# Patient Record
Sex: Male | Born: 1940
Health system: Southern US, Community
[De-identification: ages and names within clinical notes are randomized; demographics above are authoritative.]

## PROBLEM LIST (undated history)

## (undated) DIAGNOSIS — C44300 Unspecified malignant neoplasm of skin of unspecified part of face: Secondary | ICD-10-CM

## (undated) DIAGNOSIS — W3400XA Accidental discharge from unspecified firearms or gun, initial encounter: Secondary | ICD-10-CM

## (undated) DIAGNOSIS — M1990 Unspecified osteoarthritis, unspecified site: Secondary | ICD-10-CM

## (undated) DIAGNOSIS — I6522 Occlusion and stenosis of left carotid artery: Secondary | ICD-10-CM

## (undated) DIAGNOSIS — E119 Type 2 diabetes mellitus without complications: Secondary | ICD-10-CM

## (undated) DIAGNOSIS — L0390 Cellulitis, unspecified: Secondary | ICD-10-CM

## (undated) DIAGNOSIS — E785 Hyperlipidemia, unspecified: Secondary | ICD-10-CM

## (undated) DIAGNOSIS — M79643 Pain in unspecified hand: Secondary | ICD-10-CM

## (undated) DIAGNOSIS — G8929 Other chronic pain: Secondary | ICD-10-CM

## (undated) DIAGNOSIS — I10 Essential (primary) hypertension: Secondary | ICD-10-CM

## (undated) DIAGNOSIS — F419 Anxiety disorder, unspecified: Secondary | ICD-10-CM

## (undated) DIAGNOSIS — K219 Gastro-esophageal reflux disease without esophagitis: Secondary | ICD-10-CM

## (undated) DIAGNOSIS — K635 Polyp of colon: Secondary | ICD-10-CM

## (undated) HISTORY — PX: SKIN CANCER EXCISION: SHX779

## (undated) HISTORY — DX: Polyp of colon: K63.5

## (undated) HISTORY — PX: TONSILLECTOMY: SUR1361

## (undated) HISTORY — DX: Anxiety disorder, unspecified: F41.9

---

## 1979-01-13 HISTORY — PX: FINGER AMPUTATION: SHX636

## 1979-01-13 HISTORY — PX: HAND SURGERY: SHX662

## 1996-01-13 DIAGNOSIS — I10 Essential (primary) hypertension: Secondary | ICD-10-CM

## 1996-01-13 DIAGNOSIS — E785 Hyperlipidemia, unspecified: Secondary | ICD-10-CM

## 1996-01-13 HISTORY — DX: Essential (primary) hypertension: I10

## 1996-01-13 HISTORY — DX: Hyperlipidemia, unspecified: E78.5

## 1997-09-21 ENCOUNTER — Encounter: Admission: RE | Admit: 1997-09-21 | Discharge: 1997-12-20 | Payer: Self-pay | Admitting: Endocrinology

## 1998-09-05 ENCOUNTER — Emergency Department (HOSPITAL_COMMUNITY): Admission: EM | Admit: 1998-09-05 | Discharge: 1998-09-05 | Payer: Self-pay

## 1998-11-01 ENCOUNTER — Emergency Department (HOSPITAL_COMMUNITY): Admission: EM | Admit: 1998-11-01 | Discharge: 1998-11-01 | Payer: Self-pay | Admitting: Emergency Medicine

## 1998-11-02 ENCOUNTER — Encounter: Payer: Self-pay | Admitting: Emergency Medicine

## 2002-10-09 ENCOUNTER — Emergency Department (HOSPITAL_COMMUNITY): Admission: EM | Admit: 2002-10-09 | Discharge: 2002-10-09 | Payer: Self-pay | Admitting: Emergency Medicine

## 2006-12-14 ENCOUNTER — Emergency Department (HOSPITAL_COMMUNITY): Admission: EM | Admit: 2006-12-14 | Discharge: 2006-12-14 | Payer: Self-pay | Admitting: Emergency Medicine

## 2010-02-20 ENCOUNTER — Emergency Department (HOSPITAL_COMMUNITY): Payer: Medicare HMO

## 2010-02-20 ENCOUNTER — Emergency Department (HOSPITAL_COMMUNITY)
Admission: EM | Admit: 2010-02-20 | Discharge: 2010-02-20 | Disposition: A | Discharge status: Home / Self Care | Payer: Medicare HMO | Attending: Emergency Medicine | Admitting: Emergency Medicine

## 2010-02-20 DIAGNOSIS — Z79899 Other long term (current) drug therapy: Secondary | ICD-10-CM | POA: Insufficient documentation

## 2010-02-20 DIAGNOSIS — E119 Type 2 diabetes mellitus without complications: Secondary | ICD-10-CM | POA: Insufficient documentation

## 2010-02-20 DIAGNOSIS — M7989 Other specified soft tissue disorders: Secondary | ICD-10-CM | POA: Insufficient documentation

## 2010-02-20 DIAGNOSIS — M79609 Pain in unspecified limb: Secondary | ICD-10-CM | POA: Insufficient documentation

## 2010-08-03 ENCOUNTER — Emergency Department (HOSPITAL_COMMUNITY)
Admission: EM | Admit: 2010-08-03 | Discharge: 2010-08-03 | Disposition: A | Discharge status: Home / Self Care | Payer: Medicare HMO | Attending: Emergency Medicine | Admitting: Emergency Medicine

## 2010-08-03 ENCOUNTER — Encounter: Payer: Self-pay | Admitting: *Deleted

## 2010-08-03 DIAGNOSIS — L02619 Cutaneous abscess of unspecified foot: Secondary | ICD-10-CM | POA: Insufficient documentation

## 2010-08-03 DIAGNOSIS — L03116 Cellulitis of left lower limb: Secondary | ICD-10-CM

## 2010-08-03 DIAGNOSIS — I10 Essential (primary) hypertension: Secondary | ICD-10-CM | POA: Insufficient documentation

## 2010-08-03 DIAGNOSIS — E119 Type 2 diabetes mellitus without complications: Secondary | ICD-10-CM | POA: Insufficient documentation

## 2010-08-03 DIAGNOSIS — F17200 Nicotine dependence, unspecified, uncomplicated: Secondary | ICD-10-CM | POA: Insufficient documentation

## 2010-08-03 HISTORY — DX: Accidental discharge from unspecified firearms or gun, initial encounter: W34.00XA

## 2010-08-03 HISTORY — DX: Essential (primary) hypertension: I10

## 2010-08-03 MED ORDER — DOXYCYCLINE HYCLATE 100 MG PO TABS
100.0000 mg | ORAL_TABLET | Freq: Once | ORAL | Status: AC
  Administered 2010-08-03: 100 mg via ORAL
  Filled 2010-08-03: qty 1

## 2010-08-03 MED ORDER — DOXYCYCLINE HYCLATE 100 MG PO TABS: 100.0000 mg | ORAL_TABLET | Freq: Two times a day (BID) | ORAL | Status: DC
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
## 2010-08-03 NOTE — ED Notes (Signed)
Pt c/o swelling, redness and pain to left foot x 2 days; pt states he is a diabetic and needs to have it checked out

## 2010-08-09 ENCOUNTER — Inpatient Hospital Stay (HOSPITAL_COMMUNITY)
Admission: EM | Admit: 2010-08-09 | Discharge: 2010-08-11 | DRG: 603 | Disposition: A | Discharge status: Home Health | Payer: Medicare HMO | Attending: Internal Medicine | Admitting: Internal Medicine

## 2010-08-09 ENCOUNTER — Emergency Department (HOSPITAL_COMMUNITY): Payer: Medicare HMO

## 2010-08-09 ENCOUNTER — Encounter (HOSPITAL_COMMUNITY): Payer: Self-pay | Admitting: Emergency Medicine

## 2010-08-09 DIAGNOSIS — F17200 Nicotine dependence, unspecified, uncomplicated: Secondary | ICD-10-CM | POA: Diagnosis present

## 2010-08-09 DIAGNOSIS — E785 Hyperlipidemia, unspecified: Secondary | ICD-10-CM | POA: Diagnosis present

## 2010-08-09 DIAGNOSIS — L02619 Cutaneous abscess of unspecified foot: Principal | ICD-10-CM | POA: Diagnosis present

## 2010-08-09 DIAGNOSIS — L0390 Cellulitis, unspecified: Secondary | ICD-10-CM

## 2010-08-09 DIAGNOSIS — L03116 Cellulitis of left lower limb: Secondary | ICD-10-CM

## 2010-08-09 DIAGNOSIS — I10 Essential (primary) hypertension: Secondary | ICD-10-CM | POA: Diagnosis present

## 2010-08-09 DIAGNOSIS — D72829 Elevated white blood cell count, unspecified: Secondary | ICD-10-CM | POA: Diagnosis present

## 2010-08-09 DIAGNOSIS — E119 Type 2 diabetes mellitus without complications: Secondary | ICD-10-CM | POA: Diagnosis present

## 2010-08-09 HISTORY — DX: Hyperlipidemia, unspecified: E78.5

## 2010-08-09 LAB — BASIC METABOLIC PANEL
BUN: 18 mg/dL (ref 6–23)
CO2: 27 mEq/L (ref 19–32)
Calcium: 10.1 mg/dL (ref 8.4–10.5)
GFR calc non Af Amer: >60 mL/min (ref >60)
Glucose, Bld: 187 mg/dL — ABNORMAL HIGH (ref 70–99)
Potassium: 4.2 mEq/L (ref 3.5–5.1)
Sodium: 137 mEq/L (ref 135–145)

## 2010-08-09 LAB — CBC
HCT: 37.4 % — ABNORMAL LOW (ref 39.0–52.0)
Hemoglobin: 12.5 g/dL — ABNORMAL LOW (ref 13.0–17.0)
MCH: 30.9 pg (ref 26.0–34.0)
MCHC: 33.4 g/dL (ref 30.0–36.0)
RBC: 4.05 MIL/uL — ABNORMAL LOW (ref 4.22–5.81)

## 2010-08-09 LAB — GLUCOSE, CAPILLARY: Glucose-Capillary: 109 mg/dL — ABNORMAL HIGH (ref 70–99)

## 2010-08-09 MED ORDER — INSULIN ASPART 100 UNIT/ML ~~LOC~~ SOLN: 0.0000 [IU] | Freq: Three times a day (TID) | SUBCUTANEOUS | Status: DC

## 2010-08-09 MED ORDER — SODIUM CHLORIDE 0.9 % IV SOLN: INTRAVENOUS | Status: DC

## 2010-08-09 MED ORDER — SENNOSIDES-DOCUSATE SODIUM 8.6-50 MG PO TABS: 1.0000 | ORAL_TABLET | Freq: Every day | ORAL | Status: DC | PRN

## 2010-08-09 MED ORDER — ACETAMINOPHEN 325 MG PO TABS: 650.0000 mg | ORAL_TABLET | Freq: Four times a day (QID) | ORAL | Status: DC | PRN

## 2010-08-09 MED ORDER — ACETAMINOPHEN 650 MG RE SUPP: 650.0000 mg | Freq: Four times a day (QID) | RECTAL | Status: DC | PRN

## 2010-08-09 MED ORDER — VANCOMYCIN HCL IN DEXTROSE 1-5 GM/200ML-% IV SOLN
INTRAVENOUS | Status: AC
  Filled 2010-08-09: qty 200

## 2010-08-09 MED ORDER — PIPERACILLIN-TAZOBACTAM 3.375 G IVPB
3.3750 g | Freq: Once | INTRAVENOUS | Status: AC
  Administered 2010-08-09: 3.375 g via INTRAVENOUS
  Filled 2010-08-09: qty 50

## 2010-08-09 MED ORDER — OXYCODONE-ACETAMINOPHEN 5-325 MG PO TABS
2.0000 | ORAL_TABLET | Freq: Once | ORAL | Status: AC
  Administered 2010-08-09: 2 via ORAL
  Filled 2010-08-09: qty 2

## 2010-08-09 MED ORDER — MORPHINE SULFATE 2 MG/ML IJ SOLN: 1.0000 mg | INTRAMUSCULAR | Status: DC | PRN

## 2010-08-09 MED ORDER — AMPICILLIN-SULBACTAM SODIUM 3 (2-1) G IJ SOLR
3.0000 g | Freq: Four times a day (QID) | INTRAMUSCULAR | Status: DC
  Administered 2010-08-09 – 2010-08-10 (×2): 3 g via INTRAVENOUS
  Filled 2010-08-09: qty 3

## 2010-08-09 MED ORDER — ONDANSETRON HCL 4 MG/2ML IJ SOLN: 4.0000 mg | Freq: Four times a day (QID) | INTRAMUSCULAR | Status: DC | PRN

## 2010-08-09 MED ORDER — SODIUM CHLORIDE 0.9 % IJ SOLN
3.0000 mL | INTRAMUSCULAR | Status: DC | PRN
  Administered 2010-08-10: 3 mL via INTRAVENOUS

## 2010-08-09 MED ORDER — SODIUM CHLORIDE 0.9 % IV SOLN: 250.0000 mL | INTRAVENOUS | Status: DC

## 2010-08-09 MED ORDER — SODIUM CHLORIDE 0.9 % IV SOLN
INTRAVENOUS | Status: AC
  Filled 2010-08-09 (×2): qty 3

## 2010-08-09 MED ORDER — VANCOMYCIN HCL IN DEXTROSE 1-5 GM/200ML-% IV SOLN
1000.0000 mg | Freq: Two times a day (BID) | INTRAVENOUS | Status: DC
  Administered 2010-08-09 – 2010-08-11 (×4): 1000 mg via INTRAVENOUS
  Filled 2010-08-09 (×3): qty 200

## 2010-08-09 MED ORDER — ENOXAPARIN SODIUM 40 MG/0.4ML ~~LOC~~ SOLN
40.0000 mg | Freq: Every day | SUBCUTANEOUS | Status: DC
  Administered 2010-08-09 – 2010-08-10 (×2): 40 mg via SUBCUTANEOUS
  Filled 2010-08-09 (×2): qty 0.4

## 2010-08-09 MED ORDER — FENOFIBRATE 160 MG PO TABS
160.0000 mg | ORAL_TABLET | Freq: Every day | ORAL | Status: DC
  Administered 2010-08-09: 160 mg via ORAL
  Filled 2010-08-09 (×3): qty 1

## 2010-08-09 MED ORDER — PIOGLITAZONE HCL 30 MG PO TABS
45.0000 mg | ORAL_TABLET | Freq: Every day | ORAL | Status: DC
  Administered 2010-08-09: 45 mg via ORAL
  Filled 2010-08-09 (×2): qty 2

## 2010-08-09 MED ORDER — SITAGLIPTIN PHOSPHATE 50 MG PO TABS
50.0000 mg | ORAL_TABLET | Freq: Two times a day (BID) | ORAL | Status: DC
  Administered 2010-08-09 – 2010-08-11 (×3): 50 mg via ORAL
  Filled 2010-08-09 (×9): qty 1

## 2010-08-09 MED ORDER — GLIPIZIDE ER 5 MG PO TB24
5.0000 mg | ORAL_TABLET | Freq: Every day | ORAL | Status: DC
  Administered 2010-08-09: 5 mg via ORAL
  Filled 2010-08-09 (×2): qty 1

## 2010-08-09 MED ORDER — LOSARTAN POTASSIUM 50 MG PO TABS
50.0000 mg | ORAL_TABLET | Freq: Every day | ORAL | Status: DC
  Administered 2010-08-09: 50 mg via ORAL
  Filled 2010-08-09 (×2): qty 1

## 2010-08-09 MED ORDER — VANCOMYCIN HCL IN DEXTROSE 1-5 GM/200ML-% IV SOLN
1000.0000 mg | Freq: Once | INTRAVENOUS | Status: AC
  Administered 2010-08-09: 1000 mg via INTRAVENOUS
  Filled 2010-08-09: qty 200

## 2010-08-09 MED ORDER — ONDANSETRON HCL 4 MG PO TABS: 4.0000 mg | ORAL_TABLET | Freq: Four times a day (QID) | ORAL | Status: DC | PRN

## 2010-08-09 MED ORDER — IOHEXOL 300 MG/ML  SOLN
100.0000 mL | Freq: Once | INTRAMUSCULAR | Status: AC | PRN
  Administered 2010-08-09: 100 mL via INTRAVENOUS

## 2010-08-09 MED ORDER — ASPIRIN 81 MG PO CHEW
81.0000 mg | CHEWABLE_TABLET | Freq: Every day | ORAL | Status: DC
  Administered 2010-08-10 – 2010-08-11 (×2): 81 mg via ORAL
  Filled 2010-08-09 (×2): qty 1

## 2010-08-09 MED ORDER — HYDROCODONE-ACETAMINOPHEN 5-325 MG PO TABS
1.0000 | ORAL_TABLET | Freq: Four times a day (QID) | ORAL | Status: DC | PRN
  Administered 2010-08-09 – 2010-08-10 (×2): 1 via ORAL
  Filled 2010-08-09 (×2): qty 1

## 2010-08-09 MED ORDER — SODIUM CHLORIDE 0.9 % IJ SOLN
3.0000 mL | Freq: Two times a day (BID) | INTRAMUSCULAR | Status: DC
  Administered 2010-08-09 – 2010-08-11 (×5): 3 mL via INTRAVENOUS
  Administered 2010-08-11: 12:00:00 via INTRAVENOUS
  Filled 2010-08-09 (×4): qty 3

## 2010-08-09 MED ORDER — ONDANSETRON HCL 4 MG PO TABS
4.0000 mg | ORAL_TABLET | Freq: Once | ORAL | Status: AC
  Administered 2010-08-09: 4 mg via ORAL
  Filled 2010-08-09: qty 1
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
## 2010-08-09 NOTE — ED Notes (Signed)
3rd floor called to give report on said pt.

## 2010-08-09 NOTE — ED Notes (Signed)
Lunch tray ordered for pt.

## 2010-08-09 NOTE — Progress Notes (Signed)
Pt seen with me by Dr Shela Commons. Case discussed with hospitalist.

## 2010-08-09 NOTE — ED Notes (Signed)
L foot with ? Insect bite that caused cellulitis. In ED on last Sunday.
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
## 2010-08-09 NOTE — Progress Notes (Signed)
Pt given care notes on PNA vaccine, cellulitis, and smoking cessation.  Pt also refuses to take insulin- only wants to take PO meds for diabetes.  Glucose was 222 tonight, pt aware but still refuses. PNA vaccine screening done with pt alone in room.  Pt states he has not had the PNA vaccine and does not want it.  Later on pts wife came in and said that he had the PNA vaccine 2 years ago.  Pt disagrees. Unsure of PNA vaccine status, but pt still refuses to have vaccine given while here.

## 2010-08-10 LAB — CBC
Hemoglobin: 11.3 g/dL — ABNORMAL LOW (ref 13.0–17.0)
MCH: 30.9 pg (ref 26.0–34.0)
Platelets: 246 10*3/uL (ref 150–400)
RBC: 3.66 MIL/uL — ABNORMAL LOW (ref 4.22–5.81)
WBC: 8.2 10*3/uL (ref 4.0–10.5)

## 2010-08-10 LAB — BASIC METABOLIC PANEL
Calcium: 9.1 mg/dL (ref 8.4–10.5)
GFR calc Af Amer: >60 mL/min (ref >60)
GFR calc non Af Amer: >60 mL/min (ref >60)
Glucose, Bld: 158 mg/dL — ABNORMAL HIGH (ref 70–99)
Potassium: 4.1 mEq/L (ref 3.5–5.1)
Sodium: 135 mEq/L (ref 135–145)

## 2010-08-10 LAB — HEMOGLOBIN A1C
Hgb A1c MFr Bld: 6.5 % — ABNORMAL HIGH (ref <5.7)
Mean Plasma Glucose: 140 mg/dL — ABNORMAL HIGH (ref <117)

## 2010-08-10 LAB — GLUCOSE, CAPILLARY
Glucose-Capillary: 225 mg/dL — ABNORMAL HIGH (ref 70–99)
Glucose-Capillary: 150 mg/dL — ABNORMAL HIGH (ref 70–99)

## 2010-08-10 MED ORDER — FENOFIBRATE 160 MG PO TABS: 160.0000 mg | ORAL_TABLET | Freq: Every day | ORAL | Status: DC

## 2010-08-10 MED ORDER — PIOGLITAZONE HCL 30 MG PO TABS
45.0000 mg | ORAL_TABLET | ORAL | Status: DC
  Administered 2010-08-10: 45 mg via ORAL

## 2010-08-10 MED ORDER — NICOTINE 14 MG/24HR TD PT24
14.0000 mg | MEDICATED_PATCH | Freq: Every day | TRANSDERMAL | Status: DC | PRN
  Administered 2010-08-10: 14 mg via TRANSDERMAL
  Filled 2010-08-10: qty 1

## 2010-08-10 MED ORDER — OXYCODONE HCL 5 MG PO TABS
5.0000 mg | ORAL_TABLET | Freq: Four times a day (QID) | ORAL | Status: DC | PRN
  Administered 2010-08-10 – 2010-08-11 (×5): 5 mg via ORAL
  Filled 2010-08-10 (×5): qty 1

## 2010-08-10 MED ORDER — DEXTROSE 5 % IV SOLN
1.0000 g | INTRAVENOUS | Status: DC
  Administered 2010-08-10 – 2010-08-11 (×2): 1 g via INTRAVENOUS
  Filled 2010-08-10 (×3): qty 1

## 2010-08-10 MED ORDER — SODIUM CHLORIDE 0.9 % IJ SOLN
INTRAMUSCULAR | Status: AC
  Administered 2010-08-10: 3 mL via INTRAVENOUS
  Filled 2010-08-10: qty 3

## 2010-08-10 MED ORDER — SODIUM CHLORIDE 0.9 % IJ SOLN
INTRAMUSCULAR | Status: AC
  Administered 2010-08-10: 3 mL
  Filled 2010-08-10: qty 10

## 2010-08-10 MED ORDER — FENOFIBRATE 160 MG PO TABS
160.0000 mg | ORAL_TABLET | ORAL | Status: DC
  Administered 2010-08-10: 160 mg via ORAL
  Filled 2010-08-10 (×3): qty 1

## 2010-08-10 MED ORDER — LOSARTAN POTASSIUM 50 MG PO TABS
50.0000 mg | ORAL_TABLET | ORAL | Status: DC
  Administered 2010-08-10: 50 mg via ORAL

## 2010-08-10 MED ORDER — GLIPIZIDE ER 5 MG PO TB24
5.0000 mg | ORAL_TABLET | ORAL | Status: DC
  Administered 2010-08-10: 5 mg via ORAL

## 2010-08-10 NOTE — Progress Notes (Signed)
Subjective: Feeling better today. Redness in his left foot improved today. Pain improved in his left foot.  Objective: Vital signs in last 24 hours: Filed Vitals:   08/09/10 0928 08/09/10 1500 08/09/10 2200 08/10/10 0630  BP: 133/64 136/72 127/62 121/72  Pulse: 101 87 78 69  Temp: 97.7 F (36.5 C) 98.1 F (36.7 C) 98 F (36.7 C) 97.7 F (36.5 C)  TempSrc: Oral Oral Oral Oral  Resp: 16 18 20 20   Height: 5\' 11"  (1.803 m) 5\' 11"  (1.803 m)    Weight: 69.4 kg (153 lb) 70.1 kg (154 lb 8.7 oz)  71.4 kg (157 lb 6.5 oz)  SpO2: 100%  90% 97%   Weight change:   Intake/Output Summary (Last 24 hours) at 08/10/10 0804 Last data filed at 08/10/10 0600  Gross per 24 hour  Intake    483 ml  Output   1900 ml  Net  -1417 ml   Physical Exam: General: Awake, Oriented, No acute distress. HEENT: EOMI. Neck: Supple CV: S1 and S2 Lungs: Clear to ascultation bilaterally Abdomen: Soft, Nontender, Nondistended, +bowel sounds. Ext: Good pulses. Trace edema. Erythema improved over the dorsal foot.    Lab Results:  Basename 08/10/10 0433 08/09/10 1035  NA 135 137  K 4.1 4.2  CL 102 101  CO2 21 27  GLUCOSE 158* 187*  BUN 15 18  CREATININE 0.90 0.92  CALCIUM 9.1 10.1  MG -- --  PHOS -- --   No results found for this basename: AST:2,ALT:2,ALKPHOS:2,BILITOT:2,PROT:2,ALBUMIN:2 in the last 72 hours No results found for this basename: LIPASE:2,AMYLASE:2 in the last 72 hours  Basename 08/10/10 0433 08/09/10 1035  WBC 8.2 10.9*  NEUTROABS -- --  HGB 11.3* 12.5*  HCT 33.5* 37.4*  MCV 91.5 92.3  PLT 246 254   No results found for this basename: CKTOTAL:3,CKMB:3,CKMBINDEX:3,TROPONINI:3 in the last 72 hours No results found for this basename: POCBNP:3 in the last 72 hours No results found for this basename: DDIMER:2 in the last 72 hours No results found for this basename: HGBA1C:2 in the last 72 hours No results found for this basename: CHOL:2,HDL:2,LDLCALC:2,TRIG:2,CHOLHDL:2,LDLDIRECT:2 in  the last 72 hours No results found for this basename: TSH,T4TOTAL,FREET3,T3FREE,THYROIDAB in the last 72 hours No results found for this basename: VITAMINB12:2,FOLATE:2,FERRITIN:2,TIBC:2,IRON:2,RETICCTPCT:2 in the last 72 hours Micro Results: Recent Results (from the past 240 hour(s))  CULTURE, BLOOD (ROUTINE X 2)     Status: Normal (Preliminary result)   Collection Time   08/09/10  1:19 PM      Component Value Range Status Comment   Specimen Description BLOOD RIGHT HAND   Final    Special Requests     Final    Value: BOTTLES DRAWN AEROBIC AND ANAEROBIC 12CC EACH BOTTLE   Culture NO GROWTH 1 DAY   Final    Report Status PENDING   Incomplete   CULTURE, BLOOD (ROUTINE X 2)     Status: Normal (Preliminary result)   Collection Time   08/09/10  1:24 PM      Component Value Range Status Comment   Specimen Description BLOOD RIGHT ANTECUBITAL   Final    Special Requests     Final    Value: BOTTLES DRAWN AEROBIC AND ANAEROBIC 15CC EACH BOTTLE   Culture NO GROWTH 1 DAY   Final    Report Status PENDING   Incomplete    Studies/Results: Ct Foot Left W Contrast  08/09/2010  *RADIOLOGY REPORT*  Clinical Data: Cellulitis.  Redness and swelling.  CT OF THE LEFT  FOOT WITH CONTRAST  Technique:  Multidetector CT imaging was performed following the standard protocol during bolus administration of intravenous contrast.  Contrast: 100 ml Omnipaque-300  Comparison: Radiographs dated 08/09/2010  Findings: There is extensive soft tissue edema circumferentially at the ankle joint and extending primarily onto the dorsum of the foot.  There are no discrete soft tissue abscesses.  The underlying muscle structures are normal.  The osseous structures are normal.  No visible joint effusions.  IMPRESSION: Diffuse subcutaneous edema consistent with cellulitis.  No evidence of soft tissue abscesses, osteomyelitis, or myositis.  Original Report Authenticated By: Gwynn Burly, M.D.   Dg Foot Complete Left  08/09/2010   *RADIOLOGY REPORT*  Clinical Data: Pain.  Swelling.  Cellulitis.  LEFT FOOT - COMPLETE 3+ VIEW  Comparison: None  Findings: There is dorsal soft tissue swelling.  No radiopaque foreign object.  No osseous or articular abnormality.  IMPRESSION: Soft tissue swelling dorsally.  No other abnormality otherwise.  Original Report Authenticated By: Thomasenia Sales, M.D.   Medications: I have reviewed the patient's current medications. Scheduled Meds:   . aspirin  81 mg Oral Daily  . cefTRIAXone (ROCEPHIN) IV  1 g Intravenous Q24H  . enoxaparin  40 mg Subcutaneous Daily  . fenofibrate  160 mg Oral Daily  . glipiZIDE  5 mg Oral Daily  . insulin aspart  0-9 Units Subcutaneous TID WC  . losartan  50 mg Oral Daily  . ondansetron  4 mg Oral Once  . oxyCODONE-acetaminophen  2 tablet Oral Once  . pioglitazone  45 mg Oral Daily  . piperacillin-tazobactam  3.375 g Intravenous Once  . sitaGLIPtin  50 mg Oral BID  . sodium chloride  3 mL Intravenous Q12H  . sodium chloride      . vancomycin  1,000 mg Intravenous Once  . vancomycin  1,000 mg Intravenous Q12H  . DISCONTD: ampicillin-sulbactam (UNASYN) IV  3 g Intravenous Q6H   Continuous Infusions:   . sodium chloride    . DISCONTD: sodium chloride     PRN Meds:.acetaminophen, acetaminophen, iohexol, morphine, nicotine, ondansetron (ZOFRAN) IV, ondansetron, oxyCODONE, senna-docusate, sodium chloride, DISCONTD: HYDROcodone-acetaminophen Assessment/Plan: 1. Left dorsal foot cellulitis. Continue vancomycin. Unasyn changed to ceftriaxone. IV antibiotics since 08/09/2010. CT of Left foot as indicated above does not show any evidence of soft tissue abscesses, osteomyelitis, or myositis. Ordered PICC line placement. 2. Hypertension stable continue home medications at this time.  3. Type 2 diabetes continue home medications except metformin. On sliding scale insulin. Blood sugar stable. 4. Hyperlipidemia. Continue the patient on home medications.  5. Leukocytosis.  Likely due to cellulitis. Result. 6. Prophylaxis Lovenox for DVT prophylaxis.  7. CODE STATUS patient is full code. 8. Disposition. Consider possible discharge tomorrow after home IV antibiotics are arranged after PICC line is placed.   LOS: 1 day   Rigel Filsinger A 08/10/2010, 8:04 AM
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
## 2010-08-11 ENCOUNTER — Inpatient Hospital Stay (HOSPITAL_COMMUNITY): Payer: Medicare HMO

## 2010-08-11 ENCOUNTER — Encounter (HOSPITAL_COMMUNITY): Payer: Medicare HMO

## 2010-08-11 LAB — GLUCOSE, CAPILLARY
Glucose-Capillary: 157 mg/dL — ABNORMAL HIGH (ref 70–99)
Glucose-Capillary: 240 mg/dL — ABNORMAL HIGH (ref 70–99)

## 2010-08-11 LAB — VANCOMYCIN, TROUGH: Vancomycin Tr: 18.8 ug/mL (ref 10.0–20.0)

## 2010-08-11 MED ORDER — NICOTINE 14 MG/24HR TD PT24: 1.0000 | MEDICATED_PATCH | Freq: Every day | TRANSDERMAL | Status: AC | PRN

## 2010-08-11 MED ORDER — OXYCODONE HCL 5 MG PO TABS: 5.0000 mg | ORAL_TABLET | Freq: Four times a day (QID) | ORAL | Status: AC | PRN

## 2010-08-11 MED ORDER — HEPARIN SOD (PORK) LOCK FLUSH 100 UNIT/ML IV SOLN
250.0000 [IU] | Freq: Once | INTRAVENOUS | Status: AC
  Administered 2010-08-11: 12:00:00
  Filled 2010-08-11: qty 5

## 2010-08-11 MED ORDER — SODIUM CHLORIDE 0.9 % IJ SOLN: 10.0000 mL | INTRAMUSCULAR | Status: DC | PRN

## 2010-08-11 MED ORDER — VANCOMYCIN HCL IN DEXTROSE 1-5 GM/200ML-% IV SOLN
1000.0000 mg | Freq: Once | INTRAVENOUS | Status: DC
  Filled 2010-08-11: qty 200

## 2010-08-11 MED ORDER — SODIUM CHLORIDE 0.9 % IJ SOLN
10.0000 mL | Freq: Two times a day (BID) | INTRAMUSCULAR | Status: DC
  Administered 2010-08-10: 3 mL

## 2010-08-11 MED ORDER — VANCOMYCIN HCL IN DEXTROSE 1-5 GM/200ML-% IV SOLN: 1750.0000 mg | INTRAVENOUS | Status: DC

## 2010-08-11 MED ORDER — SODIUM CHLORIDE 0.9 % IJ SOLN
INTRAMUSCULAR | Status: AC
  Filled 2010-08-11: qty 10

## 2010-08-11 MED ORDER — VANCOMYCIN HCL IN DEXTROSE 1-5 GM/200ML-% IV SOLN: 1000.0000 mg | Freq: Two times a day (BID) | INTRAVENOUS | Status: DC

## 2010-08-11 NOTE — Discharge Summary (Addendum)
Discharge Summary  JAKIM DRAPEAU  MR#: [id number]  DOB:March 04, 1940  Date of Admission: 08/09/2010 Date of Discharge: 08/11/2010  Attending Physician:Malachi Suderman A  Patient's PCP: Dwana Melena, MD  Discharge Diagnoses: 1. Left dorsal foot cellulitis.  2. Hypertension.  3. Type 2 diabetes.  4. Hyperlipidemia.  5. Leukocytosis.  6. Tobacco use.  Discharge Medications Medication List  As of 08/11/2010 10:05 AM   START taking these medications         nicotine 14 mg/24hr patch   Commonly known as: NICODERM CQ - dosed in mg/24 hours   Place 1 patch onto the skin daily as needed (For smoking cessation.).      oxyCODONE 5 MG immediate release tablet   Commonly known as: Oxy IR/ROXICODONE   Take 1-2 tablets (5-10 mg total) by mouth every 6 (six) hours as needed.      vancomycin 1 GM/200ML Soln   Commonly known as: VANCOCIN   Inject 350 mLs (1,750 mg total) into the vein daily.         CONTINUE taking these medications         aspirin 81 MG tablet      fenofibrate 145 MG tablet   Commonly known as: TRICOR      glipiZIDE 5 MG 24 hr tablet   Commonly known as: GLUCOTROL XL      losartan 50 MG tablet   Commonly known as: COZAAR      pioglitazone 45 MG tablet   Commonly known as: ACTOS      sitaGLIPtan-metformin 50-1000 MG per tablet   Commonly known as: JANUMET          Where to get your medications    These are the prescriptions that you need to pick up.   You may get these medications from any pharmacy.         nicotine 14 mg/24hr patch         Information on where to get these meds is not yet available. Ask your nurse or doctor.         oxyCODONE 5 MG immediate release tablet   vancomycin 1 GM/200ML Soln            Hospital Course: 1. Left dorsal foot cellulitis. Usually the patient was started on vancomycin and Unasyn and also hospital stay Unasyn was discontinued and patient was transitioned to the ceftriaxone. IV antibiotics since 08/09/2010. CT of  Left foot does not show any evidence of soft tissue abscesses, osteomyelitis, or myositis.PICC line placed. Will continue only vancomycin after discharged today.  2. Hypertension stable continue home medications at this time.  3. Type 2 diabetes continue home medications resume metformin. On sliding scale insulin. Blood sugar stable.  4. Hyperlipidemia. Continue the patient on home medications.  5. Leukocytosis. Likely due to cellulitis. Result.  6. Prophylaxis Lovenox for DVT prophylaxis.  7. Tobacco use. At discharge will prescribe PRN nicotine patches. Encourage smoking cessation.  Day of Discharge BP 128/70  Pulse 82  Temp(Src) 98 F (36.7 C) (Oral)  Resp 18  Ht 5\' 11"  (1.803 m)  Wt 68.5 kg (151 lb 0.2 oz)  BMI 21.06 kg/m2  SpO2 98%   Results for orders placed during the hospital encounter of 08/09/10 (from the past 48 hour(s))  CBC     Status: Abnormal   Collection Time   08/09/10 10:35 AM      Component Value Range Comment   WBC 10.9 (*) 4.0 - 10.5 (K/uL)    RBC 4.05 (*)  4.22 - 5.81 (MIL/uL)    Hemoglobin 12.5 (*) 13.0 - 17.0 (g/dL)    HCT 60.4 (*) 54.0 - 52.0 (%)    MCV 92.3  78.0 - 100.0 (fL)    MCH 30.9  26.0 - 34.0 (pg)    MCHC 33.4  30.0 - 36.0 (g/dL)    RDW 98.1  19.1 - 47.8 (%)    Platelets 254  150 - 400 (K/uL)   BASIC METABOLIC PANEL     Status: Abnormal   Collection Time   08/09/10 10:35 AM      Component Value Range Comment   Sodium 137  135 - 145 (mEq/L)    Potassium 4.2  3.5 - 5.1 (mEq/L)    Chloride 101  96 - 112 (mEq/L)    CO2 27  19 - 32 (mEq/L)    Glucose, Bld 187 (*) 70 - 99 (mg/dL)    BUN 18  6 - 23 (mg/dL)    Creatinine, Ser 2.95  0.50 - 1.35 (mg/dL)    Calcium 62.1  8.4 - 10.5 (mg/dL)    GFR calc non Af Amer >60  >60 (mL/min)    GFR calc Af Amer >60  >60 (mL/min)   CULTURE, BLOOD (ROUTINE X 2)     Status: Normal (Preliminary result)   Collection Time   08/09/10  1:19 PM      Component Value Range Comment   Specimen Description BLOOD RIGHT  HAND      Special Requests        Value: BOTTLES DRAWN AEROBIC AND ANAEROBIC 12CC EACH BOTTLE   Culture NO GROWTH 1 DAY      Report Status PENDING     CULTURE, BLOOD (ROUTINE X 2)     Status: Normal (Preliminary result)   Collection Time   08/09/10  1:24 PM      Component Value Range Comment   Specimen Description BLOOD RIGHT ANTECUBITAL      Special Requests        Value: BOTTLES DRAWN AEROBIC AND ANAEROBIC 15CC EACH BOTTLE   Culture NO GROWTH 1 DAY      Report Status PENDING     GLUCOSE, CAPILLARY     Status: Abnormal   Collection Time   08/09/10  4:27 PM      Component Value Range Comment   Glucose-Capillary 222 (*) 70 - 99 (mg/dL)    Comment 1 Notify RN      Comment 2 Documented in Chart     GLUCOSE, CAPILLARY     Status: Abnormal   Collection Time   08/09/10 10:59 PM      Component Value Range Comment   Glucose-Capillary 109 (*) 70 - 99 (mg/dL)   BASIC METABOLIC PANEL     Status: Abnormal   Collection Time   08/10/10  4:33 AM      Component Value Range Comment   Sodium 135  135 - 145 (mEq/L)    Potassium 4.1  3.5 - 5.1 (mEq/L)    Chloride 102  96 - 112 (mEq/L)    CO2 21  19 - 32 (mEq/L)    Glucose, Bld 158 (*) 70 - 99 (mg/dL)    BUN 15  6 - 23 (mg/dL)    Creatinine, Ser 3.08  0.50 - 1.35 (mg/dL)    Calcium 9.1  8.4 - 10.5 (mg/dL)    GFR calc non Af Amer >60  >60 (mL/min)    GFR calc Af Amer >60  >60 (mL/min)  CBC     Status: Abnormal   Collection Time   08/10/10  4:33 AM      Component Value Range Comment   WBC 8.2  4.0 - 10.5 (K/uL)    RBC 3.66 (*) 4.22 - 5.81 (MIL/uL)    Hemoglobin 11.3 (*) 13.0 - 17.0 (g/dL)    HCT 40.9 (*) 81.1 - 52.0 (%)    MCV 91.5  78.0 - 100.0 (fL)    MCH 30.9  26.0 - 34.0 (pg)    MCHC 33.7  30.0 - 36.0 (g/dL)    RDW 91.4  78.2 - 95.6 (%)    Platelets 246  150 - 400 (K/uL)   HEMOGLOBIN A1C     Status: Abnormal   Collection Time   08/10/10  4:33 AM      Component Value Range Comment   Hemoglobin A1C 6.5 (*) <5.7 (%)    Mean Plasma  Glucose 140 (*) <117 (mg/dL)   GLUCOSE, CAPILLARY     Status: Abnormal   Collection Time   08/10/10  7:33 AM      Component Value Range Comment   Glucose-Capillary 225 (*) 70 - 99 (mg/dL)   GLUCOSE, CAPILLARY     Status: Abnormal   Collection Time   08/10/10 11:22 AM      Component Value Range Comment   Glucose-Capillary 150 (*) 70 - 99 (mg/dL)   GLUCOSE, CAPILLARY     Status: Abnormal   Collection Time   08/10/10  4:36 PM      Component Value Range Comment   Glucose-Capillary 262 (*) 70 - 99 (mg/dL)   GLUCOSE, CAPILLARY     Status: Abnormal   Collection Time   08/10/10  8:54 PM      Component Value Range Comment   Glucose-Capillary 182 (*) 70 - 99 (mg/dL)   VANCOMYCIN, TROUGH     Status: Normal   Collection Time   08/11/10  6:38 AM      Component Value Range Comment   Vancomycin Tr 18.8  10.0 - 20.0 (ug/mL)   GLUCOSE, CAPILLARY     Status: Abnormal   Collection Time   08/11/10  7:58 AM      Component Value Range Comment   Glucose-Capillary 157 (*) 70 - 99 (mg/dL)    Comment 1 Notify RN      Comment 2 Documented in Chart      Disposition: Discharge home with home health for IV antibiotics.  Diet: Modified diet.  Activity: Resume as tolerated.   Follow-up Appts: Discharge Orders    Future Appointments: Provider: Department: Dept Phone: Center:   08/11/2010 9:45 AM Ap-Medsurg Nurse Ap-Medsurg Day  None     Future Orders Please Complete By Expires   Diet Carb Modified      Increase activity slowly      Discharge instructions      Comments:   Please follow up with [facility], MD in 1 week.     Time spent on discharge, talking to the patient, and coordinating care: 40 minutes.  Signed: Burnice Vassel A 08/11/2010, 9:26 AM

## 2010-08-11 NOTE — Progress Notes (Signed)
UR Chart Review Completed  

## 2010-08-11 NOTE — Progress Notes (Signed)
Subjective: Feeling better today. Pain improved over the left dorsal foot today. No specific complaints.  Objective: Vital signs in last 24 hours: Filed Vitals:   08/10/10 0630 08/10/10 1344 08/10/10 2200 08/11/10 0623  BP: 121/72 135/66 116/56 128/70  Pulse: 69 76 72 82  Temp: 97.7 F (36.5 C) 98.4 F (36.9 C) 98.5 F (36.9 C) 98 F (36.7 C)  TempSrc: Oral Oral Oral Oral  Resp: 20 18 20 18   Height:      Weight: 71.4 kg (157 lb 6.5 oz)   68.5 kg (151 lb 0.2 oz)  SpO2: 97% 97% 97% 98%   Weight change: -0.9 kg (-1 lb 15.8 oz)  Intake/Output Summary (Last 24 hours) at 08/11/10 0856 Last data filed at 08/11/10 0000  Gross per 24 hour  Intake   1170 ml  Output   1250 ml  Net    -80 ml   Physical Exam: General: Awake, Oriented, No acute distress. HEENT: EOMI. Neck: Supple CV: S1 and S2 Lungs: Clear to ascultation bilaterally Abdomen: Soft, Nontender, Nondistended, +bowel sounds. Ext: Good pulses. Trace edema. Erythema improved over the dorsal foot.    Lab Results:  Basename 08/10/10 0433 08/09/10 1035  NA 135 137  K 4.1 4.2  CL 102 101  CO2 21 27  GLUCOSE 158* 187*  BUN 15 18  CREATININE 0.90 0.92  CALCIUM 9.1 10.1  MG -- --  PHOS -- --   No results found for this basename: AST:2,ALT:2,ALKPHOS:2,BILITOT:2,PROT:2,ALBUMIN:2 in the last 72 hours No results found for this basename: LIPASE:2,AMYLASE:2 in the last 72 hours  Basename 08/10/10 0433 08/09/10 1035  WBC 8.2 10.9*  NEUTROABS -- --  HGB 11.3* 12.5*  HCT 33.5* 37.4*  MCV 91.5 92.3  PLT 246 254   No results found for this basename: CKTOTAL:3,CKMB:3,CKMBINDEX:3,TROPONINI:3 in the last 72 hours No results found for this basename: POCBNP:3 in the last 72 hours No results found for this basename: DDIMER:2 in the last 72 hours  Basename 08/10/10 0433  HGBA1C 6.5*   No results found for this basename: CHOL:2,HDL:2,LDLCALC:2,TRIG:2,CHOLHDL:2,LDLDIRECT:2 in the last 72 hours No results found for this  basename: TSH,T4TOTAL,FREET3,T3FREE,THYROIDAB in the last 72 hours No results found for this basename: VITAMINB12:2,FOLATE:2,FERRITIN:2,TIBC:2,IRON:2,RETICCTPCT:2 in the last 72 hours Micro Results: Recent Results (from the past 240 hour(s))  CULTURE, BLOOD (ROUTINE X 2)     Status: Normal (Preliminary result)   Collection Time   08/09/10  1:19 PM      Component Value Range Status Comment   Specimen Description BLOOD RIGHT HAND   Final    Special Requests     Final    Value: BOTTLES DRAWN AEROBIC AND ANAEROBIC 12CC EACH BOTTLE   Culture NO GROWTH 1 DAY   Final    Report Status PENDING   Incomplete   CULTURE, BLOOD (ROUTINE X 2)     Status: Normal (Preliminary result)   Collection Time   08/09/10  1:24 PM      Component Value Range Status Comment   Specimen Description BLOOD RIGHT ANTECUBITAL   Final    Special Requests     Final    Value: BOTTLES DRAWN AEROBIC AND ANAEROBIC 15CC EACH BOTTLE   Culture NO GROWTH 1 DAY   Final    Report Status PENDING   Incomplete    Studies/Results: Ct Foot Left W Contrast  08/09/2010  *RADIOLOGY REPORT*  Clinical Data: Cellulitis.  Redness and swelling.  CT OF THE LEFT FOOT WITH CONTRAST  Technique:  Multidetector CT  imaging was performed following the standard protocol during bolus administration of intravenous contrast.  Contrast: 100 ml Omnipaque-300  Comparison: Radiographs dated 08/09/2010  Findings: There is extensive soft tissue edema circumferentially at the ankle joint and extending primarily onto the dorsum of the foot.  There are no discrete soft tissue abscesses.  The underlying muscle structures are normal.  The osseous structures are normal.  No visible joint effusions.  IMPRESSION: Diffuse subcutaneous edema consistent with cellulitis.  No evidence of soft tissue abscesses, osteomyelitis, or myositis.  Original Report Authenticated By: Gwynn Burly, M.D.   Dg Foot Complete Left  08/09/2010  *RADIOLOGY REPORT*  Clinical Data: Pain.   Swelling.  Cellulitis.  LEFT FOOT - COMPLETE 3+ VIEW  Comparison: None  Findings: There is dorsal soft tissue swelling.  No radiopaque foreign object.  No osseous or articular abnormality.  IMPRESSION: Soft tissue swelling dorsally.  No other abnormality otherwise.  Original Report Authenticated By: Thomasenia Sales, M.D.   Medications: I have reviewed the patient's current medications. Scheduled Meds:    . aspirin  81 mg Oral Daily  . cefTRIAXone (ROCEPHIN) IV  1 g Intravenous Q24H  . enoxaparin  40 mg Subcutaneous Daily  . fenofibrate  160 mg Oral Q24H  . glipiZIDE  5 mg Oral Q24H  . insulin aspart  0-9 Units Subcutaneous TID WC  . losartan  50 mg Oral Q24H  . pioglitazone  45 mg Oral Q24H  . sitaGLIPtin  50 mg Oral BID  . sodium chloride  3 mL Intravenous Q12H  . sodium chloride      . vancomycin  1,000 mg Intravenous Q12H  . DISCONTD: fenofibrate  160 mg Oral Daily  . DISCONTD: fenofibrate  160 mg Oral Daily  . DISCONTD: glipiZIDE  5 mg Oral Daily  . DISCONTD: losartan  50 mg Oral Daily  . DISCONTD: pioglitazone  45 mg Oral Daily   Continuous Infusions:    . sodium chloride     PRN Meds:.acetaminophen, acetaminophen, morphine, nicotine, ondansetron (ZOFRAN) IV, ondansetron, oxyCODONE, senna-docusate, sodium chloride Assessment/Plan: 1. Left dorsal foot cellulitis. Continue vancomycin. Unasyn changed to ceftriaxone. IV antibiotics since 08/09/2010. CT of Left foot as indicated above does not show any evidence of soft tissue abscesses, osteomyelitis, or myositis. Ordered PICC line placement. Will continue only vancomycin after discharged today. 2. Hypertension stable continue home medications at this time.  3. Type 2 diabetes continue home medications resume metformin. On sliding scale insulin. Blood sugar stable. 4. Hyperlipidemia. Continue the patient on home medications.  5. Leukocytosis. Likely due to cellulitis. Result. 6. Prophylaxis Lovenox for DVT prophylaxis.  7. CODE  STATUS patient is full code. 8. Disposition. Discharge today after home IV antibiotics are arranged after PICC line is placed.   LOS: 2 days   [name] [name] 08/11/2010, 8:56 AM
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
## 2010-08-11 NOTE — Progress Notes (Signed)
picc line flushed,states understanding of discharge.

## 2010-08-14 LAB — CULTURE, BLOOD (ROUTINE X 2): Culture: NO GROWTH

## 2010-08-18 NOTE — Progress Notes (Signed)
Encounter addended by: Ree Shay, RN on: 08/18/2010 12:43 PM<BR>     Documentation filed: Charges VN

## 2010-08-22 ENCOUNTER — Encounter (HOSPITAL_COMMUNITY): Payer: Self-pay | Admitting: *Deleted

## 2010-08-22 ENCOUNTER — Emergency Department (HOSPITAL_COMMUNITY)
Admission: EM | Admit: 2010-08-22 | Discharge: 2010-08-22 | Disposition: A | Discharge status: Home / Self Care | Payer: Medicare HMO | Attending: Emergency Medicine | Admitting: Emergency Medicine

## 2010-08-22 DIAGNOSIS — I10 Essential (primary) hypertension: Secondary | ICD-10-CM | POA: Insufficient documentation

## 2010-08-22 DIAGNOSIS — F17200 Nicotine dependence, unspecified, uncomplicated: Secondary | ICD-10-CM | POA: Insufficient documentation

## 2010-08-22 DIAGNOSIS — Z452 Encounter for adjustment and management of vascular access device: Secondary | ICD-10-CM

## 2010-08-22 DIAGNOSIS — E119 Type 2 diabetes mellitus without complications: Secondary | ICD-10-CM | POA: Insufficient documentation

## 2010-08-22 DIAGNOSIS — Z79899 Other long term (current) drug therapy: Secondary | ICD-10-CM | POA: Insufficient documentation
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
## 2010-08-22 NOTE — ED Notes (Signed)
Pt here with Rx copy from Dr. Margo Aye to have PICC line removed.  Denies pain or injury to area.  States it's ready to come out.

## 2010-08-22 NOTE — ED Notes (Signed)
Pt has been assessed and treated by EDP prior to nurse to room. Pt now up for discharge.

## 2010-09-11 NOTE — Progress Notes (Signed)
Encounter addended by: Clarene Critchley on: 09/11/2010  7:59 AM<BR>     Documentation filed: Flowsheet VN

## 2011-06-10 ENCOUNTER — Encounter (HOSPITAL_COMMUNITY): Payer: Self-pay | Admitting: Emergency Medicine

## 2011-06-10 ENCOUNTER — Emergency Department (HOSPITAL_COMMUNITY)
Admission: EM | Admit: 2011-06-10 | Discharge: 2011-06-10 | Disposition: A | Discharge status: Home / Self Care | Payer: Medicare Other | Attending: Emergency Medicine | Admitting: Emergency Medicine

## 2011-06-10 ENCOUNTER — Emergency Department (HOSPITAL_COMMUNITY): Payer: Medicare Other

## 2011-06-10 DIAGNOSIS — I10 Essential (primary) hypertension: Secondary | ICD-10-CM | POA: Insufficient documentation

## 2011-06-10 DIAGNOSIS — R002 Palpitations: Secondary | ICD-10-CM | POA: Insufficient documentation

## 2011-06-10 DIAGNOSIS — F411 Generalized anxiety disorder: Secondary | ICD-10-CM | POA: Insufficient documentation

## 2011-06-10 DIAGNOSIS — E119 Type 2 diabetes mellitus without complications: Secondary | ICD-10-CM | POA: Insufficient documentation

## 2011-06-10 DIAGNOSIS — R5383 Other fatigue: Secondary | ICD-10-CM | POA: Insufficient documentation

## 2011-06-10 DIAGNOSIS — R5381 Other malaise: Secondary | ICD-10-CM | POA: Insufficient documentation

## 2011-06-10 DIAGNOSIS — E785 Hyperlipidemia, unspecified: Secondary | ICD-10-CM | POA: Insufficient documentation

## 2011-06-10 DIAGNOSIS — F419 Anxiety disorder, unspecified: Secondary | ICD-10-CM

## 2011-06-10 LAB — BASIC METABOLIC PANEL
BUN: 21 mg/dL (ref 6–23)
CO2: 25 mEq/L (ref 19–32)
Calcium: 9.5 mg/dL (ref 8.4–10.5)
Creatinine, Ser: 1.12 mg/dL (ref 0.50–1.35)
Glucose, Bld: 131 mg/dL — ABNORMAL HIGH (ref 70–99)

## 2011-06-10 LAB — DIFFERENTIAL
Basophils Absolute: 0.1 10*3/uL (ref 0.0–0.1)
Eosinophils Relative: 2 % (ref 0–5)
Lymphocytes Relative: 31 % (ref 12–46)
Monocytes Absolute: 0.8 10*3/uL (ref 0.1–1.0)
Monocytes Relative: 8 % (ref 3–12)

## 2011-06-10 LAB — CBC
HCT: 35.1 % — ABNORMAL LOW (ref 39.0–52.0)
MCV: 90.7 fL (ref 78.0–100.0)
RDW: 14.1 % (ref 11.5–15.5)
WBC: 9.8 10*3/uL (ref 4.0–10.5)

## 2011-06-10 LAB — TROPONIN I: Troponin I: <0.3 ng/mL (ref <0.30)
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
## 2011-06-10 NOTE — Discharge Instructions (Signed)
Palpitations  A palpitation is the feeling that your heartbeat is irregular or is faster than normal. Although this is frightening, it usually is not serious. Palpitations may be caused by excesses of smoking, caffeine, or alcohol. They are also brought on by stress and anxiety. Sometimes, they are caused by heart disease. Unless otherwise noted, your caregiver did not find any signs of serious illness at this time. HOME CARE INSTRUCTIONS  To help prevent palpitations:  Drink decaffeinated coffee, tea, and soda pop. Avoid chocolate.   If you smoke or drink alcohol, quit or cut down as much as possible.   Reduce your stress or anxiety level. Biofeedback, yoga, or meditation will help you relax. Physical activity such as swimming, jogging, or walking also may be helpful.  SEEK MEDICAL CARE IF:   You continue to have a fast heartbeat.   Your palpitations occur more often.  SEEK IMMEDIATE MEDICAL CARE IF: You develop chest pain, shortness of breath, severe headache, dizziness, or fainting. Document Released: 12/27/1999 Document Revised: 12/18/2010 Document Reviewed: 02/25/2007 [employer] Patient Information 2012 [employer].Anxiety and Panic Attacks Your caregiver has informed you that you are having an anxiety or panic attack. There may be many forms of this. Most of the time these attacks come suddenly and without warning. They come at any time of day, including periods of sleep, and at any time of life. They may be strong and unexplained. Although panic attacks are very scary, they are physically harmless. Sometimes the cause of your anxiety is not known. Anxiety is a protective mechanism of the body in its fight or flight mechanism. Most of these perceived danger situations are actually nonphysical situations (such as anxiety over losing a job). CAUSES  The causes of an anxiety or panic attack are many. Panic attacks may occur in otherwise healthy people given a certain set of circumstances.  There may be a genetic cause for panic attacks. Some medications may also have anxiety as a side effect. SYMPTOMS  Some of the most common feelings are:  Intense terror.   Dizziness, feeling faint.   Hot and cold flashes.   Fear of going crazy.   Feelings that nothing is real.   Sweating.   Shaking.   Chest pain or a fast heartbeat (palpitations).   Smothering, choking sensations.   Feelings of impending doom and that death is near.   Tingling of extremities, this may be from over-breathing.   Altered reality (derealization).   Being detached from yourself (depersonalization).  Several symptoms can be present to make up anxiety or panic attacks. DIAGNOSIS  The evaluation by your caregiver will depend on the type of symptoms you are experiencing. The diagnosis of anxiety or panic attack is made when no physical illness can be determined to be a cause of the symptoms. TREATMENT  Treatment to prevent anxiety and panic attacks may include:  Avoidance of circumstances that cause anxiety.   Reassurance and relaxation.   Regular exercise.   Relaxation therapies, such as yoga.   Psychotherapy with a psychiatrist or therapist.   Avoidance of caffeine, alcohol and illegal drugs.   Prescribed medication.  SEEK IMMEDIATE MEDICAL CARE IF:   You experience panic attack symptoms that are different than your usual symptoms.   You have any worsening or concerning symptoms.  Document Released: 12/29/2004 Document Revised: 12/18/2010 Document Reviewed: 05/02/2009 [employer] Patient Information 2012 [employer]. 

## 2011-06-10 NOTE — ED Notes (Signed)
Pt c/o palpitations that started yesterday.

## 2012-09-15 ENCOUNTER — Encounter (HOSPITAL_COMMUNITY): Payer: Self-pay | Admitting: *Deleted

## 2012-09-15 ENCOUNTER — Emergency Department (HOSPITAL_COMMUNITY)
Admission: EM | Admit: 2012-09-15 | Discharge: 2012-09-15 | Disposition: A | Discharge status: Home / Self Care | Payer: Medicare Other | Attending: Emergency Medicine | Admitting: Emergency Medicine

## 2012-09-15 DIAGNOSIS — L03032 Cellulitis of left toe: Secondary | ICD-10-CM

## 2012-09-15 DIAGNOSIS — L02619 Cutaneous abscess of unspecified foot: Secondary | ICD-10-CM | POA: Insufficient documentation

## 2012-09-15 DIAGNOSIS — L03039 Cellulitis of unspecified toe: Secondary | ICD-10-CM | POA: Insufficient documentation

## 2012-09-15 DIAGNOSIS — Z87828 Personal history of other (healed) physical injury and trauma: Secondary | ICD-10-CM | POA: Insufficient documentation

## 2012-09-15 DIAGNOSIS — Z7982 Long term (current) use of aspirin: Secondary | ICD-10-CM | POA: Insufficient documentation

## 2012-09-15 DIAGNOSIS — E785 Hyperlipidemia, unspecified: Secondary | ICD-10-CM | POA: Insufficient documentation

## 2012-09-15 DIAGNOSIS — I10 Essential (primary) hypertension: Secondary | ICD-10-CM | POA: Insufficient documentation

## 2012-09-15 DIAGNOSIS — E119 Type 2 diabetes mellitus without complications: Secondary | ICD-10-CM | POA: Insufficient documentation

## 2012-09-15 DIAGNOSIS — Z79899 Other long term (current) drug therapy: Secondary | ICD-10-CM | POA: Insufficient documentation

## 2012-09-15 DIAGNOSIS — F17200 Nicotine dependence, unspecified, uncomplicated: Secondary | ICD-10-CM | POA: Insufficient documentation

## 2012-09-15 HISTORY — DX: Cellulitis, unspecified: L03.90

## 2012-09-15 MED ORDER — AMOXICILLIN-POT CLAVULANATE 500-125 MG PO TABS: 1.0000 | ORAL_TABLET | Freq: Three times a day (TID) | ORAL | Status: DC
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
## 2012-09-15 NOTE — ED Notes (Signed)
Infection of lt great toe .

## 2012-11-04 ENCOUNTER — Emergency Department (HOSPITAL_COMMUNITY): Payer: Medicare Other

## 2012-11-04 ENCOUNTER — Encounter (HOSPITAL_COMMUNITY): Payer: Self-pay | Admitting: Emergency Medicine

## 2012-11-04 ENCOUNTER — Emergency Department (HOSPITAL_COMMUNITY)
Admission: EM | Admit: 2012-11-04 | Discharge: 2012-11-04 | Disposition: A | Discharge status: Home / Self Care | Payer: Medicare Other | Attending: Emergency Medicine | Admitting: Emergency Medicine

## 2012-11-04 DIAGNOSIS — F17200 Nicotine dependence, unspecified, uncomplicated: Secondary | ICD-10-CM | POA: Insufficient documentation

## 2012-11-04 DIAGNOSIS — Z7982 Long term (current) use of aspirin: Secondary | ICD-10-CM | POA: Insufficient documentation

## 2012-11-04 DIAGNOSIS — S6990XA Unspecified injury of unspecified wrist, hand and finger(s), initial encounter: Secondary | ICD-10-CM | POA: Insufficient documentation

## 2012-11-04 DIAGNOSIS — E119 Type 2 diabetes mellitus without complications: Secondary | ICD-10-CM | POA: Insufficient documentation

## 2012-11-04 DIAGNOSIS — Z872 Personal history of diseases of the skin and subcutaneous tissue: Secondary | ICD-10-CM | POA: Insufficient documentation

## 2012-11-04 DIAGNOSIS — S63501A Unspecified sprain of right wrist, initial encounter: Secondary | ICD-10-CM

## 2012-11-04 DIAGNOSIS — Y929 Unspecified place or not applicable: Secondary | ICD-10-CM | POA: Insufficient documentation

## 2012-11-04 DIAGNOSIS — E785 Hyperlipidemia, unspecified: Secondary | ICD-10-CM | POA: Insufficient documentation

## 2012-11-04 DIAGNOSIS — Z87828 Personal history of other (healed) physical injury and trauma: Secondary | ICD-10-CM | POA: Insufficient documentation

## 2012-11-04 DIAGNOSIS — Y9389 Activity, other specified: Secondary | ICD-10-CM | POA: Insufficient documentation

## 2012-11-04 DIAGNOSIS — Z79899 Other long term (current) drug therapy: Secondary | ICD-10-CM | POA: Insufficient documentation

## 2012-11-04 DIAGNOSIS — I10 Essential (primary) hypertension: Secondary | ICD-10-CM | POA: Insufficient documentation

## 2012-11-04 DIAGNOSIS — X500XXA Overexertion from strenuous movement or load, initial encounter: Secondary | ICD-10-CM | POA: Insufficient documentation

## 2012-11-04 MED ORDER — MELOXICAM 7.5 MG PO TABS: 7.5000 mg | ORAL_TABLET | Freq: Every day | ORAL | Status: DC

## 2012-11-04 MED ORDER — HYDROCODONE-ACETAMINOPHEN 5-325 MG PO TABS
1.0000 | ORAL_TABLET | Freq: Once | ORAL | Status: AC
  Administered 2012-11-04: 1 via ORAL
  Filled 2012-11-04: qty 1

## 2012-11-04 MED ORDER — HYDROCODONE-ACETAMINOPHEN 5-325 MG PO TABS: ORAL_TABLET | ORAL | Status: DC
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
## 2012-11-04 NOTE — ED Notes (Signed)
Pain in right hand, states it started hurting after lifting a generator, states he has had surgery on the hand in the past and just wants it checked

## 2012-11-04 NOTE — ED Notes (Signed)
Pt alert & oriented x4, stable gait. Patient given discharge instructions, paperwork & prescription(s). Patient  instructed to stop at the registration desk to finish any additional paperwork. Patient verbalized understanding. Pt left department w/ no further questions. 

## 2012-11-05 NOTE — ED Provider Notes (Signed)
Medical screening examination/treatment/procedure(s) were performed by non-physician practitioner and as supervising physician I was immediately available for consultation/collaboration.  EKG Interpretation   None         Charles B. Sheldon, MD 11/05/12 1359 

## 2012-11-14 ENCOUNTER — Ambulatory Visit: Payer: Medicare HMO | Admitting: Orthopedic Surgery

## 2013-02-14 ENCOUNTER — Encounter (HOSPITAL_COMMUNITY): Payer: Self-pay | Admitting: Emergency Medicine

## 2013-02-14 ENCOUNTER — Emergency Department (HOSPITAL_COMMUNITY)
Admission: EM | Admit: 2013-02-14 | Discharge: 2013-02-14 | Disposition: A | Discharge status: Home / Self Care | Payer: Medicare HMO | Attending: Emergency Medicine | Admitting: Emergency Medicine

## 2013-02-14 DIAGNOSIS — Z87828 Personal history of other (healed) physical injury and trauma: Secondary | ICD-10-CM | POA: Insufficient documentation

## 2013-02-14 DIAGNOSIS — Z7982 Long term (current) use of aspirin: Secondary | ICD-10-CM | POA: Insufficient documentation

## 2013-02-14 DIAGNOSIS — Z791 Long term (current) use of non-steroidal anti-inflammatories (NSAID): Secondary | ICD-10-CM | POA: Insufficient documentation

## 2013-02-14 DIAGNOSIS — I10 Essential (primary) hypertension: Secondary | ICD-10-CM | POA: Insufficient documentation

## 2013-02-14 DIAGNOSIS — L509 Urticaria, unspecified: Secondary | ICD-10-CM

## 2013-02-14 DIAGNOSIS — E785 Hyperlipidemia, unspecified: Secondary | ICD-10-CM | POA: Insufficient documentation

## 2013-02-14 DIAGNOSIS — E119 Type 2 diabetes mellitus without complications: Secondary | ICD-10-CM | POA: Insufficient documentation

## 2013-02-14 DIAGNOSIS — F17200 Nicotine dependence, unspecified, uncomplicated: Secondary | ICD-10-CM | POA: Insufficient documentation

## 2013-02-14 DIAGNOSIS — Z79899 Other long term (current) drug therapy: Secondary | ICD-10-CM | POA: Insufficient documentation

## 2013-02-14 DIAGNOSIS — Z792 Long term (current) use of antibiotics: Secondary | ICD-10-CM | POA: Insufficient documentation

## 2013-02-14 MED ORDER — EPINEPHRINE 0.3 MG/0.3ML IJ SOAJ: 0.3000 mg | Freq: Once | INTRAMUSCULAR | Status: DC

## 2013-02-14 MED ORDER — PREDNISONE 50 MG PO TABS
60.0000 mg | ORAL_TABLET | Freq: Once | ORAL | Status: AC
  Administered 2013-02-14: 60 mg via ORAL
  Filled 2013-02-14 (×2): qty 1

## 2013-02-14 MED ORDER — DIPHENHYDRAMINE HCL 25 MG PO CAPS
25.0000 mg | ORAL_CAPSULE | Freq: Once | ORAL | Status: AC
  Administered 2013-02-14: 25 mg via ORAL
  Filled 2013-02-14: qty 1

## 2013-02-14 MED ORDER — FAMOTIDINE 20 MG PO TABS
20.0000 mg | ORAL_TABLET | Freq: Once | ORAL | Status: AC
  Administered 2013-02-14: 20 mg via ORAL
  Filled 2013-02-14: qty 1

## 2013-02-14 NOTE — ED Provider Notes (Signed)
Patient with pruritic rash onset prior your prior to coming here. No shortness of breath no difficulty swallowing or hoarseness no fever patient is to fill ill. The treatment prior to coming here. On exam patient alert no distress HEENT exam no because the lesion. No swelling of tongue lips or uvula neck supple lungs clear auscultation was skin there is an urticarial rash on his back. 29 pmPatient feels improved since treatment in the emergency department his radial home  Orlie Dakin, MD 02/14/13 2332

## 2013-02-14 NOTE — Discharge Instructions (Signed)
Your examination is consistent with hives. Please use Allegra each morning, use Benadryl every 6 hours if needed. This medication may cause drowsiness, please use with caution. You are receiving a prescription for EpiPen. Please carry this with you at all times. If you should have another allergic reaction, please inject your thigh and then come to the emergency department as sone as possible. Hives Hives are itchy, red, puffy (swollen) areas of the skin. Hives can change in size and location on your body. Hives can come and go for hours, days, or weeks. Hives do not spread from person to person (noncontagious). Scratching, exercise, and stress can make your hives worse. HOME CARE  Avoid things that cause your hives (triggers).  Take antihistamine medicines as told by your doctor. Do not drive while taking an antihistamine.  Take any other medicines for itching as told by your doctor.  Wear loose-fitting clothing.  Keep all doctor visits as told. GET HELP RIGHT AWAY IF:   You have a fever.  Your tongue or lips are puffy.  You have trouble breathing or swallowing.  You feel tightness in the throat or chest.  You have belly (abdominal) pain.  You have lasting or severe itching that is not helped by medicine.  You have painful or puffy joints. These problems may be the first sign of a life-threatening allergic reaction. Call your local emergency services (911 in [place] MAKE SURE YOU:   Understand these instructions.  Will watch your condition.  Will get help right away if you are not doing well or get worse. Document Released: 10/08/2007 Document Revised: 06/30/2011 Document Reviewed: 03/24/2011 [facility] Patient Information 2014 [place].
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
## 2013-02-14 NOTE — ED Notes (Signed)
Pt reports breaking out in a rash on his back couple of hours ago, states it is starting to spread and has whelts now, itching, has not had any benadryl for same.  Pt states he was lying on the floor working on a sink.

## 2013-02-16 NOTE — ED Provider Notes (Signed)
Medical screening examination/treatment/procedure(s) were performed by non-physician practitioner and as supervising physician I was immediately available for consultation/collaboration.  EKG Interpretation   None         Orpah Greek, MD 02/16/13 [phone]

## 2013-06-09 ENCOUNTER — Emergency Department (HOSPITAL_COMMUNITY)
Admission: EM | Admit: 2013-06-09 | Discharge: 2013-06-09 | Disposition: A | Discharge status: Home / Self Care | Payer: Medicare HMO | Attending: Emergency Medicine | Admitting: Emergency Medicine

## 2013-06-09 ENCOUNTER — Encounter (HOSPITAL_COMMUNITY): Payer: Self-pay | Admitting: Emergency Medicine

## 2013-06-09 DIAGNOSIS — E785 Hyperlipidemia, unspecified: Secondary | ICD-10-CM | POA: Insufficient documentation

## 2013-06-09 DIAGNOSIS — Y929 Unspecified place or not applicable: Secondary | ICD-10-CM | POA: Insufficient documentation

## 2013-06-09 DIAGNOSIS — Z79899 Other long term (current) drug therapy: Secondary | ICD-10-CM | POA: Insufficient documentation

## 2013-06-09 DIAGNOSIS — Z872 Personal history of diseases of the skin and subcutaneous tissue: Secondary | ICD-10-CM | POA: Insufficient documentation

## 2013-06-09 DIAGNOSIS — R141 Gas pain: Secondary | ICD-10-CM | POA: Insufficient documentation

## 2013-06-09 DIAGNOSIS — Y939 Activity, unspecified: Secondary | ICD-10-CM | POA: Insufficient documentation

## 2013-06-09 DIAGNOSIS — R143 Flatulence: Secondary | ICD-10-CM

## 2013-06-09 DIAGNOSIS — F17200 Nicotine dependence, unspecified, uncomplicated: Secondary | ICD-10-CM | POA: Insufficient documentation

## 2013-06-09 DIAGNOSIS — R142 Eructation: Secondary | ICD-10-CM

## 2013-06-09 DIAGNOSIS — I10 Essential (primary) hypertension: Secondary | ICD-10-CM | POA: Insufficient documentation

## 2013-06-09 DIAGNOSIS — Z791 Long term (current) use of non-steroidal anti-inflammatories (NSAID): Secondary | ICD-10-CM | POA: Insufficient documentation

## 2013-06-09 DIAGNOSIS — Z7982 Long term (current) use of aspirin: Secondary | ICD-10-CM | POA: Insufficient documentation

## 2013-06-09 DIAGNOSIS — S30860A Insect bite (nonvenomous) of lower back and pelvis, initial encounter: Secondary | ICD-10-CM | POA: Insufficient documentation

## 2013-06-09 DIAGNOSIS — E119 Type 2 diabetes mellitus without complications: Secondary | ICD-10-CM | POA: Insufficient documentation

## 2013-06-09 DIAGNOSIS — W57XXXA Bitten or stung by nonvenomous insect and other nonvenomous arthropods, initial encounter: Secondary | ICD-10-CM

## 2013-06-09 MED ORDER — DOXYCYCLINE HYCLATE 100 MG PO CAPS: 100.0000 mg | ORAL_CAPSULE | Freq: Two times a day (BID) | ORAL | Status: DC

## 2013-06-09 NOTE — ED Notes (Addendum)
Tick bites, right leg and chest, c/o tick bite on right chest

## 2013-06-09 NOTE — Discharge Instructions (Signed)
Follow up with your md next week if problems °
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
## 2013-10-18 ENCOUNTER — Emergency Department (HOSPITAL_COMMUNITY)
Admission: EM | Admit: 2013-10-18 | Discharge: 2013-10-18 | Disposition: A | Discharge status: Home / Self Care | Payer: Medicare HMO | Attending: Emergency Medicine | Admitting: Emergency Medicine

## 2013-10-18 ENCOUNTER — Encounter (HOSPITAL_COMMUNITY): Payer: Self-pay | Admitting: Emergency Medicine

## 2013-10-18 DIAGNOSIS — E119 Type 2 diabetes mellitus without complications: Secondary | ICD-10-CM | POA: Insufficient documentation

## 2013-10-18 DIAGNOSIS — Z79899 Other long term (current) drug therapy: Secondary | ICD-10-CM | POA: Insufficient documentation

## 2013-10-18 DIAGNOSIS — Z872 Personal history of diseases of the skin and subcutaneous tissue: Secondary | ICD-10-CM | POA: Insufficient documentation

## 2013-10-18 DIAGNOSIS — Z720 Tobacco use: Secondary | ICD-10-CM | POA: Diagnosis not present

## 2013-10-18 DIAGNOSIS — J069 Acute upper respiratory infection, unspecified: Secondary | ICD-10-CM | POA: Insufficient documentation

## 2013-10-18 DIAGNOSIS — Z87828 Personal history of other (healed) physical injury and trauma: Secondary | ICD-10-CM | POA: Diagnosis not present

## 2013-10-18 DIAGNOSIS — I10 Essential (primary) hypertension: Secondary | ICD-10-CM | POA: Insufficient documentation

## 2013-10-18 DIAGNOSIS — Z7982 Long term (current) use of aspirin: Secondary | ICD-10-CM | POA: Insufficient documentation

## 2013-10-18 DIAGNOSIS — E785 Hyperlipidemia, unspecified: Secondary | ICD-10-CM | POA: Insufficient documentation

## 2013-10-18 DIAGNOSIS — R0981 Nasal congestion: Secondary | ICD-10-CM | POA: Diagnosis present

## 2013-10-18 MED ORDER — HYDROCOD POLST-CHLORPHEN POLST 10-8 MG/5ML PO LQCR: 5.0000 mL | Freq: Two times a day (BID) | ORAL | Status: DC | PRN

## 2013-10-18 MED ORDER — HYDROCOD POLST-CHLORPHEN POLST 10-8 MG/5ML PO LQCR
5.0000 mL | Freq: Once | ORAL | Status: AC
  Administered 2013-10-18: 5 mL via ORAL
  Filled 2013-10-18: qty 5

## 2013-10-18 MED ORDER — CETIRIZINE-PSEUDOEPHEDRINE ER 5-120 MG PO TB12: 1.0000 | ORAL_TABLET | Freq: Two times a day (BID) | ORAL | Status: DC

## 2013-10-18 NOTE — Discharge Instructions (Signed)
Upper Respiratory Infection, Adult An upper respiratory infection (URI) is also sometimes known as the common cold. The upper respiratory tract includes the nose, sinuses, throat, trachea, and bronchi. Bronchi are the airways leading to the lungs. Most people improve within 1 week, but symptoms can last up to 2 weeks. A residual cough may last even longer.  CAUSES Many different viruses can infect the tissues lining the upper respiratory tract. The tissues become irritated and inflamed and often become very moist. Mucus production is also common. A cold is contagious. You can easily spread the virus to others by oral contact. This includes kissing, sharing a glass, coughing, or sneezing. Touching your mouth or nose and then touching a surface, which is then touched by another person, can also spread the virus. SYMPTOMS  Symptoms typically develop 1 to 3 days after you come in contact with a cold virus. Symptoms vary from person to person. They may include:  Runny nose.  Sneezing.  Nasal congestion.  Sinus irritation.  Sore throat.  Loss of voice (laryngitis).  Cough.  Fatigue.  Muscle aches.  Loss of appetite.  Headache.  Low-grade fever. DIAGNOSIS  You might diagnose your own cold based on familiar symptoms, since most people get a cold 2 to 3 times a year. Your caregiver can confirm this based on your exam. Most importantly, your caregiver can check that your symptoms are not due to another disease such as strep throat, sinusitis, pneumonia, asthma, or epiglottitis. Blood tests, throat tests, and X-rays are not necessary to diagnose a common cold, but they may sometimes be helpful in excluding other more serious diseases. Your caregiver will decide if any further tests are required. RISKS AND COMPLICATIONS  You may be at risk for a more severe case of the common cold if you smoke cigarettes, have chronic heart disease (such as heart failure) or lung disease (such as asthma), or if  you have a weakened immune system. The very young and very old are also at risk for more serious infections. Bacterial sinusitis, middle ear infections, and bacterial pneumonia can complicate the common cold. The common cold can worsen asthma and chronic obstructive pulmonary disease (COPD). Sometimes, these complications can require emergency medical care and may be life-threatening. PREVENTION  The best way to protect against getting a cold is to practice good hygiene. Avoid oral or hand contact with people with cold symptoms. Wash your hands often if contact occurs. There is no clear evidence that vitamin C, vitamin E, echinacea, or exercise reduces the chance of developing a cold. However, it is always recommended to get plenty of rest and practice good nutrition. TREATMENT  Treatment is directed at relieving symptoms. There is no cure. Antibiotics are not effective, because the infection is caused by a virus, not by bacteria. Treatment may include:  Increased fluid intake. Sports drinks offer valuable electrolytes, sugars, and fluids.  Breathing heated mist or steam (vaporizer or shower).  Eating chicken soup or other clear broths, and maintaining good nutrition.  Getting plenty of rest.  Using gargles or lozenges for comfort.  Controlling fevers with ibuprofen or acetaminophen as directed by your caregiver.  Increasing usage of your inhaler if you have asthma. Zinc gel and zinc lozenges, taken in the first 24 hours of the common cold, can shorten the duration and lessen the severity of symptoms. Pain medicines may help with fever, muscle aches, and throat pain. A variety of non-prescription medicines are available to treat congestion and runny nose. Your caregiver  can make recommendations and may suggest nasal or lung inhalers for other symptoms.  HOME CARE INSTRUCTIONS   Only take over-the-counter or prescription medicines for pain, discomfort, or fever as directed by your  caregiver.  Use a warm mist humidifier or inhale steam from a shower to increase air moisture. This may keep secretions moist and make it easier to breathe.  Drink enough water and fluids to keep your urine clear or pale yellow.  Rest as needed.  Return to work when your temperature has returned to normal or as your caregiver advises. You may need to stay home longer to avoid infecting others. You can also use a face mask and careful hand washing to prevent spread of the virus. SEEK MEDICAL CARE IF:   After the first few days, you feel you are getting worse rather than better.  You need your caregiver's advice about medicines to control symptoms.  You develop chills, worsening shortness of breath, or brown or red sputum. These may be signs of pneumonia.  You develop yellow or brown nasal discharge or pain in the face, especially when you bend forward. These may be signs of sinusitis.  You develop a fever, swollen neck glands, pain with swallowing, or white areas in the back of your throat. These may be signs of strep throat. SEEK IMMEDIATE MEDICAL CARE IF:   You have a fever.  You develop severe or persistent headache, ear pain, sinus pain, or chest pain.  You develop wheezing, a prolonged cough, cough up blood, or have a change in your usual mucus (if you have chronic lung disease).  You develop sore muscles or a stiff neck. Document Released: 06/24/2000 Document Revised: 03/23/2011 Document Reviewed: 04/05/2013 [facility] Patient Information 2015 [place], [place]. This information is not intended to replace advice given to you by your health care provider. Make sure you discuss any questions you have with your health care provider.   You may take the tussionex prescribed for cough.    This will make you drowsy - do not drive within 4 hours of taking this medication.

## 2013-10-18 NOTE — ED Notes (Signed)
Nasal congestion, ears aching, sore throat, cough with white sputum. Onset last night

## 2013-10-19 NOTE — ED Provider Notes (Signed)
Medical screening examination/treatment/procedure(s) were performed by non-physician practitioner and as supervising physician I was immediately available for consultation/collaboration.   EKG Interpretation None        Maudry Diego, MD 10/19/13 2204
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
## 2013-12-12 ENCOUNTER — Encounter: Payer: Self-pay | Admitting: Family Medicine

## 2013-12-12 DIAGNOSIS — E119 Type 2 diabetes mellitus without complications: Secondary | ICD-10-CM | POA: Insufficient documentation

## 2013-12-12 DIAGNOSIS — I10 Essential (primary) hypertension: Secondary | ICD-10-CM | POA: Insufficient documentation

## 2013-12-12 DIAGNOSIS — F419 Anxiety disorder, unspecified: Secondary | ICD-10-CM | POA: Insufficient documentation

## 2013-12-12 DIAGNOSIS — E782 Mixed hyperlipidemia: Secondary | ICD-10-CM | POA: Insufficient documentation

## 2013-12-12 DIAGNOSIS — E785 Hyperlipidemia, unspecified: Secondary | ICD-10-CM | POA: Insufficient documentation

## 2013-12-21 ENCOUNTER — Ambulatory Visit (INDEPENDENT_AMBULATORY_CARE_PROVIDER_SITE_OTHER): Payer: Medicare HMO | Admitting: Physician Assistant

## 2013-12-21 ENCOUNTER — Encounter: Payer: Self-pay | Admitting: Physician Assistant

## 2013-12-21 VITALS — BP 126/66 | HR 84 | Temp 98.5°F | Resp 18 | Ht 69.25 in | Wt 147.0 lb

## 2013-12-21 DIAGNOSIS — F419 Anxiety disorder, unspecified: Secondary | ICD-10-CM

## 2013-12-21 DIAGNOSIS — E785 Hyperlipidemia, unspecified: Secondary | ICD-10-CM

## 2013-12-21 DIAGNOSIS — Z720 Tobacco use: Secondary | ICD-10-CM

## 2013-12-21 DIAGNOSIS — E1165 Type 2 diabetes mellitus with hyperglycemia: Secondary | ICD-10-CM

## 2013-12-21 DIAGNOSIS — D509 Iron deficiency anemia, unspecified: Secondary | ICD-10-CM | POA: Insufficient documentation

## 2013-12-21 DIAGNOSIS — I10 Essential (primary) hypertension: Secondary | ICD-10-CM

## 2013-12-21 DIAGNOSIS — F17200 Nicotine dependence, unspecified, uncomplicated: Secondary | ICD-10-CM

## 2013-12-21 LAB — COMPLETE METABOLIC PANEL WITH GFR
ALT: 10 U/L (ref 0–53)
AST: 14 U/L (ref 0–37)
Albumin: 4 g/dL (ref 3.5–5.2)
Alkaline Phosphatase: 43 U/L (ref 39–117)
BUN: 12 mg/dL (ref 6–23)
CALCIUM: 9.6 mg/dL (ref 8.4–10.5)
CHLORIDE: 100 meq/L (ref 96–112)
CO2: 27 mEq/L (ref 19–32)
CREATININE: 0.75 mg/dL (ref 0.50–1.35)
GFR, Est African American: >89 mL/min
GFR, Est Non African American: >89 mL/min
Glucose, Bld: 217 mg/dL — ABNORMAL HIGH (ref 70–99)
POTASSIUM: 4.1 meq/L (ref 3.5–5.3)
SODIUM: 135 meq/L (ref 135–145)
TOTAL PROTEIN: 6.5 g/dL (ref 6.0–8.3)
Total Bilirubin: 0.4 mg/dL (ref 0.2–1.2)

## 2013-12-21 LAB — CBC WITH DIFFERENTIAL/PLATELET
BASOS ABS: 0.1 10*3/uL (ref 0.0–0.1)
Basophils Relative: 1 % (ref 0–1)
EOS PCT: 1 % (ref 0–5)
Eosinophils Absolute: 0.1 10*3/uL (ref 0.0–0.7)
HCT: 38.7 % — ABNORMAL LOW (ref 39.0–52.0)
HEMOGLOBIN: 13 g/dL (ref 13.0–17.0)
LYMPHS PCT: 32 % (ref 12–46)
Lymphs Abs: 3.2 10*3/uL (ref 0.7–4.0)
MCH: 31.1 pg (ref 26.0–34.0)
MCHC: 33.6 g/dL (ref 30.0–36.0)
MCV: 92.6 fL (ref 78.0–100.0)
MPV: 9.9 fL (ref 9.4–12.4)
Monocytes Absolute: 0.8 10*3/uL (ref 0.1–1.0)
Monocytes Relative: 8 % (ref 3–12)
NEUTROS ABS: 5.9 10*3/uL (ref 1.7–7.7)
NEUTROS PCT: 58 % (ref 43–77)
PLATELETS: 221 10*3/uL (ref 150–400)
RBC: 4.18 MIL/uL — AB (ref 4.22–5.81)
RDW: 14.7 % (ref 11.5–15.5)
WBC: 10.1 10*3/uL (ref 4.0–10.5)

## 2013-12-21 LAB — HEMOGLOBIN A1C
Hgb A1c MFr Bld: 7.6 % — ABNORMAL HIGH (ref <5.7)
MEAN PLASMA GLUCOSE: 171 mg/dL — AB (ref <117)
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
## 2013-12-27 ENCOUNTER — Encounter (HOSPITAL_COMMUNITY): Payer: Self-pay | Admitting: *Deleted

## 2013-12-27 ENCOUNTER — Emergency Department (HOSPITAL_COMMUNITY)
Admission: EM | Admit: 2013-12-27 | Discharge: 2013-12-27 | Disposition: A | Discharge status: Home / Self Care | Payer: Medicare HMO | Attending: Emergency Medicine | Admitting: Emergency Medicine

## 2013-12-27 ENCOUNTER — Emergency Department (HOSPITAL_COMMUNITY): Payer: Medicare HMO

## 2013-12-27 ENCOUNTER — Telehealth: Payer: Self-pay | Admitting: Family Medicine

## 2013-12-27 DIAGNOSIS — W208XXA Other cause of strike by thrown, projected or falling object, initial encounter: Secondary | ICD-10-CM | POA: Insufficient documentation

## 2013-12-27 DIAGNOSIS — S93401A Sprain of unspecified ligament of right ankle, initial encounter: Secondary | ICD-10-CM | POA: Insufficient documentation

## 2013-12-27 DIAGNOSIS — Z79899 Other long term (current) drug therapy: Secondary | ICD-10-CM | POA: Diagnosis not present

## 2013-12-27 DIAGNOSIS — Z872 Personal history of diseases of the skin and subcutaneous tissue: Secondary | ICD-10-CM | POA: Diagnosis not present

## 2013-12-27 DIAGNOSIS — Z87828 Personal history of other (healed) physical injury and trauma: Secondary | ICD-10-CM | POA: Insufficient documentation

## 2013-12-27 DIAGNOSIS — E119 Type 2 diabetes mellitus without complications: Secondary | ICD-10-CM | POA: Diagnosis not present

## 2013-12-27 DIAGNOSIS — Y9289 Other specified places as the place of occurrence of the external cause: Secondary | ICD-10-CM | POA: Diagnosis not present

## 2013-12-27 DIAGNOSIS — F419 Anxiety disorder, unspecified: Secondary | ICD-10-CM | POA: Diagnosis not present

## 2013-12-27 DIAGNOSIS — Y9389 Activity, other specified: Secondary | ICD-10-CM | POA: Diagnosis not present

## 2013-12-27 DIAGNOSIS — Y998 Other external cause status: Secondary | ICD-10-CM | POA: Insufficient documentation

## 2013-12-27 DIAGNOSIS — T1490XA Injury, unspecified, initial encounter: Secondary | ICD-10-CM

## 2013-12-27 DIAGNOSIS — Z720 Tobacco use: Secondary | ICD-10-CM | POA: Insufficient documentation

## 2013-12-27 DIAGNOSIS — I10 Essential (primary) hypertension: Secondary | ICD-10-CM | POA: Diagnosis not present

## 2013-12-27 DIAGNOSIS — Z7982 Long term (current) use of aspirin: Secondary | ICD-10-CM | POA: Diagnosis not present

## 2013-12-27 DIAGNOSIS — E785 Hyperlipidemia, unspecified: Secondary | ICD-10-CM | POA: Insufficient documentation

## 2013-12-27 DIAGNOSIS — S99921A Unspecified injury of right foot, initial encounter: Secondary | ICD-10-CM | POA: Diagnosis present

## 2013-12-27 MED ORDER — HYDROCODONE-ACETAMINOPHEN 5-325 MG PO TABS: ORAL_TABLET | ORAL | Status: DC
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
## 2013-12-27 NOTE — ED Notes (Addendum)
Pt states he dropped a board on his right foot at 1100. 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 Pt states CBG at home was normal this morning.

## 2013-12-27 NOTE — Discharge Instructions (Signed)
Ankle Sprain  An ankle sprain is an injury to the strong, fibrous tissues (ligaments) that hold your ankle bones together.   HOME CARE   · Put ice on your ankle for 1-2 days or as told by your doctor.  ¨ Put ice in a plastic bag.  ¨ Place a towel between your skin and the bag.  ¨ Leave the ice on for 15-20 minutes at a time, every 2 hours while you are awake.  · Only take medicine as told by your doctor.  · Raise (elevate) your injured ankle above the level of your heart as much as possible for 2-3 days.  · Use crutches if your doctor tells you to. Slowly put your own weight on the affected ankle. Use the crutches until you can walk without pain.  · If you have a plaster splint:  ¨ Do not rest it on anything harder than a pillow for 24 hours.  ¨ Do not put weight on it.  ¨ Do not get it wet.  ¨ Take it off to shower or bathe.  · If given, use an elastic wrap or support stocking for support. Take the wrap off if your toes lose feeling (numb), tingle, or turn cold or blue.  · If you have an air splint:  ¨ Add or let out air to make it comfortable.  ¨ Take it off at night and to shower and bathe.  ¨ Wiggle your toes and move your ankle up and down often while you are wearing it.  GET HELP IF:  · You have rapidly increasing bruising or puffiness (swelling).  · Your toes feel very cold.  · You lose feeling in your foot.  · Your medicine does not help your pain.  GET HELP RIGHT AWAY IF:   · Your toes lose feeling (numb) or turn blue.  · You have severe pain that is increasing.  MAKE SURE YOU:   · Understand these instructions.  · Will watch your condition.  · Will get help right away if you are not doing well or get worse.  Document Released: 06/17/2007 Document Revised: 05/15/2013 Document Reviewed: 07/13/2011  ExitCare® Patient Information ©2015 [employer]. This information is not intended to replace advice given to you by your health care provider. Make sure you discuss any questions you have with your health care  provider.

## 2013-12-27 NOTE — ED Notes (Signed)
Patient given discharge instruction, verbalized understand. Patient ambulatory out of the department.  

## 2013-12-27 NOTE — Telephone Encounter (Signed)
Spoke to wife.  See med list.  I check what he has at home and is taking.  Please advise again about med changes.

## 2013-12-27 NOTE — ED Provider Notes (Addendum)
  Face-to-face evaluation   History: Contusion, right foot with large his lumbar, earlier today.  His pain with walking, but is able to bear weight.  Physical exam: Right foot mild tenderness without swelling medial aspect.  No deformity or laceration.  Discussed treatment options with patient and wife.  Medical screening examination/treatment/procedure(s) were conducted as a shared visit with non-physician practitioner(s) and myself.  I personally evaluated the patient during the encounter    Richarda Blade, MD 12/30/13 1205

## 2013-12-27 NOTE — Telephone Encounter (Signed)
-----   Message from Orlena Sheldon, PA-C sent at 12/25/2013  1:56 PM EST ----- This man was seen as a new patient recently. His medication list includes metaglip as well as glipizide and Janumet. Tell him to STOP the metaglip.  Cont Glipizide as it is. STOP the Janumet and change this to: ---Januvia 100mg  1 po QD--------# 30 + 5 --Metformin 1000 mg 1 po BID---# 60 + 5 Tell  him to make sure to have office visit with me again in 3 months.

## 2013-12-27 NOTE — Telephone Encounter (Signed)
Calling back to give you a current list of medications please call back at [phone]

## 2013-12-27 NOTE — Telephone Encounter (Signed)
Tried to tell pt about lab results and med changes.  He was very confused.  He said his wife handles this for him and he will have her call me back for results and recommendations.

## 2014-01-10 MED ORDER — LOSARTAN POTASSIUM 50 MG PO TABS: 50.0000 mg | ORAL_TABLET | Freq: Every day | ORAL | Status: DC

## 2014-01-10 MED ORDER — ALPRAZOLAM 0.5 MG PO TABS: 0.5000 mg | ORAL_TABLET | Freq: Three times a day (TID) | ORAL | Status: DC | PRN

## 2014-01-10 MED ORDER — PIOGLITAZONE HCL 45 MG PO TABS: 45.0000 mg | ORAL_TABLET | Freq: Every day | ORAL | Status: DC

## 2014-01-10 MED ORDER — SIMVASTATIN 40 MG PO TABS: 40.0000 mg | ORAL_TABLET | Freq: Every day | ORAL | Status: DC

## 2014-01-10 MED ORDER — GLIPIZIDE-METFORMIN HCL 5-500 MG PO TABS: 1.0000 | ORAL_TABLET | Freq: Two times a day (BID) | ORAL | Status: DC

## 2014-01-10 NOTE — Telephone Encounter (Signed)
Wife aware to stay on current meds.  Has next appt scheduled for March.  Needs refills of meds until then.  I refilled regular meds.  Need OK for Alprazolam.

## 2014-01-10 NOTE — Telephone Encounter (Signed)
Approved.  Can give enough Refills to last until March appt.

## 2014-01-10 NOTE — Telephone Encounter (Signed)
He can simply continue current medications for now until he has his follow-up office visit 3 months.

## 2014-01-10 NOTE — Telephone Encounter (Signed)
Alprazolam called in 

## 2014-01-17 ENCOUNTER — Telehealth: Payer: Self-pay | Admitting: Physician Assistant

## 2014-01-17 MED ORDER — DULAGLUTIDE 1.5 MG/0.5ML ~~LOC~~ SOAJ: 1.0000 | SUBCUTANEOUS | Status: DC

## 2014-01-17 NOTE — Telephone Encounter (Signed)
[phone]  PT wife called wanting samples of the TRULICITY 1.5 VH/8.4ON SOPN I looked in sample closet and could not find any samples.  She states to call in the rx to [facility] in [place] But if we get in any samples of this medication to please call her

## 2014-01-17 NOTE — Telephone Encounter (Signed)
Called wife back.  Do have limited samples.  Told them to come get enough for 2 weeks.  Did send Rx to pharmacy.  If PA needed two week samples will give [place] time to work on that.

## 2014-03-29 ENCOUNTER — Encounter: Payer: Self-pay | Admitting: Physician Assistant

## 2014-03-29 ENCOUNTER — Ambulatory Visit (INDEPENDENT_AMBULATORY_CARE_PROVIDER_SITE_OTHER): Payer: Medicare HMO | Admitting: Physician Assistant

## 2014-03-29 VITALS — BP 136/70 | HR 68 | Temp 98.1°F | Resp 14 | Ht 69.0 in | Wt 149.0 lb

## 2014-03-29 DIAGNOSIS — Z720 Tobacco use: Secondary | ICD-10-CM

## 2014-03-29 DIAGNOSIS — I10 Essential (primary) hypertension: Secondary | ICD-10-CM

## 2014-03-29 DIAGNOSIS — D509 Iron deficiency anemia, unspecified: Secondary | ICD-10-CM | POA: Diagnosis not present

## 2014-03-29 DIAGNOSIS — F419 Anxiety disorder, unspecified: Secondary | ICD-10-CM | POA: Diagnosis not present

## 2014-03-29 DIAGNOSIS — IMO0002 Reserved for concepts with insufficient information to code with codable children: Secondary | ICD-10-CM

## 2014-03-29 DIAGNOSIS — E1165 Type 2 diabetes mellitus with hyperglycemia: Secondary | ICD-10-CM | POA: Diagnosis not present

## 2014-03-29 DIAGNOSIS — Z202 Contact with and (suspected) exposure to infections with a predominantly sexual mode of transmission: Secondary | ICD-10-CM | POA: Diagnosis not present

## 2014-03-29 DIAGNOSIS — F17200 Nicotine dependence, unspecified, uncomplicated: Secondary | ICD-10-CM

## 2014-03-29 DIAGNOSIS — E785 Hyperlipidemia, unspecified: Secondary | ICD-10-CM | POA: Diagnosis not present
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
## 2014-03-30 ENCOUNTER — Other Ambulatory Visit: Payer: Medicare HMO

## 2014-03-30 DIAGNOSIS — E785 Hyperlipidemia, unspecified: Secondary | ICD-10-CM

## 2014-03-30 DIAGNOSIS — Z202 Contact with and (suspected) exposure to infections with a predominantly sexual mode of transmission: Secondary | ICD-10-CM

## 2014-03-30 DIAGNOSIS — IMO0002 Reserved for concepts with insufficient information to code with codable children: Secondary | ICD-10-CM

## 2014-03-30 DIAGNOSIS — I10 Essential (primary) hypertension: Secondary | ICD-10-CM

## 2014-03-30 DIAGNOSIS — E1165 Type 2 diabetes mellitus with hyperglycemia: Secondary | ICD-10-CM

## 2014-03-30 LAB — COMPLETE METABOLIC PANEL WITH GFR
ALBUMIN: 3.8 g/dL (ref 3.5–5.2)
ALT: 15 U/L (ref 0–53)
AST: 13 U/L (ref 0–37)
Alkaline Phosphatase: 50 U/L (ref 39–117)
BUN: 15 mg/dL (ref 6–23)
CALCIUM: 9.1 mg/dL (ref 8.4–10.5)
CO2: 27 mEq/L (ref 19–32)
Chloride: 103 mEq/L (ref 96–112)
Creat: 0.77 mg/dL (ref 0.50–1.35)
GFR, Est African American: >89 mL/min
GFR, Est Non African American: >89 mL/min
Glucose, Bld: 166 mg/dL — ABNORMAL HIGH (ref 70–99)
POTASSIUM: 4.7 meq/L (ref 3.5–5.3)
SODIUM: 138 meq/L (ref 135–145)
TOTAL PROTEIN: 6.4 g/dL (ref 6.0–8.3)
Total Bilirubin: 0.3 mg/dL (ref 0.2–1.2)

## 2014-03-30 LAB — LIPID PANEL
Cholesterol: 145 mg/dL (ref 0–200)
HDL: 41 mg/dL (ref >=40)
LDL Cholesterol: 85 mg/dL (ref 0–99)
TRIGLYCERIDES: 97 mg/dL (ref <150)
Total CHOL/HDL Ratio: 3.5 Ratio
VLDL: 19 mg/dL (ref 0–40)

## 2014-03-31 LAB — HEMOGLOBIN A1C
Hgb A1c MFr Bld: 8.5 % — ABNORMAL HIGH (ref <5.7)
Mean Plasma Glucose: 197 mg/dL — ABNORMAL HIGH (ref <117)

## 2014-04-02 LAB — HSV(HERPES SMPLX)ABS-I+II(IGG+IGM)-BLD
HSV 1 Glycoprotein G Ab, IgG: 6.01 IV — ABNORMAL HIGH
HSV 2 Glycoprotein G Ab, IgG: 3.08 IV — ABNORMAL HIGH
Herpes Simplex Vrs I&II-IgM Ab (EIA): 2.4 INDEX — ABNORMAL HIGH

## 2014-04-06 ENCOUNTER — Telehealth: Payer: Self-pay | Admitting: Family Medicine

## 2014-04-06 NOTE — Telephone Encounter (Signed)
-----   Message from Orlena Sheldon, PA-C sent at 04/02/2014  2:19 PM EDT ----- 1-- tell patient that test does show that he has genital herpes. At office visit he reported that he had never had lesions or pain of Genital Herpes (so does not need treatment dose of medication).  Tell him there is a medication dose that he can take on a daily basis to prevent outbreaks and to prevent viral shedding so that he does not spread infection to others.  If he is agreeable to take this, then can send prescription for Valtrex 500 mg 1 by mouth daily #30 with 11 refills. --Add To Problem List--Herpes Simplex Virus  Type 2 (or Genital Herpes) --And Also add to Problem List---Herpes Simplex Virus Type 1  2-- Pt recently came here as new patient. At the time of prior lab results, he had told us that the medication list we had was incorrect. We had spoken with his wife after that and tried to get medication list corrected. Patient just brought [place] a corrected medication list.  According to that, he is NOT taking Janumet. This needs to be removed from med list.  But, because A1C is high, need to add Januvia 100mg  1 po QD  # 30 + 5.   3--Please correct his medication list-----see the AVS he brought to us--he circled the medications that he is actually taking. Will need to remove medication he is not taking.  Tell him to start taking the Aspirin 81mg  QD as directed.

## 2014-04-06 NOTE — Telephone Encounter (Signed)
Pt aware of lab results and provider recommendations.  States has never had problem with any herpes outbreaks and is declining treatment at this time.  Also hesitant to start Januvia. Said he does not want to try any "new to the market" medications because fear of side effects. Says he knows why his sugars are up and he can get them down on his own in a month.  Told will let provider know of his decision, should make one month follow up appt.  Did recommend low dose ASA and pt was fine with that. Medication list was verified per provider.

## 2014-04-09 NOTE — Telephone Encounter (Signed)
Wife calls today to clarify lab results and provider recommendations.  Her concerns were for the diabetic meds.  They still want to try to get sugars down without starting anything new (although wife states has taken Januvia before).  Told about starting 81mg  ASA.

## 2014-05-10 ENCOUNTER — Other Ambulatory Visit: Payer: Self-pay | Admitting: Physician Assistant

## 2014-05-10 NOTE — Telephone Encounter (Signed)
Medication refilled per protocol. 

## 2014-05-25 ENCOUNTER — Telehealth: Payer: Self-pay | Admitting: Physician Assistant

## 2014-05-25 NOTE — Telephone Encounter (Signed)
Pt not due for visit until mid-end June.  Left him message stating so

## 2014-05-25 NOTE — Telephone Encounter (Signed)
[phone] PT called and left a VM stating that he wasn't sure when he needed to come back in for his lab work, he is hoping that he hasn't went pass of when he was suppose to come in. He states that he has been taking care of his wife due to some of her medical concerns.

## 2014-06-12 ENCOUNTER — Ambulatory Visit: Payer: Medicare HMO

## 2014-06-12 ENCOUNTER — Encounter: Payer: Self-pay | Admitting: Family Medicine

## 2014-06-12 ENCOUNTER — Ambulatory Visit (INDEPENDENT_AMBULATORY_CARE_PROVIDER_SITE_OTHER): Payer: Medicare HMO | Admitting: Family Medicine

## 2014-06-12 ENCOUNTER — Ambulatory Visit: Payer: Medicare HMO | Admitting: Family Medicine

## 2014-06-12 VITALS — BP 122/70 | HR 88 | Temp 98.3°F | Resp 16 | Wt 145.0 lb

## 2014-06-12 DIAGNOSIS — S0300XA Dislocation of jaw, unspecified side, initial encounter: Secondary | ICD-10-CM

## 2014-06-12 DIAGNOSIS — S030XXA Dislocation of jaw, initial encounter: Secondary | ICD-10-CM

## 2014-06-12 DIAGNOSIS — R0982 Postnasal drip: Secondary | ICD-10-CM | POA: Diagnosis not present

## 2014-06-12 MED ORDER — FLUTICASONE PROPIONATE 50 MCG/ACT NA SUSP: 2.0000 | Freq: Every day | NASAL | Status: DC

## 2014-06-12 MED ORDER — MELOXICAM 15 MG PO TABS: 15.0000 mg | ORAL_TABLET | Freq: Every day | ORAL | Status: DC
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
## 2014-06-26 ENCOUNTER — Ambulatory Visit (INDEPENDENT_AMBULATORY_CARE_PROVIDER_SITE_OTHER): Payer: Medicare HMO | Admitting: Family Medicine

## 2014-06-26 ENCOUNTER — Encounter: Payer: Self-pay | Admitting: Family Medicine

## 2014-06-26 VITALS — BP 126/74 | HR 86 | Temp 98.1°F | Resp 18 | Ht 69.0 in | Wt 145.0 lb

## 2014-06-26 DIAGNOSIS — E1165 Type 2 diabetes mellitus with hyperglycemia: Secondary | ICD-10-CM | POA: Diagnosis not present

## 2014-06-26 DIAGNOSIS — Z0000 Encounter for general adult medical examination without abnormal findings: Secondary | ICD-10-CM | POA: Diagnosis not present

## 2014-06-26 DIAGNOSIS — IMO0002 Reserved for concepts with insufficient information to code with codable children: Secondary | ICD-10-CM

## 2014-06-26 DIAGNOSIS — Z23 Encounter for immunization: Secondary | ICD-10-CM

## 2014-06-26 DIAGNOSIS — D485 Neoplasm of uncertain behavior of skin: Secondary | ICD-10-CM
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
## 2014-06-27 LAB — HEMOGLOBIN A1C
Hgb A1c MFr Bld: 8 % — ABNORMAL HIGH (ref <5.7)
Mean Plasma Glucose: 183 mg/dL — ABNORMAL HIGH (ref <117)

## 2014-06-27 LAB — PSA, MEDICARE: PSA: 1.52 ng/mL (ref <=4.00)

## 2014-06-27 LAB — MICROALBUMIN, URINE: Microalb, Ur: 0.6 mg/dL (ref <2.0)

## 2014-07-03 ENCOUNTER — Telehealth: Payer: Self-pay

## 2014-07-03 ENCOUNTER — Other Ambulatory Visit: Payer: Self-pay | Admitting: Family Medicine

## 2014-07-03 ENCOUNTER — Other Ambulatory Visit: Payer: Self-pay

## 2014-07-03 DIAGNOSIS — Z1211 Encounter for screening for malignant neoplasm of colon: Secondary | ICD-10-CM

## 2014-07-03 MED ORDER — GLIPIZIDE ER 10 MG PO TB24: 10.0000 mg | ORAL_TABLET | Freq: Every day | ORAL | Status: DC

## 2014-07-03 MED ORDER — METFORMIN HCL 1000 MG PO TABS: 1000.0000 mg | ORAL_TABLET | Freq: Two times a day (BID) | ORAL | Status: DC

## 2014-07-04 NOTE — Telephone Encounter (Signed)
Gastroenterology Pre-Procedure Review  Request Date: 07/30/2014 Requesting Physician: Dr. [name]  PATIENT REVIEW QUESTIONS: The patient responded to the following health history questions as indicated:    1. Diabetes Melitis:  YES 2. Joint replacements in the past 12 months: no 3. Major health problems in the past 3 months: no 4. Has an artificial valve or MVP: no 5. Has a defibrillator: no 6. Has been advised in past to take antibiotics in advance of a procedure like teeth cleaning: no    MEDICATIONS & ALLERGIES:    Patient reports the following regarding taking any blood thinners:   Plavix? no Aspirin? no Coumadin? no  Patient confirms/reports the following medications:  Current Outpatient Prescriptions  Medication Sig Dispense Refill  . ALPRAZolam (XANAX) 0.5 MG tablet Take 1 tablet (0.5 mg total) by mouth 3 (three) times daily as needed. For anxiety 90 tablet 2  . Dulaglutide (TRULICITY) 1.5 YF/7.4BS SOPN Inject 1 Dose as directed once a week. 4 pen 5  . fluticasone (FLONASE) 50 MCG/ACT nasal spray Place 2 sprays into both nostrils daily. 16 g 6  . glipiZIDE-metformin (METAGLIP) 5-500 MG per tablet Take 1 tablet by mouth 2 (two) times daily before a meal.    . losartan (COZAAR) 50 MG tablet Take 1 tablet (50 mg total) by mouth at bedtime. 30 tablet 3  . pioglitazone (ACTOS) 45 MG tablet TAKE 1 TABLET BY MOUTH EVERY NIGHT AT BEDTIME 30 tablet 2  . simvastatin (ZOCOR) 40 MG tablet TAKE 1 TABLET BY MOUTH DAILY AT 6PM 30 tablet 2  . ACCU-CHEK AVIVA PLUS test strip 2 (two) times daily. for testing  0  . aspirin 81 MG tablet Take 81 mg by mouth daily.      Marland Kitchen glipiZIDE (GLUCOTROL XL) 10 MG 24 hr tablet Take 1 tablet (10 mg total) by mouth daily with breakfast. (Patient not taking: Reported on 07/03/2014) 30 tablet 3  . meloxicam (MOBIC) 15 MG tablet Take 1 tablet (15 mg total) by mouth daily. (Patient not taking: Reported on 07/03/2014) 30 tablet 0  . metFORMIN (GLUCOPHAGE) 1000 MG  tablet Take 1 tablet (1,000 mg total) by mouth 2 (two) times daily with a meal. (Patient not taking: Reported on 07/04/2014) 60 tablet 3   No current facility-administered medications for this visit.    Patient confirms/reports the following allergies:  Allergies  Allergen Reactions  . Invokana [Canagliflozin]     Rash, tachycardia     No orders of the defined types were placed in this encounter.    AUTHORIZATION INFORMATION Primary Insurance:   ID #:  Group #: Pre-Cert / Auth required:  Pre-Cert / Auth #:   Secondary Insurance:   ID #:   Group #:  Pre-Cert / Auth required:  Pre-Cert / Auth #:   SCHEDULE INFORMATION: Procedure has been scheduled as follows:  Date:  07/30/2014                     Time:  11:30 AM Location: [facility] Short Stay  This Gastroenterology Pre-Precedure Review Form is being routed to the following provider(s): R. Garfield Cornea, MD

## 2014-07-09 NOTE — Telephone Encounter (Signed)
1/2 dose of oral diabetes medication the day before, none the day of.

## 2014-07-17 MED ORDER — PEG-KCL-NACL-NASULF-NA ASC-C 100 G PO SOLR: 1.0000 | ORAL | Status: DC

## 2014-07-17 NOTE — Telephone Encounter (Signed)
Noted  

## 2014-07-17 NOTE — Telephone Encounter (Signed)
Sorry [name] [name], you had already signed off.

## 2014-07-17 NOTE — Telephone Encounter (Signed)
Gastroenterology Pre-Procedure Review  Request Date: 07/03/2014 Requesting Physician: Dr. [name]  PATIENT REVIEW QUESTIONS: The patient responded to the following health history questions as indicated:    1. Diabetes Melitis: YES 2. Joint replacements in the past 12 months: no 3. Major health problems in the past 3 months: no 4. Has an artificial valve or MVP: no 5. Has a defibrillator: no 6. Has been advised in past to take antibiotics in advance of a procedure like teeth cleaning: no    MEDICATIONS & ALLERGIES:    Patient reports the following regarding taking any blood thinners:   Plavix? no Aspirin? no Coumadin? no  Patient confirms/reports the following medications:  Current Outpatient Prescriptions  Medication Sig Dispense Refill  . ALPRAZolam (XANAX) 0.5 MG tablet Take 1 tablet (0.5 mg total) by mouth 3 (three) times daily as needed. For anxiety 90 tablet 2  . Dulaglutide (TRULICITY) 1.5 TJ/0.3ES SOPN Inject 1 Dose as directed once a week. 4 pen 5  . fluticasone (FLONASE) 50 MCG/ACT nasal spray Place 2 sprays into both nostrils daily. 16 g 6  . glipiZIDE-metformin (METAGLIP) 5-500 MG per tablet Take 1 tablet by mouth 2 (two) times daily before a meal.    . losartan (COZAAR) 50 MG tablet Take 1 tablet (50 mg total) by mouth at bedtime. 30 tablet 3  . pioglitazone (ACTOS) 45 MG tablet TAKE 1 TABLET BY MOUTH EVERY NIGHT AT BEDTIME 30 tablet 2  . simvastatin (ZOCOR) 40 MG tablet TAKE 1 TABLET BY MOUTH DAILY AT 6PM 30 tablet 2  . ACCU-CHEK AVIVA PLUS test strip 2 (two) times daily. for testing  0  . aspirin 81 MG tablet Take 81 mg by mouth daily.      Marland Kitchen glipiZIDE (GLUCOTROL XL) 10 MG 24 hr tablet Take 1 tablet (10 mg total) by mouth daily with breakfast. (Patient not taking: Reported on 07/03/2014) 30 tablet 3  . meloxicam (MOBIC) 15 MG tablet Take 1 tablet (15 mg total) by mouth daily. (Patient not taking: Reported on 07/03/2014) 30 tablet 0  . metFORMIN (GLUCOPHAGE) 1000 MG tablet  Take 1 tablet (1,000 mg total) by mouth 2 (two) times daily with a meal. (Patient not taking: Reported on 07/04/2014) 60 tablet 3   No current facility-administered medications for this visit.    Patient confirms/reports the following allergies:  Allergies  Allergen Reactions  . Invokana [Canagliflozin]     Rash, tachycardia     No orders of the defined types were placed in this encounter.    AUTHORIZATION INFORMATION Primary Insurance:   ID #:   Group #:  Pre-Cert / Auth required:  Pre-Cert / Auth #:   Secondary Insurance:   ID #:  Group #:  Pre-Cert / Auth required:  Pre-Cert / Auth #:   SCHEDULE INFORMATION: Procedure has been scheduled as follows:  Date: 07/30/2014              Time:  11:30 Am Location: [facility] Short Stay  This Gastroenterology Pre-Precedure Review Form is being routed to the following provider(s): R. Garfield Cornea, MD

## 2014-07-17 NOTE — Telephone Encounter (Signed)
Rx sent to the pharmacy and instructions mailed to pt.  

## 2014-07-23 ENCOUNTER — Other Ambulatory Visit: Payer: Self-pay | Admitting: Physician Assistant

## 2014-07-23 NOTE — Telephone Encounter (Signed)
Approved. # 90 + 2. 

## 2014-07-23 NOTE — Telephone Encounter (Signed)
LRF 01/10/14 #90 + 2.  LOV 06/26/14  OK refill?

## 2014-07-24 ENCOUNTER — Telehealth: Payer: Self-pay

## 2014-07-24 NOTE — Telephone Encounter (Signed)
Medication refilled per protocol. 

## 2014-07-24 NOTE — Telephone Encounter (Signed)
I called [employer] @ [phone] and got the automation, PA not required for out patient screening colonoscopy.

## 2014-07-30 ENCOUNTER — Encounter (HOSPITAL_COMMUNITY): Payer: Self-pay

## 2014-07-30 ENCOUNTER — Ambulatory Visit (HOSPITAL_COMMUNITY)
Admission: RE | Admit: 2014-07-30 | Discharge: 2014-07-30 | Disposition: A | Discharge status: Home / Self Care | Payer: Medicare HMO | Source: Ambulatory Visit | Attending: Internal Medicine | Admitting: Internal Medicine

## 2014-07-30 ENCOUNTER — Encounter (HOSPITAL_COMMUNITY)
Admission: RE | Disposition: A | Discharge status: Home / Self Care | Payer: Self-pay | Source: Ambulatory Visit | Attending: Internal Medicine

## 2014-07-30 DIAGNOSIS — Z1211 Encounter for screening for malignant neoplasm of colon: Secondary | ICD-10-CM | POA: Diagnosis present

## 2014-07-30 DIAGNOSIS — E119 Type 2 diabetes mellitus without complications: Secondary | ICD-10-CM | POA: Diagnosis not present

## 2014-07-30 DIAGNOSIS — Z860100 Personal history of colon polyps, unspecified: Secondary | ICD-10-CM | POA: Insufficient documentation

## 2014-07-30 DIAGNOSIS — K635 Polyp of colon: Secondary | ICD-10-CM

## 2014-07-30 DIAGNOSIS — F419 Anxiety disorder, unspecified: Secondary | ICD-10-CM | POA: Diagnosis not present

## 2014-07-30 DIAGNOSIS — D124 Benign neoplasm of descending colon: Secondary | ICD-10-CM | POA: Insufficient documentation

## 2014-07-30 DIAGNOSIS — D125 Benign neoplasm of sigmoid colon: Secondary | ICD-10-CM | POA: Diagnosis not present

## 2014-07-30 DIAGNOSIS — D128 Benign neoplasm of rectum: Secondary | ICD-10-CM | POA: Diagnosis not present

## 2014-07-30 DIAGNOSIS — E785 Hyperlipidemia, unspecified: Secondary | ICD-10-CM | POA: Diagnosis not present

## 2014-07-30 DIAGNOSIS — Z791 Long term (current) use of non-steroidal anti-inflammatories (NSAID): Secondary | ICD-10-CM | POA: Insufficient documentation

## 2014-07-30 DIAGNOSIS — Z79899 Other long term (current) drug therapy: Secondary | ICD-10-CM | POA: Insufficient documentation

## 2014-07-30 DIAGNOSIS — F17210 Nicotine dependence, cigarettes, uncomplicated: Secondary | ICD-10-CM | POA: Diagnosis not present

## 2014-07-30 DIAGNOSIS — Z7982 Long term (current) use of aspirin: Secondary | ICD-10-CM | POA: Insufficient documentation

## 2014-07-30 DIAGNOSIS — I10 Essential (primary) hypertension: Secondary | ICD-10-CM | POA: Insufficient documentation

## 2014-07-30 DIAGNOSIS — K621 Rectal polyp: Secondary | ICD-10-CM

## 2014-07-30 HISTORY — PX: COLONOSCOPY: SHX5424

## 2014-07-30 LAB — GLUCOSE, CAPILLARY: Glucose-Capillary: 114 mg/dL — ABNORMAL HIGH (ref 65–99)

## 2014-07-30 SURGERY — COLONOSCOPY
Anesthesia: Moderate Sedation

## 2014-07-30 MED ORDER — STERILE WATER FOR IRRIGATION IR SOLN
Status: DC | PRN
  Administered 2014-07-30: 11:00:00

## 2014-07-30 MED ORDER — MEPERIDINE HCL 100 MG/ML IJ SOLN
INTRAMUSCULAR | Status: DC | PRN
  Administered 2014-07-30: 25 mg via INTRAVENOUS
  Administered 2014-07-30: 50 mg via INTRAVENOUS
  Administered 2014-07-30: 25 mg via INTRAVENOUS

## 2014-07-30 MED ORDER — MIDAZOLAM HCL 5 MG/5ML IJ SOLN
INTRAMUSCULAR | Status: DC | PRN
Start: 2014-07-30 — End: 2014-07-30
  Administered 2014-07-30: 1 mg via INTRAVENOUS
  Administered 2014-07-30 (×2): 2 mg via INTRAVENOUS
  Administered 2014-07-30 (×2): 1 mg via INTRAVENOUS

## 2014-07-30 MED ORDER — SODIUM CHLORIDE 0.9 % IV SOLN
INTRAVENOUS | Status: DC
  Administered 2014-07-30: 11:00:00 via INTRAVENOUS

## 2014-07-30 MED ORDER — MEPERIDINE HCL 100 MG/ML IJ SOLN
INTRAMUSCULAR | Status: AC
  Filled 2014-07-30: qty 2

## 2014-07-30 MED ORDER — ONDANSETRON HCL 4 MG/2ML IJ SOLN
INTRAMUSCULAR | Status: DC | PRN
  Administered 2014-07-30: 4 mg via INTRAVENOUS

## 2014-07-30 MED ORDER — ONDANSETRON HCL 4 MG/2ML IJ SOLN
INTRAMUSCULAR | Status: AC
  Filled 2014-07-30: qty 2

## 2014-07-30 MED ORDER — MIDAZOLAM HCL 5 MG/5ML IJ SOLN
INTRAMUSCULAR | Status: AC
  Filled 2014-07-30: qty 10
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
## 2014-07-30 NOTE — Op Note (Signed)
[facility] [address] [place], [address]   COLONOSCOPY PROCEDURE REPORT  PATIENT: [name], [name]  MR#: [id number] BIRTHDATE: 07/21/1940 , 74  yrs. old GENDER: male ENDOSCOPIST: R.  Garfield Cornea, MD FACP [facility] REFERRED HR:CBULAG [name], M.D. PROCEDURE DATE:  08/14/2014 PROCEDURE:   Colonoscopy with snare polypectomy and ablation INDICATIONS:First ever average risk colorectal cancer screening examination. MEDICATIONS: Versed 7 mg IV and Demerol 100 mg IV in divided doses. Zofran 4 mg IV. ASA CLASS:       Class II  CONSENT: The risks, benefits, alternatives and imponderables including but not limited to bleeding, perforation as well as the possibility of a missed lesion have been reviewed.  The potential for biopsy, lesion removal, etc. have also been discussed. Questions have been answered.  All parties agreeable.  Please see the history and physical in the medical record for more information.  DESCRIPTION OF PROCEDURE:   After the risks benefits and alternatives of the procedure were thoroughly explained, informed consent was obtained.  The digital rectal exam revealed no abnormalities of the rectum.   The EC-3890Li ([id number])  endoscope was introduced through the anus and advanced to the cecum, which was identified by both the appendix and ileocecal valve. No adverse events experienced.   The quality of the prep was adequate  The instrument was then slowly withdrawn as the colon was fully examined. Estimated blood loss is zero unless otherwise noted in this procedure report.      COLON FINDINGS: (1) 4 mm polyp in the rectum at 10 cm; otherwise, the remainder of the colonic mucosa appeared normal.  The patient had (1) 8 mm pedunculated polyp in the mid sigmoid segment with an adjacent 4 mm polyp.  There were multiple diminutive polyps present in the segment.  There was one 6 mm polyp in the mid descending segment; otherwise, the remainder of the  colonic mucosa appeared normal.  The rectal polyp was cold snare removed.  The larger sigmoid polyps were hot snare removed.  The descending colon polyp was hot snare removed.  There were multiple diminutive polyps in the sigmoid segment which were ablated with the hot snare loop.  Retroflexion was performed. Marland Kitchen EBL 5 mL Withdrawal time=20 minutes 0 seconds.  The scope was withdrawn and the procedure completed. COMPLICATIONS: There were no immediate complications.  ENDOSCOPIC IMPRESSION: Multiple colonic polyps removed and/or ablated as described above.  RECOMMENDATIONS: Follow-up on pathology.  eSigned:  R. Garfield Cornea, MD [facility] 2014/08/14 11:59 AM   cc:  CPT CODES: ICD CODES:  The ICD and CPT codes recommended by this software are interpretations from the data that the clinical staff has captured with the software.  The verification of the translation of this report to the ICD and CPT codes and modifiers is the sole responsibility of the health care institution and practicing physician where this report was generated.  [employer]. will not be held responsible for the validity of the ICD and CPT codes included on this report.  AMA assumes no liability for data contained or not contained herein. CPT is a Designer, television/film set of the [employer].  PATIENT NAME:  [name], [name] MR#: [id number]

## 2014-07-30 NOTE — Discharge Instructions (Signed)
Colonoscopy Discharge Instructions  Read the instructions outlined below and refer to this sheet in the next few weeks. These discharge instructions provide you with general information on caring for yourself after you leave the hospital. Your doctor may also give you specific instructions. While your treatment has been planned according to the most current medical practices available, unavoidable complications occasionally occur. If you have any problems or questions after discharge, call Dr. Gala Romney at [phone]. ACTIVITY  You may resume your regular activity, but move at a slower pace for the next 24 hours.   Take frequent rest periods for the next 24 hours.   Walking will help get rid of the air and reduce the bloated feeling in your belly (abdomen).   No driving for 24 hours (because of the medicine (anesthesia) used during the test).    Do not sign any important legal documents or operate any machinery for 24 hours (because of the anesthesia used during the test).  NUTRITION  Drink plenty of fluids.   You may resume your normal diet as instructed by your doctor.   Begin with a light meal and progress to your normal diet. Heavy or fried foods are harder to digest and may make you feel sick to your stomach (nauseated).   Avoid alcoholic beverages for 24 hours or as instructed.  MEDICATIONS  You may resume your normal medications unless your doctor tells you otherwise.  WHAT YOU CAN EXPECT TODAY  Some feelings of bloating in the abdomen.   Passage of more gas than usual.   Spotting of blood in your stool or on the toilet paper.  IF YOU HAD POLYPS REMOVED DURING THE COLONOSCOPY:  No aspirin products for 7 days or as instructed.   No alcohol for 7 days or as instructed.   Eat a soft diet for the next 24 hours.  FINDING OUT THE RESULTS OF YOUR TEST Not all test results are available during your visit. If your test results are not back during the visit, make an appointment  with your caregiver to find out the results. Do not assume everything is normal if you have not heard from your caregiver or the medical facility. It is important for you to follow up on all of your test results.  SEEK IMMEDIATE MEDICAL ATTENTION IF:  You have more than a spotting of blood in your stool.   Your belly is swollen (abdominal distention).   You are nauseated or vomiting.   You have a temperature over 101.   You have abdominal pain or discomfort that is severe or gets worse throughout the day.    Follow-up information provided  Further recommendations to follow pending review of pathology report  Colon Polyps Polyps are lumps of extra tissue growing inside the body. Polyps can grow in the large intestine (colon). Most colon polyps are noncancerous (benign). However, some colon polyps can become cancerous over time. Polyps that are larger than a pea may be harmful. To be safe, caregivers remove and test all polyps. CAUSES  Polyps form when mutations in the genes cause your cells to grow and divide even though no more tissue is needed. RISK FACTORS There are a number of risk factors that can increase your chances of getting colon polyps. They include:  Being older than 50 years.  Family history of colon polyps or colon cancer.  Long-term colon diseases, such as colitis or Crohn disease.  Being overweight.  Smoking.  Being inactive.  Drinking too much alcohol. SYMPTOMS  Most small polyps do not cause symptoms. If symptoms are present, they may include:  Blood in the stool. The stool may look dark red or black.  Constipation or diarrhea that lasts longer than 1 week. DIAGNOSIS People often do not know they have polyps until their caregiver finds them during a regular checkup. Your caregiver can use 4 tests to check for polyps:  Digital rectal exam. The caregiver wears gloves and feels inside the rectum. This test would find polyps only in the rectum.  Barium  enema. The caregiver puts a liquid called barium into your rectum before taking X-rays of your colon. Barium makes your colon look white. Polyps are dark, so they are easy to see in the X-ray pictures.  Sigmoidoscopy. A thin, flexible tube (sigmoidoscope) is placed into your rectum. The sigmoidoscope has a light and tiny camera in it. The caregiver uses the sigmoidoscope to look at the last third of your colon.  Colonoscopy. This test is like sigmoidoscopy, but the caregiver looks at the entire colon. This is the most common method for finding and removing polyps. TREATMENT  Any polyps will be removed during a sigmoidoscopy or colonoscopy. The polyps are then tested for cancer. PREVENTION  To help lower your risk of getting more colon polyps:  Eat plenty of fruits and vegetables. Avoid eating fatty foods.  Do not smoke.  Avoid drinking alcohol.  Exercise every day.  Lose weight if recommended by your caregiver.  Eat plenty of calcium and folate. Foods that are rich in calcium include milk, cheese, and broccoli. Foods that are rich in folate include chickpeas, kidney beans, and spinach. HOME CARE INSTRUCTIONS Keep all follow-up appointments as directed by your caregiver. You may need periodic exams to check for polyps. SEEK MEDICAL CARE IF: You notice bleeding during a bowel movement.

## 2014-07-31 ENCOUNTER — Encounter: Payer: Self-pay | Admitting: Internal Medicine

## 2014-07-31 ENCOUNTER — Encounter (HOSPITAL_COMMUNITY): Payer: Self-pay | Admitting: Internal Medicine

## 2014-08-02 ENCOUNTER — Encounter: Payer: Self-pay | Admitting: Family Medicine

## 2014-09-03 ENCOUNTER — Other Ambulatory Visit: Payer: Self-pay | Admitting: Physician Assistant

## 2014-09-04 NOTE — Telephone Encounter (Signed)
Medication refilled per protocol. 

## 2014-09-13 ENCOUNTER — Telehealth: Payer: Self-pay | Admitting: *Deleted

## 2014-09-13 NOTE — Telephone Encounter (Signed)
Pt wife called stating pt needs sample of Trulicity 1.5mg , I gave 1 box of sample and put pt name on it.

## 2014-10-04 ENCOUNTER — Encounter: Payer: Self-pay | Admitting: Physician Assistant

## 2014-10-15 ENCOUNTER — Encounter (HOSPITAL_COMMUNITY): Payer: Self-pay | Admitting: Emergency Medicine

## 2014-10-15 ENCOUNTER — Emergency Department (HOSPITAL_COMMUNITY)
Admission: EM | Admit: 2014-10-15 | Discharge: 2014-10-15 | Disposition: A | Discharge status: Home / Self Care | Payer: Medicare HMO | Attending: Emergency Medicine | Admitting: Emergency Medicine

## 2014-10-15 DIAGNOSIS — I10 Essential (primary) hypertension: Secondary | ICD-10-CM | POA: Insufficient documentation

## 2014-10-15 DIAGNOSIS — J209 Acute bronchitis, unspecified: Secondary | ICD-10-CM | POA: Insufficient documentation

## 2014-10-15 DIAGNOSIS — E119 Type 2 diabetes mellitus without complications: Secondary | ICD-10-CM | POA: Insufficient documentation

## 2014-10-15 DIAGNOSIS — Z7982 Long term (current) use of aspirin: Secondary | ICD-10-CM | POA: Insufficient documentation

## 2014-10-15 DIAGNOSIS — Z872 Personal history of diseases of the skin and subcutaneous tissue: Secondary | ICD-10-CM | POA: Insufficient documentation

## 2014-10-15 DIAGNOSIS — Z87828 Personal history of other (healed) physical injury and trauma: Secondary | ICD-10-CM | POA: Insufficient documentation

## 2014-10-15 DIAGNOSIS — F419 Anxiety disorder, unspecified: Secondary | ICD-10-CM | POA: Diagnosis not present

## 2014-10-15 DIAGNOSIS — Z86010 Personal history of colonic polyps: Secondary | ICD-10-CM | POA: Diagnosis not present

## 2014-10-15 DIAGNOSIS — E785 Hyperlipidemia, unspecified: Secondary | ICD-10-CM | POA: Insufficient documentation

## 2014-10-15 DIAGNOSIS — Z7951 Long term (current) use of inhaled steroids: Secondary | ICD-10-CM | POA: Diagnosis not present

## 2014-10-15 DIAGNOSIS — Z79899 Other long term (current) drug therapy: Secondary | ICD-10-CM | POA: Insufficient documentation

## 2014-10-15 DIAGNOSIS — Z720 Tobacco use: Secondary | ICD-10-CM

## 2014-10-15 DIAGNOSIS — R05 Cough: Secondary | ICD-10-CM | POA: Diagnosis present

## 2014-10-15 MED ORDER — ALBUTEROL SULFATE HFA 108 (90 BASE) MCG/ACT IN AERS
2.0000 | INHALATION_SPRAY | RESPIRATORY_TRACT | Status: DC | PRN
  Administered 2014-10-15: 2 via RESPIRATORY_TRACT
  Filled 2014-10-15: qty 6.7

## 2014-10-15 MED ORDER — AMOXICILLIN-POT CLAVULANATE 875-125 MG PO TABS: 1.0000 | ORAL_TABLET | Freq: Two times a day (BID) | ORAL | Status: DC

## 2014-10-15 MED ORDER — AEROCHAMBER Z-STAT PLUS/MEDIUM MISC: 1.0000 | Freq: Once | Status: DC

## 2014-10-15 MED ORDER — PREDNISONE 20 MG PO TABS: 20.0000 mg | ORAL_TABLET | Freq: Two times a day (BID) | ORAL | Status: DC

## 2014-10-15 NOTE — ED Notes (Signed)
Patient given d/c papers and prescriptions given and  reviewed. Questions answered. Patient verbalized understanding. RT at bedside providing inhaler and education.

## 2014-10-15 NOTE — ED Notes (Signed)
Patient complaining of cough and nasal congestion x 1 week. States coughing up thick white sputum.

## 2014-10-15 NOTE — ED Notes (Signed)
RT called
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
## 2014-10-15 NOTE — ED Notes (Signed)
RT at bedside.

## 2014-10-15 NOTE — Discharge Instructions (Signed)
Use the inhaler 2 puffs every 3 or 4 hours as needed for cough or trouble breathing. Try to stop smoking.   Acute Bronchitis Bronchitis is inflammation of the airways that extend from the windpipe into the lungs (bronchi). The inflammation often causes mucus to develop. This leads to a cough, which is the most common symptom of bronchitis.  In acute bronchitis, the condition usually develops suddenly and goes away over time, usually in a couple weeks. Smoking, allergies, and asthma can make bronchitis worse. Repeated episodes of bronchitis may cause further lung problems.  CAUSES Acute bronchitis is most often caused by the same virus that causes a cold. The virus can spread from person to person (contagious) through coughing, sneezing, and touching contaminated objects. SIGNS AND SYMPTOMS   Cough.   Fever.   Coughing up mucus.   Body aches.   Chest congestion.   Chills.   Shortness of breath.   Sore throat.  DIAGNOSIS  Acute bronchitis is usually diagnosed through a physical exam. Your health care provider will also ask you questions about your medical history. Tests, such as chest X-rays, are sometimes done to rule out other conditions.  TREATMENT  Acute bronchitis usually goes away in a couple weeks. Oftentimes, no medical treatment is necessary. Medicines are sometimes given for relief of fever or cough. Antibiotic medicines are usually not needed but may be prescribed in certain situations. In some cases, an inhaler may be recommended to help reduce shortness of breath and control the cough. A cool mist vaporizer may also be used to help thin bronchial secretions and make it easier to clear the chest.  HOME CARE INSTRUCTIONS  Get plenty of rest.   Drink enough fluids to keep your urine clear or pale yellow (unless you have a medical condition that requires fluid restriction). Increasing fluids may help thin your respiratory secretions (sputum) and reduce chest  congestion, and it will prevent dehydration.   Take medicines only as directed by your health care provider.  If you were prescribed an antibiotic medicine, finish it all even if you start to feel better.  Avoid smoking and secondhand smoke. Exposure to cigarette smoke or irritating chemicals will make bronchitis worse. If you are a smoker, consider using nicotine gum or skin patches to help control withdrawal symptoms. Quitting smoking will help your lungs heal faster.   Reduce the chances of another bout of acute bronchitis by washing your hands frequently, avoiding people with cold symptoms, and trying not to touch your hands to your mouth, nose, or eyes.   Keep all follow-up visits as directed by your health care provider.  SEEK MEDICAL CARE IF: Your symptoms do not improve after 1 week of treatment.  SEEK IMMEDIATE MEDICAL CARE IF:  You develop an increased fever or chills.   You have chest pain.   You have severe shortness of breath.  You have bloody sputum.   You develop dehydration.  You faint or repeatedly feel like you are going to pass out.  You develop repeated vomiting.  You develop a severe headache. MAKE SURE YOU:   Understand these instructions.  Will watch your condition.  Will get help right away if you are not doing well or get worse. Document Released: 02/06/2004 Document Revised: 05/15/2013 Document Reviewed: 06/21/2012 [facility] Patient Information 2015 [place], [place]. This information is not intended to replace advice given to you by your health care provider. Make sure you discuss any questions you have with your health care provider.  Smoking Cessation Quitting smoking is important to your health and has many advantages. However, it is not always easy to quit since nicotine is a very addictive drug. Oftentimes, people try 3 times or more before being able to quit. This document explains the best ways for you to prepare to quit smoking. Quitting  takes hard work and a lot of effort, but you can do it. ADVANTAGES OF QUITTING SMOKING  You will live longer, feel better, and live better.  Your body will feel the impact of quitting smoking almost immediately.  Within 20 minutes, blood pressure decreases. Your pulse returns to its normal level.  After 8 hours, carbon monoxide levels in the blood return to normal. Your oxygen level increases.  After 24 hours, the chance of having a heart attack starts to decrease. Your breath, hair, and body stop smelling like smoke.  After 48 hours, damaged nerve endings begin to recover. Your sense of taste and smell improve.  After 72 hours, the body is virtually free of nicotine. Your bronchial tubes relax and breathing becomes easier.  After 2 to 12 weeks, lungs can hold more air. Exercise becomes easier and circulation improves.  The risk of having a heart attack, stroke, cancer, or lung disease is greatly reduced.  After 1 year, the risk of coronary heart disease is cut in half.  After 5 years, the risk of stroke falls to the same as a nonsmoker.  After 10 years, the risk of lung cancer is cut in half and the risk of other cancers decreases significantly.  After 15 years, the risk of coronary heart disease drops, usually to the level of a nonsmoker.  If you are pregnant, quitting smoking will improve your chances of having a healthy baby.  The people you live with, especially any children, will be healthier.  You will have extra money to spend on things other than cigarettes. QUESTIONS TO THINK ABOUT BEFORE ATTEMPTING TO QUIT You may want to talk about your answers with your health care provider.  Why do you want to quit?  If you tried to quit in the past, what helped and what did not?  What will be the most difficult situations for you after you quit? How will you plan to handle them?  Who can help you through the tough times? Your family? Friends? A health care provider?  What  pleasures do you get from smoking? What ways can you still get pleasure if you quit? Here are some questions to ask your health care provider:  How can you help me to be successful at quitting?  What medicine do you think would be best for me and how should I take it?  What should I do if I need more help?  What is smoking withdrawal like? How can I get information on withdrawal? GET READY  Set a quit date.  Change your environment by getting rid of all cigarettes, ashtrays, matches, and lighters in your home, car, or work. Do not let people smoke in your home.  Review your past attempts to quit. Think about what worked and what did not. GET SUPPORT AND ENCOURAGEMENT You have a better chance of being successful if you have help. You can get support in many ways.  Tell your family, friends, and coworkers that you are going to quit and need their support. Ask them not to smoke around you.  Get individual, group, or telephone counseling and support. Programs are available at [employer] and health centers.  Call your local health department for information about programs in your area.  Spiritual beliefs and practices may help some smokers quit.  Download a "quit meter" on your computer to keep track of quit statistics, such as how long you have gone without smoking, cigarettes not smoked, and money saved.  Get a self-help book about quitting smoking and staying off tobacco. Eleele yourself from urges to smoke. Talk to someone, go for a walk, or occupy your time with a task.  Change your normal routine. Take a different route to work. Drink tea instead of coffee. Eat breakfast in a different place.  Reduce your stress. Take a hot bath, exercise, or read a book.  Plan something enjoyable to do every day. Reward yourself for not smoking.  Explore interactive web-based programs that specialize in helping you quit. GET MEDICINE AND USE IT  CORRECTLY Medicines can help you stop smoking and decrease the urge to smoke. Combining medicine with the above behavioral methods and support can greatly increase your chances of successfully quitting smoking.  Nicotine replacement therapy helps deliver nicotine to your body without the negative effects and risks of smoking. Nicotine replacement therapy includes nicotine gum, lozenges, inhalers, nasal sprays, and skin patches. Some may be available over-the-counter and others require a prescription.  Antidepressant medicine helps people abstain from smoking, but how this works is unknown. This medicine is available by prescription.  Nicotinic receptor partial agonist medicine simulates the effect of nicotine in your brain. This medicine is available by prescription. Ask your health care provider for advice about which medicines to use and how to use them based on your health history. Your health care provider will tell you what side effects to look out for if you choose to be on a medicine or therapy. Carefully read the information on the package. Do not use any other product containing nicotine while using a nicotine replacement product.  RELAPSE OR DIFFICULT SITUATIONS Most relapses occur within the first 3 months after quitting. Do not be discouraged if you start smoking again. Remember, most people try several times before finally quitting. You may have symptoms of withdrawal because your body is used to nicotine. You may crave cigarettes, be irritable, feel very hungry, cough often, get headaches, or have difficulty concentrating. The withdrawal symptoms are only temporary. They are strongest when you first quit, but they will go away within 10-14 days. To reduce the chances of relapse, try to:  Avoid drinking alcohol. Drinking lowers your chances of successfully quitting.  Reduce the amount of caffeine you consume. Once you quit smoking, the amount of caffeine in your body increases and can  give you symptoms, such as a rapid heartbeat, sweating, and anxiety.  Avoid smokers because they can make you want to smoke.  Do not let weight gain distract you. Many smokers will gain weight when they quit, usually less than 10 pounds. Eat a healthy diet and stay active. You can always lose the weight gained after you quit.  Find ways to improve your mood other than smoking. FOR MORE INFORMATION  www.smokefree.gov  Document Released: 12/23/2000 Document Revised: 05/15/2013 Document Reviewed: 04/09/2011 [facility] Patient Information 2015 [place], [place]. This information is not intended to replace advice given to you by your health care provider. Make sure you discuss any questions you have with your health care provider.

## 2014-10-22 ENCOUNTER — Other Ambulatory Visit: Payer: Self-pay | Admitting: Physician Assistant

## 2014-10-22 ENCOUNTER — Other Ambulatory Visit: Payer: Self-pay | Admitting: Family Medicine

## 2014-10-22 ENCOUNTER — Encounter: Payer: Self-pay | Admitting: Family Medicine

## 2014-10-22 ENCOUNTER — Ambulatory Visit (INDEPENDENT_AMBULATORY_CARE_PROVIDER_SITE_OTHER): Payer: Medicare HMO | Admitting: Family Medicine

## 2014-10-22 VITALS — BP 128/78 | HR 94 | Temp 98.0°F | Resp 18 | Wt 141.0 lb

## 2014-10-22 DIAGNOSIS — D485 Neoplasm of uncertain behavior of skin: Secondary | ICD-10-CM

## 2014-10-22 DIAGNOSIS — I10 Essential (primary) hypertension: Secondary | ICD-10-CM | POA: Diagnosis not present

## 2014-10-22 DIAGNOSIS — F17200 Nicotine dependence, unspecified, uncomplicated: Secondary | ICD-10-CM

## 2014-10-22 DIAGNOSIS — E785 Hyperlipidemia, unspecified: Secondary | ICD-10-CM | POA: Diagnosis not present

## 2014-10-22 DIAGNOSIS — IMO0001 Reserved for inherently not codable concepts without codable children: Secondary | ICD-10-CM

## 2014-10-22 DIAGNOSIS — Z23 Encounter for immunization: Secondary | ICD-10-CM

## 2014-10-22 DIAGNOSIS — E1165 Type 2 diabetes mellitus with hyperglycemia: Secondary | ICD-10-CM

## 2014-10-22 DIAGNOSIS — Z720 Tobacco use: Secondary | ICD-10-CM

## 2014-10-22 LAB — COMPLETE METABOLIC PANEL WITH GFR
ALK PHOS: 49 U/L (ref 40–115)
ALT: 17 U/L (ref 9–46)
AST: 12 U/L (ref 10–35)
Albumin: 3.8 g/dL (ref 3.6–5.1)
BUN: 11 mg/dL (ref 7–25)
CALCIUM: 9.1 mg/dL (ref 8.6–10.3)
CO2: 29 mmol/L (ref 20–31)
CREATININE: 0.67 mg/dL — AB (ref 0.70–1.18)
Chloride: 100 mmol/L (ref 98–110)
GFR, Est African American: >89 mL/min (ref >=60)
GFR, Est Non African American: >89 mL/min (ref >=60)
Glucose, Bld: 181 mg/dL — ABNORMAL HIGH (ref 70–99)
POTASSIUM: 4.1 mmol/L (ref 3.5–5.3)
Sodium: 137 mmol/L (ref 135–146)
Total Bilirubin: 0.4 mg/dL (ref 0.2–1.2)
Total Protein: 6.2 g/dL (ref 6.1–8.1)

## 2014-10-22 LAB — CBC WITH DIFFERENTIAL/PLATELET
BASOS PCT: 1 % (ref 0–1)
Basophils Absolute: 0.1 10*3/uL (ref 0.0–0.1)
EOS PCT: 2 % (ref 0–5)
Eosinophils Absolute: 0.2 10*3/uL (ref 0.0–0.7)
HCT: 41.7 % (ref 39.0–52.0)
Hemoglobin: 13.9 g/dL (ref 13.0–17.0)
Lymphocytes Relative: 38 % (ref 12–46)
Lymphs Abs: 4.7 10*3/uL — ABNORMAL HIGH (ref 0.7–4.0)
MCH: 30.9 pg (ref 26.0–34.0)
MCHC: 33.3 g/dL (ref 30.0–36.0)
MCV: 92.7 fL (ref 78.0–100.0)
MONO ABS: 1 10*3/uL (ref 0.1–1.0)
MPV: 9.3 fL (ref 8.6–12.4)
Monocytes Relative: 8 % (ref 3–12)
Neutro Abs: 6.3 10*3/uL (ref 1.7–7.7)
Neutrophils Relative %: 51 % (ref 43–77)
Platelets: 297 10*3/uL (ref 150–400)
RBC: 4.5 MIL/uL (ref 4.22–5.81)
RDW: 14.2 % (ref 11.5–15.5)
WBC: 12.3 10*3/uL — AB (ref 4.0–10.5)

## 2014-10-22 LAB — LIPID PANEL
CHOLESTEROL: 134 mg/dL (ref 125–200)
HDL: 43 mg/dL (ref >=40)
LDL CALC: 61 mg/dL (ref <130)
TRIGLYCERIDES: 152 mg/dL — AB (ref <150)
Total CHOL/HDL Ratio: 3.1 Ratio (ref <=5.0)
VLDL: 30 mg/dL (ref <30)
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
## 2014-10-23 ENCOUNTER — Telehealth: Payer: Self-pay | Admitting: *Deleted

## 2014-10-23 LAB — MICROALBUMIN, URINE: Microalb, Ur: 0.2 mg/dL (ref <2.0)

## 2014-10-23 MED ORDER — NAPROXEN 500 MG PO TABS: 500.0000 mg | ORAL_TABLET | Freq: Two times a day (BID) | ORAL | Status: DC

## 2014-10-23 NOTE — Addendum Note (Signed)
Addended by: Sheral Flow on: 10/23/2014 02:14 PM   Modules accepted: Orders

## 2014-10-23 NOTE — Telephone Encounter (Signed)
Received call from patient wife, [name].   Reports that patient was seen in office on 10/22/2014 and she thought Augmentin and Naproxen were to be refilled.   States that pharmacy has not received refill at this time.   Ok to refill?

## 2014-10-23 NOTE — Telephone Encounter (Signed)
Ok augmentin 875 bid for 10 days

## 2014-10-23 NOTE — Telephone Encounter (Addendum)
Call placed to patient.   States that he is requesting refill on  ABTx for sinus infection that was started in ER.   Reports that he continues to have pressure and nasal congestion.

## 2014-10-23 NOTE — Telephone Encounter (Signed)
Patient wife states that she thought MD stated infection was not cleared at this time and refill would be sent in.

## 2014-10-23 NOTE — Telephone Encounter (Signed)
I am okay with naproxen.  Why does he need augmentin.

## 2014-10-23 NOTE — Telephone Encounter (Signed)
Please call husband.  Ask which infection he was treated for and what symptoms he is having.  I do not have a recollection of this discussion.

## 2014-10-24 ENCOUNTER — Other Ambulatory Visit: Payer: Self-pay | Admitting: Family Medicine

## 2014-10-24 ENCOUNTER — Other Ambulatory Visit: Payer: Self-pay | Admitting: Physician Assistant

## 2014-10-24 LAB — HEMOGLOBIN A1C
Hgb A1c MFr Bld: 8.4 % — ABNORMAL HIGH (ref <5.7)
Mean Plasma Glucose: 194 mg/dL — ABNORMAL HIGH (ref <117)

## 2014-10-24 MED ORDER — AMOXICILLIN-POT CLAVULANATE 875-125 MG PO TABS: 1.0000 | ORAL_TABLET | Freq: Two times a day (BID) | ORAL | Status: DC

## 2014-10-24 NOTE — Telephone Encounter (Signed)
Call placed to patient and patient made aware.   Prescription sent to pharmacy.  

## 2014-10-24 NOTE — Addendum Note (Signed)
Addended by: Sheral Flow on: 10/24/2014 07:50 AM   Modules accepted: Orders

## 2014-10-25 ENCOUNTER — Telehealth: Payer: Self-pay | Admitting: *Deleted

## 2014-10-25 MED ORDER — INSULIN GLARGINE 100 UNIT/ML SOLOSTAR PEN: 15.0000 [IU] | PEN_INJECTOR | Freq: Every day | SUBCUTANEOUS | Status: DC

## 2014-10-25 NOTE — Telephone Encounter (Signed)
-----   Message from [name], MD sent at 10/25/2014  7:37 AM EDT ----- Hga1c is 8.4 and still uncontrolled.  I would stop trulicity and begin lantus at 15 units sq qday and increase insulin by one unit every day until fbs are less than 130.

## 2014-10-25 NOTE — Telephone Encounter (Signed)
Prescription sent to pharmacy. .   Call placed to patient and patient made aware.  

## 2014-10-25 NOTE — Telephone Encounter (Signed)
Refill denied.   Medication changed on 10/25/2014.

## 2014-10-25 NOTE — Telephone Encounter (Signed)
Refill appropriate and filled per protocol. 

## 2014-10-26 ENCOUNTER — Telehealth: Payer: Self-pay | Admitting: Family Medicine

## 2014-10-26 NOTE — Telephone Encounter (Signed)
Rec'd PA request for Lantus solostar.  Submitted thru [facility] case [id number]

## 2014-10-26 NOTE — Telephone Encounter (Signed)
Rec'd approval for Lantus solostar pen.  Ref# [id number] from 01/10/2014 thru 01/12/2016.  Approval faxed to pharmacy

## 2014-10-29 ENCOUNTER — Telehealth: Payer: Self-pay | Admitting: Physician Assistant

## 2014-10-29 DIAGNOSIS — E119 Type 2 diabetes mellitus without complications: Secondary | ICD-10-CM

## 2014-10-29 MED ORDER — PEN NEEDLES 31G X 8 MM MISC: 1.0000 | Freq: Every day | Status: DC

## 2014-10-29 NOTE — Telephone Encounter (Signed)
Pen needles refilled 

## 2014-10-29 NOTE — Telephone Encounter (Signed)
[name] [name] called to let us know that we need to call in the needles to go with the Lantis pen. They use the [facility] in [place]

## 2014-10-30 ENCOUNTER — Telehealth: Payer: Self-pay | Admitting: Family Medicine

## 2014-10-30 NOTE — Telephone Encounter (Signed)
[employer] concerned pt not taking 4 of his medications correctly.  Pioglitazone, Trulicity, Simvastatin and Losartan.  Trulicity has been discontinued.  I called the patient and he assured me he is taking all of his medications as prescribed.

## 2014-11-05 ENCOUNTER — Encounter: Payer: Self-pay | Admitting: Physician Assistant

## 2014-11-08 ENCOUNTER — Ambulatory Visit (INDEPENDENT_AMBULATORY_CARE_PROVIDER_SITE_OTHER): Payer: Medicare HMO | Admitting: Family Medicine

## 2014-11-08 ENCOUNTER — Encounter: Payer: Self-pay | Admitting: Family Medicine

## 2014-11-08 VITALS — BP 142/84 | HR 92 | Temp 98.1°F | Resp 18 | Ht 69.0 in | Wt 145.0 lb

## 2014-11-08 DIAGNOSIS — E1165 Type 2 diabetes mellitus with hyperglycemia: Secondary | ICD-10-CM

## 2014-11-08 DIAGNOSIS — IMO0001 Reserved for inherently not codable concepts without codable children: Secondary | ICD-10-CM
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
## 2014-11-10 ENCOUNTER — Other Ambulatory Visit: Payer: Self-pay | Admitting: Family Medicine

## 2014-12-14 ENCOUNTER — Emergency Department (HOSPITAL_COMMUNITY): Payer: Medicare HMO

## 2014-12-14 ENCOUNTER — Emergency Department (HOSPITAL_COMMUNITY)
Admission: EM | Admit: 2014-12-14 | Discharge: 2014-12-14 | Disposition: A | Discharge status: Home / Self Care | Payer: Medicare HMO | Attending: Emergency Medicine | Admitting: Emergency Medicine

## 2014-12-14 ENCOUNTER — Encounter (HOSPITAL_COMMUNITY): Payer: Self-pay

## 2014-12-14 DIAGNOSIS — Z872 Personal history of diseases of the skin and subcutaneous tissue: Secondary | ICD-10-CM | POA: Diagnosis not present

## 2014-12-14 DIAGNOSIS — F17210 Nicotine dependence, cigarettes, uncomplicated: Secondary | ICD-10-CM | POA: Insufficient documentation

## 2014-12-14 DIAGNOSIS — E119 Type 2 diabetes mellitus without complications: Secondary | ICD-10-CM | POA: Insufficient documentation

## 2014-12-14 DIAGNOSIS — Z7951 Long term (current) use of inhaled steroids: Secondary | ICD-10-CM | POA: Diagnosis not present

## 2014-12-14 DIAGNOSIS — E785 Hyperlipidemia, unspecified: Secondary | ICD-10-CM | POA: Insufficient documentation

## 2014-12-14 DIAGNOSIS — Z7982 Long term (current) use of aspirin: Secondary | ICD-10-CM | POA: Diagnosis not present

## 2014-12-14 DIAGNOSIS — I10 Essential (primary) hypertension: Secondary | ICD-10-CM | POA: Diagnosis not present

## 2014-12-14 DIAGNOSIS — F419 Anxiety disorder, unspecified: Secondary | ICD-10-CM | POA: Diagnosis not present

## 2014-12-14 DIAGNOSIS — Z87828 Personal history of other (healed) physical injury and trauma: Secondary | ICD-10-CM | POA: Diagnosis not present

## 2014-12-14 DIAGNOSIS — Z794 Long term (current) use of insulin: Secondary | ICD-10-CM | POA: Insufficient documentation

## 2014-12-14 DIAGNOSIS — M778 Other enthesopathies, not elsewhere classified: Secondary | ICD-10-CM

## 2014-12-14 DIAGNOSIS — Z7984 Long term (current) use of oral hypoglycemic drugs: Secondary | ICD-10-CM | POA: Diagnosis not present

## 2014-12-14 DIAGNOSIS — Z79899 Other long term (current) drug therapy: Secondary | ICD-10-CM | POA: Insufficient documentation

## 2014-12-14 DIAGNOSIS — G8929 Other chronic pain: Secondary | ICD-10-CM | POA: Diagnosis not present

## 2014-12-14 DIAGNOSIS — Z791 Long term (current) use of non-steroidal anti-inflammatories (NSAID): Secondary | ICD-10-CM | POA: Insufficient documentation

## 2014-12-14 DIAGNOSIS — M25531 Pain in right wrist: Secondary | ICD-10-CM | POA: Diagnosis present

## 2014-12-14 DIAGNOSIS — Z86010 Personal history of colonic polyps: Secondary | ICD-10-CM | POA: Insufficient documentation

## 2014-12-14 HISTORY — DX: Other chronic pain: G89.29

## 2014-12-14 HISTORY — DX: Pain in unspecified hand: M79.643

## 2014-12-14 MED ORDER — HYDROCODONE-ACETAMINOPHEN 5-325 MG PO TABS: ORAL_TABLET | ORAL | Status: DC
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
## 2014-12-14 NOTE — ED Notes (Signed)
Pt states understanding of care given and follow up instructions 

## 2014-12-17 ENCOUNTER — Encounter: Payer: Self-pay | Admitting: Family Medicine

## 2014-12-17 ENCOUNTER — Ambulatory Visit (INDEPENDENT_AMBULATORY_CARE_PROVIDER_SITE_OTHER): Payer: Medicare HMO | Admitting: Family Medicine

## 2014-12-17 VITALS — BP 110/60 | HR 78 | Temp 97.8°F | Resp 16 | Ht 69.0 in | Wt 150.0 lb

## 2014-12-17 DIAGNOSIS — M25531 Pain in right wrist: Secondary | ICD-10-CM

## 2014-12-17 DIAGNOSIS — M545 Low back pain: Secondary | ICD-10-CM

## 2014-12-17 MED ORDER — CELECOXIB 200 MG PO CAPS: 200.0000 mg | ORAL_CAPSULE | Freq: Every day | ORAL | Status: DC
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
## 2015-01-15 ENCOUNTER — Other Ambulatory Visit: Payer: Self-pay | Admitting: Physician Assistant

## 2015-01-15 NOTE — Telephone Encounter (Signed)
Rx called in 

## 2015-01-15 NOTE — Telephone Encounter (Signed)
ok 

## 2015-01-15 NOTE — Telephone Encounter (Signed)
LRF 07/24/14  #90 +2.  LOV 12/17/14  OK refill?

## 2015-01-26 ENCOUNTER — Emergency Department (HOSPITAL_COMMUNITY): Payer: PPO

## 2015-01-26 ENCOUNTER — Encounter (HOSPITAL_COMMUNITY): Payer: Self-pay | Admitting: Emergency Medicine

## 2015-01-26 ENCOUNTER — Emergency Department (HOSPITAL_COMMUNITY)
Admission: EM | Admit: 2015-01-26 | Discharge: 2015-01-26 | Disposition: A | Discharge status: Home / Self Care | Payer: PPO | Attending: Emergency Medicine | Admitting: Emergency Medicine

## 2015-01-26 DIAGNOSIS — Z7951 Long term (current) use of inhaled steroids: Secondary | ICD-10-CM | POA: Insufficient documentation

## 2015-01-26 DIAGNOSIS — F17210 Nicotine dependence, cigarettes, uncomplicated: Secondary | ICD-10-CM | POA: Diagnosis not present

## 2015-01-26 DIAGNOSIS — Z79899 Other long term (current) drug therapy: Secondary | ICD-10-CM | POA: Diagnosis not present

## 2015-01-26 DIAGNOSIS — Z872 Personal history of diseases of the skin and subcutaneous tissue: Secondary | ICD-10-CM | POA: Diagnosis not present

## 2015-01-26 DIAGNOSIS — Z794 Long term (current) use of insulin: Secondary | ICD-10-CM | POA: Diagnosis not present

## 2015-01-26 DIAGNOSIS — I10 Essential (primary) hypertension: Secondary | ICD-10-CM | POA: Insufficient documentation

## 2015-01-26 DIAGNOSIS — G8929 Other chronic pain: Secondary | ICD-10-CM | POA: Diagnosis not present

## 2015-01-26 DIAGNOSIS — Z791 Long term (current) use of non-steroidal anti-inflammatories (NSAID): Secondary | ICD-10-CM | POA: Diagnosis not present

## 2015-01-26 DIAGNOSIS — F419 Anxiety disorder, unspecified: Secondary | ICD-10-CM | POA: Insufficient documentation

## 2015-01-26 DIAGNOSIS — Y9389 Activity, other specified: Secondary | ICD-10-CM | POA: Insufficient documentation

## 2015-01-26 DIAGNOSIS — W228XXA Striking against or struck by other objects, initial encounter: Secondary | ICD-10-CM | POA: Insufficient documentation

## 2015-01-26 DIAGNOSIS — S60221A Contusion of right hand, initial encounter: Secondary | ICD-10-CM | POA: Diagnosis not present

## 2015-01-26 DIAGNOSIS — Y9289 Other specified places as the place of occurrence of the external cause: Secondary | ICD-10-CM | POA: Insufficient documentation

## 2015-01-26 DIAGNOSIS — S59911A Unspecified injury of right forearm, initial encounter: Secondary | ICD-10-CM | POA: Diagnosis not present

## 2015-01-26 DIAGNOSIS — Z7984 Long term (current) use of oral hypoglycemic drugs: Secondary | ICD-10-CM | POA: Insufficient documentation

## 2015-01-26 DIAGNOSIS — E785 Hyperlipidemia, unspecified: Secondary | ICD-10-CM | POA: Diagnosis not present

## 2015-01-26 DIAGNOSIS — Z7982 Long term (current) use of aspirin: Secondary | ICD-10-CM | POA: Insufficient documentation

## 2015-01-26 DIAGNOSIS — S6991XA Unspecified injury of right wrist, hand and finger(s), initial encounter: Secondary | ICD-10-CM | POA: Diagnosis not present

## 2015-01-26 DIAGNOSIS — M79631 Pain in right forearm: Secondary | ICD-10-CM | POA: Diagnosis not present

## 2015-01-26 DIAGNOSIS — Y998 Other external cause status: Secondary | ICD-10-CM | POA: Insufficient documentation

## 2015-01-26 DIAGNOSIS — Z86010 Personal history of colonic polyps: Secondary | ICD-10-CM | POA: Insufficient documentation

## 2015-01-26 DIAGNOSIS — E119 Type 2 diabetes mellitus without complications: Secondary | ICD-10-CM | POA: Insufficient documentation

## 2015-01-26 MED ORDER — HYDROCODONE-ACETAMINOPHEN 5-325 MG PO TABS
2.0000 | ORAL_TABLET | Freq: Once | ORAL | Status: AC
  Administered 2015-01-26: 2 via ORAL
  Filled 2015-01-26: qty 2

## 2015-01-26 MED ORDER — HYDROCODONE-ACETAMINOPHEN 5-325 MG PO TABS: 2.0000 | ORAL_TABLET | ORAL | Status: DC | PRN
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
## 2015-01-26 NOTE — Discharge Instructions (Signed)
Hand Contusion  A hand contusion is a deep bruise on your hand area. Contusions are the result of an injury that caused bleeding under the skin. The contusion may turn blue, purple, or yellow. Minor injuries will give you a painless contusion, but more severe contusions may stay painful and swollen for a few weeks.  CAUSES   A contusion is usually caused by a blow, trauma, or direct force to an area of the body.  SYMPTOMS    Swelling and redness of the injured area.   Discoloration of the injured area.   Tenderness and soreness of the injured area.   Pain.  DIAGNOSIS   The diagnosis can be made by taking a history and performing a physical exam. An X-ray, CT scan, or MRI may be needed to determine if there were any associated injuries, such as broken bones (fractures).  TREATMENT   Often, the best treatment for a hand contusion is resting, elevating, icing, and applying cold compresses to the injured area. Over-the-counter medicines may also be recommended for pain control.  HOME CARE INSTRUCTIONS    Put ice on the injured area.    Put ice in a plastic bag.    Place a towel between your skin and the bag.    Leave the ice on for 15-20 minutes, 03-04 times a day.   Only take over-the-counter or prescription medicines as directed by your caregiver. Your caregiver may recommend avoiding anti-inflammatory medicines (aspirin, ibuprofen, and naproxen) for 48 hours because these medicines may increase bruising.   If told, use an elastic wrap as directed. This can help reduce swelling. You may remove the wrap for sleeping, showering, and bathing. If your fingers become numb, cold, or blue, take the wrap off and reapply it more loosely.   Elevate your hand with pillows to reduce swelling.   Avoid overusing your hand if it is painful.  SEEK IMMEDIATE MEDICAL CARE IF:    You have increased redness, swelling, or pain in your hand.   Your swelling or pain is not relieved with medicines.   You have loss of feeling in  your hand or are unable to move your fingers.   Your hand turns cold or blue.   You have pain when you move your fingers.   Your hand becomes warm to the touch.   Your contusion does not improve in 2 days.  MAKE SURE YOU:    Understand these instructions.   Will watch your condition.   Will get help right away if you are not doing well or get worse.     This information is not intended to replace advice given to you by your health care provider. Make sure you discuss any questions you have with your health care provider.     Document Released: 06/20/2001 Document Revised: 09/23/2011 Document Reviewed: 06/22/2011  Elsevier Interactive Patient Education 2016 [employer]
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
## 2015-01-31 ENCOUNTER — Telehealth: Payer: Self-pay | Admitting: Physician Assistant

## 2015-01-31 NOTE — Telephone Encounter (Signed)
Spoke to pt already and wife aware xray still valid

## 2015-01-31 NOTE — Telephone Encounter (Signed)
Patient calling to see if the order for xray of his shoulder is still valid  Please call him at [phone]

## 2015-02-05 ENCOUNTER — Other Ambulatory Visit: Payer: Self-pay | Admitting: Physician Assistant

## 2015-02-13 ENCOUNTER — Other Ambulatory Visit: Payer: Self-pay | Admitting: Family Medicine

## 2015-02-14 ENCOUNTER — Other Ambulatory Visit: Payer: Self-pay | Admitting: Family Medicine

## 2015-02-14 ENCOUNTER — Telehealth: Payer: Self-pay | Admitting: Physician Assistant

## 2015-02-14 DIAGNOSIS — M545 Low back pain: Secondary | ICD-10-CM

## 2015-02-14 NOTE — Telephone Encounter (Signed)
Spoke to radiology dept at [facility] and order was clarified

## 2015-02-14 NOTE — Telephone Encounter (Signed)
Patient calling to ask question about the xray that should be in place for him  [phone]

## 2015-02-15 ENCOUNTER — Ambulatory Visit (HOSPITAL_COMMUNITY)
Admission: RE | Admit: 2015-02-15 | Discharge: 2015-02-15 | Disposition: A | Discharge status: Home / Self Care | Payer: PPO | Source: Ambulatory Visit | Attending: Family Medicine | Admitting: Family Medicine

## 2015-02-15 DIAGNOSIS — M5137 Other intervertebral disc degeneration, lumbosacral region: Secondary | ICD-10-CM | POA: Diagnosis not present

## 2015-02-15 DIAGNOSIS — M545 Low back pain: Secondary | ICD-10-CM | POA: Diagnosis not present

## 2015-02-15 NOTE — Telephone Encounter (Signed)
Patient would like to have xray results faxed to ortho doc sent to dr [name] when receive results

## 2015-02-15 NOTE — Telephone Encounter (Signed)
Dr [name] in [place],  Results will be available to him

## 2015-02-19 ENCOUNTER — Other Ambulatory Visit: Payer: Self-pay | Admitting: Physician Assistant

## 2015-02-19 DIAGNOSIS — M48061 Spinal stenosis, lumbar region without neurogenic claudication: Secondary | ICD-10-CM

## 2015-02-19 DIAGNOSIS — M549 Dorsalgia, unspecified: Secondary | ICD-10-CM

## 2015-03-04 ENCOUNTER — Other Ambulatory Visit: Payer: Self-pay | Admitting: Family Medicine

## 2015-03-05 ENCOUNTER — Other Ambulatory Visit: Payer: Self-pay | Admitting: Family Medicine

## 2015-03-05 ENCOUNTER — Encounter: Payer: Self-pay | Admitting: Family Medicine

## 2015-03-05 NOTE — Telephone Encounter (Signed)
Medication refill for one time only.  Patient needs to be seen.  Letter sent for patient to call and schedule 

## 2015-03-07 ENCOUNTER — Ambulatory Visit (INDEPENDENT_AMBULATORY_CARE_PROVIDER_SITE_OTHER): Payer: PPO | Admitting: Orthopaedic Surgery

## 2015-03-07 ENCOUNTER — Ambulatory Visit (INDEPENDENT_AMBULATORY_CARE_PROVIDER_SITE_OTHER): Payer: PPO

## 2015-03-07 ENCOUNTER — Encounter: Payer: Self-pay | Admitting: Orthopaedic Surgery

## 2015-03-07 VITALS — BP 121/71 | HR 92 | Temp 99.0°F | Resp 16 | Ht 71.0 in | Wt 145.0 lb

## 2015-03-07 DIAGNOSIS — M5441 Lumbago with sciatica, right side: Secondary | ICD-10-CM | POA: Diagnosis not present

## 2015-03-07 DIAGNOSIS — M25551 Pain in right hip: Secondary | ICD-10-CM

## 2015-03-07 MED ORDER — DICLOFENAC SODIUM 75 MG PO TBEC: 75.0000 mg | DELAYED_RELEASE_TABLET | Freq: Two times a day (BID) | ORAL | Status: DC

## 2015-03-07 MED ORDER — HYDROCODONE-ACETAMINOPHEN 5-325 MG PO TABS: 1.0000 | ORAL_TABLET | ORAL | Status: DC | PRN
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
## 2015-03-07 NOTE — Patient Instructions (Addendum)
Pick your medicine up at the pharmacy  Smoking Cessation, Tips for Success If you are ready to quit smoking, congratulations! You have chosen to help yourself be healthier. Cigarettes bring nicotine, tar, carbon monoxide, and other irritants into your body. Your lungs, heart, and blood vessels will be able to work better without these poisons. There are many different ways to quit smoking. Nicotine gum, nicotine patches, a nicotine inhaler, or nicotine nasal spray can help with physical craving. Hypnosis, support groups, and medicines help break the habit of smoking. WHAT THINGS CAN I DO TO MAKE QUITTING EASIER?  Here are some tips to help you quit for good:  Pick a date when you will quit smoking completely. Tell all of your friends and family about your plan to quit on that date.  Do not try to slowly cut down on the number of cigarettes you are smoking. Pick a quit date and quit smoking completely starting on that day.  Throw away all cigarettes.   Clean and remove all ashtrays from your home, work, and car.  On a card, write down your reasons for quitting. Carry the card with you and read it when you get the urge to smoke.  Cleanse your body of nicotine. Drink enough water and fluids to keep your urine clear or pale yellow. Do this after quitting to flush the nicotine from your body.  Learn to predict your moods. Do not let a bad situation be your excuse to have a cigarette. Some situations in your life might tempt you into wanting a cigarette.  Never have "just one" cigarette. It leads to wanting another and another. Remind yourself of your decision to quit.  Change habits associated with smoking. If you smoked while driving or when feeling stressed, try other activities to replace smoking. Stand up when drinking your coffee. Brush your teeth after eating. Sit in a different chair when you read the paper. Avoid alcohol while trying to quit, and try to drink fewer caffeinated beverages.  Alcohol and caffeine may urge you to smoke.  Avoid foods and drinks that can trigger a desire to smoke, such as sugary or spicy foods and alcohol.  Ask people who smoke not to smoke around you.  Have something planned to do right after eating or having a cup of coffee. For example, plan to take a walk or exercise.  Try a relaxation exercise to calm you down and decrease your stress. Remember, you may be tense and nervous for the first 2 weeks after you quit, but this will pass.  Find new activities to keep your hands busy. Play with a pen, coin, or rubber band. Doodle or draw things on paper.  Brush your teeth right after eating. This will help cut down on the craving for the taste of tobacco after meals. You can also try mouthwash.   Use oral substitutes in place of cigarettes. Try using lemon drops, carrots, cinnamon sticks, or chewing gum. Keep them handy so they are available when you have the urge to smoke.  When you have the urge to smoke, try deep breathing.  Designate your home as a nonsmoking area.  If you are a heavy smoker, ask your health care provider about a prescription for nicotine chewing gum. It can ease your withdrawal from nicotine.  Reward yourself. Set aside the cigarette money you save and buy yourself something nice.  Look for support from others. Join a support group or smoking cessation program. Ask someone at home or  at work to help you with your plan to quit smoking.  Always ask yourself, "Do I need this cigarette or is this just a reflex?" Tell yourself, "Today, I choose not to smoke," or "I do not want to smoke." You are reminding yourself of your decision to quit.  Do not replace cigarette smoking with electronic cigarettes (commonly called e-cigarettes). The safety of e-cigarettes is unknown, and some may contain harmful chemicals.  If you relapse, do not give up! Plan ahead and think about what you will do the next time you get the urge to smoke. HOW  WILL I FEEL WHEN I QUIT SMOKING? You may have symptoms of withdrawal because your body is used to nicotine (the addictive substance in cigarettes). You may crave cigarettes, be irritable, feel very hungry, cough often, get headaches, or have difficulty concentrating. The withdrawal symptoms are only temporary. They are strongest when you first quit but will go away within 10-14 days. When withdrawal symptoms occur, stay in control. Think about your reasons for quitting. Remind yourself that these are signs that your body is healing and getting used to being without cigarettes. Remember that withdrawal symptoms are easier to treat than the major diseases that smoking can cause.  Even after the withdrawal is over, expect periodic urges to smoke. However, these cravings are generally short lived and will go away whether you smoke or not. Do not smoke! WHAT RESOURCES ARE AVAILABLE TO HELP ME QUIT SMOKING? Your health care provider can direct you to community resources or hospitals for support, which may include:  Group support.  Education.  Hypnosis.  Therapy.   This information is not intended to replace advice given to you by your health care provider. Make sure you discuss any questions you have with your health care provider.   Document Released: 09/27/2003 Document Revised: 01/19/2014 Document Reviewed: 06/16/2012 Elsevier Interactive Patient Education [employer].

## 2015-03-15 ENCOUNTER — Encounter: Payer: Self-pay | Admitting: Family Medicine

## 2015-03-15 ENCOUNTER — Ambulatory Visit (INDEPENDENT_AMBULATORY_CARE_PROVIDER_SITE_OTHER): Payer: PPO | Admitting: Family Medicine

## 2015-03-15 VITALS — BP 140/60 | HR 86 | Temp 97.9°F | Resp 18 | Wt 151.0 lb

## 2015-03-15 DIAGNOSIS — E119 Type 2 diabetes mellitus without complications: Secondary | ICD-10-CM

## 2015-03-15 DIAGNOSIS — Z794 Long term (current) use of insulin: Secondary | ICD-10-CM

## 2015-03-15 DIAGNOSIS — I10 Essential (primary) hypertension: Secondary | ICD-10-CM

## 2015-03-15 DIAGNOSIS — F17200 Nicotine dependence, unspecified, uncomplicated: Secondary | ICD-10-CM

## 2015-03-15 DIAGNOSIS — R0989 Other specified symptoms and signs involving the circulatory and respiratory systems: Secondary | ICD-10-CM

## 2015-03-15 DIAGNOSIS — E785 Hyperlipidemia, unspecified: Secondary | ICD-10-CM | POA: Diagnosis not present

## 2015-03-15 DIAGNOSIS — Z720 Tobacco use: Secondary | ICD-10-CM | POA: Diagnosis not present
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
## 2015-03-16 LAB — HEMOGLOBIN A1C
HEMOGLOBIN A1C: 8 % — AB (ref <5.7)
MEAN PLASMA GLUCOSE: 183 mg/dL — AB (ref <117)

## 2015-03-16 LAB — COMPLETE METABOLIC PANEL WITH GFR
ALBUMIN: 3.9 g/dL (ref 3.6–5.1)
ALK PHOS: 49 U/L (ref 40–115)
ALT: 16 U/L (ref 9–46)
AST: 17 U/L (ref 10–35)
BILIRUBIN TOTAL: 0.3 mg/dL (ref 0.2–1.2)
BUN: 15 mg/dL (ref 7–25)
CALCIUM: 9.3 mg/dL (ref 8.6–10.3)
CHLORIDE: 98 mmol/L (ref 98–110)
CO2: 24 mmol/L (ref 20–31)
CREATININE: 0.98 mg/dL (ref 0.70–1.18)
GFR, EST AFRICAN AMERICAN: 88 mL/min (ref >=60)
GFR, Est Non African American: 76 mL/min (ref >=60)
Glucose, Bld: 195 mg/dL — ABNORMAL HIGH (ref 70–99)
Potassium: 3.7 mmol/L (ref 3.5–5.3)
Sodium: 134 mmol/L — ABNORMAL LOW (ref 135–146)
Total Protein: 5.9 g/dL — ABNORMAL LOW (ref 6.1–8.1)

## 2015-03-16 LAB — MICROALBUMIN, URINE: Microalb, Ur: <0.2 mg/dL

## 2015-03-22 ENCOUNTER — Other Ambulatory Visit: Payer: Self-pay | Admitting: Family Medicine

## 2015-03-22 MED ORDER — SITAGLIPTIN PHOSPHATE 100 MG PO TABS: 100.0000 mg | ORAL_TABLET | Freq: Every day | ORAL | Status: DC

## 2015-03-25 ENCOUNTER — Other Ambulatory Visit: Payer: Self-pay | Admitting: Physician Assistant

## 2015-03-25 DIAGNOSIS — R0989 Other specified symptoms and signs involving the circulatory and respiratory systems: Secondary | ICD-10-CM

## 2015-03-27 ENCOUNTER — Ambulatory Visit (HOSPITAL_COMMUNITY)
Admission: RE | Admit: 2015-03-27 | Discharge: 2015-03-27 | Disposition: A | Discharge status: Home / Self Care | Payer: PPO | Source: Ambulatory Visit | Attending: Family Medicine | Admitting: Family Medicine

## 2015-03-27 DIAGNOSIS — I6523 Occlusion and stenosis of bilateral carotid arteries: Secondary | ICD-10-CM | POA: Diagnosis not present

## 2015-03-27 DIAGNOSIS — R0989 Other specified symptoms and signs involving the circulatory and respiratory systems: Secondary | ICD-10-CM | POA: Diagnosis not present

## 2015-03-28 ENCOUNTER — Other Ambulatory Visit: Payer: Self-pay | Admitting: Family Medicine

## 2015-03-28 DIAGNOSIS — I6523 Occlusion and stenosis of bilateral carotid arteries: Secondary | ICD-10-CM

## 2015-03-29 ENCOUNTER — Encounter: Payer: Self-pay | Admitting: *Deleted

## 2015-03-29 ENCOUNTER — Encounter: Payer: Self-pay | Admitting: Family Medicine

## 2015-04-02 ENCOUNTER — Encounter: Payer: Self-pay | Admitting: Orthopaedic Surgery

## 2015-04-02 ENCOUNTER — Ambulatory Visit (INDEPENDENT_AMBULATORY_CARE_PROVIDER_SITE_OTHER): Payer: PPO | Admitting: Orthopaedic Surgery

## 2015-04-02 VITALS — BP 170/65 | HR 79 | Temp 97.9°F | Resp 16 | Ht 71.0 in | Wt 150.0 lb

## 2015-04-02 DIAGNOSIS — M5441 Lumbago with sciatica, right side: Secondary | ICD-10-CM

## 2015-04-02 MED ORDER — HYDROCODONE-ACETAMINOPHEN 5-325 MG PO TABS: 1.0000 | ORAL_TABLET | ORAL | Status: DC | PRN
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
## 2015-04-02 NOTE — Patient Instructions (Signed)
Smoking Cessation, Tips for Success If you are ready to quit smoking, congratulations! You have chosen to help yourself be healthier. Cigarettes bring nicotine, tar, carbon monoxide, and other irritants into your body. Your lungs, heart, and blood vessels will be able to work better without these poisons. There are many different ways to quit smoking. Nicotine gum, nicotine patches, a nicotine inhaler, or nicotine nasal spray can help with physical craving. Hypnosis, support groups, and medicines help break the habit of smoking. WHAT THINGS CAN I DO TO MAKE QUITTING EASIER?  Here are some tips to help you quit for good:  Pick a date when you will quit smoking completely. Tell all of your friends and family about your plan to quit on that date.  Do not try to slowly cut down on the number of cigarettes you are smoking. Pick a quit date and quit smoking completely starting on that day.  Throw away all cigarettes.   Clean and remove all ashtrays from your home, work, and car.  On a card, write down your reasons for quitting. Carry the card with you and read it when you get the urge to smoke.  Cleanse your body of nicotine. Drink enough water and fluids to keep your urine clear or pale yellow. Do this after quitting to flush the nicotine from your body.  Learn to predict your moods. Do not let a bad situation be your excuse to have a cigarette. Some situations in your life might tempt you into wanting a cigarette.  Never have "just one" cigarette. It leads to wanting another and another. Remind yourself of your decision to quit.  Change habits associated with smoking. If you smoked while driving or when feeling stressed, try other activities to replace smoking. Stand up when drinking your coffee. Brush your teeth after eating. Sit in a different chair when you read the paper. Avoid alcohol while trying to quit, and try to drink fewer caffeinated beverages. Alcohol and caffeine may urge you to  smoke.  Avoid foods and drinks that can trigger a desire to smoke, such as sugary or spicy foods and alcohol.  Ask people who smoke not to smoke around you.  Have something planned to do right after eating or having a cup of coffee. For example, plan to take a walk or exercise.  Try a relaxation exercise to calm you down and decrease your stress. Remember, you may be tense and nervous for the first 2 weeks after you quit, but this will pass.  Find new activities to keep your hands busy. Play with a pen, coin, or rubber band. Doodle or draw things on paper.  Brush your teeth right after eating. This will help cut down on the craving for the taste of tobacco after meals. You can also try mouthwash.   Use oral substitutes in place of cigarettes. Try using lemon drops, carrots, cinnamon sticks, or chewing gum. Keep them handy so they are available when you have the urge to smoke.  When you have the urge to smoke, try deep breathing.  Designate your home as a nonsmoking area.  If you are a heavy smoker, ask your health care provider about a prescription for nicotine chewing gum. It can ease your withdrawal from nicotine.  Reward yourself. Set aside the cigarette money you save and buy yourself something nice.  Look for support from others. Join a support group or smoking cessation program. Ask someone at home or at work to help you with your plan   to quit smoking.  Always ask yourself, "Do I need this cigarette or is this just a reflex?" Tell yourself, "Today, I choose not to smoke," or "I do not want to smoke." You are reminding yourself of your decision to quit.  Do not replace cigarette smoking with electronic cigarettes (commonly called e-cigarettes). The safety of e-cigarettes is unknown, and some may contain harmful chemicals.  If you relapse, do not give up! Plan ahead and think about what you will do the next time you get the urge to smoke. HOW WILL I FEEL WHEN I QUIT SMOKING? You  may have symptoms of withdrawal because your body is used to nicotine (the addictive substance in cigarettes). You may crave cigarettes, be irritable, feel very hungry, cough often, get headaches, or have difficulty concentrating. The withdrawal symptoms are only temporary. They are strongest when you first quit but will go away within 10-14 days. When withdrawal symptoms occur, stay in control. Think about your reasons for quitting. Remind yourself that these are signs that your body is healing and getting used to being without cigarettes. Remember that withdrawal symptoms are easier to treat than the major diseases that smoking can cause.  Even after the withdrawal is over, expect periodic urges to smoke. However, these cravings are generally short lived and will go away whether you smoke or not. Do not smoke! WHAT RESOURCES ARE AVAILABLE TO HELP ME QUIT SMOKING? Your health care provider can direct you to community resources or hospitals for support, which may include:  Group support.  Education.  Hypnosis.  Therapy.   This information is not intended to replace advice given to you by your health care provider. Make sure you discuss any questions you have with your health care provider.   Document Released: 09/27/2003 Document Revised: 01/19/2014 Document Reviewed: 06/16/2012 Elsevier Interactive Patient Education 2016 [employer]  

## 2015-04-04 ENCOUNTER — Encounter: Payer: Self-pay | Admitting: Vascular Surgery

## 2015-04-05 ENCOUNTER — Telehealth: Payer: Self-pay | Admitting: *Deleted

## 2015-04-05 NOTE — Telephone Encounter (Signed)
Submitted [facility] referral thru [facility] for authorization on 03/29/15 to Dr. 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

## 2015-04-12 ENCOUNTER — Ambulatory Visit (INDEPENDENT_AMBULATORY_CARE_PROVIDER_SITE_OTHER): Payer: PPO | Admitting: Vascular Surgery

## 2015-04-12 ENCOUNTER — Encounter: Payer: Self-pay | Admitting: Vascular Surgery

## 2015-04-12 ENCOUNTER — Other Ambulatory Visit: Payer: Self-pay | Admitting: *Deleted

## 2015-04-12 VITALS — BP 144/77 | HR 81 | Ht 71.0 in | Wt 149.1 lb

## 2015-04-12 DIAGNOSIS — I739 Peripheral vascular disease, unspecified: Principal | ICD-10-CM

## 2015-04-12 DIAGNOSIS — I6523 Occlusion and stenosis of bilateral carotid arteries: Secondary | ICD-10-CM

## 2015-04-12 DIAGNOSIS — I779 Disorder of arteries and arterioles, unspecified: Secondary | ICD-10-CM
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
## 2015-05-02 ENCOUNTER — Telehealth: Payer: Self-pay | Admitting: Orthopaedic Surgery

## 2015-05-02 MED ORDER — HYDROCODONE-ACETAMINOPHEN 5-325 MG PO TABS: 1.0000 | ORAL_TABLET | ORAL | Status: DC | PRN

## 2015-05-02 NOTE — Telephone Encounter (Signed)
Rx done. 

## 2015-05-02 NOTE — Telephone Encounter (Signed)
Hydrocodone-Acetaminophen 5/325mg   Qty 120 Tablet

## 2015-05-08 ENCOUNTER — Other Ambulatory Visit: Payer: Self-pay | Admitting: Physician Assistant

## 2015-05-09 NOTE — Telephone Encounter (Signed)
Medication refilled per protocol. 

## 2015-05-10 ENCOUNTER — Telehealth: Payer: Self-pay | Admitting: Orthopaedic Surgery

## 2015-05-10 ENCOUNTER — Other Ambulatory Visit: Payer: Self-pay | Admitting: Physician Assistant

## 2015-05-10 ENCOUNTER — Other Ambulatory Visit: Payer: Self-pay | Admitting: Family Medicine

## 2015-05-10 NOTE — Telephone Encounter (Signed)
Medication refilled per protocol. 

## 2015-05-10 NOTE — Telephone Encounter (Signed)
Refill appropriate and filled per protocol. 

## 2015-05-13 ENCOUNTER — Other Ambulatory Visit: Payer: Self-pay | Admitting: Physician Assistant

## 2015-05-14 NOTE — Telephone Encounter (Signed)
Rx Done . 

## 2015-05-15 ENCOUNTER — Other Ambulatory Visit: Payer: Self-pay | Admitting: Family Medicine

## 2015-05-15 MED ORDER — ONETOUCH ULTRASOFT LANCETS MISC: Status: DC

## 2015-05-15 MED ORDER — GLUCOSE BLOOD VI STRP: ORAL_STRIP | Status: DC

## 2015-05-16 ENCOUNTER — Ambulatory Visit (INDEPENDENT_AMBULATORY_CARE_PROVIDER_SITE_OTHER): Payer: PPO | Admitting: Physician Assistant

## 2015-05-16 ENCOUNTER — Encounter: Payer: Self-pay | Admitting: Physician Assistant

## 2015-05-16 VITALS — BP 132/80 | HR 76 | Temp 97.6°F | Resp 18 | Wt 150.0 lb

## 2015-05-16 DIAGNOSIS — Z720 Tobacco use: Secondary | ICD-10-CM

## 2015-05-16 DIAGNOSIS — E1165 Type 2 diabetes mellitus with hyperglycemia: Secondary | ICD-10-CM

## 2015-05-16 DIAGNOSIS — F419 Anxiety disorder, unspecified: Secondary | ICD-10-CM

## 2015-05-16 DIAGNOSIS — D509 Iron deficiency anemia, unspecified: Secondary | ICD-10-CM | POA: Diagnosis not present

## 2015-05-16 DIAGNOSIS — I10 Essential (primary) hypertension: Secondary | ICD-10-CM

## 2015-05-16 DIAGNOSIS — I739 Peripheral vascular disease, unspecified: Secondary | ICD-10-CM

## 2015-05-16 DIAGNOSIS — E785 Hyperlipidemia, unspecified: Secondary | ICD-10-CM | POA: Diagnosis not present

## 2015-05-16 DIAGNOSIS — I779 Disorder of arteries and arterioles, unspecified: Secondary | ICD-10-CM

## 2015-05-16 DIAGNOSIS — F17200 Nicotine dependence, unspecified, uncomplicated: Secondary | ICD-10-CM

## 2015-05-16 LAB — COMPLETE METABOLIC PANEL WITH GFR
ALT: 18 U/L (ref 9–46)
AST: 18 U/L (ref 10–35)
Albumin: 4.2 g/dL (ref 3.6–5.1)
Alkaline Phosphatase: 55 U/L (ref 40–115)
BUN: 17 mg/dL (ref 7–25)
CHLORIDE: 102 mmol/L (ref 98–110)
CO2: 27 mmol/L (ref 20–31)
CREATININE: 0.86 mg/dL (ref 0.70–1.18)
Calcium: 9.7 mg/dL (ref 8.6–10.3)
GFR, Est Non African American: 85 mL/min (ref >=60)
GLUCOSE: 127 mg/dL — AB (ref 70–99)
Potassium: 4.5 mmol/L (ref 3.5–5.3)
SODIUM: 137 mmol/L (ref 135–146)
Total Bilirubin: 0.4 mg/dL (ref 0.2–1.2)
Total Protein: 6.6 g/dL (ref 6.1–8.1)

## 2015-05-16 LAB — LIPID PANEL
Cholesterol: 187 mg/dL (ref 125–200)
HDL: 45 mg/dL (ref >=40)
LDL CALC: 110 mg/dL (ref <130)
TRIGLYCERIDES: 161 mg/dL — AB (ref <150)
Total CHOL/HDL Ratio: 4.2 Ratio (ref <=5.0)
VLDL: 32 mg/dL — AB (ref <30)

## 2015-05-16 LAB — HEMOGLOBIN A1C
Hgb A1c MFr Bld: 8.2 % — ABNORMAL HIGH (ref <5.7)
Mean Plasma Glucose: 189 mg/dL
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
## 2015-06-04 ENCOUNTER — Encounter: Payer: Self-pay | Admitting: Orthopaedic Surgery

## 2015-06-04 ENCOUNTER — Ambulatory Visit (INDEPENDENT_AMBULATORY_CARE_PROVIDER_SITE_OTHER): Payer: PPO | Admitting: Orthopaedic Surgery

## 2015-06-04 VITALS — BP 167/67 | HR 97 | Temp 98.1°F | Ht 71.0 in | Wt 157.0 lb

## 2015-06-04 DIAGNOSIS — I10 Essential (primary) hypertension: Secondary | ICD-10-CM

## 2015-06-04 DIAGNOSIS — M5441 Lumbago with sciatica, right side: Secondary | ICD-10-CM

## 2015-06-04 MED ORDER — HYDROCODONE-ACETAMINOPHEN 5-325 MG PO TABS: 1.0000 | ORAL_TABLET | ORAL | Status: DC | PRN
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
## 2015-06-11 ENCOUNTER — Other Ambulatory Visit: Payer: Self-pay | Admitting: Family Medicine

## 2015-06-11 ENCOUNTER — Other Ambulatory Visit: Payer: Self-pay | Admitting: Physician Assistant

## 2015-06-11 NOTE — Telephone Encounter (Signed)
Medication refilled per protocol. 

## 2015-06-14 ENCOUNTER — Other Ambulatory Visit: Payer: Self-pay | Admitting: Family Medicine

## 2015-06-14 NOTE — Telephone Encounter (Signed)
Ok to refill??  Last office visit 05/16/2015.  Last refill 01/15/2015, #2 refills.

## 2015-06-14 NOTE — Telephone Encounter (Signed)
Per MD, ok to refill.   Medication called to pharmacy.

## 2015-06-15 NOTE — Telephone Encounter (Signed)
ok 

## 2015-06-18 ENCOUNTER — Emergency Department (HOSPITAL_COMMUNITY)
Admission: EM | Admit: 2015-06-18 | Discharge: 2015-06-18 | Disposition: A | Discharge status: Home / Self Care | Payer: PPO | Attending: Emergency Medicine | Admitting: Emergency Medicine

## 2015-06-18 ENCOUNTER — Encounter (HOSPITAL_COMMUNITY): Payer: Self-pay | Admitting: Emergency Medicine

## 2015-06-18 DIAGNOSIS — S90862A Insect bite (nonvenomous), left foot, initial encounter: Secondary | ICD-10-CM | POA: Insufficient documentation

## 2015-06-18 DIAGNOSIS — Y929 Unspecified place or not applicable: Secondary | ICD-10-CM | POA: Diagnosis not present

## 2015-06-18 DIAGNOSIS — W57XXXA Bitten or stung by nonvenomous insect and other nonvenomous arthropods, initial encounter: Secondary | ICD-10-CM | POA: Insufficient documentation

## 2015-06-18 DIAGNOSIS — E119 Type 2 diabetes mellitus without complications: Secondary | ICD-10-CM | POA: Diagnosis not present

## 2015-06-18 DIAGNOSIS — I10 Essential (primary) hypertension: Secondary | ICD-10-CM | POA: Insufficient documentation

## 2015-06-18 DIAGNOSIS — Z794 Long term (current) use of insulin: Secondary | ICD-10-CM | POA: Insufficient documentation

## 2015-06-18 DIAGNOSIS — S90465A Insect bite (nonvenomous), left lesser toe(s), initial encounter: Secondary | ICD-10-CM | POA: Diagnosis not present

## 2015-06-18 DIAGNOSIS — Z79899 Other long term (current) drug therapy: Secondary | ICD-10-CM | POA: Diagnosis not present

## 2015-06-18 DIAGNOSIS — Z7982 Long term (current) use of aspirin: Secondary | ICD-10-CM | POA: Insufficient documentation

## 2015-06-18 DIAGNOSIS — Y999 Unspecified external cause status: Secondary | ICD-10-CM | POA: Insufficient documentation

## 2015-06-18 DIAGNOSIS — Y939 Activity, unspecified: Secondary | ICD-10-CM | POA: Insufficient documentation

## 2015-06-18 DIAGNOSIS — F17210 Nicotine dependence, cigarettes, uncomplicated: Secondary | ICD-10-CM | POA: Insufficient documentation

## 2015-06-18 DIAGNOSIS — E785 Hyperlipidemia, unspecified: Secondary | ICD-10-CM | POA: Insufficient documentation

## 2015-06-18 DIAGNOSIS — Z7984 Long term (current) use of oral hypoglycemic drugs: Secondary | ICD-10-CM | POA: Insufficient documentation

## 2015-06-18 LAB — CBC WITH DIFFERENTIAL/PLATELET
BASOS ABS: 0.1 10*3/uL (ref 0.0–0.1)
Basophils Relative: 1 %
EOS PCT: 2 %
Eosinophils Absolute: 0.2 10*3/uL (ref 0.0–0.7)
HEMATOCRIT: 35.1 % — AB (ref 39.0–52.0)
HEMOGLOBIN: 11.8 g/dL — AB (ref 13.0–17.0)
LYMPHS PCT: 40 %
Lymphs Abs: 4.3 10*3/uL — ABNORMAL HIGH (ref 0.7–4.0)
MCH: 31.1 pg (ref 26.0–34.0)
MCHC: 33.6 g/dL (ref 30.0–36.0)
MCV: 92.6 fL (ref 78.0–100.0)
MONO ABS: 0.7 10*3/uL (ref 0.1–1.0)
MONOS PCT: 6 %
NEUTROS ABS: 5.5 10*3/uL (ref 1.7–7.7)
Neutrophils Relative %: 51 %
Platelets: 249 10*3/uL (ref 150–400)
RBC: 3.79 MIL/uL — ABNORMAL LOW (ref 4.22–5.81)
RDW: 13.7 % (ref 11.5–15.5)
WBC: 10.7 10*3/uL — ABNORMAL HIGH (ref 4.0–10.5)

## 2015-06-18 LAB — COMPREHENSIVE METABOLIC PANEL
ALK PHOS: 46 U/L (ref 38–126)
ALT: 19 U/L (ref 17–63)
AST: 20 U/L (ref 15–41)
Albumin: 4.2 g/dL (ref 3.5–5.0)
Anion gap: 8 (ref 5–15)
BUN: 23 mg/dL — ABNORMAL HIGH (ref 6–20)
CALCIUM: 9.6 mg/dL (ref 8.9–10.3)
CHLORIDE: 102 mmol/L (ref 101–111)
CO2: 25 mmol/L (ref 22–32)
CREATININE: 1.05 mg/dL (ref 0.61–1.24)
GFR calc non Af Amer: >60 mL/min (ref >60)
GLUCOSE: 193 mg/dL — AB (ref 65–99)
Potassium: 4.1 mmol/L (ref 3.5–5.1)
Sodium: 135 mmol/L (ref 135–145)
Total Bilirubin: 0.4 mg/dL (ref 0.3–1.2)
Total Protein: 7.1 g/dL (ref 6.5–8.1)

## 2015-06-18 MED ORDER — HYDROCODONE-ACETAMINOPHEN 5-325 MG PO TABS: 1.0000 | ORAL_TABLET | Freq: Four times a day (QID) | ORAL | Status: DC | PRN

## 2015-06-18 MED ORDER — DOXYCYCLINE HYCLATE 100 MG PO CAPS: 100.0000 mg | ORAL_CAPSULE | Freq: Two times a day (BID) | ORAL | Status: DC

## 2015-06-18 MED ORDER — DOXYCYCLINE HYCLATE 100 MG PO TABS
100.0000 mg | ORAL_TABLET | Freq: Once | ORAL | Status: AC
  Administered 2015-06-18: 100 mg via ORAL
  Filled 2015-06-18: qty 1
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
## 2015-06-18 NOTE — ED Notes (Signed)
PT c/o thought insect bite x4 days ago to left anterior foot with redness, swelling and pain.

## 2015-06-18 NOTE — Discharge Instructions (Signed)
Take antibiotic as directed. Take pain medicine as needed. Return for any new or worse symptoms. Would recommend soaking the left foot for 20 minutes each day in warm water.

## 2015-07-02 ENCOUNTER — Other Ambulatory Visit: Payer: Self-pay | Admitting: Orthopaedic Surgery

## 2015-07-02 ENCOUNTER — Telehealth: Payer: Self-pay | Admitting: *Deleted

## 2015-07-02 ENCOUNTER — Telehealth: Payer: Self-pay | Admitting: Orthopaedic Surgery

## 2015-07-02 MED ORDER — HYDROCODONE-ACETAMINOPHEN 5-325 MG PO TABS: 1.0000 | ORAL_TABLET | ORAL | Status: DC | PRN

## 2015-07-02 NOTE — Telephone Encounter (Signed)
Rx done. 

## 2015-07-02 NOTE — Telephone Encounter (Signed)
Hydrocodone-Acetaminophen  5/325mg  Qty 120 Tablets °

## 2015-07-02 NOTE — Telephone Encounter (Signed)
Patient request refill on Hydrocodone-Acetaminophen 5/325mg  Qty 120 Tablets Patient brought in documentation that he did not fill ER script. (will be available to see in Media)

## 2015-07-02 NOTE — Telephone Encounter (Signed)
Must wait a week.  Was given Rx for this same medicine in ER earlier in month.

## 2015-07-10 ENCOUNTER — Other Ambulatory Visit: Payer: Self-pay | Admitting: Family Medicine

## 2015-07-11 ENCOUNTER — Other Ambulatory Visit: Payer: Self-pay | Admitting: Physician Assistant

## 2015-07-11 NOTE — Telephone Encounter (Signed)
Refill appropriate and filled per protocol. 

## 2015-07-31 ENCOUNTER — Telehealth: Payer: Self-pay | Admitting: Orthopaedic Surgery

## 2015-07-31 MED ORDER — HYDROCODONE-ACETAMINOPHEN 5-325 MG PO TABS: 1.0000 | ORAL_TABLET | ORAL | Status: DC | PRN

## 2015-07-31 NOTE — Telephone Encounter (Signed)
Rx Done . 

## 2015-07-31 NOTE — Telephone Encounter (Signed)
Patient called for refill of: HYDROcodone-acetaminophen (NORCO/VICODIN) 5-325 MG tablet - quantity ?

## 2015-08-05 ENCOUNTER — Other Ambulatory Visit: Payer: Self-pay | Admitting: Physician Assistant

## 2015-08-05 NOTE — Telephone Encounter (Signed)
Medication refilled per protocol. 

## 2015-08-08 ENCOUNTER — Encounter: Payer: Self-pay | Admitting: Physician Assistant

## 2015-08-28 ENCOUNTER — Ambulatory Visit: Payer: PPO | Admitting: Physician Assistant

## 2015-08-29 ENCOUNTER — Telehealth: Payer: Self-pay | Admitting: Orthopaedic Surgery

## 2015-08-29 MED ORDER — HYDROCODONE-ACETAMINOPHEN 5-325 MG PO TABS: 1.0000 | ORAL_TABLET | ORAL | 0 refills | Status: DC | PRN

## 2015-08-29 NOTE — Telephone Encounter (Signed)
Hydrocodone-Acetaminophen  5/325mg  Quantity 120 Tablets

## 2015-09-04 ENCOUNTER — Ambulatory Visit (INDEPENDENT_AMBULATORY_CARE_PROVIDER_SITE_OTHER): Payer: PPO | Admitting: Physician Assistant

## 2015-09-04 ENCOUNTER — Ambulatory Visit (INDEPENDENT_AMBULATORY_CARE_PROVIDER_SITE_OTHER): Payer: PPO | Admitting: Orthopaedic Surgery

## 2015-09-04 ENCOUNTER — Encounter: Payer: Self-pay | Admitting: Physician Assistant

## 2015-09-04 ENCOUNTER — Encounter: Payer: Self-pay | Admitting: Orthopaedic Surgery

## 2015-09-04 VITALS — BP 110/74 | HR 64 | Temp 97.1°F | Resp 16 | Wt 144.0 lb

## 2015-09-04 VITALS — BP 108/57 | HR 78 | Temp 97.3°F | Ht 70.0 in | Wt 143.0 lb

## 2015-09-04 DIAGNOSIS — F419 Anxiety disorder, unspecified: Secondary | ICD-10-CM

## 2015-09-04 DIAGNOSIS — E1165 Type 2 diabetes mellitus with hyperglycemia: Secondary | ICD-10-CM

## 2015-09-04 DIAGNOSIS — F17200 Nicotine dependence, unspecified, uncomplicated: Secondary | ICD-10-CM

## 2015-09-04 DIAGNOSIS — Z860100 Personal history of colon polyps, unspecified: Secondary | ICD-10-CM

## 2015-09-04 DIAGNOSIS — I10 Essential (primary) hypertension: Secondary | ICD-10-CM | POA: Diagnosis not present

## 2015-09-04 DIAGNOSIS — M5441 Lumbago with sciatica, right side: Secondary | ICD-10-CM | POA: Diagnosis not present

## 2015-09-04 DIAGNOSIS — Z720 Tobacco use: Secondary | ICD-10-CM

## 2015-09-04 DIAGNOSIS — I779 Disorder of arteries and arterioles, unspecified: Secondary | ICD-10-CM | POA: Diagnosis not present

## 2015-09-04 DIAGNOSIS — I739 Peripheral vascular disease, unspecified: Secondary | ICD-10-CM

## 2015-09-04 DIAGNOSIS — E785 Hyperlipidemia, unspecified: Secondary | ICD-10-CM

## 2015-09-04 MED ORDER — HYDROCODONE-ACETAMINOPHEN 5-325 MG PO TABS: 1.0000 | ORAL_TABLET | ORAL | 0 refills | Status: DC | PRN
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
## 2015-09-05 LAB — HEMOGLOBIN A1C
Hgb A1c MFr Bld: 7.8 % — ABNORMAL HIGH (ref <5.7)
MEAN PLASMA GLUCOSE: 177 mg/dL

## 2015-09-12 ENCOUNTER — Other Ambulatory Visit: Payer: Self-pay | Admitting: Physician Assistant

## 2015-10-10 ENCOUNTER — Other Ambulatory Visit: Payer: Self-pay | Admitting: Family Medicine

## 2015-10-10 ENCOUNTER — Other Ambulatory Visit: Payer: Self-pay | Admitting: Physician Assistant

## 2015-10-14 NOTE — Progress Notes (Deleted)
Established Carotid Patient  History of Present Illness  [name] [name] is a 75 y.o. (June 27, 1940) male who presents with chief complaint: ***.  Previous carotid studies demonstrated: RICA 91-79% stenosis, LICA 15-05% stenosis.  Patient has no history of TIA or stroke symptom.  The patient has never had amaurosis fugax or monocular blindness.  The patient has never had facial drooping or hemiplegia.  The patient has never  had receptive or expressive aphasia.    The patient's PMH, PSH, SH, and FamHx are unchanged from 04/12/15.  Current Outpatient Prescriptions  Medication Sig Dispense Refill  . ALPRAZolam (XANAX) 0.5 MG tablet TAKE 1 TABLET BY MOUTH THREE TIMES DAILY AS NEEDED 90 tablet 2  . aspirin 81 MG tablet Take 81 mg by mouth daily.    . Blood Glucose Monitoring Suppl (ONETOUCH VERIO) w/Device KIT USE TO TEST BLOOD SUGAR LEVELS THREE TIMES DAILY 1 kit 0  . diclofenac (VOLTAREN) 75 MG EC tablet TAKE 1 TABLET(75 MG) BY MOUTH TWICE DAILY WITH A MEAL 180 tablet 2  . doxycycline (VIBRAMYCIN) 100 MG capsule Take 1 capsule (100 mg total) by mouth 2 (two) times daily. 14 capsule 0  . fluticasone (FLONASE) 50 MCG/ACT nasal spray Place 2 sprays into both nostrils daily. 16 g 6  . glipiZIDE (GLUCOTROL XL) 10 MG 24 hr tablet TAKE 1 TABLET BY MOUTH DAILY WITH BREAKFAST 90 tablet 0  . glucose blood (ONETOUCH VERIO) test strip Check BS bid - tid - E11.9 300 each 3  . HYDROcodone-acetaminophen (NORCO/VICODIN) 5-325 MG tablet Take 1 tablet by mouth every 4 (four) hours as needed for moderate pain (Must last 30 days.  Do not take and drive a car or use machinery.). 120 tablet 0  . Insulin Glargine (LANTUS SOLOSTAR) 100 UNIT/ML Solostar Pen Inject 15 Units into the skin daily at 10 pm. (Patient taking differently: Inject 12 Units into the skin every morning. ) 5 pen 11  . Insulin Pen Needle (PEN NEEDLES) 31G X 8 MM MISC 1 each by Does not apply route daily. 100 each 3  . Lancets (ONETOUCH ULTRASOFT)  lancets Checks bs bid - tid -E11.9 300 each 3  . losartan (COZAAR) 50 MG tablet TAKE 1 TABLET BY MOUTH EVERY NIGHT AT BEDTIME 90 tablet 0  . metFORMIN (GLUCOPHAGE) 1000 MG tablet TAKE 1 TABLET BY MOUTH TWICE DAILY WITH MEALS 180 tablet 0  . pioglitazone (ACTOS) 45 MG tablet TAKE 1 TABLET BY MOUTH EVERY NIGHT AT BEDTIME 90 tablet 0  . RaNITidine HCl (ZANTAC PO) Take by mouth as needed. OTC    . simvastatin (ZOCOR) 40 MG tablet TAKE 1 TABLET BY MOUTH DAILY AT 6 PM 90 tablet 1  . sitaGLIPtin (JANUVIA) 100 MG tablet Take 1 tablet (100 mg total) by mouth daily. 30 tablet 3   No current facility-administered medications for this visit.     Allergies  Allergen Reactions  . Invokana [Canagliflozin]     Rash, tachycardia     On ROS today: ***, ***   ***Physical Examination  There were no vitals filed for this visit.  There is no height or weight on file to calculate BMI.  General: {LOC:19197::"Somulent","Alert"}, {Orientation:19197::"Confused","O x 3"}, {Weight:19197::"Obese","Cachectic","WD"},{General state of health:19197::"Ill appearing","NAD"}  Eyes: {Eyes:19197::"Mental status changes prevent exam","PERRLA, EOMI"},{Lenses:19197::"[place] lenses"," "}  Neck: Supple, mid-line trachea, {LN:19197::"palpable lymph nodes"," "}  Pulmonary: {Chest wall:19197::"Asx chest movement","Sym exp"}, {Air movt:19197::"Decreased *** air movt","good B air movt"},{BS:19197::"rales on ***","rhonchi on ***","wheezing on ***","CTA B"}  Cardiac: {Rhythm:19197::"Irregularly, irregular rate and  rhythm","RRR, Nl S1, S2"}, {Murmur:19197::"Murmur present: ***","no Murmurs"}, {Rubs:19197::"Rub present: ***","No rubs"}, {Gallop:19197::"Gallop present: ***","No S3,S4"}  Vascular: Vessel Right Left  Radial {Palpable:19197::"Not palpable","Palpable"} {Palpable:19197::"Not palpable","Palpable"}  Brachial {Palpable:19197::"Not palpable","Palpable"} {Palpable:19197::"Not palpable","Palpable"}  Carotid Palpable,  {Bruit:19197::"Bruit present","No Bruit"} Palpable, {Bruit:19197::"Bruit present","No Bruit"}  Aorta Not palpable N/A  Femoral {Palpable:19197::"Not palpable","Palpable"} {Palpable:19197::"Not palpable","Palpable"}  Popliteal {Palpable:19197::"Prominently palpable","Not palpable"} {Palpable:19197::"Prominently palpable","Not palpable"}  PT {Palpable:19197::"Not palpable","Palpable"} {Palpable:19197::"Not palpable","Palpable"}  DP {Palpable:19197::"Not palpable","Palpable"} {Palpable:19197::"Not palpable","Palpable"}   Gastrointestinal: soft, {Distension:19197::"distended","non-distended"}, {TTP:19197::"TTP in *** quadrant","appropriate tenderness to palpation","non-tender to palpation"}, {Guarding:19197::"Guarding and rebound in *** quadrant","No guarding or rebound"}, {HSM:19197::"HSM present","no HSM"}, {Masses:19197::"Mass present: ***","no masses"}, {Flank:19197::"CVAT on ***","Flank bruit present on ***","no CVAT B"}, {AAA:19197::"Palpable prominent aortic pulse","No palpable prominent aortic pulse"}, {Surgical incision:19197::"Surg incisions: ***","Surgical incisions well healed"," "}  Musculoskeletal: M/S 5/5 throughout {MS:19197::"except ***"," "}, Extremities without ischemic changes {MS:19197::"except ***"," "}, {Edema:19197::"Edema in *** legs","No edema present"}, {Varicosities:19197::"Varicosities present: ***"," "}, {LDS:19197::"LDS present: *** leg","No LDS present"}  Neurologic: CN 2-12 intact{CN:19197::"except ***"," "}, Pain and light touch intact in extremities{CN:19197::"except ***"," "}, Motor exam as listed above   Non-Invasive Vascular Imaging  CAROTID DUPLEX (Date: ***):   R ICA stenosis: ***%  R VA: *** patent and antegrade  L ICA stenosis: ***%  L VA: *** patent and antegrade   Medical Decision Making  [name] [name] is a 75 y.o. male who presents with: ***sx ICA stenosis ***%.   Based on the patient's vascular studies and examination, I have offered the  patient: ***.  I discussed in depth with the patient the nature of atherosclerosis, and emphasized the importance of maximal medical management including strict control of blood pressure, blood glucose, and lipid levels, antiplatelet agents, obtaining regular exercise, and cessation of smoking.    The patient is aware that without maximal medical management the underlying atherosclerotic disease process will progress, limiting the benefit of any interventions. The patient is currently on a statin:  Zocor. The patient is currently on an anti-platelet: ASA.  Thank you for allowing [place] to participate in this patient's care.   Adele Barthel, MD, FACS [facility] Office: [phone] Pager: [phone]

## 2015-10-15 ENCOUNTER — Encounter: Payer: Self-pay | Admitting: Vascular Surgery

## 2015-10-18 ENCOUNTER — Encounter (HOSPITAL_COMMUNITY): Payer: PPO

## 2015-10-18 ENCOUNTER — Ambulatory Visit: Payer: PPO | Admitting: Vascular Surgery

## 2015-10-22 ENCOUNTER — Encounter: Payer: Self-pay | Admitting: Family Medicine

## 2015-10-22 ENCOUNTER — Telehealth: Payer: Self-pay | Admitting: Family Medicine

## 2015-10-22 MED ORDER — ALPRAZOLAM 0.5 MG PO TABS: 0.5000 mg | ORAL_TABLET | Freq: Three times a day (TID) | ORAL | 0 refills | Status: DC | PRN

## 2015-10-22 NOTE — Telephone Encounter (Signed)
Requesting refill on his xanax -Ok to refill??

## 2015-10-22 NOTE — Telephone Encounter (Signed)
Medication called/sent to requested pharmacy  - pt Needs office visit and labs before further refills and will send letter to make appt.

## 2015-10-22 NOTE — Telephone Encounter (Signed)
ntbs for labs and ov.  [place] with temp refill.

## 2015-10-29 ENCOUNTER — Telehealth: Payer: Self-pay | Admitting: Orthopaedic Surgery

## 2015-10-29 NOTE — Telephone Encounter (Signed)
Call received from patient for refill:  HYDROcodone-acetaminophen (NORCO/VICODIN) 5-325 MG tablet 120 tablet    - insurance: [employer], Medicare replacement plan

## 2015-10-30 MED ORDER — HYDROCODONE-ACETAMINOPHEN 5-325 MG PO TABS: 1.0000 | ORAL_TABLET | Freq: Four times a day (QID) | ORAL | 0 refills | Status: DC | PRN

## 2015-11-07 ENCOUNTER — Encounter: Payer: Self-pay | Admitting: Physician Assistant

## 2015-11-07 ENCOUNTER — Ambulatory Visit (INDEPENDENT_AMBULATORY_CARE_PROVIDER_SITE_OTHER): Payer: PPO | Admitting: Physician Assistant

## 2015-11-07 VITALS — BP 124/70 | HR 80 | Temp 98.3°F | Resp 16 | Ht 70.0 in | Wt 146.0 lb

## 2015-11-07 DIAGNOSIS — Z23 Encounter for immunization: Secondary | ICD-10-CM

## 2015-11-07 DIAGNOSIS — Z0000 Encounter for general adult medical examination without abnormal findings: Secondary | ICD-10-CM

## 2015-11-07 LAB — CBC WITH DIFFERENTIAL/PLATELET
Basophils Absolute: 0 cells/uL (ref 0–200)
Basophils Relative: 0 %
EOS ABS: 238 {cells}/uL (ref 15–500)
Eosinophils Relative: 2 %
HEMATOCRIT: 41.3 % (ref 38.5–50.0)
Hemoglobin: 13.7 g/dL (ref 13.0–17.0)
LYMPHS PCT: 30 %
Lymphs Abs: 3570 cells/uL (ref 850–3900)
MCH: 31.8 pg (ref 27.0–33.0)
MCHC: 33.2 g/dL (ref 32.0–36.0)
MCV: 95.8 fL (ref 80.0–100.0)
MONO ABS: 714 {cells}/uL (ref 200–950)
MPV: 10.1 fL (ref 7.5–12.5)
Monocytes Relative: 6 %
NEUTROS PCT: 62 %
Neutro Abs: 7378 cells/uL (ref 1500–7800)
Platelets: 233 10*3/uL (ref 140–400)
RBC: 4.31 MIL/uL (ref 4.20–5.80)
RDW: 13.8 % (ref 11.0–15.0)
WBC: 11.9 10*3/uL — AB (ref 3.8–10.8)

## 2015-11-07 LAB — LIPID PANEL
CHOL/HDL RATIO: 3.3 ratio (ref <=5.0)
CHOLESTEROL: 158 mg/dL (ref 125–200)
HDL: 48 mg/dL (ref >=40)
LDL Cholesterol: 93 mg/dL (ref <130)
TRIGLYCERIDES: 83 mg/dL (ref <150)
VLDL: 17 mg/dL (ref <30)

## 2015-11-07 LAB — COMPLETE METABOLIC PANEL WITH GFR
ALT: 15 U/L (ref 9–46)
AST: 16 U/L (ref 10–35)
Albumin: 4.1 g/dL (ref 3.6–5.1)
Alkaline Phosphatase: 49 U/L (ref 40–115)
BUN: 12 mg/dL (ref 7–25)
CALCIUM: 9.5 mg/dL (ref 8.6–10.3)
CHLORIDE: 102 mmol/L (ref 98–110)
CO2: 27 mmol/L (ref 20–31)
Creat: 0.9 mg/dL (ref 0.70–1.18)
GFR, EST NON AFRICAN AMERICAN: 84 mL/min (ref >=60)
Glucose, Bld: 199 mg/dL — ABNORMAL HIGH (ref 70–99)
POTASSIUM: 5 mmol/L (ref 3.5–5.3)
Sodium: 139 mmol/L (ref 135–146)
Total Bilirubin: 0.4 mg/dL (ref 0.2–1.2)
Total Protein: 6.8 g/dL (ref 6.1–8.1)

## 2015-11-07 LAB — TSH: TSH: 2.45 mIU/L (ref 0.40–4.50)

## 2015-11-07 LAB — PSA: PSA: 0.9 ng/mL (ref <=4.0)

## 2015-11-07 NOTE — Addendum Note (Signed)
Addended by: Vonna Kotyk A on: 11/07/2015 09:02 AM   Modules accepted: Orders
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
## 2015-11-12 DIAGNOSIS — L570 Actinic keratosis: Secondary | ICD-10-CM | POA: Diagnosis not present

## 2015-11-12 DIAGNOSIS — X32XXXD Exposure to sunlight, subsequent encounter: Secondary | ICD-10-CM | POA: Diagnosis not present

## 2015-11-14 ENCOUNTER — Encounter: Payer: Self-pay | Admitting: Vascular Surgery
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
## 2015-11-15 ENCOUNTER — Encounter: Payer: Self-pay | Admitting: Family

## 2015-11-15 ENCOUNTER — Ambulatory Visit (HOSPITAL_COMMUNITY)
Admission: RE | Admit: 2015-11-15 | Discharge: 2015-11-15 | Disposition: A | Discharge status: Home / Self Care | Payer: PPO | Source: Ambulatory Visit | Attending: Vascular Surgery | Admitting: Vascular Surgery

## 2015-11-15 ENCOUNTER — Ambulatory Visit (INDEPENDENT_AMBULATORY_CARE_PROVIDER_SITE_OTHER): Payer: PPO | Admitting: Family

## 2015-11-15 VITALS — BP 138/84 | HR 81 | Temp 97.6°F | Ht 70.0 in | Wt 145.2 lb

## 2015-11-15 DIAGNOSIS — I6523 Occlusion and stenosis of bilateral carotid arteries: Secondary | ICD-10-CM | POA: Insufficient documentation

## 2015-11-15 DIAGNOSIS — F17200 Nicotine dependence, unspecified, uncomplicated: Secondary | ICD-10-CM

## 2015-11-15 LAB — VAS US CAROTID
LCCAPSYS: 97 cm/s
LEFT ECA DIAS: -14 cm/s
Left CCA dist dias: 14 cm/s
Left CCA dist sys: 83 cm/s
Left CCA prox dias: 13 cm/s
Left ICA dist dias: -20 cm/s
Left ICA dist sys: -62 cm/s
Left ICA prox dias: -105 cm/s
Left ICA prox sys: -412 cm/s
RCCADSYS: -99 cm/s
RCCAPDIAS: 17 cm/s
RCCAPSYS: 126 cm/s
RIGHT CCA MID DIAS: 21 cm/s
RIGHT ECA DIAS: -14 cm/s

## 2015-11-15 NOTE — Patient Instructions (Addendum)
Stroke Prevention Some medical conditions and behaviors are associated with an increased chance of having a stroke. You may prevent a stroke by making healthy choices and managing medical conditions. HOW CAN I REDUCE MY RISK OF HAVING A STROKE?   Stay physically active. Get at least 30 minutes of activity on most or all days.  Do not smoke. It may also be helpful to avoid exposure to secondhand smoke.  Limit alcohol use. Moderate alcohol use is considered to be:  No more than 2 drinks per day for men.  No more than 1 drink per day for nonpregnant women.  Eat healthy foods. This involves:  Eating 5 or more servings of fruits and vegetables a day.  Making dietary changes that address high blood pressure (hypertension), high cholesterol, diabetes, or obesity.  Manage your cholesterol levels.  Making food choices that are high in fiber and low in saturated fat, trans fat, and cholesterol may control cholesterol levels.  Take any prescribed medicines to control cholesterol as directed by your health care provider.  Manage your diabetes.  Controlling your carbohydrate and sugar intake is recommended to manage diabetes.  Take any prescribed medicines to control diabetes as directed by your health care provider.  Control your hypertension.  Making food choices that are low in salt (sodium), saturated fat, trans fat, and cholesterol is recommended to manage hypertension.  Ask your health care provider if you need treatment to lower your blood pressure. Take any prescribed medicines to control hypertension as directed by your health care provider.  If you are 18-39 years of age, have your blood pressure checked every 3-5 years. If you are 40 years of age or older, have your blood pressure checked every year.  Maintain a healthy weight.  Reducing calorie intake and making food choices that are low in sodium, saturated fat, trans fat, and cholesterol are recommended to manage  weight.  Stop drug abuse.  Avoid taking birth control pills.  Talk to your health care provider about the risks of taking birth control pills if you are over 35 years old, smoke, get migraines, or have ever had a blood clot.  Get evaluated for sleep disorders (sleep apnea).  Talk to your health care provider about getting a sleep evaluation if you snore a lot or have excessive sleepiness.  Take medicines only as directed by your health care provider.  For some people, aspirin or blood thinners (anticoagulants) are helpful in reducing the risk of forming abnormal blood clots that can lead to stroke. If you have the irregular heart rhythm of atrial fibrillation, you should be on a blood thinner unless there is a good reason you cannot take them.  Understand all your medicine instructions.  Make sure that other conditions (such as anemia or atherosclerosis) are addressed. SEEK IMMEDIATE MEDICAL CARE IF:   You have sudden weakness or numbness of the face, arm, or leg, especially on one side of the body.  Your face or eyelid droops to one side.  You have sudden confusion.  You have trouble speaking (aphasia) or understanding.  You have sudden trouble seeing in one or both eyes.  You have sudden trouble walking.  You have dizziness.  You have a loss of balance or coordination.  You have a sudden, severe headache with no known cause.  You have new chest pain or an irregular heartbeat. Any of these symptoms may represent a serious problem that is an emergency. Do not wait to see if the symptoms will   go away. Get medical help at once. Call your local emergency services (911 in [place] Do not drive yourself to the hospital.   This information is not intended to replace advice given to you by your health care provider. Make sure you discuss any questions you have with your health care provider.   Document Released: 02/06/2004 Document Revised: 01/19/2014 Document Reviewed:  07/01/2012 Elsevier Interactive Patient Education 2016 Elsevier Inc.     Steps to Quit Smoking  Smoking tobacco can be harmful to your health and can affect almost every organ in your body. Smoking puts you, and those around you, at risk for developing many serious chronic diseases. Quitting smoking is difficult, but it is one of the best things that you can do for your health. It is never too late to quit. WHAT ARE THE BENEFITS OF QUITTING SMOKING? When you quit smoking, you lower your risk of developing serious diseases and conditions, such as:  Lung cancer or lung disease, such as COPD.  Heart disease.  Stroke.  Heart attack.  Infertility.  Osteoporosis and bone fractures. Additionally, symptoms such as coughing, wheezing, and shortness of breath may get better when you quit. You may also find that you get sick less often because your body is stronger at fighting off colds and infections. If you are pregnant, quitting smoking can help to reduce your chances of having a baby of low birth weight. HOW DO I GET READY TO QUIT? When you decide to quit smoking, create a plan to make sure that you are successful. Before you quit:  Pick a date to quit. Set a date within the next two weeks to give you time to prepare.  Write down the reasons why you are quitting. Keep this list in places where you will see it often, such as on your bathroom mirror or in your car or wallet.  Identify the people, places, things, and activities that make you want to smoke (triggers) and avoid them. Make sure to take these actions:  Throw away all cigarettes at home, at work, and in your car.  Throw away smoking accessories, such as ashtrays and lighters.  Clean your car and make sure to empty the ashtray.  Clean your home, including curtains and carpets.  Tell your family, friends, and coworkers that you are quitting. Support from your loved ones can make quitting easier.  Talk with your health care  provider about your options for quitting smoking.  Find out what treatment options are covered by your health insurance. WHAT STRATEGIES CAN I USE TO QUIT SMOKING?  Talk with your healthcare provider about different strategies to quit smoking. Some strategies include:  Quitting smoking altogether instead of gradually lessening how much you smoke over a period of time. Research shows that quitting "cold turkey" is more successful than gradually quitting.  Attending in-person counseling to help you build problem-solving skills. You are more likely to have success in quitting if you attend several counseling sessions. Even short sessions of 10 minutes can be effective.  Finding resources and support systems that can help you to quit smoking and remain smoke-free after you quit. These resources are most helpful when you use them often. They can include:  Online chats with a counselor.  Telephone quitlines.  Printed self-help materials.  Support groups or group counseling.  Text messaging programs.  Mobile phone applications.  Taking medicines to help you quit smoking. (If you are pregnant or breastfeeding, talk with your health care provider first.) Some   medicines contain nicotine and some do not. Both types of medicines help with cravings, but the medicines that include nicotine help to relieve withdrawal symptoms. Your health care provider may recommend:  Nicotine patches, gum, or lozenges.  Nicotine inhalers or sprays.  Non-nicotine medicine that is taken by mouth. Talk with your health care provider about combining strategies, such as taking medicines while you are also receiving in-person counseling. Using these two strategies together makes you more likely to succeed in quitting than if you used either strategy on its own. If you are pregnant or breastfeeding, talk with your health care provider about finding counseling or other support strategies to quit smoking. Do not take  medicine to help you quit smoking unless told to do so by your health care provider. WHAT THINGS CAN I DO TO MAKE IT EASIER TO QUIT? Quitting smoking might feel overwhelming at first, but there is a lot that you can do to make it easier. Take these important actions:  Reach out to your family and friends and ask that they support and encourage you during this time. Call telephone quitlines, reach out to support groups, or work with a counselor for support.  Ask people who smoke to avoid smoking around you.  Avoid places that trigger you to smoke, such as bars, parties, or smoke-break areas at work.  Spend time around people who do not smoke.  Lessen stress in your life, because stress can be a smoking trigger for some people. To lessen stress, try:  Exercising regularly.  Deep-breathing exercises.  Yoga.  Meditating.  Performing a body scan. This involves closing your eyes, scanning your body from head to toe, and noticing which parts of your body are particularly tense. Purposefully relax the muscles in those areas.  Download or purchase mobile phone or tablet apps (applications) that can help you stick to your quit plan by providing reminders, tips, and encouragement. There are many free apps, such as QuitGuide from the CDC (Centers for Disease Control and Prevention). You can find other support for quitting smoking (smoking cessation) through smokefree.gov and other websites. HOW WILL I FEEL WHEN I QUIT SMOKING? Within the first 24 hours of quitting smoking, you may start to feel some withdrawal symptoms. These symptoms are usually most noticeable 2-3 days after quitting, but they usually do not last beyond 2-3 weeks. Changes or symptoms that you might experience include:  Mood swings.  Restlessness, anxiety, or irritation.  Difficulty concentrating.  Dizziness.  Strong cravings for sugary foods in addition to nicotine.  Mild weight  gain.  Constipation.  Nausea.  Coughing or a sore throat.  Changes in how your medicines work in your body.  A depressed mood.  Difficulty sleeping (insomnia). After the first 2-3 weeks of quitting, you may start to notice more positive results, such as:  Improved sense of smell and taste.  Decreased coughing and sore throat.  Slower heart rate.  Lower blood pressure.  Clearer skin.  The ability to breathe more easily.  Fewer sick days. Quitting smoking is very challenging for most people. Do not get discouraged if you are not successful the first time. Some people need to make many attempts to quit before they achieve long-term success. Do your best to stick to your quit plan, and talk with your health care provider if you have any questions or concerns.   This information is not intended to replace advice given to you by your health care provider. Make sure you discuss any questions   you have with your health care provider.   Document Released: 12/23/2000 Document Revised: 05/15/2014 Document Reviewed: 05/15/2014 Elsevier Interactive Patient Education 2016 Elsevier Inc.  

## 2015-11-19 ENCOUNTER — Other Ambulatory Visit: Payer: Self-pay | Admitting: Family Medicine

## 2015-11-19 DIAGNOSIS — R0982 Postnasal drip: Secondary | ICD-10-CM

## 2015-11-24 ENCOUNTER — Other Ambulatory Visit: Payer: Self-pay | Admitting: Physician Assistant

## 2015-11-25 NOTE — Telephone Encounter (Signed)
RX filled per protocol 

## 2015-12-03 NOTE — Addendum Note (Signed)
Addended by: Lianne Cure A on: 12/03/2015 09:40 AM   Modules accepted: Orders

## 2015-12-04 ENCOUNTER — Ambulatory Visit (INDEPENDENT_AMBULATORY_CARE_PROVIDER_SITE_OTHER): Payer: PPO | Admitting: Orthopaedic Surgery

## 2015-12-04 ENCOUNTER — Encounter: Payer: Self-pay | Admitting: Orthopaedic Surgery

## 2015-12-04 VITALS — BP 113/61 | HR 94 | Temp 97.3°F | Ht 69.0 in | Wt 145.0 lb

## 2015-12-04 DIAGNOSIS — I10 Essential (primary) hypertension: Secondary | ICD-10-CM

## 2015-12-04 DIAGNOSIS — M5441 Lumbago with sciatica, right side: Secondary | ICD-10-CM

## 2015-12-04 DIAGNOSIS — F17200 Nicotine dependence, unspecified, uncomplicated: Secondary | ICD-10-CM

## 2015-12-04 DIAGNOSIS — G8929 Other chronic pain: Secondary | ICD-10-CM | POA: Diagnosis not present

## 2015-12-04 MED ORDER — HYDROCODONE-ACETAMINOPHEN 5-325 MG PO TABS: 1.0000 | ORAL_TABLET | Freq: Four times a day (QID) | ORAL | 0 refills | Status: DC | PRN
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
## 2015-12-04 NOTE — Patient Instructions (Signed)
Steps to Quit Smoking Smoking tobacco can be bad for your health. It can also affect almost every organ in your body. Smoking puts you and people around you at risk for many serious long-lasting (chronic) diseases. Quitting smoking is hard, but it is one of the best things that you can do for your health. It is never too late to quit. What are the benefits of quitting smoking? When you quit smoking, you lower your risk for getting serious diseases and conditions. They can include:  Lung cancer or lung disease.  Heart disease.  Stroke.  Heart attack.  Not being able to have children (infertility).  Weak bones (osteoporosis) and broken bones (fractures). If you have coughing, wheezing, and shortness of breath, those symptoms may get better when you quit. You may also get sick less often. If you are pregnant, quitting smoking can help to lower your chances of having a baby of low birth weight. What can I do to help me quit smoking? Talk with your doctor about what can help you quit smoking. Some things you can do (strategies) include:  Quitting smoking totally, instead of slowly cutting back how much you smoke over a period of time.  Going to in-person counseling. You are more likely to quit if you go to many counseling sessions.  Using resources and support systems, such as:  Online chats with a counselor.  Phone quitlines.  Printed self-help materials.  Support groups or group counseling.  Text messaging programs.  Mobile phone apps or applications.  Taking medicines. Some of these medicines may have nicotine in them. If you are pregnant or breastfeeding, do not take any medicines to quit smoking unless your doctor says it is okay. Talk with your doctor about counseling or other things that can help you. Talk with your doctor about using more than one strategy at the same time, such as taking medicines while you are also going to in-person counseling. This can help make quitting  easier. What things can I do to make it easier to quit? Quitting smoking might feel very hard at first, but there is a lot that you can do to make it easier. Take these steps:  Talk to your family and friends. Ask them to support and encourage you.  Call phone quitlines, reach out to support groups, or work with a counselor.  Ask people who smoke to not smoke around you.  Avoid places that make you want (trigger) to smoke, such as:  Bars.  Parties.  Smoke-break areas at work.  Spend time with people who do not smoke.  Lower the stress in your life. Stress can make you want to smoke. Try these things to help your stress:  Getting regular exercise.  Deep-breathing exercises.  Yoga.  Meditating.  Doing a body scan. To do this, close your eyes, focus on one area of your body at a time from head to toe, and notice which parts of your body are tense. Try to relax the muscles in those areas.  Download or buy apps on your mobile phone or tablet that can help you stick to your quit plan. There are many free apps, such as QuitGuide from the CDC (Centers for Disease Control and Prevention). You can find more support from smokefree.gov and other websites. This information is not intended to replace advice given to you by your health care provider. Make sure you discuss any questions you have with your health care provider. Document Released: 10/25/2008 Document Revised: 08/27/2015 Document   Reviewed: 05/15/2014 Elsevier Interactive Patient Education  2017 Elsevier Inc.  

## 2015-12-11 ENCOUNTER — Telehealth: Payer: Self-pay | Admitting: Family Medicine

## 2015-12-11 NOTE — Telephone Encounter (Signed)
Requesting refill on Xanax - Ok to refill??       

## 2015-12-12 MED ORDER — ALPRAZOLAM 0.5 MG PO TABS: 0.5000 mg | ORAL_TABLET | Freq: Three times a day (TID) | ORAL | 0 refills | Status: DC | PRN

## 2015-12-12 NOTE — Telephone Encounter (Signed)
Approved. #[id number]. 

## 2015-12-12 NOTE — Telephone Encounter (Signed)
Rx called in 

## 2015-12-13 ENCOUNTER — Other Ambulatory Visit: Payer: Self-pay | Admitting: Physician Assistant

## 2015-12-31 ENCOUNTER — Telehealth: Payer: Self-pay | Admitting: Orthopaedic Surgery

## 2015-12-31 MED ORDER — HYDROCODONE-ACETAMINOPHEN 5-325 MG PO TABS: 1.0000 | ORAL_TABLET | Freq: Four times a day (QID) | ORAL | 0 refills | Status: DC | PRN

## 2015-12-31 NOTE — Telephone Encounter (Signed)
Patient requests refill:  HYDROcodone-acetaminophen (NORCO/VICODIN) 5-325 MG tablet 110 tablet

## 2016-01-07 ENCOUNTER — Other Ambulatory Visit: Payer: Self-pay | Admitting: Physician Assistant

## 2016-01-08 NOTE — Telephone Encounter (Signed)
rx filled per protocol  

## 2016-01-16 ENCOUNTER — Other Ambulatory Visit: Payer: Self-pay | Admitting: Physician Assistant

## 2016-01-16 NOTE — Telephone Encounter (Signed)
rx called in

## 2016-01-16 NOTE — Telephone Encounter (Signed)
Ok to refill 

## 2016-01-16 NOTE — Telephone Encounter (Signed)
Approved. # 90 + 2. 

## 2016-01-24 ENCOUNTER — Other Ambulatory Visit: Payer: Self-pay | Admitting: Physician Assistant

## 2016-01-31 ENCOUNTER — Telehealth: Payer: Self-pay | Admitting: Orthopaedic Surgery

## 2016-01-31 MED ORDER — HYDROCODONE-ACETAMINOPHEN 5-325 MG PO TABS
1.0000 | ORAL_TABLET | Freq: Four times a day (QID) | ORAL | 0 refills | Status: DC | PRN
Start: 2016-01-31 — End: 2016-03-04

## 2016-01-31 NOTE — Telephone Encounter (Signed)
Patient requests refill on Hydrocodone/Acetaminophen  5-3.25  Mgs.   Qty  110  Sig: Take 1 tablet by mouth every 6 (six) hours as needed for moderate pain (Must last 30 days.Do not take and drive a car or use machinery.).

## 2016-03-04 ENCOUNTER — Ambulatory Visit (INDEPENDENT_AMBULATORY_CARE_PROVIDER_SITE_OTHER): Payer: PPO | Admitting: Orthopaedic Surgery

## 2016-03-04 ENCOUNTER — Encounter: Payer: Self-pay | Admitting: Orthopaedic Surgery

## 2016-03-04 VITALS — BP 119/70 | HR 85 | Temp 98.1°F | Ht 69.0 in | Wt 142.0 lb

## 2016-03-04 DIAGNOSIS — F17210 Nicotine dependence, cigarettes, uncomplicated: Secondary | ICD-10-CM | POA: Diagnosis not present

## 2016-03-04 DIAGNOSIS — M79641 Pain in right hand: Secondary | ICD-10-CM

## 2016-03-04 DIAGNOSIS — G8929 Other chronic pain: Secondary | ICD-10-CM | POA: Diagnosis not present

## 2016-03-04 DIAGNOSIS — M5441 Lumbago with sciatica, right side: Secondary | ICD-10-CM

## 2016-03-04 MED ORDER — HYDROCODONE-ACETAMINOPHEN 5-325 MG PO TABS: 1.0000 | ORAL_TABLET | Freq: Four times a day (QID) | ORAL | 0 refills | Status: DC | PRN
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
## 2016-03-09 ENCOUNTER — Encounter (HOSPITAL_COMMUNITY): Payer: Self-pay | Admitting: Emergency Medicine

## 2016-03-09 ENCOUNTER — Emergency Department (HOSPITAL_COMMUNITY)
Admission: EM | Admit: 2016-03-09 | Discharge: 2016-03-09 | Disposition: A | Discharge status: Home / Self Care | Payer: PPO | Attending: Emergency Medicine | Admitting: Emergency Medicine

## 2016-03-09 DIAGNOSIS — F17210 Nicotine dependence, cigarettes, uncomplicated: Secondary | ICD-10-CM | POA: Diagnosis not present

## 2016-03-09 DIAGNOSIS — Y9389 Activity, other specified: Secondary | ICD-10-CM | POA: Diagnosis not present

## 2016-03-09 DIAGNOSIS — Y929 Unspecified place or not applicable: Secondary | ICD-10-CM | POA: Diagnosis not present

## 2016-03-09 DIAGNOSIS — Y990 Civilian activity done for income or pay: Secondary | ICD-10-CM | POA: Insufficient documentation

## 2016-03-09 DIAGNOSIS — Z23 Encounter for immunization: Secondary | ICD-10-CM | POA: Insufficient documentation

## 2016-03-09 DIAGNOSIS — Z79899 Other long term (current) drug therapy: Secondary | ICD-10-CM | POA: Insufficient documentation

## 2016-03-09 DIAGNOSIS — W268XXA Contact with other sharp object(s), not elsewhere classified, initial encounter: Secondary | ICD-10-CM | POA: Diagnosis not present

## 2016-03-09 DIAGNOSIS — I10 Essential (primary) hypertension: Secondary | ICD-10-CM | POA: Insufficient documentation

## 2016-03-09 DIAGNOSIS — Z794 Long term (current) use of insulin: Secondary | ICD-10-CM | POA: Insufficient documentation

## 2016-03-09 DIAGNOSIS — Z7982 Long term (current) use of aspirin: Secondary | ICD-10-CM | POA: Insufficient documentation

## 2016-03-09 DIAGNOSIS — E119 Type 2 diabetes mellitus without complications: Secondary | ICD-10-CM | POA: Diagnosis not present

## 2016-03-09 DIAGNOSIS — S61011A Laceration without foreign body of right thumb without damage to nail, initial encounter: Secondary | ICD-10-CM | POA: Diagnosis not present

## 2016-03-09 MED ORDER — TETANUS-DIPHTH-ACELL PERTUSSIS 5-2.5-18.5 LF-MCG/0.5 IM SUSP
0.5000 mL | Freq: Once | INTRAMUSCULAR | Status: AC
  Administered 2016-03-09: 0.5 mL via INTRAMUSCULAR
  Filled 2016-03-09: qty 0.5

## 2016-03-09 NOTE — ED Triage Notes (Signed)
PT states he cut his right hand near his thumb x2 days and wife applied a dressing and 2 butterfly strips. Bleeding controlled and scabbing noted with no redness or swelling.
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
## 2016-03-09 NOTE — Discharge Instructions (Signed)
Continue to keep the wound clean and dry, and monitor for signs of infection. Your tetanus status was updated today.

## 2016-03-09 NOTE — ED Provider Notes (Signed)
  Face-to-face evaluation   History: Patient injured his right hand bumping into a sharp blade of a saw which was not running several days ago.  He is here at the insistence of his wife to get a tetanus booster.  Physical exam: Alert, cooperative.  Wound right hyperthenar eminence, Steri-Stripped, well approximated and healing nicely.  There is no sign of drainage bleeding or infection.  Normal range of motion right thumb.   Medical screening examination/treatment/procedure(s) were conducted as a shared visit with non-physician practitioner(s) and myself.  I personally evaluated the patient during the encounter   Daleen Bo, MD 03/09/16 2333

## 2016-03-29 ENCOUNTER — Other Ambulatory Visit: Payer: Self-pay | Admitting: Physician Assistant

## 2016-03-30 NOTE — Telephone Encounter (Signed)
Refill appropriate 

## 2016-04-01 ENCOUNTER — Telehealth: Payer: Self-pay | Admitting: Orthopaedic Surgery

## 2016-04-01 MED ORDER — HYDROCODONE-ACETAMINOPHEN 5-325 MG PO TABS: 1.0000 | ORAL_TABLET | Freq: Four times a day (QID) | ORAL | 0 refills | Status: DC | PRN

## 2016-04-01 NOTE — Telephone Encounter (Signed)
Hydrocodone-Acetaminophen  5/325 mg  Qty 100 tablets

## 2016-04-28 ENCOUNTER — Other Ambulatory Visit: Payer: Self-pay | Admitting: Physician Assistant

## 2016-04-28 NOTE — Telephone Encounter (Signed)
RX refilled patient is due for an office visit.letter mailed for patient to call and schedule an appointment

## 2016-04-29 ENCOUNTER — Telehealth: Payer: Self-pay | Admitting: Orthopaedic Surgery

## 2016-04-29 NOTE — Telephone Encounter (Signed)
Patient called for refill:  HYDROcodone-acetaminophen (NORCO/VICODIN) 5-325 MG tablet 95 tablet

## 2016-04-30 MED ORDER — HYDROCODONE-ACETAMINOPHEN 5-325 MG PO TABS: 1.0000 | ORAL_TABLET | Freq: Four times a day (QID) | ORAL | 0 refills | Status: DC | PRN

## 2016-05-06 ENCOUNTER — Encounter: Payer: Self-pay | Admitting: Family

## 2016-05-11 ENCOUNTER — Other Ambulatory Visit: Payer: Self-pay | Admitting: Physician Assistant

## 2016-05-11 ENCOUNTER — Ambulatory Visit (INDEPENDENT_AMBULATORY_CARE_PROVIDER_SITE_OTHER): Payer: PPO | Admitting: Physician Assistant

## 2016-05-11 ENCOUNTER — Ambulatory Visit: Payer: PPO | Admitting: Physician Assistant

## 2016-05-11 ENCOUNTER — Encounter: Payer: Self-pay | Admitting: Physician Assistant

## 2016-05-11 VITALS — BP 120/62 | HR 81 | Temp 98.2°F | Resp 16 | Wt 144.2 lb

## 2016-05-11 DIAGNOSIS — I10 Essential (primary) hypertension: Secondary | ICD-10-CM

## 2016-05-11 DIAGNOSIS — E119 Type 2 diabetes mellitus without complications: Secondary | ICD-10-CM | POA: Diagnosis not present

## 2016-05-11 DIAGNOSIS — I779 Disorder of arteries and arterioles, unspecified: Secondary | ICD-10-CM | POA: Diagnosis not present

## 2016-05-11 DIAGNOSIS — F419 Anxiety disorder, unspecified: Secondary | ICD-10-CM | POA: Diagnosis not present

## 2016-05-11 DIAGNOSIS — I739 Peripheral vascular disease, unspecified: Secondary | ICD-10-CM

## 2016-05-11 DIAGNOSIS — E785 Hyperlipidemia, unspecified: Secondary | ICD-10-CM

## 2016-05-11 LAB — LIPID PANEL
Cholesterol: 185 mg/dL (ref <200)
HDL: 45 mg/dL (ref >40)
LDL CALC: 115 mg/dL — AB (ref <100)
Total CHOL/HDL Ratio: 4.1 Ratio (ref <5.0)
Triglycerides: 125 mg/dL (ref <150)
VLDL: 25 mg/dL (ref <30)

## 2016-05-11 LAB — COMPREHENSIVE METABOLIC PANEL
AG RATIO: 1.7 ratio (ref 1.0–2.5)
ALK PHOS: 54 U/L (ref 40–115)
ALT: 15 U/L (ref 9–46)
AST: 13 U/L (ref 10–35)
Albumin: 4 g/dL (ref 3.6–5.1)
BILIRUBIN TOTAL: 0.4 mg/dL (ref 0.2–1.2)
BUN/Creatinine Ratio: 19.7 Ratio (ref 6–22)
BUN: 15 mg/dL (ref 7–25)
CO2: 24 mmol/L (ref 20–31)
Calcium: 9.2 mg/dL (ref 8.6–10.3)
Chloride: 102 mmol/L (ref 98–110)
Creat: 0.76 mg/dL (ref 0.70–1.18)
GFR, EST NON AFRICAN AMERICAN: 89 mL/min (ref >=60)
GLOBULIN: 2.4 g/dL (ref 1.9–3.7)
GLUCOSE: 213 mg/dL — AB (ref 70–99)
Potassium: 4.3 mmol/L (ref 3.5–5.3)
Sodium: 136 mmol/L (ref 135–146)
Total Protein: 6.4 g/dL (ref 6.1–8.1)
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
## 2016-05-12 LAB — HEMOGLOBIN A1C
HEMOGLOBIN A1C: 9.7 % — AB (ref <5.7)
MEAN PLASMA GLUCOSE: 232 mg/dL

## 2016-05-12 LAB — MICROALBUMIN, URINE: Microalb, Ur: 0.6 mg/dL

## 2016-05-13 ENCOUNTER — Other Ambulatory Visit: Payer: Self-pay

## 2016-05-13 MED ORDER — INSULIN GLARGINE 100 UNIT/ML SOLOSTAR PEN: PEN_INJECTOR | SUBCUTANEOUS | 11 refills | Status: DC

## 2016-05-13 NOTE — Progress Notes (Signed)
Patient had stopped his lantus  insulin due to cost per patient. I spoke with the patient and asked if he needed assistance with the cost of his medication. Patient states he was been noncompliant and would like to start his insulin back up. Discussed with PCP she is fine with sending in a new RX

## 2016-05-15 ENCOUNTER — Encounter: Payer: Self-pay | Admitting: Family

## 2016-05-15 ENCOUNTER — Ambulatory Visit (HOSPITAL_COMMUNITY)
Admission: RE | Admit: 2016-05-15 | Discharge: 2016-05-15 | Disposition: A | Discharge status: Home / Self Care | Payer: PPO | Source: Ambulatory Visit | Attending: Family | Admitting: Family

## 2016-05-15 ENCOUNTER — Ambulatory Visit (INDEPENDENT_AMBULATORY_CARE_PROVIDER_SITE_OTHER): Payer: PPO | Admitting: Family

## 2016-05-15 VITALS — BP 128/74 | HR 89 | Temp 97.7°F | Resp 18 | Ht 69.5 in | Wt 140.0 lb

## 2016-05-15 DIAGNOSIS — F17200 Nicotine dependence, unspecified, uncomplicated: Secondary | ICD-10-CM | POA: Diagnosis not present

## 2016-05-15 DIAGNOSIS — I6523 Occlusion and stenosis of bilateral carotid arteries: Secondary | ICD-10-CM | POA: Diagnosis not present

## 2016-05-15 LAB — VAS US CAROTID
LCCADDIAS: 18 cm/s
LCCAPSYS: 98 cm/s
LEFT ECA DIAS: -12 cm/s
Left CCA dist sys: 82 cm/s
Left CCA prox dias: 18 cm/s
Left ICA dist dias: -33 cm/s
Left ICA dist sys: -71 cm/s
Left ICA prox dias: -123 cm/s
Left ICA prox sys: -460 cm/s
RCCADSYS: -116 cm/s
RCCAPDIAS: 23 cm/s
RCCAPSYS: 104 cm/s
RIGHT CCA MID DIAS: 22 cm/s
RIGHT ECA DIAS: -10 cm/s

## 2016-05-15 NOTE — Patient Instructions (Signed)
Stroke Prevention Some medical conditions and behaviors are associated with an increased chance of having a stroke. You may prevent a stroke by making healthy choices and managing medical conditions. How can I reduce my risk of having a stroke?  Stay physically active. Get at least 30 minutes of activity on most or all days.  Do not smoke. It may also be helpful to avoid exposure to secondhand smoke.  Limit alcohol use. Moderate alcohol use is considered to be:  No more than 2 drinks per day for men.  No more than 1 drink per day for nonpregnant women.  Eat healthy foods. This involves:  Eating 5 or more servings of fruits and vegetables a day.  Making dietary changes that address high blood pressure (hypertension), high cholesterol, diabetes, or obesity.  Manage your cholesterol levels.  Making food choices that are high in fiber and low in saturated fat, trans fat, and cholesterol may control cholesterol levels.  Take any prescribed medicines to control cholesterol as directed by your health care provider.  Manage your diabetes.  Controlling your carbohydrate and sugar intake is recommended to manage diabetes.  Take any prescribed medicines to control diabetes as directed by your health care provider.  Control your hypertension.  Making food choices that are low in salt (sodium), saturated fat, trans fat, and cholesterol is recommended to manage hypertension.  Ask your health care provider if you need treatment to lower your blood pressure. Take any prescribed medicines to control hypertension as directed by your health care provider.  If you are 18-39 years of age, have your blood pressure checked every 3-5 years. If you are 40 years of age or older, have your blood pressure checked every year.  Maintain a healthy weight.  Reducing calorie intake and making food choices that are low in sodium, saturated fat, trans fat, and cholesterol are recommended to manage  weight.  Stop drug abuse.  Avoid taking birth control pills.  Talk to your health care provider about the risks of taking birth control pills if you are over 35 years old, smoke, get migraines, or have ever had a blood clot.  Get evaluated for sleep disorders (sleep apnea).  Talk to your health care provider about getting a sleep evaluation if you snore a lot or have excessive sleepiness.  Take medicines only as directed by your health care provider.  For some people, aspirin or blood thinners (anticoagulants) are helpful in reducing the risk of forming abnormal blood clots that can lead to stroke. If you have the irregular heart rhythm of atrial fibrillation, you should be on a blood thinner unless there is a good reason you cannot take them.  Understand all your medicine instructions.  Make sure that other conditions (such as anemia or atherosclerosis) are addressed. Get help right away if:  You have sudden weakness or numbness of the face, arm, or leg, especially on one side of the body.  Your face or eyelid droops to one side.  You have sudden confusion.  You have trouble speaking (aphasia) or understanding.  You have sudden trouble seeing in one or both eyes.  You have sudden trouble walking.  You have dizziness.  You have a loss of balance or coordination.  You have a sudden, severe headache with no known cause.  You have new chest pain or an irregular heartbeat. Any of these symptoms may represent a serious problem that is an emergency. Do not wait to see if the symptoms will go away.   Get medical help at once. Call your local emergency services (911 in [place] Do not drive yourself to the hospital. This information is not intended to replace advice given to you by your health care provider. Make sure you discuss any questions you have with your health care provider. Document Released: 02/06/2004 Document Revised: 06/06/2015 Document Reviewed: 07/01/2012 Elsevier  Interactive Patient Education  2017 Elsevier Inc.  
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
## 2016-05-22 ENCOUNTER — Encounter: Payer: Self-pay | Admitting: Vascular Surgery

## 2016-05-22 ENCOUNTER — Encounter: Payer: Self-pay | Admitting: *Deleted

## 2016-05-22 ENCOUNTER — Ambulatory Visit (INDEPENDENT_AMBULATORY_CARE_PROVIDER_SITE_OTHER): Payer: PPO | Admitting: Vascular Surgery

## 2016-05-22 VITALS — BP 125/69 | HR 78 | Temp 97.7°F | Resp 16 | Ht 69.5 in | Wt 145.0 lb

## 2016-05-22 DIAGNOSIS — I779 Disorder of arteries and arterioles, unspecified: Secondary | ICD-10-CM

## 2016-05-22 DIAGNOSIS — I739 Peripheral vascular disease, unspecified: Principal | ICD-10-CM
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
## 2016-05-23 ENCOUNTER — Other Ambulatory Visit: Payer: Self-pay | Admitting: Physician Assistant

## 2016-05-25 NOTE — Telephone Encounter (Signed)
Last OV 4/30 Last refill  1/4 Ok to refill?

## 2016-05-25 NOTE — Telephone Encounter (Signed)
Rx called in to pharmacy. 

## 2016-05-25 NOTE — Telephone Encounter (Signed)
Approved. # 90 + 2. 

## 2016-06-02 ENCOUNTER — Encounter: Payer: Self-pay | Admitting: Orthopaedic Surgery

## 2016-06-02 ENCOUNTER — Ambulatory Visit (INDEPENDENT_AMBULATORY_CARE_PROVIDER_SITE_OTHER): Payer: PPO | Admitting: Orthopaedic Surgery

## 2016-06-02 VITALS — BP 130/79 | HR 81 | Temp 97.3°F | Ht 69.5 in | Wt 147.0 lb

## 2016-06-02 DIAGNOSIS — G8929 Other chronic pain: Secondary | ICD-10-CM | POA: Diagnosis not present

## 2016-06-02 DIAGNOSIS — F17210 Nicotine dependence, cigarettes, uncomplicated: Secondary | ICD-10-CM | POA: Diagnosis not present

## 2016-06-02 DIAGNOSIS — M5441 Lumbago with sciatica, right side: Secondary | ICD-10-CM | POA: Diagnosis not present

## 2016-06-02 MED ORDER — HYDROCODONE-ACETAMINOPHEN 5-325 MG PO TABS: 1.0000 | ORAL_TABLET | Freq: Four times a day (QID) | ORAL | 0 refills | Status: DC | PRN
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
## 2016-06-02 NOTE — Patient Instructions (Signed)
Steps to Quit Smoking Smoking tobacco can be bad for your health. It can also affect almost every organ in your body. Smoking puts you and people around you at risk for many serious Dameon Soltis-lasting (chronic) diseases. Quitting smoking is hard, but it is one of the best things that you can do for your health. It is never too late to quit. What are the benefits of quitting smoking? When you quit smoking, you lower your risk for getting serious diseases and conditions. They can include:  Lung cancer or lung disease.  Heart disease.  Stroke.  Heart attack.  Not being able to have children (infertility).  Weak bones (osteoporosis) and broken bones (fractures). If you have coughing, wheezing, and shortness of breath, those symptoms may get better when you quit. You may also get sick less often. If you are pregnant, quitting smoking can help to lower your chances of having a baby of low birth weight. What can I do to help me quit smoking? Talk with your doctor about what can help you quit smoking. Some things you can do (strategies) include:  Quitting smoking totally, instead of slowly cutting back how much you smoke over a period of time.  Going to in-person counseling. You are more likely to quit if you go to many counseling sessions.  Using resources and support systems, such as:  Online chats with a counselor.  Phone quitlines.  Printed self-help materials.  Support groups or group counseling.  Text messaging programs.  Mobile phone apps or applications.  Taking medicines. Some of these medicines may have nicotine in them. If you are pregnant or breastfeeding, do not take any medicines to quit smoking unless your doctor says it is okay. Talk with your doctor about counseling or other things that can help you. Talk with your doctor about using more than one strategy at the same time, such as taking medicines while you are also going to in-person counseling. This can help make quitting  easier. What things can I do to make it easier to quit? Quitting smoking might feel very hard at first, but there is a lot that you can do to make it easier. Take these steps:  Talk to your family and friends. Ask them to support and encourage you.  Call phone quitlines, reach out to support groups, or work with a counselor.  Ask people who smoke to not smoke around you.  Avoid places that make you want (trigger) to smoke, such as:  Bars.  Parties.  Smoke-break areas at work.  Spend time with people who do not smoke.  Lower the stress in your life. Stress can make you want to smoke. Try these things to help your stress:  Getting regular exercise.  Deep-breathing exercises.  Yoga.  Meditating.  Doing a body scan. To do this, close your eyes, focus on one area of your body at a time from head to toe, and notice which parts of your body are tense. Try to relax the muscles in those areas.  Download or buy apps on your mobile phone or tablet that can help you stick to your quit plan. There are many free apps, such as QuitGuide from the CDC (Centers for Disease Control and Prevention). You can find more support from smokefree.gov and other websites. This information is not intended to replace advice given to you by your health care provider. Make sure you discuss any questions you have with your health care provider. Document Released: 10/25/2008 Document Revised: 08/27/2015 Document   Reviewed: 05/15/2014 Elsevier Interactive Patient Education  2017 Elsevier Inc.  

## 2016-06-03 ENCOUNTER — Ambulatory Visit (INDEPENDENT_AMBULATORY_CARE_PROVIDER_SITE_OTHER): Payer: PPO | Admitting: Cardiology

## 2016-06-03 ENCOUNTER — Encounter: Payer: Self-pay | Admitting: Cardiology

## 2016-06-03 VITALS — BP 149/68 | HR 88 | Ht 70.0 in | Wt 142.0 lb

## 2016-06-03 DIAGNOSIS — I6523 Occlusion and stenosis of bilateral carotid arteries: Secondary | ICD-10-CM | POA: Diagnosis not present

## 2016-06-03 DIAGNOSIS — Z01810 Encounter for preprocedural cardiovascular examination: Secondary | ICD-10-CM
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
## 2016-06-03 NOTE — Patient Instructions (Signed)
Medication Instructions:  Continue all current medications.  Labwork: none  Testing/Procedures: none  Follow-Up: As needed.    Any Other Special Instructions Will Be Listed Below (If Applicable).  If you need a refill on your cardiac medications before your next appointment, please call your pharmacy.  

## 2016-06-05 ENCOUNTER — Other Ambulatory Visit: Payer: Self-pay | Admitting: *Deleted

## 2016-06-08 ENCOUNTER — Other Ambulatory Visit: Payer: Self-pay | Admitting: Family Medicine

## 2016-06-12 ENCOUNTER — Encounter (HOSPITAL_COMMUNITY): Payer: Self-pay

## 2016-06-12 ENCOUNTER — Encounter (HOSPITAL_COMMUNITY)
Admission: RE | Admit: 2016-06-12 | Discharge: 2016-06-12 | Disposition: A | Discharge status: Home / Self Care | Payer: PPO | Source: Ambulatory Visit | Attending: Vascular Surgery | Admitting: Vascular Surgery

## 2016-06-12 DIAGNOSIS — Z7951 Long term (current) use of inhaled steroids: Secondary | ICD-10-CM | POA: Diagnosis not present

## 2016-06-12 DIAGNOSIS — Z833 Family history of diabetes mellitus: Secondary | ICD-10-CM | POA: Diagnosis not present

## 2016-06-12 DIAGNOSIS — Z809 Family history of malignant neoplasm, unspecified: Secondary | ICD-10-CM | POA: Diagnosis not present

## 2016-06-12 DIAGNOSIS — Z7982 Long term (current) use of aspirin: Secondary | ICD-10-CM | POA: Diagnosis not present

## 2016-06-12 DIAGNOSIS — I6522 Occlusion and stenosis of left carotid artery: Secondary | ICD-10-CM | POA: Diagnosis not present

## 2016-06-12 DIAGNOSIS — E785 Hyperlipidemia, unspecified: Secondary | ICD-10-CM | POA: Diagnosis not present

## 2016-06-12 DIAGNOSIS — Z794 Long term (current) use of insulin: Secondary | ICD-10-CM | POA: Diagnosis not present

## 2016-06-12 DIAGNOSIS — Z8041 Family history of malignant neoplasm of ovary: Secondary | ICD-10-CM | POA: Diagnosis not present

## 2016-06-12 DIAGNOSIS — E119 Type 2 diabetes mellitus without complications: Secondary | ICD-10-CM | POA: Diagnosis not present

## 2016-06-12 DIAGNOSIS — K219 Gastro-esophageal reflux disease without esophagitis: Secondary | ICD-10-CM | POA: Diagnosis not present

## 2016-06-12 DIAGNOSIS — L720 Epidermal cyst: Secondary | ICD-10-CM | POA: Diagnosis not present

## 2016-06-12 DIAGNOSIS — F419 Anxiety disorder, unspecified: Secondary | ICD-10-CM | POA: Diagnosis not present

## 2016-06-12 DIAGNOSIS — Z86010 Personal history of colonic polyps: Secondary | ICD-10-CM | POA: Diagnosis not present

## 2016-06-12 DIAGNOSIS — Z888 Allergy status to other drugs, medicaments and biological substances status: Secondary | ICD-10-CM | POA: Diagnosis not present

## 2016-06-12 DIAGNOSIS — I10 Essential (primary) hypertension: Secondary | ICD-10-CM | POA: Diagnosis not present

## 2016-06-12 DIAGNOSIS — F17210 Nicotine dependence, cigarettes, uncomplicated: Secondary | ICD-10-CM | POA: Diagnosis not present

## 2016-06-12 HISTORY — DX: Gastro-esophageal reflux disease without esophagitis: K21.9

## 2016-06-12 HISTORY — DX: Unspecified osteoarthritis, unspecified site: M19.90

## 2016-06-12 LAB — PROTIME-INR
INR: 0.95
Prothrombin Time: 12.6 seconds (ref 11.4–15.2)

## 2016-06-12 LAB — URINALYSIS, ROUTINE W REFLEX MICROSCOPIC
Bacteria, UA: NONE SEEN
Bilirubin Urine: NEGATIVE
HGB URINE DIPSTICK: NEGATIVE
Ketones, ur: NEGATIVE mg/dL
Leukocytes, UA: NEGATIVE
Nitrite: NEGATIVE
PH: 6 (ref 5.0–8.0)
Protein, ur: NEGATIVE mg/dL
SPECIFIC GRAVITY, URINE: 1.003 — AB (ref 1.005–1.030)

## 2016-06-12 LAB — COMPREHENSIVE METABOLIC PANEL
ALBUMIN: 3.7 g/dL (ref 3.5–5.0)
ALK PHOS: 45 U/L (ref 38–126)
ALT: 15 U/L — ABNORMAL LOW (ref 17–63)
ANION GAP: 7 (ref 5–15)
AST: 17 U/L (ref 15–41)
BUN: 10 mg/dL (ref 6–20)
CALCIUM: 9.6 mg/dL (ref 8.9–10.3)
CHLORIDE: 102 mmol/L (ref 101–111)
CO2: 27 mmol/L (ref 22–32)
Creatinine, Ser: 0.78 mg/dL (ref 0.61–1.24)
GFR calc Af Amer: >60 mL/min (ref >60)
GFR calc non Af Amer: >60 mL/min (ref >60)
GLUCOSE: 195 mg/dL — AB (ref 65–99)
Potassium: 4.4 mmol/L (ref 3.5–5.1)
SODIUM: 136 mmol/L (ref 135–145)
Total Bilirubin: 0.4 mg/dL (ref 0.3–1.2)
Total Protein: 6.2 g/dL — ABNORMAL LOW (ref 6.5–8.1)

## 2016-06-12 LAB — TYPE AND SCREEN
ABO/RH(D): A POS
ANTIBODY SCREEN: NEGATIVE

## 2016-06-12 LAB — CBC
HCT: 38 % — ABNORMAL LOW (ref 39.0–52.0)
HEMOGLOBIN: 12.5 g/dL — AB (ref 13.0–17.0)
MCH: 30.7 pg (ref 26.0–34.0)
MCHC: 32.9 g/dL (ref 30.0–36.0)
MCV: 93.4 fL (ref 78.0–100.0)
PLATELETS: 249 10*3/uL (ref 150–400)
RBC: 4.07 MIL/uL — AB (ref 4.22–5.81)
RDW: 13.7 % (ref 11.5–15.5)
WBC: 13 10*3/uL — ABNORMAL HIGH (ref 4.0–10.5)

## 2016-06-12 LAB — GLUCOSE, CAPILLARY: Glucose-Capillary: 132 mg/dL — ABNORMAL HIGH (ref 65–99)

## 2016-06-12 LAB — SURGICAL PCR SCREEN
MRSA, PCR: NEGATIVE
Staphylococcus aureus: POSITIVE — AB

## 2016-06-12 LAB — ABO/RH: ABO/RH(D): A POS

## 2016-06-12 LAB — APTT: APTT: 29 s (ref 24–36)

## 2016-06-12 NOTE — Pre-Procedure Instructions (Addendum)
[name] [name]  06/12/2016      [facility] [place], [place] - [place] [address] [place] [address] Phone: [phone] Fax: [phone]  [facility] [place], [place] - [name] AT [place]. [name] [name] [place] [address] Phone: [phone] Fax: [phone]    Your procedure is scheduled on 06/15/16.  Report to [facility] Admitting at 530 A.M.  Call this number if you have problems the morning of surgery:  [phone]   Remember:  Do not eat food or drink liquids after midnight.  Take these medicines the morning of surgery with A SIP OF WATER     Xanax if needed,pain if needed,zantac  STOP all herbel meds, nsaids (aleve,naproxen,advil,ibuprofen)   prior to surgery starting TODAY including all vitamins/supplements  NO metformin,actos,glipizide day of surgery  Aspirin per dr   How to Manage Your Diabetes Before and After Surgery  Why is it important to control my blood sugar before and after surgery? . Improving blood sugar levels before and after surgery helps healing and can limit problems. . A way of improving blood sugar control is eating a healthy diet by: o  Eating less sugar and carbohydrates o  Increasing activity/exercise o  Talking with your doctor about reaching your blood sugar goals . High blood sugars (greater than 180 mg/dL) can raise your risk of infections and slow your recovery, so you will need to focus on controlling your diabetes during the weeks before surgery. . Make sure that the doctor who takes care of your diabetes knows about your planned surgery including the date and location.  How do I manage my blood sugar before surgery? . Check your blood sugar at least 4 times a day, starting 2 days before surgery, to make sure that the level is not too high or low. o Check your blood sugar the morning of your surgery when you wake up and  every 2 hours until you get to the Short Stay unit. . If your blood sugar is less than 70 mg/dL, you will need to treat for low blood sugar: o Do not take insulin. o Treat a low blood sugar (less than 70 mg/dL) with  cup of clear juice (cranberry or apple), 4 glucose tablets, OR glucose gel. o Recheck blood sugar in 15 minutes after treatment (to make sure it is greater than 70 mg/dL). If your blood sugar is not greater than 70 mg/dL on recheck, call [phone] for further instructions. . Report your blood sugar to the short stay nurse when you get to Short Stay.  . If you are admitted to the hospital after surgery: o Your blood sugar will be checked by the staff and you will probably be given insulin after surgery (instead of oral diabetes medicines) to make sure you have good blood sugar levels. o The goal for blood sugar control after surgery is 80-180 mg/dL.  WHAT DO I DO ABOUT MY DIABETES MEDICATION?   Marland Kitchen Do not take oral diabetes medicines (pills) the morning of surgery.(no metformin, actos, glipizide)    . THE MORNING OF SURGERY, take 5units of basglar insulin.if needed blood sugar   Do not wear jewelry, make-up or nail polish.  Do not wear lotions, powders, or perfumes, or deoderant.  Do not shave 48 hours prior to surgery.  Men may shave face and neck.  Do not bring valuables to  the hospital.  [facility] is not responsible for any belongings or valuables.  Contacts, dentures or bridgework may not be worn into surgery.  Leave your suitcase in the car.  After surgery it may be brought to your room.  For patients admitted to the hospital, discharge time will be determined by your treatment team.  Patients discharged the day of surgery will not be allowed to drive home.   Special instructions:   Special Instructions: [facility] - Preparing for Surgery  Before surgery, you can play an important role.  Because skin is not sterile, your skin needs to be as free of germs as  possible.  You can reduce the number of germs on you skin by washing with CHG (chlorahexidine gluconate) soap before surgery.  CHG is an antiseptic cleaner which kills germs and bonds with the skin to continue killing germs even after washing.  Please DO NOT use if you have an allergy to CHG or antibacterial soaps.  If your skin becomes reddened/irritated stop using the CHG and inform your nurse when you arrive at Short Stay.  Do not shave (including legs and underarms) for at least 48 hours prior to the first CHG shower.  You may shave your face.  Please follow these instructions carefully:   1.  Shower with CHG Soap the night before surgery and the morning of Surgery.  2.  If you choose to wash your hair, wash your hair first as usual with your normal shampoo.  3.  After you shampoo, rinse your hair and body thoroughly to remove the Shampoo.  4.  Use CHG as you would any other liquid soap.  You can apply chg directly  to the skin and wash gently with scrungie or a clean washcloth.  5.  Apply the CHG Soap to your body ONLY FROM THE NECK DOWN.  Do not use on open wounds or open sores.  Avoid contact with your eyes ears, mouth and genitals (private parts).  Wash genitals (private parts)       with your normal soap.  6.  Wash thoroughly, paying special attention to the area where your surgery will be performed.  7.  Thoroughly rinse your body with warm water from the neck down.  8.  DO NOT shower/wash with your normal soap after using and rinsing off the CHG Soap.  9.  Pat yourself dry with a clean towel.            10.  Wear clean pajamas.            11.  Place clean sheets on your bed the night of your first shower and do not sleep with pets.  Day of Surgery  Do not apply any lotions/deodorants the morning of surgery.  Please wear clean clothes to the hospital/surgery center.  Please read over the  fact sheets that you were given.

## 2016-06-13 LAB — HEMOGLOBIN A1C
HEMOGLOBIN A1C: 9.5 % — AB (ref 4.8–5.6)
MEAN PLASMA GLUCOSE: 226 mg/dL

## 2016-06-14 NOTE — Anesthesia Preprocedure Evaluation (Addendum)
Anesthesia Evaluation  Patient identified by MRN, date of birth, ID band Patient awake    Reviewed: Allergy & Precautions, NPO status , Patient's Chart, lab work & pertinent test results  Airway Mallampati: III  TM Distance: >3 FB Neck ROM: Full    Dental no notable dental hx. (+) Missing   Pulmonary Current Smoker (1/2 ppd),    Pulmonary exam normal breath sounds clear to auscultation       Cardiovascular hypertension, Pt. on medications Normal cardiovascular exam Rhythm:Regular Rate:Normal  ECG: SR, short PR, rate 82 1. Preoperative evaluation - tolerates greater than 4METs regularly without limitation or symptoms - recommend proceeding with surgery as planned.  Arnoldo Lenis, M.D.   Neuro/Psych Anxiety negative neurological ROS     GI/Hepatic Neg liver ROS, GERD (pt denies)  Medicated and Controlled,  Endo/Other  diabetes, Well Controlled, Oral Hypoglycemic Agents  Renal/GU negative Renal ROS  negative genitourinary   Musculoskeletal negative musculoskeletal ROS (+)   Abdominal   Peds negative pediatric ROS (+)  Hematology  (+) anemia ,   Anesthesia Other Findings Hyperlipidemia  Reproductive/Obstetrics negative OB ROS                           Anesthesia Physical Anesthesia Plan  ASA: III  Anesthesia Plan: General   Post-op Pain Management:    Induction: Intravenous  Airway Management Planned:   Additional Equipment: Arterial line  Intra-op Plan:   Post-operative Plan: Extubation in OR  Informed Consent: I have reviewed the patients History and Physical, chart, labs and discussed the procedure including the risks, benefits and alternatives for the proposed anesthesia with the patient or authorized representative who has indicated his/her understanding and acceptance.   Dental advisory given  Plan Discussed with: CRNA  Anesthesia Plan Comments:          Anesthesia Quick Evaluation

## 2016-06-15 ENCOUNTER — Inpatient Hospital Stay (HOSPITAL_COMMUNITY)
Admission: RE | Admit: 2016-06-15 | Discharge: 2016-06-16 | DRG: 039 | Disposition: A | Discharge status: Home / Self Care | Payer: PPO | Source: Ambulatory Visit | Attending: Vascular Surgery | Admitting: Vascular Surgery

## 2016-06-15 ENCOUNTER — Encounter (HOSPITAL_COMMUNITY): Payer: Self-pay

## 2016-06-15 ENCOUNTER — Encounter (HOSPITAL_COMMUNITY)
Admission: RE | Disposition: A | Discharge status: Home / Self Care | Payer: Self-pay | Source: Ambulatory Visit | Attending: Vascular Surgery

## 2016-06-15 ENCOUNTER — Inpatient Hospital Stay (HOSPITAL_COMMUNITY): Payer: PPO | Admitting: Certified Registered Nurse Anesthetist

## 2016-06-15 DIAGNOSIS — Z86010 Personal history of colonic polyps: Secondary | ICD-10-CM

## 2016-06-15 DIAGNOSIS — I10 Essential (primary) hypertension: Secondary | ICD-10-CM | POA: Diagnosis present

## 2016-06-15 DIAGNOSIS — Z888 Allergy status to other drugs, medicaments and biological substances status: Secondary | ICD-10-CM | POA: Diagnosis not present

## 2016-06-15 DIAGNOSIS — K219 Gastro-esophageal reflux disease without esophagitis: Secondary | ICD-10-CM | POA: Diagnosis present

## 2016-06-15 DIAGNOSIS — F419 Anxiety disorder, unspecified: Secondary | ICD-10-CM | POA: Diagnosis not present

## 2016-06-15 DIAGNOSIS — Z794 Long term (current) use of insulin: Secondary | ICD-10-CM

## 2016-06-15 DIAGNOSIS — Z7951 Long term (current) use of inhaled steroids: Secondary | ICD-10-CM | POA: Diagnosis not present

## 2016-06-15 DIAGNOSIS — Z7982 Long term (current) use of aspirin: Secondary | ICD-10-CM | POA: Diagnosis not present

## 2016-06-15 DIAGNOSIS — I6522 Occlusion and stenosis of left carotid artery: Secondary | ICD-10-CM | POA: Diagnosis not present

## 2016-06-15 DIAGNOSIS — F17210 Nicotine dependence, cigarettes, uncomplicated: Secondary | ICD-10-CM | POA: Diagnosis not present

## 2016-06-15 DIAGNOSIS — Z809 Family history of malignant neoplasm, unspecified: Secondary | ICD-10-CM | POA: Diagnosis not present

## 2016-06-15 DIAGNOSIS — L720 Epidermal cyst: Secondary | ICD-10-CM | POA: Diagnosis not present

## 2016-06-15 DIAGNOSIS — Z8041 Family history of malignant neoplasm of ovary: Secondary | ICD-10-CM

## 2016-06-15 DIAGNOSIS — Z833 Family history of diabetes mellitus: Secondary | ICD-10-CM

## 2016-06-15 DIAGNOSIS — E785 Hyperlipidemia, unspecified: Secondary | ICD-10-CM | POA: Diagnosis present

## 2016-06-15 DIAGNOSIS — I6529 Occlusion and stenosis of unspecified carotid artery: Secondary | ICD-10-CM | POA: Diagnosis present

## 2016-06-15 DIAGNOSIS — D509 Iron deficiency anemia, unspecified: Secondary | ICD-10-CM | POA: Diagnosis not present

## 2016-06-15 DIAGNOSIS — E119 Type 2 diabetes mellitus without complications: Secondary | ICD-10-CM | POA: Diagnosis present

## 2016-06-15 HISTORY — DX: Unspecified malignant neoplasm of skin of unspecified part of face: C44.300

## 2016-06-15 HISTORY — DX: Occlusion and stenosis of left carotid artery: I65.22

## 2016-06-15 HISTORY — DX: Type 2 diabetes mellitus without complications: E11.9

## 2016-06-15 HISTORY — PX: PATCH ANGIOPLASTY: SHX6230

## 2016-06-15 HISTORY — PX: ENDARTERECTOMY: SHX5162

## 2016-06-15 HISTORY — PX: CAROTID ENDARTERECTOMY: SUR193

## 2016-06-15 LAB — CBC
HEMATOCRIT: 36.1 % — AB (ref 39.0–52.0)
HEMOGLOBIN: 11.8 g/dL — AB (ref 13.0–17.0)
MCH: 30.6 pg (ref 26.0–34.0)
MCHC: 32.7 g/dL (ref 30.0–36.0)
MCV: 93.5 fL (ref 78.0–100.0)
PLATELETS: 194 10*3/uL (ref 150–400)
RBC: 3.86 MIL/uL — AB (ref 4.22–5.81)
RDW: 13.8 % (ref 11.5–15.5)
WBC: 15.1 10*3/uL — ABNORMAL HIGH (ref 4.0–10.5)

## 2016-06-15 LAB — GLUCOSE, CAPILLARY
GLUCOSE-CAPILLARY: 151 mg/dL — AB (ref 65–99)
GLUCOSE-CAPILLARY: 163 mg/dL — AB (ref 65–99)
Glucose-Capillary: 279 mg/dL — ABNORMAL HIGH (ref 65–99)
Glucose-Capillary: 307 mg/dL — ABNORMAL HIGH (ref 65–99)

## 2016-06-15 LAB — CREATININE, SERUM: CREATININE: 0.9 mg/dL (ref 0.61–1.24)

## 2016-06-15 SURGERY — ENDARTERECTOMY, CAROTID
Anesthesia: General | Site: Neck | Laterality: Left

## 2016-06-15 MED ORDER — PROPOFOL 10 MG/ML IV BOLUS
INTRAVENOUS | Status: DC | PRN
  Administered 2016-06-15: 50 mg via INTRAVENOUS
  Administered 2016-06-15: 100 mg via INTRAVENOUS
  Administered 2016-06-15: 50 mg via INTRAVENOUS

## 2016-06-15 MED ORDER — GLIPIZIDE ER 10 MG PO TB24
10.0000 mg | ORAL_TABLET | Freq: Every day | ORAL | Status: DC
  Filled 2016-06-15: qty 1

## 2016-06-15 MED ORDER — ONDANSETRON HCL 4 MG/2ML IJ SOLN: 4.0000 mg | Freq: Four times a day (QID) | INTRAMUSCULAR | Status: DC | PRN

## 2016-06-15 MED ORDER — PANTOPRAZOLE SODIUM 40 MG PO TBEC
40.0000 mg | DELAYED_RELEASE_TABLET | Freq: Every day | ORAL | Status: DC
  Administered 2016-06-15: 40 mg via ORAL
  Filled 2016-06-15: qty 1

## 2016-06-15 MED ORDER — ASPIRIN 81 MG PO CHEW
81.0000 mg | CHEWABLE_TABLET | Freq: Every day | ORAL | Status: DC
  Filled 2016-06-15: qty 1

## 2016-06-15 MED ORDER — SODIUM CHLORIDE 0.9 % IV SOLN
INTRAVENOUS | Status: DC
  Administered 2016-06-15: 17:00:00 via INTRAVENOUS

## 2016-06-15 MED ORDER — ROCURONIUM BROMIDE 100 MG/10ML IV SOLN
INTRAVENOUS | Status: DC | PRN
  Administered 2016-06-15: 50 mg via INTRAVENOUS

## 2016-06-15 MED ORDER — MIDAZOLAM HCL 2 MG/2ML IJ SOLN
INTRAMUSCULAR | Status: AC
  Filled 2016-06-15: qty 2

## 2016-06-15 MED ORDER — PROTAMINE SULFATE 10 MG/ML IV SOLN
INTRAVENOUS | Status: AC
  Filled 2016-06-15: qty 5

## 2016-06-15 MED ORDER — SODIUM CHLORIDE 0.9 % IV SOLN: 500.0000 mL | Freq: Once | INTRAVENOUS | Status: DC | PRN

## 2016-06-15 MED ORDER — DEXAMETHASONE SODIUM PHOSPHATE 10 MG/ML IJ SOLN
INTRAMUSCULAR | Status: DC | PRN
  Administered 2016-06-15: 5 mg via INTRAVENOUS

## 2016-06-15 MED ORDER — ALUM & MAG HYDROXIDE-SIMETH 200-200-20 MG/5ML PO SUSP: 15.0000 mL | ORAL | Status: DC | PRN

## 2016-06-15 MED ORDER — PROTAMINE SULFATE 10 MG/ML IV SOLN
INTRAVENOUS | Status: DC | PRN
  Administered 2016-06-15: 10 mg via INTRAVENOUS
  Administered 2016-06-15: 30 mg via INTRAVENOUS

## 2016-06-15 MED ORDER — FENTANYL CITRATE (PF) 250 MCG/5ML IJ SOLN
INTRAMUSCULAR | Status: AC
  Filled 2016-06-15: qty 5

## 2016-06-15 MED ORDER — PHENYLEPHRINE HCL 10 MG/ML IJ SOLN
INTRAMUSCULAR | Status: DC | PRN
  Administered 2016-06-15: 60 ug/min via INTRAVENOUS

## 2016-06-15 MED ORDER — HEPARIN SODIUM (PORCINE) 1000 UNIT/ML IJ SOLN
INTRAMUSCULAR | Status: DC | PRN
  Administered 2016-06-15: 7000 [IU] via INTRAVENOUS
  Administered 2016-06-15: 2000 [IU] via INTRAVENOUS

## 2016-06-15 MED ORDER — FENTANYL CITRATE (PF) 100 MCG/2ML IJ SOLN
INTRAMUSCULAR | Status: DC | PRN
  Administered 2016-06-15: 100 ug via INTRAVENOUS
  Administered 2016-06-15: 50 ug via INTRAVENOUS
  Administered 2016-06-15: 100 ug via INTRAVENOUS

## 2016-06-15 MED ORDER — MAGNESIUM SULFATE 2 GM/50ML IV SOLN: 2.0000 g | Freq: Every day | INTRAVENOUS | Status: DC | PRN

## 2016-06-15 MED ORDER — PHENYLEPHRINE HCL 10 MG/ML IJ SOLN
INTRAMUSCULAR | Status: DC | PRN
  Administered 2016-06-15: 160 ug via INTRAVENOUS

## 2016-06-15 MED ORDER — LIDOCAINE HCL 1 % IJ SOLN
INTRAMUSCULAR | Status: AC
  Filled 2016-06-15: qty 20

## 2016-06-15 MED ORDER — FLUTICASONE PROPIONATE 50 MCG/ACT NA SUSP
1.0000 | Freq: Every day | NASAL | Status: DC
  Filled 2016-06-15: qty 16

## 2016-06-15 MED ORDER — METFORMIN HCL 500 MG PO TABS
1000.0000 mg | ORAL_TABLET | Freq: Two times a day (BID) | ORAL | Status: DC
  Administered 2016-06-15: 1000 mg via ORAL
  Filled 2016-06-15: qty 2

## 2016-06-15 MED ORDER — PROPOFOL 10 MG/ML IV BOLUS
INTRAVENOUS | Status: AC
  Filled 2016-06-15: qty 20

## 2016-06-15 MED ORDER — ALPRAZOLAM 0.5 MG PO TABS: 0.5000 mg | ORAL_TABLET | Freq: Three times a day (TID) | ORAL | Status: DC | PRN

## 2016-06-15 MED ORDER — INSULIN ASPART 100 UNIT/ML ~~LOC~~ SOLN: 0.0000 [IU] | Freq: Three times a day (TID) | SUBCUTANEOUS | Status: DC

## 2016-06-15 MED ORDER — FENTANYL CITRATE (PF) 100 MCG/2ML IJ SOLN
INTRAMUSCULAR | Status: AC
  Filled 2016-06-15: qty 2

## 2016-06-15 MED ORDER — SODIUM CHLORIDE 0.9 % IV SOLN
INTRAVENOUS | Status: DC | PRN
  Administered 2016-06-15: 500 mL

## 2016-06-15 MED ORDER — LIDOCAINE HCL (CARDIAC) 20 MG/ML IV SOLN
INTRAVENOUS | Status: DC | PRN
  Administered 2016-06-15: 60 mg via INTRAVENOUS

## 2016-06-15 MED ORDER — REMIFENTANIL HCL 1 MG IV SOLR
0.0125 ug/kg/min | INTRAVENOUS | Status: AC
  Administered 2016-06-15: .15 ug/kg/min via INTRAVENOUS
  Filled 2016-06-15: qty 2000

## 2016-06-15 MED ORDER — SIMVASTATIN 40 MG PO TABS
40.0000 mg | ORAL_TABLET | Freq: Every day | ORAL | Status: DC
  Administered 2016-06-15: 40 mg via ORAL
  Filled 2016-06-15: qty 1

## 2016-06-15 MED ORDER — HYDRALAZINE HCL 20 MG/ML IJ SOLN: 5.0000 mg | INTRAMUSCULAR | Status: DC | PRN

## 2016-06-15 MED ORDER — SUGAMMADEX SODIUM 200 MG/2ML IV SOLN
INTRAVENOUS | Status: DC | PRN
  Administered 2016-06-15: 150 mg via INTRAVENOUS

## 2016-06-15 MED ORDER — ESMOLOL HCL 100 MG/10ML IV SOLN
INTRAVENOUS | Status: DC | PRN
  Administered 2016-06-15: 50 mg via INTRAVENOUS

## 2016-06-15 MED ORDER — BISACODYL 5 MG PO TBEC: 5.0000 mg | DELAYED_RELEASE_TABLET | Freq: Every day | ORAL | Status: DC | PRN

## 2016-06-15 MED ORDER — MORPHINE SULFATE (PF) 2 MG/ML IV SOLN: 1.0000 mg | INTRAVENOUS | Status: DC | PRN

## 2016-06-15 MED ORDER — SENNOSIDES-DOCUSATE SODIUM 8.6-50 MG PO TABS: 1.0000 | ORAL_TABLET | Freq: Every evening | ORAL | Status: DC | PRN

## 2016-06-15 MED ORDER — LACTATED RINGERS IV SOLN
INTRAVENOUS | Status: DC | PRN
  Administered 2016-06-15: 07:00:00 via INTRAVENOUS

## 2016-06-15 MED ORDER — DICLOFENAC SODIUM 75 MG PO TBEC
75.0000 mg | DELAYED_RELEASE_TABLET | Freq: Two times a day (BID) | ORAL | Status: DC
  Administered 2016-06-15: 75 mg via ORAL
  Filled 2016-06-15 (×2): qty 1

## 2016-06-15 MED ORDER — ONDANSETRON HCL 4 MG/2ML IJ SOLN
INTRAMUSCULAR | Status: DC | PRN
  Administered 2016-06-15: 4 mg via INTRAVENOUS

## 2016-06-15 MED ORDER — METOPROLOL TARTRATE 5 MG/5ML IV SOLN: 2.0000 mg | INTRAVENOUS | Status: DC | PRN

## 2016-06-15 MED ORDER — DEXTRAN 40 IN SALINE 10-0.9 % IV SOLN
INTRAVENOUS | Status: AC | PRN
  Administered 2016-06-15: 500 mL

## 2016-06-15 MED ORDER — PIOGLITAZONE HCL 45 MG PO TABS
45.0000 mg | ORAL_TABLET | Freq: Every day | ORAL | Status: DC
  Administered 2016-06-15: 45 mg via ORAL
  Filled 2016-06-15: qty 1

## 2016-06-15 MED ORDER — HYDROCODONE-ACETAMINOPHEN 5-325 MG PO TABS
1.0000 | ORAL_TABLET | ORAL | Status: DC | PRN
  Administered 2016-06-15 – 2016-06-16 (×3): 2 via ORAL
  Filled 2016-06-15 (×3): qty 2

## 2016-06-15 MED ORDER — ACETAMINOPHEN 650 MG RE SUPP: 325.0000 mg | RECTAL | Status: DC | PRN

## 2016-06-15 MED ORDER — DOCUSATE SODIUM 100 MG PO CAPS: 100.0000 mg | ORAL_CAPSULE | Freq: Every day | ORAL | Status: DC

## 2016-06-15 MED ORDER — ACETAMINOPHEN 325 MG PO TABS: 325.0000 mg | ORAL_TABLET | ORAL | Status: DC | PRN

## 2016-06-15 MED ORDER — ASPIRIN EC 81 MG PO TBEC
81.0000 mg | DELAYED_RELEASE_TABLET | Freq: Once | ORAL | Status: AC
  Administered 2016-06-15: 81 mg via ORAL

## 2016-06-15 MED ORDER — LOSARTAN POTASSIUM 50 MG PO TABS
50.0000 mg | ORAL_TABLET | Freq: Every day | ORAL | Status: DC
  Administered 2016-06-15: 50 mg via ORAL
  Filled 2016-06-15: qty 1

## 2016-06-15 MED ORDER — ONDANSETRON HCL 4 MG/2ML IJ SOLN: 4.0000 mg | Freq: Once | INTRAMUSCULAR | Status: DC | PRN

## 2016-06-15 MED ORDER — DEXTRAN 40 IN SALINE 10-0.9 % IV SOLN
INTRAVENOUS | Status: AC
  Filled 2016-06-15: qty 500

## 2016-06-15 MED ORDER — GUAIFENESIN-DM 100-10 MG/5ML PO SYRP: 15.0000 mL | ORAL_SOLUTION | ORAL | Status: DC | PRN

## 2016-06-15 MED ORDER — HEPARIN SODIUM (PORCINE) 1000 UNIT/ML IJ SOLN
INTRAMUSCULAR | Status: AC
  Filled 2016-06-15: qty 3

## 2016-06-15 MED ORDER — DEXTROSE 5 % IV SOLN
INTRAVENOUS | Status: DC | PRN
  Administered 2016-06-15: 1.5 g via INTRAVENOUS

## 2016-06-15 MED ORDER — CEFUROXIME SODIUM 1.5 G IV SOLR
1.5000 g | Freq: Two times a day (BID) | INTRAVENOUS | Status: DC
  Administered 2016-06-15: 1.5 g via INTRAVENOUS
  Filled 2016-06-15 (×2): qty 1.5

## 2016-06-15 MED ORDER — DEXTROSE 5 % IV SOLN
INTRAVENOUS | Status: AC
  Filled 2016-06-15: qty 1.5

## 2016-06-15 MED ORDER — FENTANYL CITRATE (PF) 100 MCG/2ML IJ SOLN
25.0000 ug | INTRAMUSCULAR | Status: DC | PRN
  Administered 2016-06-15: 50 ug via INTRAVENOUS
  Administered 2016-06-15 (×2): 25 ug via INTRAVENOUS

## 2016-06-15 MED ORDER — 0.9 % SODIUM CHLORIDE (POUR BTL) OPTIME
TOPICAL | Status: DC | PRN
  Administered 2016-06-15: 3000 mL

## 2016-06-15 MED ORDER — PHENOL 1.4 % MT LIQD: 1.0000 | OROMUCOSAL | Status: DC | PRN

## 2016-06-15 MED ORDER — ENSURE ENLIVE PO LIQD: 237.0000 mL | Freq: Two times a day (BID) | ORAL | Status: DC

## 2016-06-15 MED ORDER — ESMOLOL HCL 100 MG/10ML IV SOLN
INTRAVENOUS | Status: AC
  Filled 2016-06-15: qty 20

## 2016-06-15 MED ORDER — HEMOSTATIC AGENTS (NO CHARGE) OPTIME
TOPICAL | Status: DC | PRN
  Administered 2016-06-15: 1 via TOPICAL

## 2016-06-15 MED ORDER — POTASSIUM CHLORIDE CRYS ER 20 MEQ PO TBCR: 20.0000 meq | EXTENDED_RELEASE_TABLET | Freq: Every day | ORAL | Status: DC | PRN

## 2016-06-15 MED ORDER — LABETALOL HCL 5 MG/ML IV SOLN: 10.0000 mg | INTRAVENOUS | Status: DC | PRN

## 2016-06-15 MED ORDER — ENOXAPARIN SODIUM 40 MG/0.4ML ~~LOC~~ SOLN: 40.0000 mg | SUBCUTANEOUS | Status: DC

## 2016-06-15 SURGICAL SUPPLY — 46 items
BAG DECANTER FOR FLEXI CONT (MISCELLANEOUS) ×3 IMPLANT
CANISTER SUCT 3000ML PPV (MISCELLANEOUS) ×3 IMPLANT
CATH ROBINSON RED A/P 18FR (CATHETERS) ×3 IMPLANT
CLIP TI MEDIUM 24 (CLIP) ×3 IMPLANT
CLIP TI WIDE RED SMALL 24 (CLIP) ×3 IMPLANT
COVER PROBE W GEL 5X96 (DRAPES) ×3 IMPLANT
CRADLE DONUT ADULT HEAD (MISCELLANEOUS) ×3 IMPLANT
DERMABOND ADVANCED (GAUZE/BANDAGES/DRESSINGS) ×2
DERMABOND ADVANCED .7 DNX12 (GAUZE/BANDAGES/DRESSINGS) ×1 IMPLANT
ELECT REM PT RETURN 9FT ADLT (ELECTROSURGICAL) ×3
ELECTRODE REM PT RTRN 9FT ADLT (ELECTROSURGICAL) ×1 IMPLANT
GLOVE BIO SURGEON STRL SZ7 (GLOVE) ×3 IMPLANT
GLOVE BIOGEL PI IND STRL 7.5 (GLOVE) ×1 IMPLANT
GLOVE BIOGEL PI INDICATOR 7.5 (GLOVE) ×2
GOWN STRL NON-REIN LRG LVL3 (GOWN DISPOSABLE) ×3 IMPLANT
GOWN STRL REUS W/ TWL LRG LVL3 (GOWN DISPOSABLE) ×3 IMPLANT
GOWN STRL REUS W/TWL LRG LVL3 (GOWN DISPOSABLE) ×6
HEMOSTAT SPONGE AVITENE ULTRA (HEMOSTASIS) ×3 IMPLANT
IV ADAPTER SYR DOUBLE MALE LL (MISCELLANEOUS) IMPLANT
KIT BASIN OR (CUSTOM PROCEDURE TRAY) ×3 IMPLANT
KIT ROOM TURNOVER OR (KITS) ×3 IMPLANT
KIT SHUNT ARGYLE CAROTID ART 6 (VASCULAR PRODUCTS) ×3 IMPLANT
NEEDLE HYPO 25GX1X1/2 BEV (NEEDLE) IMPLANT
NS IRRIG 1000ML POUR BTL (IV SOLUTION) ×9 IMPLANT
PACK CAROTID (CUSTOM PROCEDURE TRAY) ×3 IMPLANT
PAD ARMBOARD 7.5X6 YLW CONV (MISCELLANEOUS) ×6 IMPLANT
PATCH VASC XENOSURE 1CMX6CM (Vascular Products) ×2 IMPLANT
PATCH VASC XENOSURE 1X6 (Vascular Products) ×1 IMPLANT
SET COLLECT BLD 21X3/4 12 PB (MISCELLANEOUS) IMPLANT
SHUNT CAROTID BYPASS 10 (VASCULAR PRODUCTS) IMPLANT
SHUNT CAROTID BYPASS 12FRX15.5 (VASCULAR PRODUCTS) IMPLANT
STOPCOCK 4 WAY LG BORE MALE ST (IV SETS) IMPLANT
SUT ETHILON 3 0 PS 1 (SUTURE) IMPLANT
SUT MNCRL AB 4-0 PS2 18 (SUTURE) ×3 IMPLANT
SUT PROLENE 6 0 BV (SUTURE) ×12 IMPLANT
SUT PROLENE 7 0 BV 1 (SUTURE) IMPLANT
SUT SILK 3 0 TIES 17X18 (SUTURE)
SUT SILK 3-0 18XBRD TIE BLK (SUTURE) IMPLANT
SUT VIC AB 3-0 SH 27 (SUTURE) ×2
SUT VIC AB 3-0 SH 27X BRD (SUTURE) ×1 IMPLANT
SYR 20CC LL (SYRINGE) ×3 IMPLANT
SYR TB 1ML LUER SLIP (SYRINGE) IMPLANT
SYSTEM CHEST DRAIN TLS 7FR (DRAIN) IMPLANT
TUBING ART PRESS 48 MALE/FEM (TUBING) IMPLANT
TUBING EXTENTION W/L.L. (IV SETS) IMPLANT
WATER STERILE IRR 1000ML POUR (IV SOLUTION) ×3 IMPLANT

## 2016-06-15 NOTE — Interval H&P Note (Signed)
History and Physical Interval Note:  06/15/2016 7:39 AM  [name] [name]  has presented today for surgery, with the diagnosis of Left carotid stenosis  The various methods of treatment have been discussed with the patient and family. After consideration of risks, benefits and other options for treatment, the patient has consented to  Procedure(s): ENDARTERECTOMY CAROTID (Left) as a surgical intervention .  The patient's history has been reviewed, patient examined, no change in status, stable for surgery.  I have reviewed the patient's chart and labs.  Questions were answered to the patient's satisfaction.     Adele Barthel

## 2016-06-15 NOTE — Op Note (Signed)
OPERATIVE NOTE  PROCEDURE:   1.  left carotid endarterectomy with bovine patch angioplasty 2.  left intraoperative carotid ultrasound 3.  Excision of left neck epidermal inclusion cyst  PRE-OPERATIVE DIAGNOSIS: left asymptomatic carotid stenosis >80%  POST-OPERATIVE DIAGNOSIS: same as above   SURGEON: Adele Barthel, MD  ASSISTANT(S): Gerri Lins, PAC   ANESTHESIA: general  ESTIMATED BLOOD LOSS: 100 cc  FINDING(S): 1.  Continuous Doppler audible flow signatures are appropriate for each carotid artery. 2.  No evidence of intimal flap visualized on transverse or longitudinal ultrasonography. 3.  Carotid plaque: non-necrotic core, near occluded, somewhat calcified 4.  Vagus nerve: normal position 5.  Hypoglossal nerve: visualized, did not required mobilization  SPECIMEN(S):  None   INDICATIONS:   [name] [name] is a 76 y.o. male who presents with left asymptomatic carotid stenosis >80%.  I discussed with the patient the risks, benefits, and alternatives to carotid endarterectomy.  I discussed the procedural details of carotid endarterectomy with the patient.  The patient is aware that the risks of carotid endarterectomy include but are not limited to: bleeding, infection, stroke, myocardial infarction, death, cranial nerve injuries both temporary and permanent, neck hematoma, possible airway compromise, labile blood pressure post-operatively, cerebral hyperperfusion syndrome, and possible need for additional interventions in the future. The patient is aware of the risks and agrees to proceed forward with the procedure.   DESCRIPTION: After full informed written consent was obtained from the patient, the patient was brought back to the operating room and placed supine upon the operating table.  Prior to induction, the patient received IV antibiotics.  After obtaining adequate anesthesia, the patient was placed into semi-Fowler position with a shoulder roll in place and the patient's  neck slightly hyperextended and rotated away from the surgical site.    The patient was prepped in the standard fashion for a left carotid endarterectomy.  I made an incision anterior to the sternocleidomastoid muscle and dissected down through the subcutaneous tissue.  The platysmas was opened with electrocautery.  Then I dissected down to the internal jugular vein.  This was dissected posteriorly until I obtained visualization of the common carotid artery.  This was dissected out and then an umbilical tape was placed around the common carotid artery and I loosely applied a Rumel tourniquet.  I then dissected in a periadventitial fashion along the common carotid artery up to the bifurcation.  I then identified the external carotid artery and the superior thyroid artery.  A 2-0 silk tie was looped around the superior thyroid artery, and I also dissected out the external carotid artery and placed a vessel loop around it.  In continuing the dissection to the internal carotid artery, I identified the facial vein.  This was ligated and then transected, giving me improved exposure of the internal carotid artery.  In the process of this dissection, the hypoglossal nerve was identified.  I then dissected out the internal carotid artery until I identified an area of soft tissue in the internal carotid artery.  I dissected slightly distal to this area, and placed an umbilical tape around the artery and loosely applied a Rumel tourniquet.    At this point, the patient was given 7000 units of Heparin intravenously, which was a therapeutic bolus. An additional 2000 units of Heparin was administered every hour after initial bolus to maintain anticoagulation.  In total, 9000 units of Heparin was administrated to achieve and maintain a therapeutic level of anticoagulation.  After waiting 3 minutes, then  I clamped the internal carotid artery, external carotid artery and then the common carotid artery.  I then made an  arteriotomy in the common carotid artery with a 11 blade, and extended the arteriotomy with a Potts scissor down into the common carotid artery, then I carried the arteriotomy through the bifurcation into the internal carotid artery until I reached an area that was not diseased.  At this point, I took the 8 mm shunt that previously been prepared and I inserted it into the internal carotid artery.  The fit was adequate so application of the Rumel was unneeded.  I unclamped the shunt to verify retrograde blood flow in the internal carotid artery.  This backbleeding was vigorous.  I then placed the other end of the shunt into the common carotid artery after unclamping the artery.  The Rumel was tightened down around the shunt.  At this point, I verified blood flow in the shunt with a continuous doppler.  At this point, I started the endarterectomy in the common carotid artery with a Technical brewer and carried this dissection down into the common carotid artery circumferentially.  Then I transected the plaque at a segment where it was adherent.  I then carried this dissection up into the external carotid artery.  The plaque was extracted by unclamping the external carotid artery and everting the artery.  The dissection was then carried into the internal carotid artery, extracting the remaining portion of the carotid plaque.  I passed the plaque off the field as a specimen.    I then spent the next 30 minutes removing intimal flaps and loose debris.  I felt that the plaque in the common carotid artery was causing >30% luminal compromise, so I elected to extended the endarterectomy more proximally.  I dissected more proximally and then applied a new Rumel tourniquet to the proximal common carotid artery.  I then removed the prior Rumel tourniquet and the extended the arteriotomy.  I extended the endarterectomy proximally until the plaque was densely adherent.  This residual plaque appeared to be only 15% of the  lumen.  I also noted a superficial flap in the distal internal carotid artery, so I had to extend the arteriotomy distal into the internal carotid artery.  I able to fully extract this superficial flap.  The residual plaque in the distal internal carotid artery was densely adherent.  The wall was flimsy enough that I felt a fully endarterectomy would likely result in transection of the artery.    Eventually I reached the point where the residual plaque was densely adherent and any further dissection would compromise the integrity of the wall.  After verifying that there was no more loose intimal flaps or debris, I re-interrogated the entirety of this carotid artery.  At this point, I was satisfied that the minimal remaining disease was densely adherent to the wall and wall integrity was intact.  At this point, I then fashioned a bovine pericardial patch for the geometry of this artery and sewed it in place with two running stitch of 6-0 Prolene, one from each end.  Prior to completing this patch angioplasty, I removed the shunt first from the internal carotid artery, from which there was excellent backbleeding, and clamped it.  Then I removed the shunt from the common carotid artery, from which there was excellent antegrade bleeding, and then clamped it.  At this point, I allowed the external carotid artery to backbleed, which was excellent.  Then I instilled heparinized saline  in this patched artery and then completed the patch angioplasty in the usual fashion.    At this point, I first released the clamp on the external carotid artery, then I released it on the common carotid artery.  After waiting a few seconds, I then released it on the internal carotid artery.  I then interrogated this patient's arteries with the continuous Doppler.  The audible waveforms in each artery were consistent with the expected characteristics for each artery.  The Sonosite probe was then sterilely draped and used to interrogate  the carotid artery in both longitudinal and transverse views.  At this point, I washed out the wound, and placed Avitene throughout.  I also gave the patient 40 mg of protamine to reverse his anticoagulation.   After waiting a few minutes, I removed the Avitene and washed out the wound.  There was no more active bleeding in the surgical site.     I then reapproximated the platysma muscle with a running stitch of 3-0 Vicryl.  The skin was then reapproximated with a running subcuticular 4-0 Monocryl stitch.  The skin was then cleaned, dried and LiquidBand was used to reinforce the skin closure.    At this point, I turned my attention to a lesion, I suspected was an epidermal inclusion cyst.  I had concerns without removal, this lesion could secondarily infect the neck incision given the proximity.  I made an incision with an 11-blade and then bluntly teased the majority of the cyst out of its cavity.  Briefly inspect verified this was an epidermal inclusion cyst without obvious secondary infection.  I removed a portion of residual cyst wall in the wound which appeared to be the back wall of the cyst.  I washed out this cyst cavity with sterile normal saline and then a bandage was applied to this wound.  I elected to allow this cavity to heal by secondary intent to avoid abscess formation.  The patient woke without any problems, neurologically intact.    COMPLICATIONS: none  CONDITION: stable   Adele Barthel, MD, [facility] [place] Office: [phone] Pager: [phone]  06/15/2016, 10:42 AM

## 2016-06-15 NOTE — Transfer of Care (Signed)
Immediate Anesthesia Transfer of Care Note  Patient: [name] [name]  Procedure(s) Performed: Procedure(s): ENDARTERECTOMY CAROTID LEFT (Left) PATCH ANGIOPLASTY USING XENOSURE BIOLOGIC PATCH (Left)  Patient Location: PACU  Anesthesia Type:General  Level of Consciousness: awake, alert  and patient cooperative  Airway & Oxygen Therapy: Patient Spontanous Breathing  Post-op Assessment: Report given to RN and Post -op Vital signs reviewed and stable  Post vital signs: Reviewed and stable  Last Vitals:  Vitals:   06/15/16 0617 06/15/16 1055  BP: (!) 130/55 107/81  Pulse: 70 (!) 103  Resp: 16 (!) 21  Temp: 36.5 C 36.9 C    Last Pain:  Vitals:   06/15/16 0617  TempSrc: Oral  PainSc:          Complications: No apparent anesthesia complications
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
## 2016-06-15 NOTE — Anesthesia Procedure Notes (Signed)
Procedure Name: Intubation Date/Time: 06/15/2016 8:02 AM Performed by: Salli Quarry Artice Bergerson Pre-anesthesia Checklist: Patient identified, Emergency Drugs available, Suction available and Patient being monitored Patient Re-evaluated:Patient Re-evaluated prior to inductionOxygen Delivery Method: Circle System Utilized Preoxygenation: Pre-oxygenation with 100% oxygen Intubation Type: IV induction Ventilation: Mask ventilation without difficulty Laryngoscope Size: Mac and 4 Grade View: Grade I Tube type: Oral Tube size: 7.5 mm Number of attempts: 1 Airway Equipment and Method: Stylet Placement Confirmation: ETT inserted through vocal cords under direct vision,  positive ETCO2 and breath sounds checked- equal and bilateral Secured at: 22 cm Tube secured with: Tape Dental Injury: Teeth and Oropharynx as per pre-operative assessment

## 2016-06-15 NOTE — Interval H&P Note (Signed)
   History and Physical Update   I discussed with the patient the risks, benefits, and alternatives to carotid endarterectomy.    The patient is aware that the risks of carotid endarterectomy include but are not limited to: bleeding, infection, stroke, myocardial infarction, death, cranial nerve injuries both temporary and permanent, neck hematoma, possible airway compromise, labile blood pressure post-operatively, cerebral hyperperfusion syndrome, and possible need for additional interventions in the future.   The patient is aware of the risks and agrees to proceed forward with the procedure.  Adele Barthel, MD, FACS [facility] Office: [phone] Pager: [phone]  06/15/2016, 7:39 AM

## 2016-06-16 ENCOUNTER — Telehealth: Payer: Self-pay | Admitting: Vascular Surgery

## 2016-06-16 ENCOUNTER — Encounter (HOSPITAL_COMMUNITY): Payer: Self-pay | Admitting: Vascular Surgery

## 2016-06-16 LAB — GLUCOSE, CAPILLARY: Glucose-Capillary: 217 mg/dL — ABNORMAL HIGH (ref 65–99)

## 2016-06-16 LAB — CBC
HEMATOCRIT: 32.1 % — AB (ref 39.0–52.0)
Hemoglobin: 10.5 g/dL — ABNORMAL LOW (ref 13.0–17.0)
MCH: 30.6 pg (ref 26.0–34.0)
MCHC: 32.7 g/dL (ref 30.0–36.0)
MCV: 93.6 fL (ref 78.0–100.0)
Platelets: 197 10*3/uL (ref 150–400)
RBC: 3.43 MIL/uL — ABNORMAL LOW (ref 4.22–5.81)
RDW: 13.8 % (ref 11.5–15.5)
WBC: 16.8 10*3/uL — ABNORMAL HIGH (ref 4.0–10.5)

## 2016-06-16 LAB — BASIC METABOLIC PANEL
ANION GAP: 8 (ref 5–15)
BUN: 13 mg/dL (ref 6–20)
CALCIUM: 8.1 mg/dL — AB (ref 8.9–10.3)
CO2: 23 mmol/L (ref 22–32)
CREATININE: 0.9 mg/dL (ref 0.61–1.24)
Chloride: 98 mmol/L — ABNORMAL LOW (ref 101–111)
GFR calc Af Amer: >60 mL/min (ref >60)
GFR calc non Af Amer: >60 mL/min (ref >60)
GLUCOSE: 250 mg/dL — AB (ref 65–99)
Potassium: 4.3 mmol/L (ref 3.5–5.1)
Sodium: 129 mmol/L — ABNORMAL LOW (ref 135–145)

## 2016-06-16 MED ORDER — HYDROCODONE-ACETAMINOPHEN 5-325 MG PO TABS: 1.0000 | ORAL_TABLET | Freq: Four times a day (QID) | ORAL | 0 refills | Status: DC | PRN

## 2016-06-16 NOTE — Telephone Encounter (Signed)
-----   Message from Mena Goes, RN sent at 06/16/2016  9:33 AM EDT ----- Regarding: 2-3 weeks    ----- Message ----- From: Ulyses Amor, PA-C Sent: 06/16/2016   7:17 AM To: Vvs Charge Pool  F/U with Dr. Bridgett Larsson in 2-3 weeks s/p left CEA

## 2016-06-16 NOTE — Anesthesia Postprocedure Evaluation (Signed)
Anesthesia Post Note  Patient: [name] [name]  Procedure(s) Performed: Procedure(s) (LRB): ENDARTERECTOMY CAROTID LEFT (Left) PATCH ANGIOPLASTY USING XENOSURE BIOLOGIC PATCH (Left)     Patient location during evaluation: PACU Anesthesia Type: General Level of consciousness: awake and alert Pain management: pain level controlled Vital Signs Assessment: post-procedure vital signs reviewed and stable Respiratory status: spontaneous breathing, nonlabored ventilation, respiratory function stable and patient connected to nasal cannula oxygen Cardiovascular status: blood pressure returned to baseline and stable Postop Assessment: no signs of nausea or vomiting Anesthetic complications: no    Last Vitals:  Vitals:   06/16/16 0210 06/16/16 0321  BP: (!) 125/54 (!) 109/54  Pulse: 89 70  Resp: 20   Temp:  36.6 C    Last Pain:  Vitals:   06/16/16 0321  TempSrc: Oral  PainSc:                  Karyl Kinnier Ellender

## 2016-06-16 NOTE — Telephone Encounter (Signed)
Sched appt 07/10/16 at 2:30. Spoke to pt to confirm appt.

## 2016-06-16 NOTE — Care Management Note (Signed)
Case Management Note  Patient Details  Name: [name] [name] MRN: [id number] Date of Birth: October 14, 1940  Subjective/Objective:   From home, s/p CEA, for discharge,no needs.                 Action/Plan:   Expected Discharge Date:  06/16/16               Expected Discharge Plan:  Home/Self Care  In-House Referral:     Discharge planning Services  CM Consult  Post Acute Care Choice:    Choice offered to:     DME Arranged:    DME Agency:     HH Arranged:    HH Agency:     Status of Service:  Completed, signed off  If discussed at [employer] of Stay Meetings, dates discussed:    Additional Comments:  Zenon Mayo, RN 06/16/2016, 9:58 AM

## 2016-06-16 NOTE — Progress Notes (Signed)
[facility]  Subjective  - Doing well.   Objective (!) 109/54 70 97.8 F (36.6 C) (Oral) 20 97%  Intake/Output Summary (Last 24 hours) at 06/16/16 0713 Last data filed at 06/16/16 0630  Gross per 24 hour  Intake           2267.5 ml  Output             2425 ml  Net           -157.5 ml    Grip 5/5 equal B UE Left neck incision without hematoma No tongue deviation and smile is symmetric  Assessment/Planning: POD # 1 left CEA  Discharge home after breakfast F/U in 2-3 weeks with Dr. Cloyde Reams, [facility] 06/16/2016 7:13 AM --  Laboratory Lab Results:  Recent Labs  06/15/16 1645 06/16/16 0234  WBC 15.1* 16.8*  HGB 11.8* 10.5*  HCT 36.1* 32.1*  PLT 194 197   BMET  Recent Labs  06/15/16 1645 06/16/16 0234  NA  --  129*  K  --  4.3  CL  --  98*  CO2  --  23  GLUCOSE  --  250*  BUN  --  13  CREATININE 0.90 0.90  CALCIUM  --  8.1*    COAG Lab Results  Component Value Date   INR 0.95 06/12/2016   No results found for: PTT

## 2016-06-16 NOTE — Discharge Summary (Signed)
Physician Discharge Summary  Patient ID: BRAILON DON MRN: [id number] DOB/AGE: [birth year] 76 y.o.  Admit date: 06/15/2016 Discharge date: 06/16/2016   Admission Diagnosis: Asx L ICA stenosis >80%  Discharge Diagnoses:  Same  Procedures: L carotid endarterectomy with bovine patch angioplasty L intraop carotid duplex  Discharged Condition: good  Hospital Course:  HIROTO SALTZMAN is a 76 y.o. (02/26/1940) male who underwent L CEA with BPA for asx L ICA stenosis >80%.  His procedure was unremarkable as was his post-op course.  By discharge on 06/16/2016, his neurologic status was completely intact with symmetric motor strength.  There is no evidence of any nerve palsy or neck hematoma.  Pt's pain is controlled, diet is tolerated and pt able to ambulate the halls.  He will be discharged with 3 week follow up.  Laboratory   CBC CBC Latest Ref Rng & Units 06/16/2016 06/15/2016 06/12/2016  WBC 4.0 - 10.5 K/uL 16.8(H) 15.1(H) 13.0(H)  Hemoglobin 13.0 - 17.0 g/dL 10.5(L) 11.8(L) 12.5(L)  Hematocrit 39.0 - 52.0 % 32.1(L) 36.1(L) 38.0(L)  Platelets 150 - 400 K/uL 197 194 249    BMP BMP Latest Ref Rng & Units 06/16/2016 06/15/2016 06/12/2016  Glucose 65 - 99 mg/dL 250(H) - 195(H)  BUN 6 - 20 mg/dL 13 - 10  Creatinine 0.61 - 1.24 mg/dL 0.90 0.90 0.78  BUN/Creat Ratio 6 - 22 Ratio - - -  Sodium 135 - 145 mmol/L 129(L) - 136  Potassium 3.5 - 5.1 mmol/L 4.3 - 4.4  Chloride 101 - 111 mmol/L 98(L) - 102  CO2 22 - 32 mmol/L 23 - 27  Calcium 8.9 - 10.3 mg/dL 8.1(L) - 9.6    Coagulation Lab Results  Component Value Date   INR 0.95 06/12/2016   No results found for: PTT  Lipids    Component Value Date/Time   CHOL 185 05/11/2016 0939   TRIG 125 05/11/2016 0939   HDL 45 05/11/2016 0939   CHOLHDL 4.1 05/11/2016 0939   VLDL 25 05/11/2016 0939   LDLCALC 115 (H) 05/11/2016 0939    Disposition: 01-Home or Self Care  Discharge Instructions    Call MD for:  redness, tenderness, or signs of infection  (pain, swelling, bleeding, redness, odor or green/yellow discharge around incision site)    Complete by:  As directed    Call MD for:  severe or increased pain, loss or decreased feeling  in affected limb(s)    Complete by:  As directed    Call MD for:  temperature >100.5    Complete by:  As directed    Discharge instructions    Complete by:  As directed    You may shower tonight and daily as needed.  Remove all dressings prior to shower.  Gradually get into your regular activity over the next 1-2 weeks.   Driving Restrictions    Complete by:  As directed    No driving for 1 week   Lifting restrictions    Complete by:  As directed    No heavy lifting for 3-4 weeks   Resume previous diet    Complete by:  As directed      Allergies as of 06/16/2016      Reactions   Invokana [canagliflozin] Palpitations, Rash, Other (See Comments)   Rash, tachycardia,weight loss      Medication List    TAKE these medications   acetaminophen 500 MG tablet Commonly known as:  TYLENOL Take 1,000 mg by mouth daily as needed for moderate  pain or headache.   ALPRAZolam 0.5 MG tablet Commonly known as:  XANAX TAKE 1 TABLET BY MOUTH THREE TIMES DAILY AS NEEDED What changed:  See the new instructions.   aspirin 81 MG tablet Take 81 mg by mouth daily.   diclofenac 75 MG EC tablet Commonly known as:  VOLTAREN Take 75 mg by mouth 2 (two) times daily.   fluticasone 50 MCG/ACT nasal spray Commonly known as:  FLONASE SHAKE WELL AND USE 2 SPRAYS IN EACH NOSTRIL DAILY   glipiZIDE 10 MG 24 hr tablet Commonly known as:  GLUCOTROL XL TAKE 1 TABLET BY MOUTH DAILY WITH BREAKFAST   glucose blood test strip Commonly known as:  ONETOUCH VERIO Check BS bid - tid - E11.9   HYDROcodone-acetaminophen 5-325 MG tablet Commonly known as:  NORCO/VICODIN Take 1 tablet by mouth every 6 (six) hours as needed for moderate pain (Must last 30 days.Do not take and drive a car or use machinery.). What changed:   Another medication with the same name was added. Make sure you understand how and when to take each.   HYDROcodone-acetaminophen 5-325 MG tablet Commonly known as:  NORCO/VICODIN Take 1 tablet by mouth every 6 (six) hours as needed for moderate pain (Must last 30 days.Do not take and drive a car or use machinery.). What changed:  You were already taking a medication with the same name, and this prescription was added. Make sure you understand how and when to take each.   BASAGLAR KWIKPEN 100 UNIT/ML Sopn Inject 10 Units into the skin daily as needed (high blood sugar). If blood glucose is under 200, hold med   Insulin Glargine 100 UNIT/ML Solostar Pen Commonly known as:  LANTUS SOLOSTAR Inject 15 units into the skin subcutaneously daily.   losartan 50 MG tablet Commonly known as:  COZAAR TAKE 1 TABLET BY MOUTH EVERY NIGHT AT BEDTIME   metFORMIN 1000 MG tablet Commonly known as:  GLUCOPHAGE TAKE 1 TABLET BY MOUTH TWICE DAILY WITH MEALS   onetouch ultrasoft lancets Checks bs bid - tid -E11.9   ONETOUCH VERIO w/Device Kit USE TO TEST BLOOD SUGAR LEVELS THREE TIMES DAILY   Pen Needles 31G X 8 MM Misc 1 each by Does not apply route daily.   pioglitazone 45 MG tablet Commonly known as:  ACTOS TAKE 1 TABLET BY MOUTH EVERY NIGHT AT BEDTIME   ranitidine 150 MG tablet Commonly known as:  ZANTAC Take 150 mg by mouth daily as needed for heartburn.   simvastatin 40 MG tablet Commonly known as:  ZOCOR TAKE 1 TABLET BY MOUTH DAILY AT 6 PM   sitaGLIPtin 100 MG tablet Commonly known as:  JANUVIA Take 1 tablet (100 mg total) by mouth daily.      Follow-up Information    Conrad Aguanga, MD Follow up in 2 week(s).   Specialties:  Vascular Surgery, Cardiology Why:  office will call with appt. Contact information: [address] [place] [address] [phone]           Signed:  Adele Barthel, MD, FACS [facility] [place] Office:  [phone] Pager: [phone]  06/16/2016, 7:59 AM

## 2016-06-17 LAB — HEMOGLOBIN A1C
Hgb A1c MFr Bld: 9.4 % — ABNORMAL HIGH (ref 4.8–5.6)
MEAN PLASMA GLUCOSE: 223 mg/dL

## 2016-06-22 ENCOUNTER — Ambulatory Visit (INDEPENDENT_AMBULATORY_CARE_PROVIDER_SITE_OTHER): Payer: PPO | Admitting: Physician Assistant

## 2016-06-22 ENCOUNTER — Encounter: Payer: Self-pay | Admitting: Physician Assistant

## 2016-06-22 VITALS — BP 130/74 | HR 68 | Temp 97.9°F | Resp 16 | Wt 148.0 lb

## 2016-06-22 DIAGNOSIS — E1165 Type 2 diabetes mellitus with hyperglycemia: Secondary | ICD-10-CM | POA: Diagnosis not present

## 2016-06-22 DIAGNOSIS — Z9114 Patient's other noncompliance with medication regimen: Secondary | ICD-10-CM

## 2016-06-22 DIAGNOSIS — IMO0002 Reserved for concepts with insufficient information to code with codable children: Secondary | ICD-10-CM

## 2016-06-22 DIAGNOSIS — Z794 Long term (current) use of insulin: Secondary | ICD-10-CM | POA: Diagnosis not present

## 2016-06-22 NOTE — Progress Notes (Signed)
Patient ID: JEANETTE MOFFATT MRN: [id number], DOB: 10-Nov-1940, 76 y.o. Date of Encounter: 06/22/2016, 2:52 PM    Chief Complaint:  Chief Complaint  Patient presents with  . discuss diabetes     HPI: 76 y.o. year old male presents for above.   His last routine office visit with me was 05/11/16. At that time A1c came back elevated at 9.7.  Reviewed that this was much higher than his A1c usually runs. Then found out that he had not been taking his insulin recently. Recommended he return come in for follow-up OV to further discuss. His wife accompanies him for visit today. They report that he had no real reason for why he stopped the insulin. He says that it was not because of cost. Wife says that "it's because he's stubborn". Nonetheless patient states that he is back on his insulin and is taking it as directed and will continue to do so. He reports that last night blood sugar reading was 116 and this morning it was 122. He has no other concerns to address today.     Home Meds:   Outpatient Medications Prior to Visit  Medication Sig Dispense Refill  . acetaminophen (TYLENOL) 500 MG tablet Take 1,000 mg by mouth daily as needed for moderate pain or headache.    . ALPRAZolam (XANAX) 0.5 MG tablet TAKE 1 TABLET BY MOUTH THREE TIMES DAILY AS NEEDED (Patient taking differently: TAKE 1 TABLET BY MOUTH THREE TIMES DAILY AS NEEDED FOR ANXIETY) 90 tablet 2  . aspirin 81 MG tablet Take 81 mg by mouth daily.    . Blood Glucose Monitoring Suppl (ONETOUCH VERIO) w/Device KIT USE TO TEST BLOOD SUGAR LEVELS THREE TIMES DAILY 1 kit 0  . diclofenac (VOLTAREN) 75 MG EC tablet Take 75 mg by mouth 2 (two) times daily.    . fluticasone (FLONASE) 50 MCG/ACT nasal spray SHAKE WELL AND USE 2 SPRAYS IN EACH NOSTRIL DAILY 16 g 3  . glipiZIDE (GLUCOTROL XL) 10 MG 24 hr tablet TAKE 1 TABLET BY MOUTH DAILY WITH BREAKFAST 90 tablet 0  . glucose blood (ONETOUCH VERIO) test strip Check BS bid - tid - E11.9 300 each 3    . HYDROcodone-acetaminophen (NORCO/VICODIN) 5-325 MG tablet Take 1 tablet by mouth every 6 (six) hours as needed for moderate pain (Must last 30 days.Do not take and drive a car or use machinery.). 90 tablet 0  . Insulin Glargine (LANTUS SOLOSTAR) 100 UNIT/ML Solostar Pen Inject 15 units into the skin subcutaneously daily. 5 pen 11  . Insulin Pen Needle (PEN NEEDLES) 31G X 8 MM MISC 1 each by Does not apply route daily. 100 each 3  . Lancets (ONETOUCH ULTRASOFT) lancets Checks bs bid - tid -E11.9 300 each 3  . losartan (COZAAR) 50 MG tablet TAKE 1 TABLET BY MOUTH EVERY NIGHT AT BEDTIME 90 tablet 2  . metFORMIN (GLUCOPHAGE) 1000 MG tablet TAKE 1 TABLET BY MOUTH TWICE DAILY WITH MEALS 180 tablet 0  . pioglitazone (ACTOS) 45 MG tablet TAKE 1 TABLET BY MOUTH EVERY NIGHT AT BEDTIME 90 tablet 2  . ranitidine (ZANTAC) 150 MG tablet Take 150 mg by mouth daily as needed for heartburn.    . simvastatin (ZOCOR) 40 MG tablet TAKE 1 TABLET BY MOUTH DAILY AT 6 PM 90 tablet 3  . Insulin Glargine (BASAGLAR KWIKPEN) 100 UNIT/ML SOPN Inject 10 Units into the skin daily as needed (high blood sugar). If blood glucose is under 200, hold med    .  sitaGLIPtin (JANUVIA) 100 MG tablet Take 1 tablet (100 mg total) by mouth daily. (Patient not taking: Reported on 06/10/2016) 30 tablet 3   No facility-administered medications prior to visit.     Allergies:  Allergies  Allergen Reactions  . Invokana [Canagliflozin] Palpitations, Rash and Other (See Comments)    Rash, tachycardia,weight loss       Review of Systems: See HPI for pertinent ROS. All other ROS negative.    Physical Exam: Blood pressure 130/74, pulse 68, temperature 97.9 F (36.6 C), temperature source Oral, resp. rate 16, weight 148 lb (67.1 kg), SpO2 94 %., Body mass index is 21.24 kg/m. General:  WNWD WM. Appears in no acute distress. Lungs: Clear bilaterally to auscultation without wheezes, rales, or rhonchi. Breathing is unlabored. Heart:  Regular rhythm. No murmurs, rubs, or gallops. Msk:  Strength and tone normal for age. Extremities/Skin: Warm and dry. Neuro: Alert and oriented X 3. Moves all extremities spontaneously. Gait is normal. CNII-XII grossly in tact. Psych:  Responds to questions appropriately with a normal affect.     ASSESSMENT AND PLAN:  76 y.o. year old male with  1. Insulin dependent type 2 diabetes mellitus, uncontrolled ([place])  2. Hx of medication noncompliance  He is back on his insulin and is now using the insulin as directed and checking blood sugars and monitoring this.  Will return for routine 3 month office visit. Follow-up sooner if needed.   [address] [place], [place], [facility] 06/22/2016 2:52 PM

## 2016-06-26 ENCOUNTER — Encounter: Payer: Self-pay | Admitting: Vascular Surgery

## 2016-07-01 ENCOUNTER — Telehealth: Payer: Self-pay | Admitting: Orthopaedic Surgery

## 2016-07-01 NOTE — Telephone Encounter (Signed)
Hydrocodone-Acetaminophen  5/325 mg  Qty 90 Tablets °

## 2016-07-02 MED ORDER — HYDROCODONE-ACETAMINOPHEN 5-325 MG PO TABS: 1.0000 | ORAL_TABLET | Freq: Four times a day (QID) | ORAL | 0 refills | Status: DC | PRN

## 2016-07-05 ENCOUNTER — Other Ambulatory Visit: Payer: Self-pay | Admitting: Physician Assistant

## 2016-07-09 NOTE — Progress Notes (Signed)
Postoperative Visit   History of Present Illness   [name] [name] is a 76 y.o. male who presents for postoperative follow-up for: left CEA, excision of L neck EIC (Date: 06/15/16).  The patient's neck incision is healed.  The patient has had no stroke or TIA symptoms.  Current Outpatient Prescriptions  Medication Sig Dispense Refill  . acetaminophen (TYLENOL) 500 MG tablet Take 1,000 mg by mouth daily as needed for moderate pain or headache.    . ALPRAZolam (XANAX) 0.5 MG tablet TAKE 1 TABLET BY MOUTH THREE TIMES DAILY AS NEEDED (Patient taking differently: TAKE 1 TABLET BY MOUTH THREE TIMES DAILY AS NEEDED FOR ANXIETY) 90 tablet 2  . aspirin 81 MG tablet Take 81 mg by mouth daily.    . Blood Glucose Monitoring Suppl (ONETOUCH VERIO) w/Device KIT USE TO TEST BLOOD SUGAR LEVELS THREE TIMES DAILY 1 kit 0  . diclofenac (VOLTAREN) 75 MG EC tablet Take 75 mg by mouth 2 (two) times daily.    . fluticasone (FLONASE) 50 MCG/ACT nasal spray SHAKE WELL AND USE 2 SPRAYS IN EACH NOSTRIL DAILY 16 g 3  . glipiZIDE (GLUCOTROL XL) 10 MG 24 hr tablet TAKE 1 TABLET BY MOUTH DAILY WITH BREAKFAST 90 tablet 0  . glucose blood (ONETOUCH VERIO) test strip Check BS bid - tid - E11.9 300 each 3  . HYDROcodone-acetaminophen (NORCO/VICODIN) 5-325 MG tablet Take 1 tablet by mouth every 6 (six) hours as needed for moderate pain (Must last 30 days.Do not take and drive a car or use machinery.). 90 tablet 0  . Insulin Glargine (LANTUS SOLOSTAR) 100 UNIT/ML Solostar Pen Inject 15 units into the skin subcutaneously daily. 5 pen 11  . Insulin Pen Needle (PEN NEEDLES) 31G X 8 MM MISC 1 each by Does not apply route daily. 100 each 3  . Lancets (ONETOUCH ULTRASOFT) lancets Checks bs bid - tid -E11.9 300 each 3  . losartan (COZAAR) 50 MG tablet TAKE 1 TABLET BY MOUTH EVERY NIGHT AT BEDTIME 90 tablet 2  . metFORMIN (GLUCOPHAGE) 1000 MG tablet TAKE 1 TABLET BY MOUTH TWICE DAILY WITH MEALS 180 tablet 0  . pioglitazone (ACTOS) 45  MG tablet TAKE 1 TABLET BY MOUTH EVERY NIGHT AT BEDTIME 90 tablet 2  . ranitidine (ZANTAC) 150 MG tablet Take 150 mg by mouth daily as needed for heartburn.    . simvastatin (ZOCOR) 40 MG tablet TAKE 1 TABLET BY MOUTH DAILY AT 6 PM 90 tablet 3   No current facility-administered medications for this visit.     For [facility]QI Use Only   PRE-ADM LIVING: Home  AMB STATUS: Ambulatory   Physical Examination   Vitals:   07/10/16 1412 07/10/16 1414  BP: (!) 141/73 137/74  Pulse: 90   Resp: 16   Temp: 97.2 F (36.2 C)   TempSrc: Oral   SpO2: 99%   Weight: 146 lb (66.2 kg)   Height: 5' 9.5" (1.765 m)     left Neck: Incision is healed  Neuro: CN 2-12 are intact, Motor strength is 5/5 bilaterally, sensation is grossly intact   Medical Decision Making   [name] [name] is a 76 y.o. male who presents s/p left CEA, exc of L neck EIC   The patient's neck incision is healing with no stroke symptoms. I discussed in depth with the patient the nature of atherosclerosis, and emphasized the importance of maximal medical management including strict control of blood pressure, blood glucose, and lipid levels, obtaining regular exercise, anti-platelet  use and cessation of smoking.   The patient is currently on a statin: Zocor.  The patient is currently on an anti-platelet: ASA. The patient is aware that without maximal medical management the underlying atherosclerotic disease process will progress, limiting the benefit of any interventions. The patient's surveillance will included routine carotid duplex studies which will be completed in: 9 months, at which time the patient will be re-evaluated.   I emphasized the importance of routine surveillance of the carotid arteries as recurrence of stenosis is possible, especially with proper management of underlying atherosclerotic disease. The patient agrees to participate in their maximal medical care and routine surveillance.  Thank you for allowing [place] to  participate in this patient's care.  Adele Barthel, MD, FACS [facility] Office: [phone] Pager: [phone]

## 2016-07-10 ENCOUNTER — Ambulatory Visit (INDEPENDENT_AMBULATORY_CARE_PROVIDER_SITE_OTHER): Payer: Self-pay | Admitting: Vascular Surgery

## 2016-07-10 ENCOUNTER — Encounter: Payer: Self-pay | Admitting: Vascular Surgery

## 2016-07-10 VITALS — BP 137/74 | HR 90 | Temp 97.2°F | Resp 16 | Ht 69.5 in | Wt 146.0 lb

## 2016-07-10 DIAGNOSIS — I739 Peripheral vascular disease, unspecified: Principal | ICD-10-CM

## 2016-07-10 DIAGNOSIS — I779 Disorder of arteries and arterioles, unspecified: Secondary | ICD-10-CM

## 2016-07-24 NOTE — Addendum Note (Signed)
Addended by: Lianne Cure A on: 07/24/2016 04:11 PM   Modules accepted: Orders

## 2016-07-29 ENCOUNTER — Telehealth: Payer: Self-pay | Admitting: Orthopaedic Surgery

## 2016-07-29 MED ORDER — HYDROCODONE-ACETAMINOPHEN 5-325 MG PO TABS: 1.0000 | ORAL_TABLET | Freq: Four times a day (QID) | ORAL | 0 refills | Status: DC | PRN

## 2016-07-29 NOTE — Telephone Encounter (Signed)
Hydrocodone-Acetaminophen  5/325 mg  Qty 90 Tablets °

## 2016-08-10 ENCOUNTER — Other Ambulatory Visit: Payer: Self-pay | Admitting: Physician Assistant

## 2016-08-11 NOTE — Telephone Encounter (Signed)
Refill appropriate 

## 2016-08-12 ENCOUNTER — Other Ambulatory Visit: Payer: Self-pay | Admitting: Physician Assistant

## 2016-08-13 NOTE — Telephone Encounter (Signed)
Last OV 6/11 Last refill 05/25/2016 Ok to refill?

## 2016-08-13 NOTE — Telephone Encounter (Signed)
Approved. # 90 + 2. 

## 2016-08-13 NOTE — Telephone Encounter (Signed)
Rx called in to pharmacy. 

## 2016-08-13 NOTE — Telephone Encounter (Deleted)
Last OV

## 2016-09-02 ENCOUNTER — Ambulatory Visit (INDEPENDENT_AMBULATORY_CARE_PROVIDER_SITE_OTHER): Payer: PPO | Admitting: Orthopaedic Surgery

## 2016-09-02 VITALS — BP 134/73 | HR 83 | Temp 97.3°F | Ht 69.5 in | Wt 146.0 lb

## 2016-09-02 DIAGNOSIS — G8929 Other chronic pain: Secondary | ICD-10-CM

## 2016-09-02 DIAGNOSIS — F17210 Nicotine dependence, cigarettes, uncomplicated: Secondary | ICD-10-CM | POA: Diagnosis not present

## 2016-09-02 DIAGNOSIS — M5441 Lumbago with sciatica, right side: Secondary | ICD-10-CM | POA: Diagnosis not present

## 2016-09-02 MED ORDER — HYDROCODONE-ACETAMINOPHEN 5-325 MG PO TABS: 1.0000 | ORAL_TABLET | Freq: Four times a day (QID) | ORAL | 0 refills | Status: DC | PRN
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
## 2016-10-01 ENCOUNTER — Telehealth: Payer: Self-pay | Admitting: Orthopaedic Surgery

## 2016-10-01 MED ORDER — HYDROCODONE-ACETAMINOPHEN 5-325 MG PO TABS: 1.0000 | ORAL_TABLET | Freq: Four times a day (QID) | ORAL | 0 refills | Status: DC | PRN

## 2016-10-01 NOTE — Telephone Encounter (Signed)
Patient requests refill on Hydrocodone/Acetaminophen  5-.325  Mgs.   Qty  90  Sig: Take 1 tablet by mouth every 6 (six) hours as needed for moderate pain (Must last 30 days.Do not take and drive a car or use machinery.).

## 2016-10-04 ENCOUNTER — Other Ambulatory Visit: Payer: Self-pay | Admitting: Physician Assistant

## 2016-10-05 NOTE — Telephone Encounter (Signed)
Filled per protocol

## 2016-10-10 ENCOUNTER — Other Ambulatory Visit: Payer: Self-pay | Admitting: Physician Assistant

## 2016-10-12 ENCOUNTER — Other Ambulatory Visit: Payer: Self-pay | Admitting: Physician Assistant

## 2016-10-14 ENCOUNTER — Other Ambulatory Visit: Payer: Self-pay | Admitting: Family Medicine

## 2016-10-15 ENCOUNTER — Other Ambulatory Visit: Payer: Self-pay | Admitting: Physician Assistant

## 2016-10-15 NOTE — Telephone Encounter (Signed)
Refill appropriate 

## 2016-10-22 ENCOUNTER — Ambulatory Visit (INDEPENDENT_AMBULATORY_CARE_PROVIDER_SITE_OTHER): Payer: PPO | Admitting: Physician Assistant

## 2016-10-22 ENCOUNTER — Encounter: Payer: Self-pay | Admitting: Physician Assistant

## 2016-10-22 VITALS — BP 138/78 | HR 82 | Temp 98.0°F | Resp 16 | Ht 69.5 in | Wt 147.0 lb

## 2016-10-22 DIAGNOSIS — I6529 Occlusion and stenosis of unspecified carotid artery: Secondary | ICD-10-CM

## 2016-10-22 DIAGNOSIS — I10 Essential (primary) hypertension: Secondary | ICD-10-CM

## 2016-10-22 DIAGNOSIS — E119 Type 2 diabetes mellitus without complications: Secondary | ICD-10-CM

## 2016-10-22 DIAGNOSIS — F419 Anxiety disorder, unspecified: Secondary | ICD-10-CM

## 2016-10-22 DIAGNOSIS — Z23 Encounter for immunization: Secondary | ICD-10-CM

## 2016-10-22 DIAGNOSIS — E785 Hyperlipidemia, unspecified: Secondary | ICD-10-CM

## 2016-10-22 DIAGNOSIS — Z794 Long term (current) use of insulin: Secondary | ICD-10-CM
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
## 2016-10-23 LAB — COMPLETE METABOLIC PANEL WITH GFR
AG RATIO: 1.7 (calc) (ref 1.0–2.5)
ALBUMIN MSPROF: 4.1 g/dL (ref 3.6–5.1)
ALKALINE PHOSPHATASE (APISO): 49 U/L (ref 40–115)
ALT: 11 U/L (ref 9–46)
AST: 12 U/L (ref 10–35)
BILIRUBIN TOTAL: 0.4 mg/dL (ref 0.2–1.2)
BUN: 14 mg/dL (ref 7–25)
CHLORIDE: 101 mmol/L (ref 98–110)
CO2: 27 mmol/L (ref 20–32)
Calcium: 9.6 mg/dL (ref 8.6–10.3)
Creat: 0.85 mg/dL (ref 0.70–1.18)
GFR, EST AFRICAN AMERICAN: 99 mL/min/{1.73_m2} (ref >=60)
GFR, Est Non African American: 85 mL/min/{1.73_m2} (ref >=60)
GLOBULIN: 2.4 g/dL (ref 1.9–3.7)
GLUCOSE: 156 mg/dL — AB (ref 65–99)
POTASSIUM: 4.6 mmol/L (ref 3.5–5.3)
SODIUM: 138 mmol/L (ref 135–146)
TOTAL PROTEIN: 6.5 g/dL (ref 6.1–8.1)

## 2016-10-23 LAB — HEMOGLOBIN A1C
Hgb A1c MFr Bld: 9.4 % of total Hgb — ABNORMAL HIGH (ref <5.7)
Mean Plasma Glucose: 223 (calc)
eAG (mmol/L): 12.4 (calc)

## 2016-10-26 ENCOUNTER — Encounter: Payer: Self-pay | Admitting: *Deleted

## 2016-10-29 ENCOUNTER — Telehealth: Payer: Self-pay | Admitting: Orthopaedic Surgery

## 2016-10-29 MED ORDER — HYDROCODONE-ACETAMINOPHEN 5-325 MG PO TABS: 1.0000 | ORAL_TABLET | Freq: Four times a day (QID) | ORAL | 0 refills | Status: DC | PRN

## 2016-10-29 NOTE — Telephone Encounter (Signed)
Patient requests refill on Hydrocodone/Acetaminophen 5-325  Mgs.   Qty  90  Sig: Take 1 tablet by mouth every 6 (six) hours as needed for moderate pain (Must last 30 days.Do not take and drive a car or use machinery.).

## 2016-11-06 ENCOUNTER — Other Ambulatory Visit: Payer: Self-pay | Admitting: Physician Assistant

## 2016-11-06 NOTE — Telephone Encounter (Signed)
Medication refilled per protocol. 

## 2016-11-12 ENCOUNTER — Ambulatory Visit: Payer: PPO | Admitting: Physician Assistant

## 2016-11-21 ENCOUNTER — Other Ambulatory Visit: Payer: Self-pay | Admitting: Physician Assistant

## 2016-11-22 ENCOUNTER — Other Ambulatory Visit: Payer: Self-pay | Admitting: Physician Assistant

## 2016-11-23 NOTE — Telephone Encounter (Signed)
Last OV 10/22/2016 Last refill 08/13/2016 Ok to refill?

## 2016-11-23 NOTE — Telephone Encounter (Signed)
Approved. # 90 + 2. 

## 2016-11-23 NOTE — Telephone Encounter (Signed)
rx called into pharmacy

## 2016-12-01 ENCOUNTER — Ambulatory Visit: Payer: PPO | Admitting: Orthopaedic Surgery

## 2016-12-01 ENCOUNTER — Encounter: Payer: Self-pay | Admitting: Orthopaedic Surgery

## 2016-12-01 VITALS — BP 130/65 | HR 100 | Temp 97.0°F | Ht 69.5 in | Wt 145.0 lb

## 2016-12-01 DIAGNOSIS — F17210 Nicotine dependence, cigarettes, uncomplicated: Secondary | ICD-10-CM

## 2016-12-01 DIAGNOSIS — G8929 Other chronic pain: Secondary | ICD-10-CM

## 2016-12-01 DIAGNOSIS — M5441 Lumbago with sciatica, right side: Secondary | ICD-10-CM | POA: Diagnosis not present

## 2016-12-01 MED ORDER — HYDROCODONE-ACETAMINOPHEN 5-325 MG PO TABS: 1.0000 | ORAL_TABLET | Freq: Four times a day (QID) | ORAL | 0 refills | Status: DC | PRN

## 2016-12-01 NOTE — Patient Instructions (Signed)
Steps to Quit Smoking Smoking tobacco can be bad for your health. It can also affect almost every organ in your body. Smoking puts you and people around you at risk for many serious long-lasting (chronic) diseases. Quitting smoking is hard, but it is one of the best things that you can do for your health. It is never too late to quit. What are the benefits of quitting smoking? When you quit smoking, you lower your risk for getting serious diseases and conditions. They can include:  Lung cancer or lung disease.  Heart disease.  Stroke.  Heart attack.  Not being able to have children (infertility).  Weak bones (osteoporosis) and broken bones (fractures).  If you have coughing, wheezing, and shortness of breath, those symptoms may get better when you quit. You may also get sick less often. If you are pregnant, quitting smoking can help to lower your chances of having a baby of low birth weight. What can I do to help me quit smoking? Talk with your doctor about what can help you quit smoking. Some things you can do (strategies) include:  Quitting smoking totally, instead of slowly cutting back how much you smoke over a period of time.  Going to in-person counseling. You are more likely to quit if you go to many counseling sessions.  Using resources and support systems, such as: ? Online chats with a counselor. ? Phone quitlines. ? Printed self-help materials. ? Support groups or group counseling. ? Text messaging programs. ? Mobile phone apps or applications.  Taking medicines. Some of these medicines may have nicotine in them. If you are pregnant or breastfeeding, do not take any medicines to quit smoking unless your doctor says it is okay. Talk with your doctor about counseling or other things that can help you.  Talk with your doctor about using more than one strategy at the same time, such as taking medicines while you are also going to in-person counseling. This can help make  quitting easier. What things can I do to make it easier to quit? Quitting smoking might feel very hard at first, but there is a lot that you can do to make it easier. Take these steps:  Talk to your family and friends. Ask them to support and encourage you.  Call phone quitlines, reach out to support groups, or work with a counselor.  Ask people who smoke to not smoke around you.  Avoid places that make you want (trigger) to smoke, such as: ? Bars. ? Parties. ? Smoke-break areas at work.  Spend time with people who do not smoke.  Lower the stress in your life. Stress can make you want to smoke. Try these things to help your stress: ? Getting regular exercise. ? Deep-breathing exercises. ? Yoga. ? Meditating. ? Doing a body scan. To do this, close your eyes, focus on one area of your body at a time from head to toe, and notice which parts of your body are tense. Try to relax the muscles in those areas.  Download or buy apps on your mobile phone or tablet that can help you stick to your quit plan. There are many free apps, such as QuitGuide from the CDC (Centers for Disease Control and Prevention). You can find more support from smokefree.gov and other websites.  This information is not intended to replace advice given to you by your health care provider. Make sure you discuss any questions you have with your health care provider. Document Released: 10/25/2008 Document   Revised: 08/27/2015 Document Reviewed: 05/15/2014 Elsevier Interactive Patient Education  2018 [employer]  
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
## 2016-12-01 NOTE — Progress Notes (Signed)
b

## 2016-12-02 ENCOUNTER — Ambulatory Visit: Payer: PPO | Admitting: Orthopaedic Surgery

## 2016-12-21 ENCOUNTER — Other Ambulatory Visit: Payer: Self-pay | Admitting: Physician Assistant

## 2016-12-23 NOTE — Telephone Encounter (Signed)
Refill appropriate 

## 2016-12-29 ENCOUNTER — Other Ambulatory Visit: Payer: Self-pay | Admitting: Physician Assistant

## 2016-12-29 NOTE — Telephone Encounter (Signed)
Refill appropriate 

## 2016-12-30 ENCOUNTER — Telehealth: Payer: Self-pay | Admitting: Orthopaedic Surgery

## 2016-12-30 MED ORDER — HYDROCODONE-ACETAMINOPHEN 5-325 MG PO TABS: 1.0000 | ORAL_TABLET | Freq: Four times a day (QID) | ORAL | 0 refills | Status: DC | PRN

## 2016-12-30 NOTE — Telephone Encounter (Signed)
Patient requests refill on Hydrocodone/Acetaminophen 5-325 mgs.   Qty  90  Sig: Take 1 tablet every 6 (six) hours as needed by mouth for moderate pain (Must last 30 days.Do not take and drive a car or use machinery.).

## 2017-01-24 ENCOUNTER — Other Ambulatory Visit: Payer: Self-pay | Admitting: Physician Assistant

## 2017-01-27 ENCOUNTER — Telehealth: Payer: Self-pay | Admitting: Orthopaedic Surgery

## 2017-01-27 MED ORDER — HYDROCODONE-ACETAMINOPHEN 5-325 MG PO TABS: 1.0000 | ORAL_TABLET | Freq: Four times a day (QID) | ORAL | 0 refills | Status: DC | PRN

## 2017-01-27 NOTE — Telephone Encounter (Signed)
Hydrocodone-Acetaminophen  5/325 mg  Qty  90 Tablets.   Patient uses [facility]

## 2017-03-03 ENCOUNTER — Ambulatory Visit (INDEPENDENT_AMBULATORY_CARE_PROVIDER_SITE_OTHER): Payer: PPO | Admitting: Orthopaedic Surgery

## 2017-03-03 ENCOUNTER — Encounter: Payer: Self-pay | Admitting: Orthopaedic Surgery

## 2017-03-03 VITALS — BP 141/68 | Temp 97.4°F | Ht <= 58 in | Wt 179.8 lb

## 2017-03-03 DIAGNOSIS — M79641 Pain in right hand: Secondary | ICD-10-CM

## 2017-03-03 DIAGNOSIS — M5441 Lumbago with sciatica, right side: Secondary | ICD-10-CM | POA: Diagnosis not present

## 2017-03-03 DIAGNOSIS — G8929 Other chronic pain: Secondary | ICD-10-CM

## 2017-03-03 DIAGNOSIS — F17210 Nicotine dependence, cigarettes, uncomplicated: Secondary | ICD-10-CM | POA: Diagnosis not present

## 2017-03-03 MED ORDER — HYDROCODONE-ACETAMINOPHEN 5-325 MG PO TABS: 1.0000 | ORAL_TABLET | Freq: Four times a day (QID) | ORAL | 0 refills | Status: DC | PRN

## 2017-03-03 NOTE — Patient Instructions (Signed)
Steps to Quit Smoking Smoking tobacco can be bad for your health. It can also affect almost every organ in your body. Smoking puts you and people around you at risk for many serious long-lasting (chronic) diseases. Quitting smoking is hard, but it is one of the best things that you can do for your health. It is never too late to quit. What are the benefits of quitting smoking? When you quit smoking, you lower your risk for getting serious diseases and conditions. They can include:  Lung cancer or lung disease.  Heart disease.  Stroke.  Heart attack.  Not being able to have children (infertility).  Weak bones (osteoporosis) and broken bones (fractures).  If you have coughing, wheezing, and shortness of breath, those symptoms may get better when you quit. You may also get sick less often. If you are pregnant, quitting smoking can help to lower your chances of having a baby of low birth weight. What can I do to help me quit smoking? Talk with your doctor about what can help you quit smoking. Some things you can do (strategies) include:  Quitting smoking totally, instead of slowly cutting back how much you smoke over a period of time.  Going to in-person counseling. You are more likely to quit if you go to many counseling sessions.  Using resources and support systems, such as: ? Online chats with a counselor. ? Phone quitlines. ? Printed self-help materials. ? Support groups or group counseling. ? Text messaging programs. ? Mobile phone apps or applications.  Taking medicines. Some of these medicines may have nicotine in them. If you are pregnant or breastfeeding, do not take any medicines to quit smoking unless your doctor says it is okay. Talk with your doctor about counseling or other things that can help you.  Talk with your doctor about using more than one strategy at the same time, such as taking medicines while you are also going to in-person counseling. This can help make  quitting easier. What things can I do to make it easier to quit? Quitting smoking might feel very hard at first, but there is a lot that you can do to make it easier. Take these steps:  Talk to your family and friends. Ask them to support and encourage you.  Call phone quitlines, reach out to support groups, or work with a counselor.  Ask people who smoke to not smoke around you.  Avoid places that make you want (trigger) to smoke, such as: ? Bars. ? Parties. ? Smoke-break areas at work.  Spend time with people who do not smoke.  Lower the stress in your life. Stress can make you want to smoke. Try these things to help your stress: ? Getting regular exercise. ? Deep-breathing exercises. ? Yoga. ? Meditating. ? Doing a body scan. To do this, close your eyes, focus on one area of your body at a time from head to toe, and notice which parts of your body are tense. Try to relax the muscles in those areas.  Download or buy apps on your mobile phone or tablet that can help you stick to your quit plan. There are many free apps, such as QuitGuide from the CDC (Centers for Disease Control and Prevention). You can find more support from smokefree.gov and other websites.  This information is not intended to replace advice given to you by your health care provider. Make sure you discuss any questions you have with your health care provider. Document Released: 10/25/2008 Document   Revised: 08/27/2015 Document Reviewed: 05/15/2014 Elsevier Interactive Patient Education  2018 [employer]  
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
## 2017-03-09 ENCOUNTER — Other Ambulatory Visit: Payer: Self-pay | Admitting: Physician Assistant

## 2017-03-09 NOTE — Telephone Encounter (Signed)
Last OV 10/22/2016 Last refill 11/23/2016 Ok to refill? xanax

## 2017-03-30 ENCOUNTER — Other Ambulatory Visit: Payer: Self-pay | Admitting: Orthopaedic Surgery

## 2017-03-30 MED ORDER — HYDROCODONE-ACETAMINOPHEN 5-325 MG PO TABS: 1.0000 | ORAL_TABLET | Freq: Four times a day (QID) | ORAL | 0 refills | Status: DC | PRN

## 2017-03-30 NOTE — Telephone Encounter (Signed)
Patient called for refill - aware that while Dr [name] is out of clinic, Dr Aline Brochure reviewing requests: HYDROcodone-acetaminophen (NORCO/VICODIN) 5-325 MG tablet 1 tablet Oral Every 6 hours PRN  - [employer], [place], [place]

## 2017-04-07 ENCOUNTER — Telehealth: Payer: Self-pay

## 2017-04-07 ENCOUNTER — Ambulatory Visit (INDEPENDENT_AMBULATORY_CARE_PROVIDER_SITE_OTHER): Payer: PPO | Admitting: Family Medicine

## 2017-04-07 ENCOUNTER — Encounter: Payer: Self-pay | Admitting: Family Medicine

## 2017-04-07 VITALS — BP 152/70 | HR 80 | Temp 98.0°F | Resp 16 | Wt 147.0 lb

## 2017-04-07 DIAGNOSIS — Z794 Long term (current) use of insulin: Secondary | ICD-10-CM

## 2017-04-07 DIAGNOSIS — F17200 Nicotine dependence, unspecified, uncomplicated: Secondary | ICD-10-CM

## 2017-04-07 DIAGNOSIS — I639 Cerebral infarction, unspecified: Secondary | ICD-10-CM

## 2017-04-07 DIAGNOSIS — I6529 Occlusion and stenosis of unspecified carotid artery: Secondary | ICD-10-CM

## 2017-04-07 DIAGNOSIS — I10 Essential (primary) hypertension: Secondary | ICD-10-CM

## 2017-04-07 DIAGNOSIS — R29818 Other symptoms and signs involving the nervous system: Secondary | ICD-10-CM

## 2017-04-07 DIAGNOSIS — E119 Type 2 diabetes mellitus without complications: Secondary | ICD-10-CM

## 2017-04-07 MED ORDER — ROSUVASTATIN CALCIUM 40 MG PO TABS: 40.0000 mg | ORAL_TABLET | Freq: Every day | ORAL | 3 refills | Status: DC
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
## 2017-04-07 NOTE — Telephone Encounter (Signed)
[name] called and left a message that patient was having memory problems as well as not feeling well. [name] wanted to address some concerns before scheduling an appointment.   Call was placed to [facility] no answer [email or web address]

## 2017-04-08 LAB — CBC WITH DIFFERENTIAL/PLATELET
BASOS ABS: 112 {cells}/uL (ref 0–200)
Basophils Relative: 0.9 %
EOS ABS: 186 {cells}/uL (ref 15–500)
EOS PCT: 1.5 %
HCT: 38.7 % (ref 38.5–50.0)
HEMOGLOBIN: 13.1 g/dL — AB (ref 13.2–17.1)
Lymphs Abs: 4228 cells/uL — ABNORMAL HIGH (ref 850–3900)
MCH: 30.8 pg (ref 27.0–33.0)
MCHC: 33.9 g/dL (ref 32.0–36.0)
MCV: 91.1 fL (ref 80.0–100.0)
MONOS PCT: 7.3 %
MPV: 10.4 fL (ref 7.5–12.5)
NEUTROS ABS: 6969 {cells}/uL (ref 1500–7800)
NEUTROS PCT: 56.2 %
Platelets: 288 10*3/uL (ref 140–400)
RBC: 4.25 10*6/uL (ref 4.20–5.80)
RDW: 13.4 % (ref 11.0–15.0)
Total Lymphocyte: 34.1 %
WBC mixed population: 905 cells/uL (ref 200–950)
WBC: 12.4 10*3/uL — ABNORMAL HIGH (ref 3.8–10.8)

## 2017-04-08 LAB — COMPLETE METABOLIC PANEL WITH GFR
AG Ratio: 1.8 (calc) (ref 1.0–2.5)
ALBUMIN MSPROF: 4.2 g/dL (ref 3.6–5.1)
ALKALINE PHOSPHATASE (APISO): 46 U/L (ref 40–115)
ALT: 15 U/L (ref 9–46)
AST: 13 U/L (ref 10–35)
BILIRUBIN TOTAL: 0.3 mg/dL (ref 0.2–1.2)
BUN: 19 mg/dL (ref 7–25)
CHLORIDE: 102 mmol/L (ref 98–110)
CO2: 26 mmol/L (ref 20–32)
Calcium: 9.6 mg/dL (ref 8.6–10.3)
Creat: 0.8 mg/dL (ref 0.70–1.18)
GFR, Est African American: 101 mL/min/{1.73_m2} (ref >=60)
GFR, Est Non African American: 87 mL/min/{1.73_m2} (ref >=60)
GLOBULIN: 2.3 g/dL (ref 1.9–3.7)
Glucose, Bld: 133 mg/dL — ABNORMAL HIGH (ref 65–99)
Potassium: 4.4 mmol/L (ref 3.5–5.3)
SODIUM: 136 mmol/L (ref 135–146)
Total Protein: 6.5 g/dL (ref 6.1–8.1)

## 2017-04-08 LAB — LDL CHOLESTEROL, DIRECT: Direct LDL: 109 mg/dL — ABNORMAL HIGH (ref <100)

## 2017-04-08 LAB — HEMOGLOBIN A1C
HEMOGLOBIN A1C: 9.3 %{Hb} — AB (ref <5.7)
MEAN PLASMA GLUCOSE: 220 (calc)
eAG (mmol/L): 12.2 (calc)

## 2017-04-11 ENCOUNTER — Ambulatory Visit
Admission: RE | Admit: 2017-04-11 | Discharge: 2017-04-11 | Disposition: A | Discharge status: Home / Self Care | Payer: PPO | Source: Ambulatory Visit | Attending: Family Medicine | Admitting: Family Medicine

## 2017-04-11 ENCOUNTER — Other Ambulatory Visit: Payer: Self-pay | Admitting: Physician Assistant

## 2017-04-11 DIAGNOSIS — R29818 Other symptoms and signs involving the nervous system: Secondary | ICD-10-CM

## 2017-04-11 DIAGNOSIS — R413 Other amnesia: Secondary | ICD-10-CM | POA: Diagnosis not present

## 2017-04-11 DIAGNOSIS — I639 Cerebral infarction, unspecified: Secondary | ICD-10-CM

## 2017-04-11 MED ORDER — GADOBENATE DIMEGLUMINE 529 MG/ML IV SOLN
13.0000 mL | Freq: Once | INTRAVENOUS | Status: AC | PRN
  Administered 2017-04-11: 13 mL via INTRAVENOUS

## 2017-04-12 ENCOUNTER — Encounter: Payer: Self-pay | Admitting: Family Medicine

## 2017-04-12 ENCOUNTER — Ambulatory Visit (INDEPENDENT_AMBULATORY_CARE_PROVIDER_SITE_OTHER): Payer: PPO | Admitting: Family Medicine

## 2017-04-12 VITALS — BP 132/60 | HR 90 | Temp 97.9°F | Resp 16 | Ht 69.5 in | Wt 153.0 lb

## 2017-04-12 DIAGNOSIS — Z794 Long term (current) use of insulin: Secondary | ICD-10-CM | POA: Diagnosis not present

## 2017-04-12 DIAGNOSIS — E785 Hyperlipidemia, unspecified: Secondary | ICD-10-CM

## 2017-04-12 DIAGNOSIS — F17200 Nicotine dependence, unspecified, uncomplicated: Secondary | ICD-10-CM | POA: Diagnosis not present

## 2017-04-12 DIAGNOSIS — I6529 Occlusion and stenosis of unspecified carotid artery: Secondary | ICD-10-CM

## 2017-04-12 DIAGNOSIS — I639 Cerebral infarction, unspecified: Secondary | ICD-10-CM | POA: Diagnosis not present

## 2017-04-12 DIAGNOSIS — E1165 Type 2 diabetes mellitus with hyperglycemia: Secondary | ICD-10-CM | POA: Diagnosis not present

## 2017-04-12 DIAGNOSIS — IMO0002 Reserved for concepts with insufficient information to code with codable children: Secondary | ICD-10-CM

## 2017-04-12 MED ORDER — ASPIRIN-DIPYRIDAMOLE ER 25-200 MG PO CP12
1.0000 | ORAL_CAPSULE | Freq: Two times a day (BID) | ORAL | 3 refills | Status: DC
Start: 2017-04-12 — End: 2017-08-20

## 2017-04-12 MED ORDER — DULAGLUTIDE 0.75 MG/0.5ML ~~LOC~~ SOAJ: 0.7500 mg | SUBCUTANEOUS | 3 refills | Status: DC
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
## 2017-04-13 ENCOUNTER — Other Ambulatory Visit: Payer: Self-pay | Admitting: Family Medicine

## 2017-04-13 ENCOUNTER — Telehealth: Payer: Self-pay | Admitting: Physician Assistant

## 2017-04-13 DIAGNOSIS — E119 Type 2 diabetes mellitus without complications: Secondary | ICD-10-CM

## 2017-04-13 MED ORDER — PEN NEEDLES 31G X 8 MM MISC: 1.0000 | Freq: Every day | 3 refills | Status: DC

## 2017-04-13 NOTE — Telephone Encounter (Signed)
trulicity pen needles need to be called in per pt. [facility] [address] [place].

## 2017-04-14 NOTE — Telephone Encounter (Signed)
Pen needles sent  in 04/13/2017

## 2017-04-16 ENCOUNTER — Encounter (HOSPITAL_COMMUNITY): Payer: PPO

## 2017-04-16 ENCOUNTER — Ambulatory Visit: Payer: PPO | Admitting: Vascular Surgery

## 2017-04-20 NOTE — Progress Notes (Signed)
Established Carotid Patient   History of Present Illness   [name] [name] is a 77 y.o. (18-Nov-1940) male who presents with chief complaint: recent CVA.  Prior procedures include: L CEA, L EIC excision (06/15/16).   Patient had an episode in March 2019 of aphasia and cognitive dysfunction (memory loss, pick up wrong items at store).  MRI Head demonstrated sub-acute left basal ganglia CVA.  His PCP put the patient on Aggrenox. The patient has never had amaurosis fugax or monocular blindness.  The patient has never had facial drooping or hemiplegia.  The patient has never had receptive aphasia.    The patient's PMH, PSH, SH, and FamHx were reviewed on 04/21/17 are unchanged from 07/10/16.  Current Outpatient Medications  Medication Sig Dispense Refill  . ALPRAZolam (XANAX) 0.5 MG tablet TAKE 1 TABLET BY MOUTH THREE TIMES DAILY AS NEEDED 90 tablet 0  . aspirin 81 MG tablet Take 81 mg by mouth daily.    . Blood Glucose Monitoring Suppl (ONETOUCH VERIO) w/Device KIT USE TO TEST BLOOD SUGAR LEVELS THREE TIMES DAILY 1 kit 0  . diclofenac (VOLTAREN) 75 MG EC tablet Take 75 mg by mouth 2 (two) times daily.    Marland Kitchen dipyridamole-aspirin (AGGRENOX) 200-25 MG 12hr capsule Take 1 capsule by mouth 2 (two) times daily. 60 capsule 3  . Dulaglutide (TRULICITY) 6.33 HL/4.5GY SOPN Inject 0.75 mg into the skin once a week. 4 pen 3  . fluticasone (FLONASE) 50 MCG/ACT nasal spray SHAKE WELL AND USE 2 SPRAYS IN EACH NOSTRIL DAILY 16 g 3  . glipiZIDE (GLUCOTROL XL) 10 MG 24 hr tablet TAKE 1 TABLET BY MOUTH DAILY WITH BREAKFAST 90 tablet 0  . HYDROcodone-acetaminophen (NORCO/VICODIN) 5-325 MG tablet Take 1 tablet by mouth every 6 (six) hours as needed for moderate pain (Must last 30 days). 90 tablet 0  . Insulin Glargine (LANTUS SOLOSTAR) 100 UNIT/ML Solostar Pen Inject 15 units into the skin subcutaneously daily. 5 pen 11  . Insulin Pen Needle (PEN NEEDLES) 31G X 8 MM MISC 1 each by Does not apply route daily. 100  each 3  . Lancets (ONETOUCH ULTRASOFT) lancets Checks bs bid - tid -E11.9 300 each 3  . losartan (COZAAR) 50 MG tablet TAKE 1 TABLET BY MOUTH EVERY NIGHT AT BEDTIME 90 tablet 2  . metFORMIN (GLUCOPHAGE) 1000 MG tablet TAKE 1 TABLET BY MOUTH TWICE DAILY WITH MEALS 180 tablet 1  . ONETOUCH VERIO test strip USE TO TEST BLOOD SUGAR 2 TO 3 TIMES DAILY 250 each 1  . pioglitazone (ACTOS) 45 MG tablet TAKE 1 TABLET BY MOUTH EVERY NIGHT AT BEDTIME 30 tablet 0  . ranitidine (ZANTAC) 150 MG tablet Take 150 mg by mouth daily as needed for heartburn.    . rosuvastatin (CRESTOR) 40 MG tablet Take 1 tablet (40 mg total) by mouth daily. 90 tablet 3   No current facility-administered medications for this visit.     On ROS today: no further CVA or TIA sx, continue memory deficits   Physical Examination   Vitals:   04/21/17 1539 04/21/17 1545  BP: 119/70 130/68  Pulse: 85 87  Resp: 16   Temp: 98.7 F (37.1 C)   TempSrc: Oral   SpO2: 97%   Weight: 147 lb (66.7 kg)   Height: _0  (1.753 m)    Body mass index is 21.71 kg/m.  General Alert, O x 3, WD, NAD  Neck Supple, mid-line trachea,    Pulmonary Sym exp, good B air  movt, CTA B  Cardiac RRR, Nl S1, S2, no Murmurs, No rubs, No S3,S4  Vascular Vessel Right Left  Radial Palpable Palpable  Brachial Palpable Palpable  Carotid Palpable, No Bruit Palpable, No Bruit  Aorta Not palpable N/A  Femoral Palpable Palpable  Popliteal Not palpable Not palpable  PT Faintly palpable Faintly palpable  DP Faintly palpable Faintly palpable    Gastro- intestinal soft, non-distended, non-tender to palpation, No guarding or rebound, no HSM, no masses, no CVAT B, No palpable prominent aortic pulse,    Musculo- skeletal M/S 5/5 throughout  , Extremities without ischemic changes    Neurologic Cranial nerves 2-12 intact , Pain and light touch intact in extremities , Motor exam as listed above    Non-Invasive Vascular Imaging   B Carotid Duplex (04/21/2017):     R ICA stenosis:  40-59%  R VA: patent and antegrade  L ICA stenosis:  1-39%, mild hyperplasia in CEA  L VA: patent and antegrade   Medical Decision Making   [name] [name] is a 77 y.o. male who presents with: s/p L CEA for asx ICA stenosis >80%, asx R ICA stenosis 40-59%   Patient's recent CVA is unrelated to his L CEA, so further work-up for possible embolic source (TTE, loop recorder) recommended.  Pt is comfortable with his PCP managing the work-up.  Based on the patient's vascular studies and examination, I have offered the patient: annual B carotid duplex.  I discussed in depth with the patient the nature of atherosclerosis, and emphasized the importance of maximal medical management including strict control of blood pressure, blood glucose, and lipid levels, antiplatelet agents, obtaining regular exercise, and cessation of smoking.    The patient is aware that without maximal medical management the underlying atherosclerotic disease process will progress, limiting the benefit of any interventions.  The patient is currently on a statin: Crestor.   The patient is currently on an anti-platelet: ASA.  Thank you for allowing [place] to participate in this patient's care.   Adele Barthel, MD, FACS [facility] Office: [phone] Pager: [phone]  === VASCULAR QUALITY INITIATIVE FOLLOW UP DATA:  Current smoker: [x  ] yes  [  ] no  Living status: [  x]  Home  [  ] Nursing home  [  ] Homeless    MEDS:  ASA [ x ] yes  [  ] no- [  ] medical reason  [  ] non compliant  STATIN  [x  ] yes  [  ] no- [  ] medical reason  [  ] non compliant  Beta blocker [  ] yes  [ x ] no- [  ] medical reason  [  ] non compliant  ACE inhibitor [  ] yes  [ x ] no- [  ] medical reason  [  ] non compliant  P2Y12 Antagonist [ x ] none  [  ] clopidogrel-Plavix  [  ] ticlopidine-Ticlid   [  ] prasugrel-Effient  [  ] ticagrelor- Brilinta    Anticoagulant [ x ]  None  [  ] warfarin  [  ] rivaroxaban-Xarelto [  ] dabigatran- Pradaxa  Neurologic event since D/C:  [ x ] no  [  ] yes: [  ] eye event  [  ] cortical event  [  ] VB event  [  ] non specific event  [  ] right  [  ] left  [  ]  TIA  [  ] stroke  Date:   Modified Rankin Score: 0  MI since D/C: [ x ] no  [  ] troponin only  [  ] EKG or clinical  Cranial nerve injury: [ x ] none  [  ] resolved  [  ] persistent  Duplex CEA site: [  ] no  [  x] yes - PSV= 145  EDV= 37  ICA/CCA ratio: 1.2  Stenosis= [ x ] <40% [  ] 40-59% [  ] 60-79%  [  ] > 80%  [  ]  Occluded  CEA site re-operation:  [ x ] no   [  ] yes- date of re-op:  CEA site PCI:   [ x ] no   [  ] yes- date of PCI:

## 2017-04-21 ENCOUNTER — Encounter: Payer: Self-pay | Admitting: Vascular Surgery

## 2017-04-21 ENCOUNTER — Ambulatory Visit (HOSPITAL_COMMUNITY)
Admission: RE | Admit: 2017-04-21 | Discharge: 2017-04-21 | Disposition: A | Discharge status: Home / Self Care | Payer: PPO | Source: Ambulatory Visit | Attending: Vascular Surgery | Admitting: Vascular Surgery

## 2017-04-21 ENCOUNTER — Other Ambulatory Visit: Payer: Self-pay

## 2017-04-21 ENCOUNTER — Ambulatory Visit (INDEPENDENT_AMBULATORY_CARE_PROVIDER_SITE_OTHER): Payer: PPO | Admitting: Vascular Surgery

## 2017-04-21 VITALS — BP 130/68 | HR 87 | Temp 98.7°F | Resp 16 | Ht 69.0 in | Wt 147.0 lb

## 2017-04-21 DIAGNOSIS — I6523 Occlusion and stenosis of bilateral carotid arteries: Secondary | ICD-10-CM

## 2017-04-21 DIAGNOSIS — I779 Disorder of arteries and arterioles, unspecified: Secondary | ICD-10-CM | POA: Diagnosis not present

## 2017-04-21 DIAGNOSIS — I739 Peripheral vascular disease, unspecified: Secondary | ICD-10-CM

## 2017-04-25 ENCOUNTER — Other Ambulatory Visit: Payer: Self-pay | Admitting: Physician Assistant

## 2017-04-26 NOTE — Telephone Encounter (Signed)
Last OV 10/22/2016 Last refill 03/10/2017 Ok to refill?

## 2017-04-29 ENCOUNTER — Other Ambulatory Visit: Payer: Self-pay | Admitting: Orthopedic Surgery

## 2017-04-29 MED ORDER — HYDROCODONE-ACETAMINOPHEN 5-325 MG PO TABS: 1.0000 | ORAL_TABLET | Freq: Four times a day (QID) | ORAL | 0 refills | Status: DC | PRN

## 2017-04-29 NOTE — Telephone Encounter (Signed)
Patient called for refill:  HYDROcodone-acetaminophen (NORCO/VICODIN) 5-325 MG tablet 90 tablet

## 2017-05-16 ENCOUNTER — Other Ambulatory Visit: Payer: Self-pay | Admitting: Physician Assistant

## 2017-05-25 ENCOUNTER — Other Ambulatory Visit: Payer: Self-pay | Admitting: Family Medicine

## 2017-06-01 ENCOUNTER — Ambulatory Visit: Payer: PPO | Admitting: Orthopaedic Surgery

## 2017-06-01 ENCOUNTER — Encounter: Payer: Self-pay | Admitting: Orthopaedic Surgery

## 2017-06-01 VITALS — BP 102/66 | HR 90 | Ht 69.5 in | Wt 145.0 lb

## 2017-06-01 DIAGNOSIS — F17210 Nicotine dependence, cigarettes, uncomplicated: Secondary | ICD-10-CM | POA: Diagnosis not present

## 2017-06-01 DIAGNOSIS — G8929 Other chronic pain: Secondary | ICD-10-CM

## 2017-06-01 DIAGNOSIS — M5441 Lumbago with sciatica, right side: Secondary | ICD-10-CM

## 2017-06-01 DIAGNOSIS — I10 Essential (primary) hypertension: Secondary | ICD-10-CM | POA: Diagnosis not present

## 2017-06-01 MED ORDER — HYDROCODONE-ACETAMINOPHEN 5-325 MG PO TABS: 1.0000 | ORAL_TABLET | Freq: Four times a day (QID) | ORAL | 0 refills | Status: DC | PRN
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
## 2017-06-02 ENCOUNTER — Other Ambulatory Visit: Payer: Self-pay | Admitting: Family Medicine

## 2017-06-02 ENCOUNTER — Other Ambulatory Visit: Payer: Self-pay | Admitting: Physician Assistant

## 2017-06-03 NOTE — Telephone Encounter (Signed)
Ok to refill??  Last office visit 04/12/2017.  Last refill 04/26/2017.

## 2017-06-03 NOTE — Telephone Encounter (Signed)
I am ok refilling this but he should try to wean down on this medication as it can contribute to memory problems.

## 2017-06-19 ENCOUNTER — Other Ambulatory Visit: Payer: Self-pay | Admitting: Physician Assistant

## 2017-06-24 ENCOUNTER — Other Ambulatory Visit: Payer: Self-pay | Admitting: Physician Assistant

## 2017-07-01 ENCOUNTER — Telehealth: Payer: Self-pay | Admitting: Orthopaedic Surgery

## 2017-07-01 MED ORDER — HYDROCODONE-ACETAMINOPHEN 5-325 MG PO TABS: 1.0000 | ORAL_TABLET | Freq: Four times a day (QID) | ORAL | 0 refills | Status: DC | PRN

## 2017-07-01 NOTE — Telephone Encounter (Signed)
Hydrocodone-Acetaminophen 5/325 mg Qty  90 Tablets ° °PATIENT USES [facility] ON [address] °

## 2017-07-06 ENCOUNTER — Other Ambulatory Visit: Payer: Self-pay | Admitting: Family Medicine

## 2017-07-07 NOTE — Telephone Encounter (Signed)
Pt is requesting refill on Xanax   LOV: 04/12/17  LRF:   06/03/17

## 2017-07-11 ENCOUNTER — Other Ambulatory Visit: Payer: Self-pay | Admitting: Family Medicine

## 2017-07-12 ENCOUNTER — Ambulatory Visit: Payer: PPO | Admitting: Family Medicine

## 2017-07-19 DIAGNOSIS — C44319 Basal cell carcinoma of skin of other parts of face: Secondary | ICD-10-CM | POA: Diagnosis not present

## 2017-07-19 DIAGNOSIS — L720 Epidermal cyst: Secondary | ICD-10-CM | POA: Diagnosis not present

## 2017-07-19 DIAGNOSIS — C44619 Basal cell carcinoma of skin of left upper limb, including shoulder: Secondary | ICD-10-CM | POA: Diagnosis not present

## 2017-07-20 ENCOUNTER — Ambulatory Visit: Payer: PPO | Admitting: Family Medicine

## 2017-07-22 ENCOUNTER — Other Ambulatory Visit: Payer: Self-pay

## 2017-07-22 ENCOUNTER — Encounter: Payer: Self-pay | Admitting: Family Medicine

## 2017-07-22 NOTE — Patient Outreach (Signed)
[place] [facility]) Care Management  07/22/2017  [name] [name] [birth year] [id number]  TELEPHONE SCREENING Referral date: 07/14/17 Referral source: primary MD  Referral reason: medication assistance.  Insurance: Health team advantage Attempt #1  Telephone call to patient regarding primary MD.  HIPAA verified by patient. Explained reason for call. Patient states he is having difficulty affording his trulicity and lantas medication .  Patient states he does not have any medication assistance program at this time. .  Patient states he is not able to talk at this time and request call back.   PLAN: RNCM will attempt 2nd telephone call to patient within 4 business day.  RNCm will send patient outreach letter.   Quinn Plowman RN,BSN,CCM [facility] Telephonic  [phone]

## 2017-07-25 ENCOUNTER — Other Ambulatory Visit: Payer: Self-pay | Admitting: Physician Assistant

## 2017-07-26 ENCOUNTER — Other Ambulatory Visit: Payer: Self-pay

## 2017-07-26 NOTE — Patient Outreach (Addendum)
[facility]) Care Management  07/26/2017  [name] [name] 04-09-1940 [id number]  TELEPHONE SCREENING Referral date: 07/14/17 Referral source: primary MD  Referral reason: medication assistance.  Insurance: Health team advantage  Telephone call to patient regarding primary MD referral. HIPAA verified with patient. Explained reason for call. Patient states he is having trouble affording his Trulicity and lantas medication.  Patient states he has bee diabetic for approximately 15 years. He states he is unsure of what his A1c is. Patient states, " I figured if it was bad they would have told me."  Patient states he his blood sugars range from the 90's to 250's.  RNCM discussed and offered [facility] care management services. Patient verbally agreed.    ASSESSMENT:  Patient would benefit from diabetes education with RN health coach and follow up with [facility] pharmacist for medication assistance.   PLAN: RNCM will refer patient to [facility] health coach and pharmacist.  Advance directive packet mailed to patient at his request.  Quinn Plowman RN,BSN,CCM [facility] Telephonic  [phone]

## 2017-07-29 ENCOUNTER — Other Ambulatory Visit: Payer: Self-pay

## 2017-07-29 ENCOUNTER — Telehealth: Payer: Self-pay | Admitting: Orthopaedic Surgery

## 2017-07-29 MED ORDER — HYDROCODONE-ACETAMINOPHEN 5-325 MG PO TABS: 1.0000 | ORAL_TABLET | Freq: Four times a day (QID) | ORAL | 0 refills | Status: DC | PRN

## 2017-07-29 NOTE — Patient Outreach (Signed)
[facility]) Care Management  07/29/2017  [name] [name] 11/18/1940 [id number]  77 year old referred to [place] Management. [place] services requested for medication asstance for Trulicity .  PMHx includes, but not limited to, hypertension, carotid disease, Type 2 diabetes mellitus, dementia, and hyperlipidemia.  Successful outreach call placed to [name] [name].  HIPAA identifiers verified.    Medication Assistance: [name] [name] reports that she manages all of her husbands medications because he has dementia.  She states that he has Partial Extra Help LIS and has to pay $113/mo. for his Trulicity.  She states that his Lantus and Aggrenox cost ~$55/month each.  She is states that she is most concerned with the cost of Trulicity.  [employer], the manufacturer of Trulicity requires an out of pocket (OOP) expenditure of $1100 to apply to their patient assistance program.  Per Health[employer] Advantage, Mr. [name] [name] is [phone].  Patient can write a letter to [place] asking them to waive the OOP.  Another option would be to switch to Bydureon or Byetta, similar drugs made by [employer], which requires a 3% OOP  which is ~ $830.  [name] [name] states that she would like to apply for Trulicity PAP at this time.  [name] [name] will see if PCP has any samples in the interim.    Lantus manufacturer, Sanofi, requires an OOP of 5% to apply to the PAP.  He has not met this requirement.   Patient also on Aggrenox made by [employer] who does not require an OOP.  Will apply for Aggrenox patient assistance.   Plan: Route note to PCP, Dr. Dennard [name].  Route note to [facility] CPhT, [name] to begin patient assistance applications for Trulicity [employer])  and Aggrenox (BI).  [name], PharmD Clinical Pharmacist [place] [phone]

## 2017-07-29 NOTE — Telephone Encounter (Signed)
Patient requests refill on Hydrocodone/Acetaminophen 5-325  Mgs.   Qty   90       Sig: Take 1 tablet by mouth every 6 (six) hours as needed for moderate pain (Must last 30 days).

## 2017-08-03 ENCOUNTER — Ambulatory Visit: Payer: PPO | Admitting: Family Medicine

## 2017-08-03 ENCOUNTER — Other Ambulatory Visit: Payer: Self-pay | Admitting: Pharmacy Technician

## 2017-08-03 ENCOUNTER — Encounter: Payer: Self-pay | Admitting: Family Medicine

## 2017-08-03 ENCOUNTER — Ambulatory Visit (INDEPENDENT_AMBULATORY_CARE_PROVIDER_SITE_OTHER): Payer: PPO | Admitting: Family Medicine

## 2017-08-03 VITALS — BP 110/62 | HR 72 | Temp 97.7°F | Resp 14 | Wt 148.0 lb

## 2017-08-03 DIAGNOSIS — E785 Hyperlipidemia, unspecified: Secondary | ICD-10-CM

## 2017-08-03 DIAGNOSIS — E118 Type 2 diabetes mellitus with unspecified complications: Secondary | ICD-10-CM

## 2017-08-03 DIAGNOSIS — F17200 Nicotine dependence, unspecified, uncomplicated: Secondary | ICD-10-CM | POA: Diagnosis not present

## 2017-08-03 DIAGNOSIS — E1165 Type 2 diabetes mellitus with hyperglycemia: Secondary | ICD-10-CM | POA: Diagnosis not present

## 2017-08-03 DIAGNOSIS — I639 Cerebral infarction, unspecified: Secondary | ICD-10-CM | POA: Diagnosis not present

## 2017-08-03 DIAGNOSIS — IMO0002 Reserved for concepts with insufficient information to code with codable children: Secondary | ICD-10-CM

## 2017-08-03 NOTE — Patient Outreach (Signed)
[facility]) Care Management  08/03/2017  [name] [name] 1940-04-20 [id number]   Received [employer] patient assistance referral from [place] for [employer]. Prepared patient portion to be mailed. Faxed provider portion to Dr. [name].  Will follow up with patient in 7-10 business days to confirm application has been received.  Maud Deed [place], [place] Management [phone]
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
## 2017-08-04 ENCOUNTER — Other Ambulatory Visit: Payer: PPO

## 2017-08-04 ENCOUNTER — Other Ambulatory Visit: Payer: Self-pay

## 2017-08-04 NOTE — Patient Outreach (Signed)
[facility]) Care Management  08/04/2017  CHRISHUN SCHEER 1940/08/02 [id number]   Successful outreach call to Mr. Haroon, wife [name].  HIPAA identifiers verified.    Informed Mrs. Preble that we cannot apply for Aggrenox patient assistance from [employer] because they do accept patients who have Extra Help LIS.  Mrs.  Awan states that she thinks she can afford the $55/mo for this prescription.  She states that her main concern with the getting help with his Trulicity.    Plan: Follow along with [name], CPhT for his patient assistance for Trulicity   [name], [place] [phone]

## 2017-08-05 ENCOUNTER — Other Ambulatory Visit: Payer: PPO

## 2017-08-05 DIAGNOSIS — E118 Type 2 diabetes mellitus with unspecified complications: Secondary | ICD-10-CM | POA: Diagnosis not present

## 2017-08-05 DIAGNOSIS — E1165 Type 2 diabetes mellitus with hyperglycemia: Secondary | ICD-10-CM | POA: Diagnosis not present

## 2017-08-06 LAB — COMPLETE METABOLIC PANEL WITH GFR
AG RATIO: 2 (calc) (ref 1.0–2.5)
ALKALINE PHOSPHATASE (APISO): 42 U/L (ref 40–115)
ALT: 14 U/L (ref 9–46)
AST: 14 U/L (ref 10–35)
Albumin: 3.9 g/dL (ref 3.6–5.1)
BILIRUBIN TOTAL: 0.3 mg/dL (ref 0.2–1.2)
BUN: 17 mg/dL (ref 7–25)
CHLORIDE: 103 mmol/L (ref 98–110)
CO2: 30 mmol/L (ref 20–32)
Calcium: 9.3 mg/dL (ref 8.6–10.3)
Creat: 0.81 mg/dL (ref 0.70–1.18)
GFR, Est African American: 100 mL/min/{1.73_m2} (ref >=60)
GFR, Est Non African American: 86 mL/min/{1.73_m2} (ref >=60)
GLUCOSE: 155 mg/dL — AB (ref 65–99)
Globulin: 2 g/dL (calc) (ref 1.9–3.7)
POTASSIUM: 4.3 mmol/L (ref 3.5–5.3)
Sodium: 139 mmol/L (ref 135–146)
Total Protein: 5.9 g/dL — ABNORMAL LOW (ref 6.1–8.1)

## 2017-08-06 LAB — LIPID PANEL
CHOLESTEROL: 102 mg/dL (ref <200)
HDL: 44 mg/dL (ref >40)
LDL Cholesterol (Calc): 39 mg/dL (calc)
Non-HDL Cholesterol (Calc): 58 mg/dL (calc) (ref <130)
TRIGLYCERIDES: 108 mg/dL (ref <150)
Total CHOL/HDL Ratio: 2.3 (calc) (ref <5.0)

## 2017-08-06 LAB — HEMOGLOBIN A1C
EAG (MMOL/L): 12.2 (calc)
Hgb A1c MFr Bld: 9.3 % of total Hgb — ABNORMAL HIGH (ref <5.7)
Mean Plasma Glucose: 220 (calc)

## 2017-08-10 ENCOUNTER — Other Ambulatory Visit: Payer: Self-pay

## 2017-08-10 NOTE — Patient Outreach (Signed)
[place] [facility]) Care Management  08/10/2017  [name] [name] 03-19-1940 [id number]    1st outreach attempt to the patient for initial assessment. Wife answered the phone (she is on the consent) stated that she was unable to talk to day and asked if I could call at another time.   Plan: RN Health Coach will make another outreach attempt to the patient within the next four business days.  Lazaro Arms RN, BSN, [place] Direct Dial:  [phone]  Fax: [phone]

## 2017-08-11 ENCOUNTER — Other Ambulatory Visit: Payer: PPO

## 2017-08-12 ENCOUNTER — Ambulatory Visit: Payer: Self-pay

## 2017-08-17 ENCOUNTER — Other Ambulatory Visit: Payer: Self-pay

## 2017-08-17 NOTE — Patient Outreach (Signed)
[facility]) Care Management  08/17/2017   [name] [name] Sep 03, 1940 [id number]    Outreach attempt # 2 to the patient for initial assessment. HIPAA verified by Bayard Hugger wife ( on release of information). Wife helped to complete initial assessment.  Social: The patient lives in the home with his wife. She helps to take care of him and is very supportive.  He is able to ambulate and perform his ADLS independently.  He is independent/assist with his IADLS. The patient has transportation to his appointments. Durable medical equipment in the home consists of a CBG meter blood pressure cuff and glasses.  Conditions: The patients conditions include DM type 2, HTN, Anxiety , Hyperlipidemia and CVA. The patient's last a1c on 08/05/2017 was 9.3. The patient has a one touch verio meter. His blood sugar this morning was 109. The wife states that the patient has some memory issues   Medications:The patient takes 12 medications.   He needs assistance with his medications.  Wife states that he has difficulties affording his Trulicity. [facility] pharmacy is assisting the patient with medication assistance forms.  Appointments:  The patient has an appointment with Dr [name] on 08/03/2017.  He is seen every three months.   Advanced Directives: The patient and wife don not have a Health care power of attorney.  The information has been sent to them so they can fill it out.   Current Medications:  Current Outpatient Medications  Medication Sig Dispense Refill  . ALPRAZolam (XANAX) 0.5 MG tablet TAKE 1 TABLET BY MOUTH THREE TIMES DAILY AS NEEDED 60 tablet 0  . Blood Glucose Monitoring Suppl (ONETOUCH VERIO) w/Device KIT USE TO TEST BLOOD SUGAR LEVELS THREE TIMES DAILY 1 kit 0  . diclofenac (VOLTAREN) 75 MG EC tablet Take 75 mg by mouth 2 (two) times daily.    Marland Kitchen dipyridamole-aspirin (AGGRENOX) 200-25 MG 12hr capsule Take 1 capsule by mouth 2 (two) times daily. 60 capsule 3  . Dulaglutide  (TRULICITY) 5.78 IO/9.6EX SOPN Inject 0.75 mg into the skin once a week. 4 pen 3  . fluticasone (FLONASE) 50 MCG/ACT nasal spray SHAKE WELL AND USE 2 SPRAYS IN EACH NOSTRIL DAILY 16 g 3  . HYDROcodone-acetaminophen (NORCO/VICODIN) 5-325 MG tablet Take 1 tablet by mouth every 6 (six) hours as needed for moderate pain (Must last 30 days). 90 tablet 0  . Insulin Glargine (LANTUS SOLOSTAR) 100 UNIT/ML Solostar Pen ADMINISTER 15 UNITS UNDER THE SKIN DAILY 15 mL 0  . Insulin Pen Needle (PEN NEEDLES) 31G X 8 MM MISC 1 each by Does not apply route daily. 100 each 3  . losartan (COZAAR) 50 MG tablet TAKE 1 TABLET BY MOUTH EVERY NIGHT AT BEDTIME 90 tablet 2  . metFORMIN (GLUCOPHAGE) 1000 MG tablet TAKE 1 TABLET BY MOUTH TWICE DAILY WITH MEALS 180 tablet 0  . ONETOUCH DELICA LANCETS 52W MISC CHECK BLOOD SUGAR TWICE DAILY TO THREE TIMES DAILY 300 each 0  . ONETOUCH VERIO test strip USE TO TEST BLOOD SUGAR 2 TO 3 TIMES DAILY 250 each 1  . pioglitazone (ACTOS) 45 MG tablet TAKE 1 TABLET BY MOUTH EVERY NIGHT AT BEDTIME 30 tablet 0  . ranitidine (ZANTAC) 150 MG tablet Take 150 mg by mouth daily as needed for heartburn.    . rosuvastatin (CRESTOR) 40 MG tablet Take 1 tablet (40 mg total) by mouth daily. 90 tablet 3   No current facility-administered medications for this visit.     Functional Status:  In your present  state of health, do you have any difficulty performing the following activities: 08/17/2017  Hearing? Y  Vision? N  Difficulty concentrating or making decisions? Y  Walking or climbing stairs? N  Dressing or bathing? N  Doing errands, shopping? N  Some recent data might be hidden    Fall/Depression Screening: Fall Risk  08/17/2017 10/22/2016 05/11/2016  Falls in the past year? No No Yes  Number falls in past yr: - - 1  Injury with Fall? - - No   PHQ 2/9 Scores 08/17/2017 08/03/2017 07/26/2017 04/07/2017 10/22/2016 05/11/2016 05/16/2015  PHQ - 2 Score 2 3 0 4 0 0 3  PHQ- 9 Score 7 13 - 6 - 0 3     Assessment: Patient will benefit from health coach outreach for disease management and support.   THN CM Care Plan Problem One     Most Recent Value  Care Plan Problem One  knowledge deficit of diease management  Role Documenting the Problem One  [place] for Problem One  Active  THN Long Term Goal   In 90 days the patient will lower his a1c of 9.0 1-2 points.  THN Long Term Goal Start Date  08/17/17  Interventions for Problem One Long Term Goal  Reviewed cbg record,  discussed dietary intake,  reviewed and discussed medication management     Plan: [facility] will provide ongoing education for patient on diabetes through phone calls and sending printed information to patient for further discussion.  RN Health Coach will send welcome packet with consent to patient as well as printed information on diabetes.  RN Health Coach will send initial barriers letter, assessment, and care plan to primary care physician. RN Health Coach will contact patient in the month of September and patient agrees to next outreach.  Lazaro Arms RN, BSN, [place] Direct Dial:  [phone]  Fax: [phone]

## 2017-08-20 ENCOUNTER — Other Ambulatory Visit: Payer: Self-pay | Admitting: Family Medicine

## 2017-08-24 ENCOUNTER — Other Ambulatory Visit: Payer: Self-pay

## 2017-08-24 NOTE — Patient Outreach (Signed)
[facility]) Care Management  08/24/2017  [name] [name] [birth year] [id number]  Successful outreach attempt to [name] wife, [name].  HIPAA identifiers verified.   [name] states she will complete the patient assistance application for Trulicity tonight and get it in the mail to [place].  Plan: Will route note to CPhT, [name] who is working through the application process.  [name], PharmD Clinical Pharmacist [place] [phone]

## 2017-08-26 ENCOUNTER — Other Ambulatory Visit: Payer: Self-pay | Admitting: Physician Assistant

## 2017-08-27 NOTE — Telephone Encounter (Signed)
Last OV 08/03/2017 Last refill 07/07/2017 Ok to refill?

## 2017-08-30 DIAGNOSIS — X32XXXD Exposure to sunlight, subsequent encounter: Secondary | ICD-10-CM | POA: Diagnosis not present

## 2017-08-30 DIAGNOSIS — Z08 Encounter for follow-up examination after completed treatment for malignant neoplasm: Secondary | ICD-10-CM | POA: Diagnosis not present

## 2017-08-30 DIAGNOSIS — Z85828 Personal history of other malignant neoplasm of skin: Secondary | ICD-10-CM | POA: Diagnosis not present

## 2017-08-30 DIAGNOSIS — L570 Actinic keratosis: Secondary | ICD-10-CM | POA: Diagnosis not present

## 2017-09-01 ENCOUNTER — Other Ambulatory Visit: Payer: Self-pay | Admitting: Physician Assistant

## 2017-09-01 ENCOUNTER — Ambulatory Visit (INDEPENDENT_AMBULATORY_CARE_PROVIDER_SITE_OTHER): Payer: PPO

## 2017-09-01 ENCOUNTER — Ambulatory Visit (INDEPENDENT_AMBULATORY_CARE_PROVIDER_SITE_OTHER): Payer: PPO | Admitting: Orthopaedic Surgery

## 2017-09-01 ENCOUNTER — Encounter: Payer: Self-pay | Admitting: Orthopaedic Surgery

## 2017-09-01 VITALS — BP 123/66 | HR 87 | Ht 69.0 in | Wt 145.0 lb

## 2017-09-01 DIAGNOSIS — M5441 Lumbago with sciatica, right side: Secondary | ICD-10-CM

## 2017-09-01 DIAGNOSIS — G8929 Other chronic pain: Secondary | ICD-10-CM | POA: Diagnosis not present

## 2017-09-01 MED ORDER — HYDROCODONE-ACETAMINOPHEN 5-325 MG PO TABS: 1.0000 | ORAL_TABLET | Freq: Four times a day (QID) | ORAL | 0 refills | Status: DC | PRN
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
## 2017-09-04 ENCOUNTER — Other Ambulatory Visit: Payer: Self-pay | Admitting: Physician Assistant

## 2017-09-04 ENCOUNTER — Other Ambulatory Visit: Payer: Self-pay | Admitting: Family Medicine

## 2017-09-07 ENCOUNTER — Other Ambulatory Visit: Payer: Self-pay | Admitting: Family Medicine

## 2017-09-07 DIAGNOSIS — C44319 Basal cell carcinoma of skin of other parts of face: Secondary | ICD-10-CM | POA: Diagnosis not present

## 2017-09-08 ENCOUNTER — Other Ambulatory Visit: Payer: Self-pay | Admitting: Family Medicine

## 2017-09-09 ENCOUNTER — Ambulatory Visit (HOSPITAL_COMMUNITY): Payer: PPO

## 2017-09-14 ENCOUNTER — Ambulatory Visit: Payer: Self-pay | Admitting: Orthopaedic Surgery

## 2017-09-16 ENCOUNTER — Other Ambulatory Visit: Payer: Self-pay

## 2017-09-16 NOTE — Patient Outreach (Signed)
[place] [facility]) Care Management  09/16/2017   [name] [name] February 20, 1940 [id number]  Subjective: Telephone call to the patient for assessment. HIPAA verified by the wife ( on consent form).  Caregiver states that the patient has been doing well.  He has not had any falls.  He still has chronic pain in is hand and back that is rates ata 7/10.  He has an appointment to go have an MRI done on his back tomorrow.  She states that the patient's blood sugar this morning was 164.  It was a little higher than normal because the patient ate 2 brownies before bed. Reviewed food intake with the caregiver and she verbalized understanding.  The wife states that the patient tries to follow a healthy diet.  She states that he is adherent with his medications.  She states that she has mailed the form in to pharmacy yesterday for help with his Trulicity.  The patient has an appointment with his physician in October.  The caregiver was encouraged to receive their flu shots at the next office visit.   Current Medications:  Current Outpatient Medications  Medication Sig Dispense Refill  . ALPRAZolam (XANAX) 0.5 MG tablet TAKE 1 TABLET BY MOUTH THREE TIMES DAILY AS NEEDED 60 tablet 2  . Blood Glucose Monitoring Suppl (ONETOUCH VERIO) w/Device KIT USE TO TEST BLOOD SUGAR LEVELS THREE TIMES DAILY 1 kit 0  . dipyridamole-aspirin (AGGRENOX) 200-25 MG 12hr capsule TAKE 1 CAPSULE BY MOUTH TWICE DAILY 60 capsule 0  . Dulaglutide (TRULICITY) 6.71 IW/5.8KD SOPN Inject 0.75 mg into the skin once a week. 4 pen 3  . HYDROcodone-acetaminophen (NORCO/VICODIN) 5-325 MG tablet Take 1 tablet by mouth every 6 (six) hours as needed for moderate pain (Must last 30 days). 90 tablet 0  . Insulin Glargine (LANTUS SOLOSTAR) 100 UNIT/ML Solostar Pen ADMINISTER 15 UNITS UNDER THE SKIN DAILY 15 mL 0  . Insulin Pen Needle (PEN NEEDLES) 31G X 8 MM MISC 1 each by Does not apply route daily. 100 each 3  . losartan (COZAAR) 50 MG tablet  TAKE 1 TABLET BY MOUTH EVERY NIGHT AT BEDTIME 90 tablet 2  . metFORMIN (GLUCOPHAGE) 1000 MG tablet TAKE 1 TABLET BY MOUTH TWICE DAILY WITH MEALS 180 tablet 0  . ONETOUCH DELICA LANCETS 98P MISC CHECK BLOOD SUGAR TWICE DAILY TO THREE TIMES DAILY 300 each 0  . ONETOUCH DELICA LANCETS 38S MISC CHECK BLOOD SUGAR TWICE DAILY TO THREE TIMES DAILY 300 each 3  . ONETOUCH VERIO test strip USE TO TEST BLOOD SUGAR 2 TO 3 TIMES DAILY 250 each 1  . ONETOUCH VERIO test strip USE TO TEST BLOOD SUGAR TWICE DAILY TO THREE TIMES DAILY 200 each 3  . pioglitazone (ACTOS) 45 MG tablet TAKE 1 TABLET BY MOUTH EVERY NIGHT AT BEDTIME 30 tablet 3  . ranitidine (ZANTAC) 150 MG tablet Take 150 mg by mouth daily as needed for heartburn.    . rosuvastatin (CRESTOR) 40 MG tablet Take 1 tablet (40 mg total) by mouth daily. 90 tablet 3  . diclofenac (VOLTAREN) 75 MG EC tablet Take 75 mg by mouth 2 (two) times daily.    . fluticasone (FLONASE) 50 MCG/ACT nasal spray SHAKE WELL AND USE 2 SPRAYS IN EACH NOSTRIL DAILY (Patient not taking: Reported on 09/16/2017) 16 g 3   No current facility-administered medications for this visit.     Functional Status:  In your present state of health, do you have any difficulty performing the following activities: 08/17/2017  Hearing? Y  Vision? N  Difficulty concentrating or making decisions? Y  Walking or climbing stairs? N  Dressing or bathing? N  Doing errands, shopping? N  Some recent data might be hidden    Fall/Depression Screening: Fall Risk  09/16/2017 08/17/2017 10/22/2016  Falls in the past year? No No No  Number falls in past yr: - - -  Injury with Fall? - - -   PHQ 2/9 Scores 08/17/2017 08/03/2017 07/26/2017 04/07/2017 10/22/2016 05/11/2016 05/16/2015  PHQ - 2 Score 2 3 0 4 0 0 3  PHQ- 9 Score 7 13 - 6 - 0 3    Assessment: Patient will continue to benefit from health coach outreach for disease management and support.  THN CM Care Plan Problem One     Most Recent Value  THN Long  Term Goal   In 60 days the patient will lower his a1c of 9.0 1-2 points.  THN Long Term Goal Start Date  09/16/17  Interventions for Problem One Long Term Goal     Reviewed cbg record,  discussed dietary intake,  reviewed and discussed medication management     Plan: [place] will contact patient in the month of November and patient agrees to next outreach.  Lazaro Arms RN, BSN, [place] Direct Dial:  [phone]  Fax: [phone]

## 2017-09-17 ENCOUNTER — Ambulatory Visit (HOSPITAL_COMMUNITY)
Admission: RE | Admit: 2017-09-17 | Discharge: 2017-09-17 | Disposition: A | Discharge status: Home / Self Care | Payer: PPO | Source: Ambulatory Visit | Attending: Orthopaedic Surgery | Admitting: Orthopaedic Surgery

## 2017-09-17 ENCOUNTER — Ambulatory Visit: Payer: PPO

## 2017-09-17 DIAGNOSIS — M5126 Other intervertebral disc displacement, lumbar region: Secondary | ICD-10-CM | POA: Insufficient documentation

## 2017-09-17 DIAGNOSIS — M48061 Spinal stenosis, lumbar region without neurogenic claudication: Secondary | ICD-10-CM | POA: Diagnosis not present

## 2017-09-17 DIAGNOSIS — G8929 Other chronic pain: Secondary | ICD-10-CM

## 2017-09-17 DIAGNOSIS — M2578 Osteophyte, vertebrae: Secondary | ICD-10-CM | POA: Insufficient documentation

## 2017-09-17 DIAGNOSIS — M5441 Lumbago with sciatica, right side: Secondary | ICD-10-CM

## 2017-09-17 DIAGNOSIS — M549 Dorsalgia, unspecified: Secondary | ICD-10-CM | POA: Diagnosis present

## 2017-09-21 ENCOUNTER — Ambulatory Visit: Payer: PPO | Admitting: Orthopaedic Surgery

## 2017-09-21 ENCOUNTER — Encounter: Payer: Self-pay | Admitting: Orthopaedic Surgery

## 2017-09-21 VITALS — BP 112/63 | HR 76 | Ht 69.0 in | Wt 145.0 lb

## 2017-09-21 DIAGNOSIS — F17210 Nicotine dependence, cigarettes, uncomplicated: Secondary | ICD-10-CM

## 2017-09-21 DIAGNOSIS — M5441 Lumbago with sciatica, right side: Secondary | ICD-10-CM

## 2017-09-21 DIAGNOSIS — G8929 Other chronic pain: Secondary | ICD-10-CM | POA: Diagnosis not present

## 2017-09-21 NOTE — Patient Instructions (Signed)
Steps to Quit Smoking Smoking tobacco can be bad for your health. It can also affect almost every organ in your body. Smoking puts you and people around you at risk for many serious long-lasting (chronic) diseases. Quitting smoking is hard, but it is one of the best things that you can do for your health. It is never too late to quit. What are the benefits of quitting smoking? When you quit smoking, you lower your risk for getting serious diseases and conditions. They can include:  Lung cancer or lung disease.  Heart disease.  Stroke.  Heart attack.  Not being able to have children (infertility).  Weak bones (osteoporosis) and broken bones (fractures).  If you have coughing, wheezing, and shortness of breath, those symptoms may get better when you quit. You may also get sick less often. If you are pregnant, quitting smoking can help to lower your chances of having a baby of low birth weight. What can I do to help me quit smoking? Talk with your doctor about what can help you quit smoking. Some things you can do (strategies) include:  Quitting smoking totally, instead of slowly cutting back how much you smoke over a period of time.  Going to in-person counseling. You are more likely to quit if you go to many counseling sessions.  Using resources and support systems, such as: ? Online chats with a counselor. ? Phone quitlines. ? Printed self-help materials. ? Support groups or group counseling. ? Text messaging programs. ? Mobile phone apps or applications.  Taking medicines. Some of these medicines may have nicotine in them. If you are pregnant or breastfeeding, do not take any medicines to quit smoking unless your doctor says it is okay. Talk with your doctor about counseling or other things that can help you.  Talk with your doctor about using more than one strategy at the same time, such as taking medicines while you are also going to in-person counseling. This can help make  quitting easier. What things can I do to make it easier to quit? Quitting smoking might feel very hard at first, but there is a lot that you can do to make it easier. Take these steps:  Talk to your family and friends. Ask them to support and encourage you.  Call phone quitlines, reach out to support groups, or work with a counselor.  Ask people who smoke to not smoke around you.  Avoid places that make you want (trigger) to smoke, such as: ? Bars. ? Parties. ? Smoke-break areas at work.  Spend time with people who do not smoke.  Lower the stress in your life. Stress can make you want to smoke. Try these things to help your stress: ? Getting regular exercise. ? Deep-breathing exercises. ? Yoga. ? Meditating. ? Doing a body scan. To do this, close your eyes, focus on one area of your body at a time from head to toe, and notice which parts of your body are tense. Try to relax the muscles in those areas.  Download or buy apps on your mobile phone or tablet that can help you stick to your quit plan. There are many free apps, such as QuitGuide from the CDC (Centers for Disease Control and Prevention). You can find more support from smokefree.gov and other websites.  This information is not intended to replace advice given to you by your health care provider. Make sure you discuss any questions you have with your health care provider. Document Released: 10/25/2008 Document   Revised: 08/27/2015 Document Reviewed: 05/15/2014 Elsevier Interactive Patient Education  2018 [employer]  
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
## 2017-09-22 ENCOUNTER — Telehealth: Payer: Self-pay | Admitting: Family Medicine

## 2017-09-22 NOTE — Telephone Encounter (Signed)
Patients wife calling wanting to speak with you, did not leave a specific reason on the voicemail

## 2017-09-23 ENCOUNTER — Other Ambulatory Visit: Payer: Self-pay | Admitting: Family Medicine

## 2017-09-23 NOTE — Telephone Encounter (Signed)
Spoke to wife and he needs samples of Trulicity - samples left in fridge

## 2017-09-30 ENCOUNTER — Telehealth: Payer: Self-pay | Admitting: Orthopaedic Surgery

## 2017-09-30 MED ORDER — HYDROCODONE-ACETAMINOPHEN 5-325 MG PO TABS: 1.0000 | ORAL_TABLET | Freq: Four times a day (QID) | ORAL | 0 refills | Status: DC | PRN

## 2017-09-30 NOTE — Telephone Encounter (Signed)
Patient requests refill:  HYDROcodone-acetaminophen (NORCO/VICODIN) 5-325 MG tablet 90 tablet   [employer], [place]

## 2017-10-05 DIAGNOSIS — C44319 Basal cell carcinoma of skin of other parts of face: Secondary | ICD-10-CM | POA: Diagnosis not present

## 2017-10-12 ENCOUNTER — Other Ambulatory Visit: Payer: Self-pay

## 2017-10-12 NOTE — Patient Outreach (Signed)
[place] [facility]) Care Management  10/12/2017  LAREN WHALING [birth year] [id number]  Unsuccessful outreach attempt to Mr. Degrazia  Left HIPAA compliant voice message requesting a return call.  Plan: Outreach attempt in 3-4 business days to follow up on patient assistance application for Trulicity.  [name], PharmD Clinical Pharmacist [place] [phone]

## 2017-10-15 ENCOUNTER — Other Ambulatory Visit: Payer: Self-pay

## 2017-10-15 ENCOUNTER — Ambulatory Visit: Payer: Self-pay

## 2017-10-15 NOTE — Patient Outreach (Addendum)
[place] [facility]) Care Management  10/15/2017  [name] [name] 1940/04/28 [id number]   Unsuccessful outreach attempt #2 to [name]  Left HIPAA compliant voice message requesting a return call.  Plan: Outreach attempt next week to follow up on patient assistance application for Trulicity.  [name], PharmD Clinical Pharmacist [place] [phone]

## 2017-10-16 IMAGING — DX DG LUMBAR SPINE COMPLETE 4+V
5 series · 5 of 5 positions shown · non-contrast
Comparison: None.

CLINICAL DATA: Back pain down RIGHT leg to his RIGHT foot.

EXAM:
LUMBAR SPINE - COMPLETE 4+ VIEW

[l-spine ap]
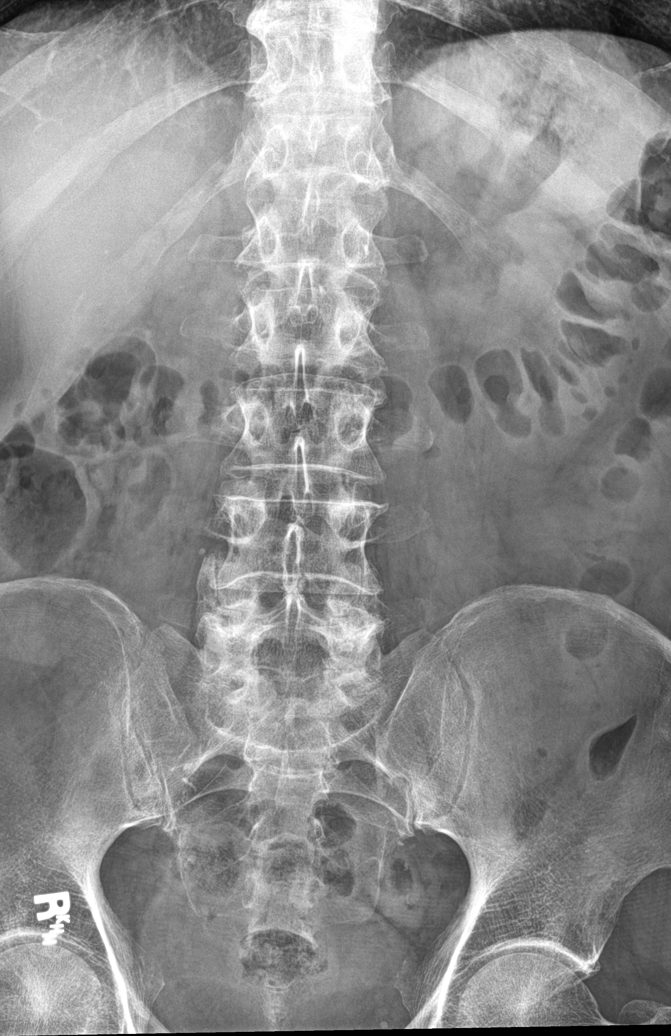

[l-spine obl (1 of 2)]
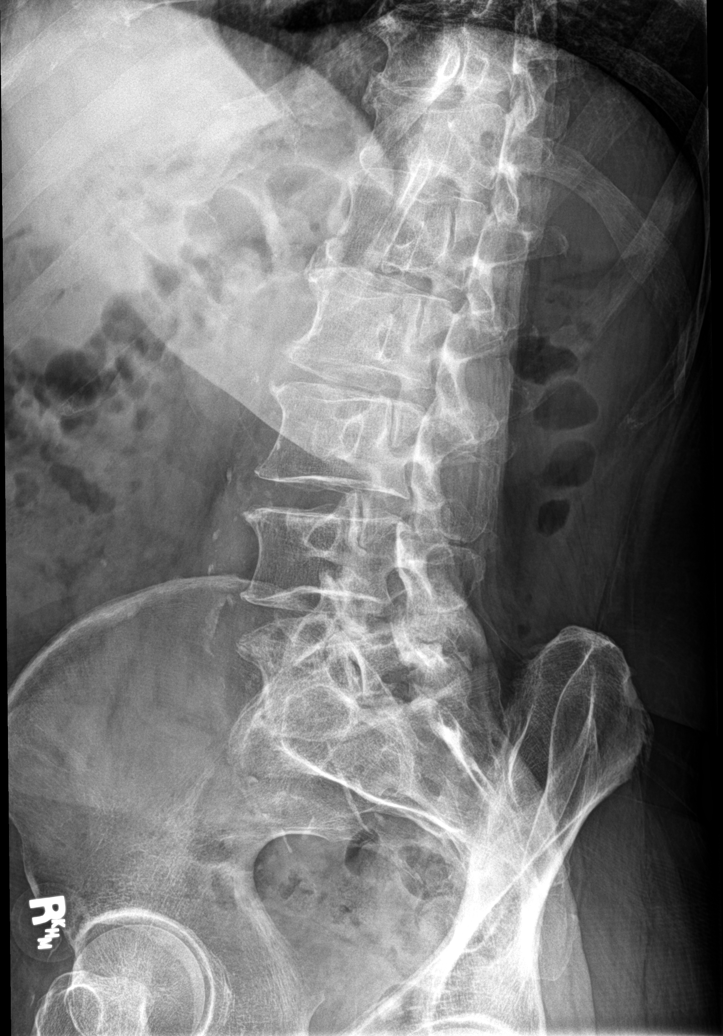

[l-spine obl (2 of 2)]
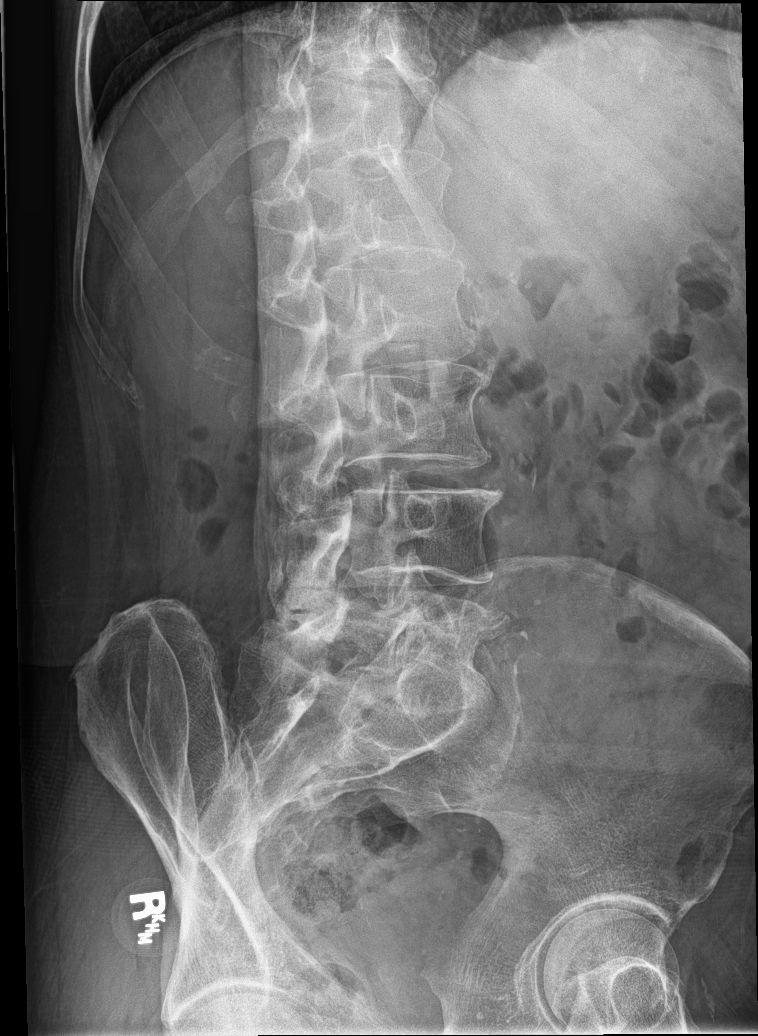

[l-spine lat]
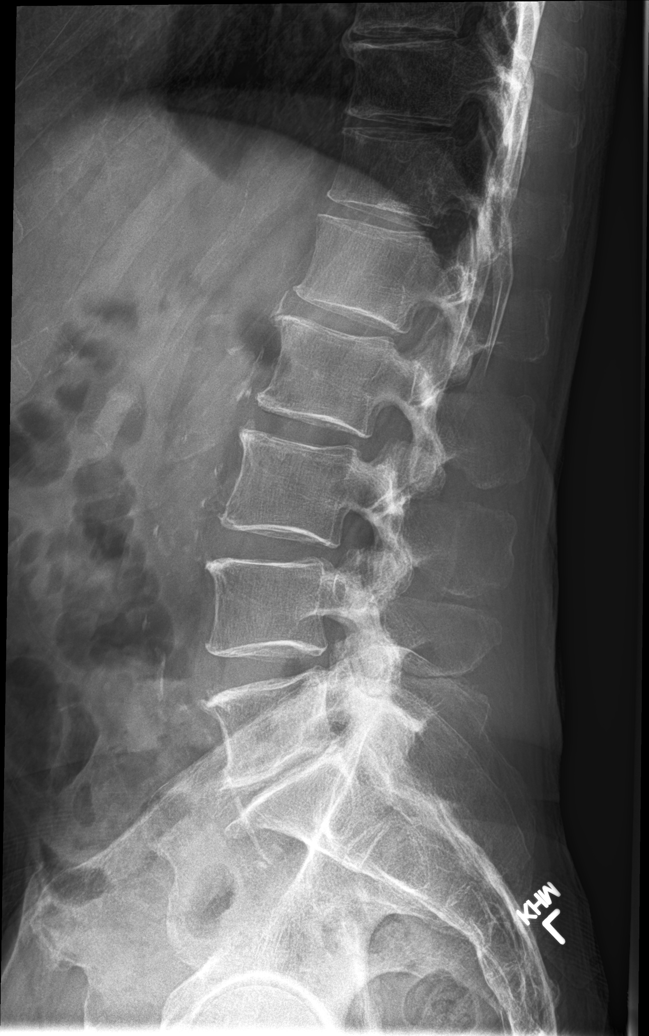

[l-spine spot]
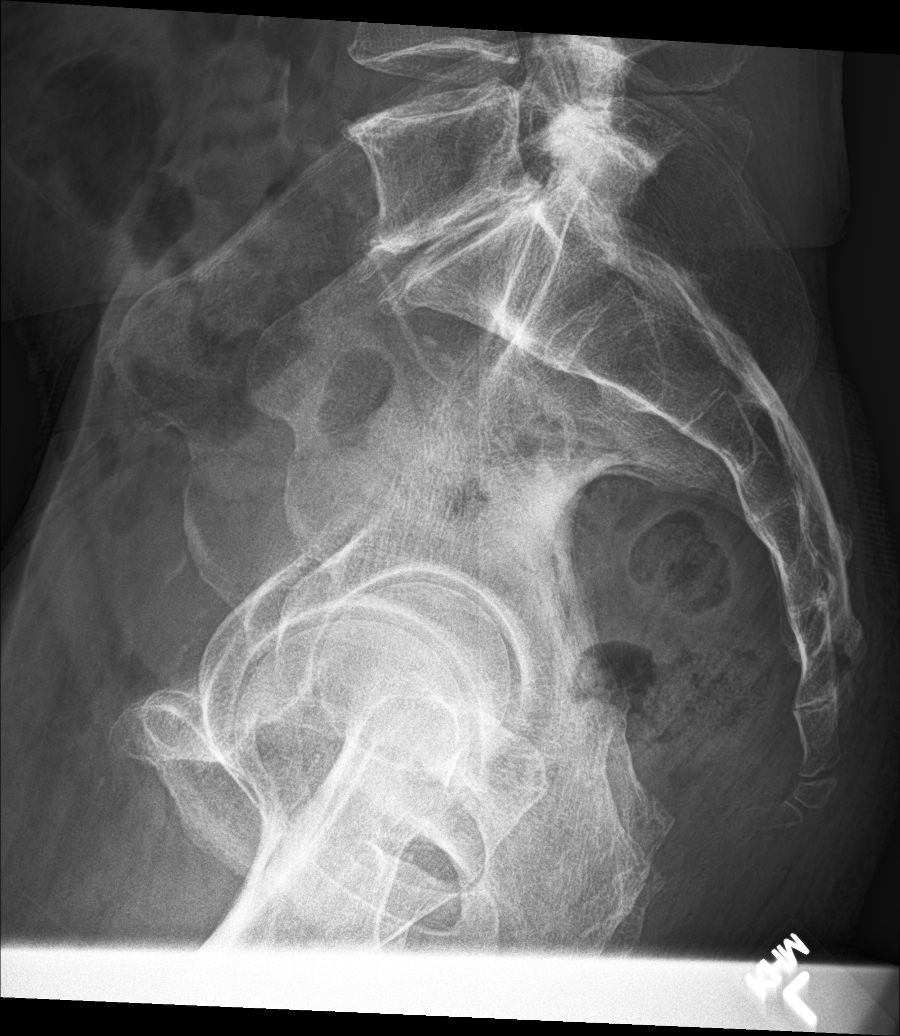

[5 of 5 positions shown; findings below may reference images not displayed]

FINDINGS: Joint space narrowing at L5-S1. Normal alignment of vertebral
bodies. No loss vertebral body height and disc height. No pars
fracture.
IMPRESSION: No acute findings lumbar spine.  No evidence of trauma.

## 2017-10-22 ENCOUNTER — Other Ambulatory Visit: Payer: Self-pay

## 2017-10-22 ENCOUNTER — Ambulatory Visit: Payer: Self-pay

## 2017-10-22 NOTE — Patient Outreach (Signed)
[place] [facility]) Care Management  10/22/2017  [name] [name] 1940/12/24 [id number]  Successful outreach to [name] [name]. HIPAA identifiers verified.    [name] [name] reports that she mailed the patient assistance application back to [place] about a week and a half ago.  Plan: Will route note to [place], [name].  [name], PharmD Clinical Pharmacist [place] [phone]

## 2017-10-28 ENCOUNTER — Ambulatory Visit: Payer: PPO | Admitting: Orthopaedic Surgery

## 2017-10-28 ENCOUNTER — Encounter: Payer: Self-pay | Admitting: Orthopaedic Surgery

## 2017-10-28 VITALS — BP 103/61 | HR 77 | Ht 69.0 in | Wt 143.0 lb

## 2017-10-28 DIAGNOSIS — M5441 Lumbago with sciatica, right side: Secondary | ICD-10-CM

## 2017-10-28 DIAGNOSIS — G8929 Other chronic pain: Secondary | ICD-10-CM

## 2017-10-28 DIAGNOSIS — F17210 Nicotine dependence, cigarettes, uncomplicated: Secondary | ICD-10-CM | POA: Diagnosis not present

## 2017-10-28 MED ORDER — HYDROCODONE-ACETAMINOPHEN 5-325 MG PO TABS: 1.0000 | ORAL_TABLET | Freq: Four times a day (QID) | ORAL | 0 refills | Status: DC | PRN
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
## 2017-11-02 ENCOUNTER — Ambulatory Visit: Payer: PPO | Admitting: Orthopaedic Surgery

## 2017-11-05 ENCOUNTER — Other Ambulatory Visit: Payer: Self-pay

## 2017-11-05 NOTE — Patient Outreach (Signed)
[name] [name] [facility]) Care Management  [name] [name]  11/05/2017  [name] 06-12-1940 [id number]   Unsuccessful telephone call attempt # 1 to patient to give an update about [name] patient assistance application.   HIPAA compliant voicemail left requesting a return call  Plan:  I will make another outreach attempt to patient within 3-4 business days.  [name], PharmD Clinical Pharmacist [place] [phone]

## 2017-11-11 ENCOUNTER — Other Ambulatory Visit: Payer: Self-pay | Admitting: Pharmacy Technician

## 2017-11-11 ENCOUNTER — Ambulatory Visit: Payer: Self-pay

## 2017-11-11 ENCOUNTER — Other Ambulatory Visit: Payer: Self-pay

## 2017-11-11 NOTE — Patient Outreach (Signed)
[facility]) Care Management  11/11/2017  NINO AMANO [birth year] [id number]   Received request from [place] to [place] patient assistance application to patient. Prepared application as well as Letter of hardship to be mailed to patient.  Will follow up with patient in 5-7 business days to confirm application has been received.  Maud Deed Beola Cord Certified Pharmacy Technician [facility] Care Management [phone]

## 2017-11-11 NOTE — Patient Outreach (Signed)
[facility]) Care Management  11/11/2017  [name] [name] October 12, 1940 [id number]  Successful outreach to [name] wife, [name].  HIPAA identifiers verified.  Medication Assistance: Informed [name] that we have not received [name] that she mailed to [place].  She states that it was not returned to their address.  [name] requests that we send her a new application and she will get it returned quickly.    Plan: Route letter to [name], [name] requesting she send a new application.  [name], PharmD Clinical Pharmacist [place] [phone]

## 2017-11-18 ENCOUNTER — Other Ambulatory Visit: Payer: Self-pay

## 2017-11-18 NOTE — Patient Outreach (Signed)
[facility]) Care Management  11/18/2017   [name] [name] [birth year] [id number]  Subjective: Successful telephone call to the patient for assessment. HIPAA verified by [name] ( on ROI).   Caregiver states that the patient has been doing fine.  He still has chronic pain in his right hand and hip that he rates at a seven. Caregiver states that the patient has not had any falls.  His blood sugar this morning was 124.  Caregiver states that he is watching his diet.  He is taking his medications as prescribed.  The caregiver states that the patient has not received his flu shot yet but he will get it at his next appointment with his PCP on the 26th of this month.   Current Medications:  Current Outpatient Medications  Medication Sig Dispense Refill  . ALPRAZolam (XANAX) 0.5 MG tablet TAKE 1 TABLET BY MOUTH THREE TIMES DAILY AS NEEDED 60 tablet 2  . Blood Glucose Monitoring Suppl (ONETOUCH VERIO) w/Device KIT USE TO TEST BLOOD SUGAR LEVELS THREE TIMES DAILY 1 kit 0  . diclofenac (VOLTAREN) 75 MG EC tablet Take 75 mg by mouth 2 (two) times daily.    Marland Kitchen dipyridamole-aspirin (AGGRENOX) 200-25 MG 12hr capsule TAKE 1 CAPSULE BY MOUTH TWICE DAILY 180 capsule 3  . Dulaglutide (TRULICITY) 1.00 FH/2.1FX SOPN Inject 0.75 mg into the skin once a week. 4 pen 3  . HYDROcodone-acetaminophen (NORCO/VICODIN) 5-325 MG tablet Take 1 tablet by mouth every 6 (six) hours as needed for moderate pain (Must last 30 days). 90 tablet 0  . Insulin Glargine (LANTUS SOLOSTAR) 100 UNIT/ML Solostar Pen ADMINISTER 15 UNITS UNDER THE SKIN DAILY 15 mL 0  . Insulin Pen Needle (PEN NEEDLES) 31G X 8 MM MISC 1 each by Does not apply route daily. 100 each 3  . losartan (COZAAR) 50 MG tablet TAKE 1 TABLET BY MOUTH EVERY NIGHT AT BEDTIME 90 tablet 2  . metFORMIN (GLUCOPHAGE) 1000 MG tablet TAKE 1 TABLET BY MOUTH TWICE DAILY WITH MEALS 180 tablet 0  . ONETOUCH DELICA LANCETS 58I MISC CHECK BLOOD SUGAR TWICE DAILY TO THREE  TIMES DAILY 300 each 0  . ONETOUCH DELICA LANCETS 32P MISC CHECK BLOOD SUGAR TWICE DAILY TO THREE TIMES DAILY 300 each 3  . ONETOUCH VERIO test strip USE TO TEST BLOOD SUGAR 2 TO 3 TIMES DAILY 250 each 1  . ONETOUCH VERIO test strip USE TO TEST BLOOD SUGAR TWICE DAILY TO THREE TIMES DAILY 200 each 3  . pioglitazone (ACTOS) 45 MG tablet TAKE 1 TABLET BY MOUTH EVERY NIGHT AT BEDTIME 30 tablet 3  . ranitidine (ZANTAC) 150 MG tablet Take 150 mg by mouth daily as needed for heartburn.    . rosuvastatin (CRESTOR) 40 MG tablet Take 1 tablet (40 mg total) by mouth daily. 90 tablet 3  . fluticasone (FLONASE) 50 MCG/ACT nasal spray SHAKE WELL AND USE 2 SPRAYS IN EACH NOSTRIL DAILY (Patient not taking: Reported on 09/16/2017) 16 g 3   No current facility-administered medications for this visit.     Functional Status:  In your present state of health, do you have any difficulty performing the following activities: 08/17/2017  Hearing? Y  Vision? N  Difficulty concentrating or making decisions? Y  Walking or climbing stairs? N  Dressing or bathing? N  Doing errands, shopping? N  Some recent data might be hidden    Fall/Depression Screening: Fall Risk  11/18/2017 09/16/2017 08/17/2017  Falls in the past year? 0 No No  Number  falls in past yr: - - -  Injury with Fall? - - -   PHQ 2/9 Scores 08/17/2017 08/03/2017 07/26/2017 04/07/2017 10/22/2016 05/11/2016 05/16/2015  PHQ - 2 Score 2 3 0 4 0 0 3  PHQ- 9 Score 7 13 - 6 - 0 3    Assessment: Patient will continue to benefit from health coach outreach for disease management and support.   THN CM Care Plan Problem One     Most Recent Value  THN Long Term Goal   In 30 days the patient will lower his a1c of 9.0 1-2 points.  THN Long Term Goal Start Date  11/18/17  Interventions for Problem One Long Term Goal  Discussed diet ,cbg reading and medication adherence.      Plan: RN Health Coach will contact patient in the month of December and patient agrees to next  outreach.  Lazaro Arms RN, BSN, [place] Direct Dial:  [phone]  Fax: [phone]

## 2017-11-19 ENCOUNTER — Other Ambulatory Visit: Payer: Self-pay | Admitting: Pharmacy Technician

## 2017-11-22 ENCOUNTER — Ambulatory Visit: Payer: Self-pay | Admitting: Pharmacy Technician

## 2017-11-23 ENCOUNTER — Other Ambulatory Visit: Payer: Self-pay | Admitting: Pharmacy Technician

## 2017-11-23 NOTE — Patient Outreach (Signed)
[facility]) Care Management  11/23/2017  [name] [name] November 29, 1940 [id number]   Successful outreach call to patients wife, HIPAA identifiers verified. [name] confirms that she received that patient assistance application that was mailed out to them. She states that she will try to fill out application tonight and get it back in the mail.  Will follow up with patient in 10-14 days if documents have not been returned.  Maud Deed Chana Bode [place] Certified Pharmacy Technician [place] Management Direct Dial:[phone]

## 2017-11-24 ENCOUNTER — Other Ambulatory Visit: Payer: Self-pay | Admitting: Physician Assistant

## 2017-11-25 ENCOUNTER — Telehealth: Payer: Self-pay | Admitting: Orthopedic Surgery

## 2017-11-25 MED ORDER — HYDROCODONE-ACETAMINOPHEN 5-325 MG PO TABS: 1.0000 | ORAL_TABLET | Freq: Four times a day (QID) | ORAL | 0 refills | Status: DC | PRN

## 2017-11-25 NOTE — Telephone Encounter (Signed)
Patient called for refill:  HYDROcodone-acetaminophen (NORCO/VICODIN) 5-325 MG tablet 90 tablet  - [place] - aware of appt in December.

## 2017-11-29 ENCOUNTER — Other Ambulatory Visit: Payer: Self-pay | Admitting: Physician Assistant

## 2017-12-16 ENCOUNTER — Ambulatory Visit: Payer: Self-pay | Admitting: Pharmacy Technician

## 2017-12-18 ENCOUNTER — Other Ambulatory Visit: Payer: Self-pay | Admitting: Family Medicine

## 2017-12-20 ENCOUNTER — Other Ambulatory Visit: Payer: Self-pay

## 2017-12-20 ENCOUNTER — Other Ambulatory Visit: Payer: Self-pay | Admitting: Pharmacy Technician
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
## 2017-12-20 NOTE — Patient Outreach (Signed)
[place] [facility]) Care Management  12/20/2017  [name] [name] [birth year] [id number]    Successful call placed to patient's wife [name] regarding patient assistance application, HIPAA identifiers verified. Mrs. Westberg states that the application is still "laying on the table" and that she hopes to mail it back in today. Informed her that when I receive it back I will hold on to it and submit it for 2020, she stated that would be fine. Confirmed she has my contact information.  Will route note to [name] for case closure due to numerous calls and multiple mailing of application.  Maud Deed Chana Bode [place] Certified Pharmacy Technician [place] Management Direct Dial:[phone]

## 2017-12-20 NOTE — Patient Outreach (Addendum)
[facility]) Care Management [place]  12/20/2017  ELNATHAN FULFORD 24-Oct-1940 [id number]  Reason for referral: medication assistance  [facility] pharmacy case is being closed due to the following reasons:  [place] has made numerous calls to Mr. Finigan and mailed duplicate patient assistance applications and he has not completed the patient portion to date.   Patient has been provided [facility] CM contact information if assistance needed in the future.    Thank you for allowing [facility] pharmacy to be involved in this patient's care.    Plan: Route discipline closure letter to PCP, Dr. [name].  [name], PharmD Clinical Pharmacist [place] [phone]

## 2017-12-24 DIAGNOSIS — E119 Type 2 diabetes mellitus without complications: Secondary | ICD-10-CM | POA: Diagnosis not present

## 2017-12-28 ENCOUNTER — Ambulatory Visit: Payer: PPO | Admitting: Orthopaedic Surgery

## 2017-12-28 ENCOUNTER — Encounter: Payer: Self-pay | Admitting: Orthopaedic Surgery

## 2017-12-28 VITALS — BP 122/70 | HR 78 | Ht 68.0 in | Wt 148.0 lb

## 2017-12-28 DIAGNOSIS — M5441 Lumbago with sciatica, right side: Secondary | ICD-10-CM | POA: Diagnosis not present

## 2017-12-28 DIAGNOSIS — G8929 Other chronic pain: Secondary | ICD-10-CM | POA: Diagnosis not present

## 2017-12-28 DIAGNOSIS — F17210 Nicotine dependence, cigarettes, uncomplicated: Secondary | ICD-10-CM | POA: Diagnosis not present

## 2017-12-28 MED ORDER — HYDROCODONE-ACETAMINOPHEN 5-325 MG PO TABS: 1.0000 | ORAL_TABLET | Freq: Four times a day (QID) | ORAL | 0 refills | Status: DC | PRN

## 2017-12-28 NOTE — Patient Instructions (Signed)
Steps to Quit Smoking Smoking tobacco can be bad for your health. It can also affect almost every organ in your body. Smoking puts you and people around you at risk for many serious long-lasting (chronic) diseases. Quitting smoking is hard, but it is one of the best things that you can do for your health. It is never too late to quit. What are the benefits of quitting smoking? When you quit smoking, you lower your risk for getting serious diseases and conditions. They can include:  Lung cancer or lung disease.  Heart disease.  Stroke.  Heart attack.  Not being able to have children (infertility).  Weak bones (osteoporosis) and broken bones (fractures).  If you have coughing, wheezing, and shortness of breath, those symptoms may get better when you quit. You may also get sick less often. If you are pregnant, quitting smoking can help to lower your chances of having a baby of low birth weight. What can I do to help me quit smoking? Talk with your doctor about what can help you quit smoking. Some things you can do (strategies) include:  Quitting smoking totally, instead of slowly cutting back how much you smoke over a period of time.  Going to in-person counseling. You are more likely to quit if you go to many counseling sessions.  Using resources and support systems, such as: ? Online chats with a counselor. ? Phone quitlines. ? Printed self-help materials. ? Support groups or group counseling. ? Text messaging programs. ? Mobile phone apps or applications.  Taking medicines. Some of these medicines may have nicotine in them. If you are pregnant or breastfeeding, do not take any medicines to quit smoking unless your doctor says it is okay. Talk with your doctor about counseling or other things that can help you.  Talk with your doctor about using more than one strategy at the same time, such as taking medicines while you are also going to in-person counseling. This can help make  quitting easier. What things can I do to make it easier to quit? Quitting smoking might feel very hard at first, but there is a lot that you can do to make it easier. Take these steps:  Talk to your family and friends. Ask them to support and encourage you.  Call phone quitlines, reach out to support groups, or work with a counselor.  Ask people who smoke to not smoke around you.  Avoid places that make you want (trigger) to smoke, such as: ? Bars. ? Parties. ? Smoke-break areas at work.  Spend time with people who do not smoke.  Lower the stress in your life. Stress can make you want to smoke. Try these things to help your stress: ? Getting regular exercise. ? Deep-breathing exercises. ? Yoga. ? Meditating. ? Doing a body scan. To do this, close your eyes, focus on one area of your body at a time from head to toe, and notice which parts of your body are tense. Try to relax the muscles in those areas.  Download or buy apps on your mobile phone or tablet that can help you stick to your quit plan. There are many free apps, such as QuitGuide from the CDC (Centers for Disease Control and Prevention). You can find more support from smokefree.gov and other websites.  This information is not intended to replace advice given to you by your health care provider. Make sure you discuss any questions you have with your health care provider. Document Released: 10/25/2008 Document   Revised: 08/27/2015 Document Reviewed: 05/15/2014 Elsevier Interactive Patient Education  2018 [employer]  
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
## 2017-12-29 ENCOUNTER — Other Ambulatory Visit: Payer: Self-pay | Admitting: Family Medicine

## 2017-12-30 NOTE — Telephone Encounter (Signed)
Pt is requesting refill on Xanax   LOV: 08/03/17  LRF:   11/30/17

## 2018-01-17 ENCOUNTER — Other Ambulatory Visit: Payer: Self-pay | Admitting: Family Medicine

## 2018-01-24 ENCOUNTER — Other Ambulatory Visit: Payer: Self-pay | Admitting: Family Medicine

## 2018-01-24 ENCOUNTER — Encounter: Payer: Self-pay | Admitting: Family Medicine

## 2018-01-24 ENCOUNTER — Ambulatory Visit (INDEPENDENT_AMBULATORY_CARE_PROVIDER_SITE_OTHER): Payer: PPO | Admitting: Family Medicine

## 2018-01-24 VITALS — BP 136/84 | HR 88 | Temp 97.9°F | Resp 18 | Ht 68.0 in | Wt 149.0 lb

## 2018-01-24 DIAGNOSIS — E1165 Type 2 diabetes mellitus with hyperglycemia: Secondary | ICD-10-CM | POA: Diagnosis not present

## 2018-01-24 DIAGNOSIS — I639 Cerebral infarction, unspecified: Secondary | ICD-10-CM | POA: Diagnosis not present

## 2018-01-24 DIAGNOSIS — F0280 Dementia in other diseases classified elsewhere without behavioral disturbance: Secondary | ICD-10-CM

## 2018-01-24 DIAGNOSIS — E118 Type 2 diabetes mellitus with unspecified complications: Secondary | ICD-10-CM | POA: Diagnosis not present

## 2018-01-24 DIAGNOSIS — IMO0002 Reserved for concepts with insufficient information to code with codable children: Secondary | ICD-10-CM

## 2018-01-24 DIAGNOSIS — Z23 Encounter for immunization: Secondary | ICD-10-CM | POA: Diagnosis not present

## 2018-01-24 MED ORDER — DONEPEZIL HCL 5 MG PO TABS: 5.0000 mg | ORAL_TABLET | Freq: Every day | ORAL | 0 refills | Status: DC

## 2018-01-24 NOTE — Addendum Note (Signed)
Addended by: Shary Decamp B on: 01/24/2018 05:08 PM   Modules accepted: Orders
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
## 2018-01-25 LAB — COMPLETE METABOLIC PANEL WITH GFR
AG Ratio: 1.7 (calc) (ref 1.0–2.5)
ALT: 12 U/L (ref 9–46)
AST: 15 U/L (ref 10–35)
Albumin: 3.9 g/dL (ref 3.6–5.1)
Alkaline phosphatase (APISO): 47 U/L (ref 40–115)
BUN: 16 mg/dL (ref 7–25)
CHLORIDE: 101 mmol/L (ref 98–110)
CO2: 27 mmol/L (ref 20–32)
Calcium: 9.8 mg/dL (ref 8.6–10.3)
Creat: 0.83 mg/dL (ref 0.70–1.18)
GFR, Est African American: 99 mL/min/{1.73_m2} (ref >=60)
GFR, Est Non African American: 85 mL/min/{1.73_m2} (ref >=60)
Globulin: 2.3 g/dL (calc) (ref 1.9–3.7)
Glucose, Bld: 217 mg/dL — ABNORMAL HIGH (ref 65–99)
Potassium: 4 mmol/L (ref 3.5–5.3)
SODIUM: 139 mmol/L (ref 135–146)
Total Bilirubin: 0.3 mg/dL (ref 0.2–1.2)
Total Protein: 6.2 g/dL (ref 6.1–8.1)

## 2018-01-25 LAB — CBC WITH DIFFERENTIAL/PLATELET
Absolute Monocytes: 651 cells/uL (ref 200–950)
Basophils Absolute: 95 cells/uL (ref 0–200)
Basophils Relative: 0.9 %
EOS PCT: 2.1 %
Eosinophils Absolute: 221 cells/uL (ref 15–500)
HEMATOCRIT: 38.8 % (ref 38.5–50.0)
Hemoglobin: 12.9 g/dL — ABNORMAL LOW (ref 13.2–17.1)
LYMPHS ABS: 4190 {cells}/uL — AB (ref 850–3900)
MCH: 31.2 pg (ref 27.0–33.0)
MCHC: 33.2 g/dL (ref 32.0–36.0)
MCV: 93.9 fL (ref 80.0–100.0)
MPV: 11.1 fL (ref 7.5–12.5)
Monocytes Relative: 6.2 %
Neutro Abs: 5345 cells/uL (ref 1500–7800)
Neutrophils Relative %: 50.9 %
Platelets: 250 10*3/uL (ref 140–400)
RBC: 4.13 10*6/uL — ABNORMAL LOW (ref 4.20–5.80)
RDW: 12.6 % (ref 11.0–15.0)
Total Lymphocyte: 39.9 %
WBC: 10.5 10*3/uL (ref 3.8–10.8)

## 2018-01-25 LAB — HEMOGLOBIN A1C
Hgb A1c MFr Bld: 9.5 % of total Hgb — ABNORMAL HIGH (ref <5.7)
Mean Plasma Glucose: 226 (calc)
eAG (mmol/L): 12.5 (calc)

## 2018-01-27 ENCOUNTER — Telehealth: Payer: Self-pay | Admitting: Orthopaedic Surgery

## 2018-01-27 MED ORDER — HYDROCODONE-ACETAMINOPHEN 5-325 MG PO TABS: 1.0000 | ORAL_TABLET | Freq: Four times a day (QID) | ORAL | 0 refills | Status: DC | PRN

## 2018-01-27 NOTE — Telephone Encounter (Signed)
Patient states he needs a refill on Hydrocodone/Acetaminophen 5-325 mgs.  Qty 90  Sig: Take 1 tablet by mouth every 6 (six) hours as needed for moderate pain (Must last 30 days).  Patient states he uses [facility] on [address]

## 2018-02-07 ENCOUNTER — Other Ambulatory Visit: Payer: Self-pay

## 2018-02-07 NOTE — Patient Outreach (Signed)
[facility]) Care Management  02/07/2018   [name] [name] [birth year] [id number]  Subjective: Successful outreach to the patient for assessment. HIPAA verified by [name] (wife/caregiver on ROI). Caregiver states that the patient is doing better.  He went to have his a1c checked 1/13 and it was 9.5.  It went up from 9.3 in June.  Caregiver states that the patients dementia has gotten worse and he was not taking his shots.  Caregiver states that they have gotten things taken care of and she will be giving him his shot and handling medications.  She states that his blood sugar this morning was 87.    She states that he is eating regulary and now taking his medications as prescribed. Discussed about the importance of taking his medications and she verbalized understand.  He still has pain in his hand and back that he rates at a 9/10.  She denies any falls.  The patients next appointment for follow up on his diabetes will be in April.    Current Medications:  Current Outpatient Medications  Medication Sig Dispense Refill  . ALPRAZolam (XANAX) 0.5 MG tablet TAKE 1 TABLET BY MOUTH THREE TIMES DAILY AS NEEDED 60 tablet 2  . Blood Glucose Monitoring Suppl (ONETOUCH VERIO) w/Device KIT USE TO TEST BLOOD SUGAR LEVELS THREE TIMES DAILY 1 kit 0  . donepezil (ARICEPT) 5 MG tablet TAKE 1 TABLET(5 MG) BY MOUTH AT BEDTIME 90 tablet 0  . Dulaglutide (TRULICITY) 3.82 NK/5.3ZJ SOPN Inject 0.75 mg into the skin once a week. 4 pen 3  . fluticasone (FLONASE) 50 MCG/ACT nasal spray SHAKE WELL AND USE 2 SPRAYS IN EACH NOSTRIL DAILY 16 g 3  . HYDROcodone-acetaminophen (NORCO/VICODIN) 5-325 MG tablet Take 1 tablet by mouth every 6 (six) hours as needed for moderate pain (Must last 30 days). 90 tablet 0  . Insulin Glargine (LANTUS SOLOSTAR) 100 UNIT/ML Solostar Pen ADMINISTER 15 UNITS UNDER THE SKIN DAILY 15 mL 1  . Insulin Pen Needle (PEN NEEDLES) 31G X 8 MM MISC 1 each by Does not apply route daily. 100  each 3  . losartan (COZAAR) 50 MG tablet TAKE 1 TABLET BY MOUTH EVERY NIGHT AT BEDTIME 90 tablet 2  . metFORMIN (GLUCOPHAGE) 1000 MG tablet TAKE 1 TABLET BY MOUTH TWICE DAILY WITH MEALS 180 tablet 0  . ONETOUCH DELICA LANCETS 67H MISC CHECK BLOOD SUGAR TWICE DAILY TO THREE TIMES DAILY 300 each 0  . ONETOUCH VERIO test strip USE TO TEST BLOOD SUGAR 2 TO 3 TIMES DAILY 250 each 1  . pioglitazone (ACTOS) 45 MG tablet TAKE 1 TABLET BY MOUTH EVERY NIGHT AT BEDTIME 30 tablet 3  . ranitidine (ZANTAC) 150 MG tablet Take 150 mg by mouth daily as needed for heartburn.    . rosuvastatin (CRESTOR) 40 MG tablet Take 1 tablet (40 mg total) by mouth daily. 90 tablet 3  . diclofenac (VOLTAREN) 75 MG EC tablet Take 75 mg by mouth 2 (two) times daily.    Marland Kitchen dipyridamole-aspirin (AGGRENOX) 200-25 MG 12hr capsule TAKE 1 CAPSULE BY MOUTH TWICE DAILY (Patient not taking: Reported on 01/24/2018) 180 capsule 3   No current facility-administered medications for this visit.     Functional Status:  In your present state of health, do you have any difficulty performing the following activities: 08/17/2017  Hearing? Y  Vision? N  Difficulty concentrating or making decisions? Y  Walking or climbing stairs? N  Dressing or bathing? N  Doing errands, shopping? N  Some  recent data might be hidden    Fall/Depression Screening: Fall Risk  02/07/2018 12/20/2017 11/18/2017  Falls in the past year? 0 0 0  Number falls in past yr: - - -  Injury with Fall? - - -   PHQ 2/9 Scores 08/17/2017 08/03/2017 07/26/2017 04/07/2017 10/22/2016 05/11/2016 05/16/2015  PHQ - 2 Score 2 3 0 4 0 0 3  PHQ- 9 Score 7 13 - 6 - 0 3    Assessment: Patient will continue to benefit from health coach outreach for disease management and support. THN CM Care Plan Problem One     Most Recent Value  THN Long Term Goal   In 60 days the patient will lower his a1c of 9.5 1-2 points.  THN Long Term Goal Start Date  02/07/18  Interventions for Problem One Long Term  Goal  Discussed with caregiver diet, medication adherence and checking blood sugars       Plan: RN Health Coach will contact patient in the month of March and patient agrees to next outreach.   Lazaro Arms RN, BSN, [place] Direct Dial:  [phone]  Fax: [phone]

## 2018-02-17 ENCOUNTER — Other Ambulatory Visit: Payer: Self-pay | Admitting: Family Medicine

## 2018-02-17 ENCOUNTER — Telehealth: Payer: Self-pay | Admitting: Family Medicine

## 2018-02-17 MED ORDER — DONEPEZIL HCL 10 MG PO TABS: 10.0000 mg | ORAL_TABLET | Freq: Every day | ORAL | 11 refills | Status: DC

## 2018-02-17 NOTE — Telephone Encounter (Signed)
Up to 10 mg a day of aricept.

## 2018-02-17 NOTE — Telephone Encounter (Signed)
Pt's wife called and states that he is tolerating Donepezil well and you had mentioned increasing the dose.

## 2018-02-18 NOTE — Telephone Encounter (Signed)
Pt's wife aware.

## 2018-02-24 ENCOUNTER — Telehealth: Payer: Self-pay | Admitting: Orthopaedic Surgery

## 2018-02-24 MED ORDER — HYDROCODONE-ACETAMINOPHEN 5-325 MG PO TABS: 1.0000 | ORAL_TABLET | Freq: Four times a day (QID) | ORAL | 0 refills | Status: DC | PRN

## 2018-02-24 NOTE — Telephone Encounter (Signed)
Patient requests refill: °HYDROcodone-acetaminophen (NORCO/VICODIN) 5-325 MG tablet 90 tablet  ° - [facility], [address], [place] ° °

## 2018-03-24 ENCOUNTER — Telehealth: Payer: Self-pay | Admitting: Orthopaedic Surgery

## 2018-03-24 MED ORDER — HYDROCODONE-ACETAMINOPHEN 5-325 MG PO TABS: 1.0000 | ORAL_TABLET | Freq: Four times a day (QID) | ORAL | 0 refills | Status: DC | PRN

## 2018-03-24 NOTE — Telephone Encounter (Signed)
Hydrocodone-Acetaminophen 5/325 mg Qty  90 Tablets ° °PATIENT USES [facility] ON [address] °

## 2018-03-28 ENCOUNTER — Other Ambulatory Visit: Payer: Self-pay | Admitting: Family Medicine

## 2018-03-28 NOTE — Telephone Encounter (Signed)
Ok to refill??  Last office visit 01/24/2018.  Last refill 12/30/2017, #2 refills.

## 2018-03-29 ENCOUNTER — Other Ambulatory Visit: Payer: Self-pay

## 2018-03-29 ENCOUNTER — Ambulatory Visit: Payer: PPO | Admitting: Orthopaedic Surgery

## 2018-03-29 ENCOUNTER — Encounter: Payer: Self-pay | Admitting: Orthopaedic Surgery

## 2018-03-29 VITALS — BP 120/74 | HR 79 | Ht 68.0 in | Wt 147.0 lb

## 2018-03-29 DIAGNOSIS — G8929 Other chronic pain: Secondary | ICD-10-CM

## 2018-03-29 DIAGNOSIS — F17210 Nicotine dependence, cigarettes, uncomplicated: Secondary | ICD-10-CM

## 2018-03-29 DIAGNOSIS — M5441 Lumbago with sciatica, right side: Secondary | ICD-10-CM | POA: Diagnosis not present
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
## 2018-04-03 ENCOUNTER — Other Ambulatory Visit: Payer: Self-pay | Admitting: Family Medicine

## 2018-04-08 ENCOUNTER — Other Ambulatory Visit: Payer: Self-pay

## 2018-04-08 NOTE — Patient Outreach (Signed)
[facility]) Care Management  04/08/2018   [name] [name] [birth year] [id number]  Subjective: Successful outreach .  HIPAA verified by [name] (on ROI) .  Caregiver states that the patient has been doing well.  He still has pain in his back and rates the pain at a 8/10.  He is taking medication for the pain.  His a1c went from a 9.5 to 9.7.  Caregiver states that the patient was forgetting to take his Lantus. She states that they have a routine set that he will check his FBS and she will give him his shot. She is monitoring his diet.  She states that he has his medication and he is taking them as prescribed.  They have food in the home.  Advised patient to avoid people who are sick, social distancing and washing hands with soap and water or using hand sanitizer.  She verbalized understanding.  She states that the patient will go back to follow up on his diabetes in June.   Current Medications:  Current Outpatient Medications  Medication Sig Dispense Refill  . ALPRAZolam (XANAX) 0.5 MG tablet TAKE 1 TABLET BY MOUTH THREE TIMES DAILY AS NEEDED 60 tablet 2  . Blood Glucose Monitoring Suppl (ONETOUCH VERIO) w/Device KIT USE TO TEST BLOOD SUGAR LEVELS THREE TIMES DAILY 1 kit 0  . diclofenac (VOLTAREN) 75 MG EC tablet Take 75 mg by mouth 2 (two) times daily.    Marland Kitchen donepezil (ARICEPT) 10 MG tablet Take 1 tablet (10 mg total) by mouth at bedtime. 30 tablet 11  . Dulaglutide (TRULICITY) 7.26 OM/3.5DH SOPN Inject 0.75 mg into the skin once a week. 4 pen 3  . fluticasone (FLONASE) 50 MCG/ACT nasal spray SHAKE WELL AND USE 2 SPRAYS IN EACH NOSTRIL DAILY 16 g 3  . HYDROcodone-acetaminophen (NORCO/VICODIN) 5-325 MG tablet Take 1 tablet by mouth every 6 (six) hours as needed for moderate pain (Must last 30 days). 90 tablet 0  . Insulin Glargine (LANTUS SOLOSTAR) 100 UNIT/ML Solostar Pen ADMINISTER 15 UNITS UNDER THE SKIN DAILY 15 mL 1  . Insulin Pen Needle (PEN NEEDLES) 31G X 8 MM MISC 1 each by  Does not apply route daily. 100 each 3  . losartan (COZAAR) 50 MG tablet TAKE 1 TABLET BY MOUTH EVERY NIGHT AT BEDTIME 90 tablet 2  . metFORMIN (GLUCOPHAGE) 1000 MG tablet TAKE 1 TABLET BY MOUTH TWICE DAILY WITH MEALS 180 tablet 2  . ONETOUCH DELICA LANCETS 74B MISC CHECK BLOOD SUGAR TWICE DAILY TO THREE TIMES DAILY 300 each 0  . ONETOUCH VERIO test strip USE TO TEST BLOOD SUGAR 2 TO 3 TIMES DAILY 250 each 1  . pioglitazone (ACTOS) 45 MG tablet TAKE 1 TABLET BY MOUTH EVERY NIGHT AT BEDTIME 30 tablet 3  . ranitidine (ZANTAC) 150 MG tablet Take 150 mg by mouth daily as needed for heartburn.    . rosuvastatin (CRESTOR) 40 MG tablet Take 1 tablet (40 mg total) by mouth daily. 90 tablet 3  . dipyridamole-aspirin (AGGRENOX) 200-25 MG 12hr capsule TAKE 1 CAPSULE BY MOUTH TWICE DAILY (Patient not taking: Reported on 01/24/2018) 180 capsule 3   No current facility-administered medications for this visit.     Functional Status:  In your present state of health, do you have any difficulty performing the following activities: 08/17/2017  Hearing? Y  Vision? N  Difficulty concentrating or making decisions? Y  Walking or climbing stairs? N  Dressing or bathing? N  Doing errands, shopping? N  Some recent  data might be hidden    Fall/Depression Screening: Fall Risk  04/08/2018 02/07/2018 12/20/2017  Falls in the past year? 0 0 0  Number falls in past yr: - - -  Injury with Fall? - - -   PHQ 2/9 Scores 08/17/2017 08/03/2017 07/26/2017 04/07/2017 10/22/2016 05/11/2016 05/16/2015  PHQ - 2 Score 2 3 0 4 0 0 3  PHQ- 9 Score 7 13 - 6 - 0 3    Assessment: Patient will continue to benefit from health coach outreach for disease management and support.  THN CM Care Plan Problem One     Most Recent Value  THN Long Term Goal   In 90 days the patient will lower his a1c of 9.7 1-2 points.  THN Long Term Goal Start Date  04/08/18  Interventions for Problem One Long Term Goal  REviewed FBS, dicussed low carb diet  and  medication adherence.       Plan: RN Health Coach will contact patient in the month of June and patient agrees to next outreach.   Lazaro Arms RN, BSN, [place] Direct Dial:  [phone]  Fax: [phone]

## 2018-04-21 ENCOUNTER — Telehealth: Payer: Self-pay | Admitting: Orthopaedic Surgery

## 2018-04-21 MED ORDER — HYDROCODONE-ACETAMINOPHEN 5-325 MG PO TABS: 1.0000 | ORAL_TABLET | Freq: Four times a day (QID) | ORAL | 0 refills | Status: DC | PRN

## 2018-04-21 NOTE — Telephone Encounter (Signed)
Patient requests refill: HYDROcodone-acetaminophen (NORCO/VICODIN) 5-325 MG tablet 90 tablet   - [facility], [address], [place]

## 2018-04-21 NOTE — Telephone Encounter (Signed)
Done per Dr [name].

## 2018-04-23 ENCOUNTER — Other Ambulatory Visit: Payer: Self-pay | Admitting: Family Medicine

## 2018-05-19 ENCOUNTER — Telehealth: Payer: Self-pay | Admitting: Orthopaedic Surgery

## 2018-05-19 MED ORDER — HYDROCODONE-ACETAMINOPHEN 5-325 MG PO TABS: 1.0000 | ORAL_TABLET | Freq: Four times a day (QID) | ORAL | 0 refills | Status: DC | PRN

## 2018-05-19 NOTE — Telephone Encounter (Signed)
Hydrocodone-Acetaminophen 5/325 mg Qty  90 Tablets  PATIENT USES [facility] ON [address]

## 2018-06-18 ENCOUNTER — Other Ambulatory Visit: Payer: Self-pay | Admitting: Family Medicine

## 2018-06-20 ENCOUNTER — Telehealth: Payer: Self-pay | Admitting: Orthopaedic Surgery

## 2018-06-20 NOTE — Telephone Encounter (Signed)
Patient requests refill: HYDROcodone-acetaminophen (NORCO/VICODIN) 5-325 MG tablet 1 tablet   - [place]    - pt aware of appointment 06/1618

## 2018-06-21 MED ORDER — HYDROCODONE-ACETAMINOPHEN 5-325 MG PO TABS: 1.0000 | ORAL_TABLET | Freq: Four times a day (QID) | ORAL | 0 refills | Status: DC | PRN

## 2018-06-28 ENCOUNTER — Ambulatory Visit: Payer: PPO | Admitting: Orthopaedic Surgery

## 2018-06-28 ENCOUNTER — Other Ambulatory Visit: Payer: Self-pay | Admitting: Family Medicine

## 2018-06-28 ENCOUNTER — Encounter: Payer: Self-pay | Admitting: Orthopaedic Surgery

## 2018-06-28 ENCOUNTER — Other Ambulatory Visit: Payer: Self-pay

## 2018-06-28 VITALS — BP 136/74 | HR 84 | Temp 97.2°F | Ht 68.0 in | Wt 147.0 lb

## 2018-06-28 DIAGNOSIS — M5441 Lumbago with sciatica, right side: Secondary | ICD-10-CM | POA: Diagnosis not present

## 2018-06-28 DIAGNOSIS — F17210 Nicotine dependence, cigarettes, uncomplicated: Secondary | ICD-10-CM

## 2018-06-28 DIAGNOSIS — G8929 Other chronic pain: Secondary | ICD-10-CM
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
## 2018-06-28 NOTE — Patient Instructions (Signed)
Steps to Quit Smoking    Smoking tobacco can be bad for your health. It can also affect almost every organ in your body. Smoking puts you and people around you at risk for many serious long-lasting (chronic) diseases. Quitting smoking is hard, but it is one of the best things that you can do for your health. It is never too late to quit.  What are the benefits of quitting smoking?  When you quit smoking, you lower your risk for getting serious diseases and conditions. They can include:  · Lung cancer or lung disease.  · Heart disease.  · Stroke.  · Heart attack.  · Not being able to have children (infertility).  · Weak bones (osteoporosis) and broken bones (fractures).  If you have coughing, wheezing, and shortness of breath, those symptoms may get better when you quit. You may also get sick less often. If you are pregnant, quitting smoking can help to lower your chances of having a baby of low birth weight.  What can I do to help me quit smoking?  Talk with your doctor about what can help you quit smoking. Some things you can do (strategies) include:  · Quitting smoking totally, instead of slowly cutting back how much you smoke over a period of time.  · Going to in-person counseling. You are more likely to quit if you go to many counseling sessions.  · Using resources and support systems, such as:  ? Online chats with a counselor.  ? Phone quitlines.  ? Printed self-help materials.  ? Support groups or group counseling.  ? Text messaging programs.  ? Mobile phone apps or applications.  · Taking medicines. Some of these medicines may have nicotine in them. If you are pregnant or breastfeeding, do not take any medicines to quit smoking unless your doctor says it is okay. Talk with your doctor about counseling or other things that can help you.  Talk with your doctor about using more than one strategy at the same time, such as taking medicines while you are also going to in-person counseling. This can help make  quitting easier.  What things can I do to make it easier to quit?  Quitting smoking might feel very hard at first, but there is a lot that you can do to make it easier. Take these steps:  · Talk to your family and friends. Ask them to support and encourage you.  · Call phone quitlines, reach out to support groups, or work with a counselor.  · Ask people who smoke to not smoke around you.  · Avoid places that make you want (trigger) to smoke, such as:  ? Bars.  ? Parties.  ? Smoke-break areas at work.  · Spend time with people who do not smoke.  · Lower the stress in your life. Stress can make you want to smoke. Try these things to help your stress:  ? Getting regular exercise.  ? Deep-breathing exercises.  ? Yoga.  ? Meditating.  ? Doing a body scan. To do this, close your eyes, focus on one area of your body at a time from head to toe, and notice which parts of your body are tense. Try to relax the muscles in those areas.  · Download or buy apps on your mobile phone or tablet that can help you stick to your quit plan. There are many free apps, such as QuitGuide from the CDC (Centers for Disease Control and Prevention). You can find more   support from smokefree.gov and other websites.  This information is not intended to replace advice given to you by your health care provider. Make sure you discuss any questions you have with your health care provider.  Document Released: 10/25/2008 Document Revised: 08/27/2015 Document Reviewed: 05/15/2014  Elsevier Interactive Patient Education © 2019 Elsevier Inc.

## 2018-06-29 NOTE — Telephone Encounter (Signed)
Pt is requesting refill on Xanax   LOV: 01/24/18  LRF:   03/29/18

## 2018-07-05 ENCOUNTER — Other Ambulatory Visit: Payer: Self-pay

## 2018-07-05 NOTE — Patient Outreach (Signed)
[facility]) Care Management  07/05/2018  [name] [name] 07/22/1940 [id number]    RN Health Coach closing the program.  Patient is transitioning to external program [facility] for continued case management.  [name] Arms RN, BSN, [place] Direct Dial:  [phone]  Fax: [phone]

## 2018-07-08 ENCOUNTER — Ambulatory Visit: Payer: PPO

## 2018-07-20 ENCOUNTER — Telehealth: Payer: Self-pay | Admitting: Orthopaedic Surgery

## 2018-07-20 NOTE — Telephone Encounter (Signed)
Patient requests refill on Hydrocodone/Acetaminophen 5-325  Mgs.  Qty  90  Sig: Take 1 tablet by mouth every 6 (six) hours as needed for moderate pain (Must last 30 days).  Patient states he uses [facility] on [address]

## 2018-07-21 MED ORDER — HYDROCODONE-ACETAMINOPHEN 5-325 MG PO TABS: 1.0000 | ORAL_TABLET | Freq: Four times a day (QID) | ORAL | 0 refills | Status: DC | PRN

## 2018-07-29 ENCOUNTER — Other Ambulatory Visit: Payer: Self-pay | Admitting: Family Medicine

## 2018-07-29 NOTE — Telephone Encounter (Signed)
Last refilled: 06/30/2018 Last office visit: January 2020

## 2018-08-19 ENCOUNTER — Telehealth: Payer: Self-pay | Admitting: Orthopaedic Surgery

## 2018-08-19 NOTE — Telephone Encounter (Signed)
Patient requests refill on Hydrocodone/Acetaminophen 5-325  Mgs.  Qty  90       Sig: Take 1 tablet by mouth every 6 (six) hours as needed for moderate pain (Must last 30 days).        Patient uses [facility] on [address]

## 2018-08-23 MED ORDER — HYDROCODONE-ACETAMINOPHEN 5-325 MG PO TABS: 1.0000 | ORAL_TABLET | Freq: Four times a day (QID) | ORAL | 0 refills | Status: DC | PRN

## 2018-08-29 ENCOUNTER — Other Ambulatory Visit: Payer: Self-pay | Admitting: Family Medicine

## 2018-08-29 NOTE — Telephone Encounter (Signed)
Pt is requesting refill on Xanax   LOV: 01/24/18  LRF:   07/29/18

## 2018-09-04 ENCOUNTER — Other Ambulatory Visit: Payer: Self-pay | Admitting: Family Medicine

## 2018-09-22 ENCOUNTER — Telehealth: Payer: Self-pay | Admitting: Orthopaedic Surgery

## 2018-09-22 MED ORDER — HYDROCODONE-ACETAMINOPHEN 5-325 MG PO TABS: 1.0000 | ORAL_TABLET | Freq: Four times a day (QID) | ORAL | 0 refills | Status: DC | PRN

## 2018-09-22 NOTE — Telephone Encounter (Signed)
Patient requests refill: HYDROcodone-acetaminophen (NORCO/VICODIN) 5-325 MG tablet [address]  [place] pt aware of upcoming appointment 09/27/18 and importance of keeping scheduled appointment.

## 2018-09-27 ENCOUNTER — Encounter: Payer: Self-pay | Admitting: Orthopaedic Surgery

## 2018-09-27 ENCOUNTER — Ambulatory Visit: Payer: PPO | Admitting: Orthopaedic Surgery

## 2018-09-27 ENCOUNTER — Other Ambulatory Visit: Payer: Self-pay

## 2018-09-27 VITALS — BP 107/59 | HR 83 | Ht 68.0 in | Wt 147.0 lb

## 2018-09-27 DIAGNOSIS — G8929 Other chronic pain: Secondary | ICD-10-CM

## 2018-09-27 DIAGNOSIS — F17210 Nicotine dependence, cigarettes, uncomplicated: Secondary | ICD-10-CM | POA: Diagnosis not present

## 2018-09-27 DIAGNOSIS — M5441 Lumbago with sciatica, right side: Secondary | ICD-10-CM | POA: Diagnosis not present
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
## 2018-09-28 ENCOUNTER — Other Ambulatory Visit: Payer: Self-pay | Admitting: Family Medicine

## 2018-09-28 MED ORDER — LANTUS SOLOSTAR 100 UNIT/ML ~~LOC~~ SOPN: PEN_INJECTOR | SUBCUTANEOUS | 1 refills | Status: DC

## 2018-09-28 NOTE — Telephone Encounter (Signed)
Pt is requesting refill on Xanax   LOV: 01/24/18  LRF:   08/30/18

## 2018-09-28 NOTE — Telephone Encounter (Signed)
Patients wife called in requesting refill on his alprazolam, lantus called into [facility] on [address] in [place]. Wife made an appt for 10/06/2018 for follow up.  CB# [phone]

## 2018-09-29 MED ORDER — ALPRAZOLAM 0.5 MG PO TABS: 0.5000 mg | ORAL_TABLET | Freq: Three times a day (TID) | ORAL | 0 refills | Status: DC | PRN

## 2018-10-06 ENCOUNTER — Ambulatory Visit (INDEPENDENT_AMBULATORY_CARE_PROVIDER_SITE_OTHER): Payer: PPO | Admitting: Family Medicine

## 2018-10-06 ENCOUNTER — Other Ambulatory Visit: Payer: Self-pay

## 2018-10-06 ENCOUNTER — Encounter: Payer: Self-pay | Admitting: Family Medicine

## 2018-10-06 VITALS — BP 120/60 | HR 86 | Temp 97.4°F | Resp 18 | Ht 68.0 in | Wt 144.0 lb

## 2018-10-06 DIAGNOSIS — I6930 Unspecified sequelae of cerebral infarction: Secondary | ICD-10-CM

## 2018-10-06 DIAGNOSIS — F17200 Nicotine dependence, unspecified, uncomplicated: Secondary | ICD-10-CM | POA: Diagnosis not present

## 2018-10-06 DIAGNOSIS — E785 Hyperlipidemia, unspecified: Secondary | ICD-10-CM

## 2018-10-06 DIAGNOSIS — I639 Cerebral infarction, unspecified: Secondary | ICD-10-CM

## 2018-10-06 DIAGNOSIS — F0280 Dementia in other diseases classified elsewhere without behavioral disturbance: Secondary | ICD-10-CM

## 2018-10-06 DIAGNOSIS — IMO0002 Reserved for concepts with insufficient information to code with codable children: Secondary | ICD-10-CM

## 2018-10-06 DIAGNOSIS — E118 Type 2 diabetes mellitus with unspecified complications: Secondary | ICD-10-CM

## 2018-10-06 DIAGNOSIS — E1165 Type 2 diabetes mellitus with hyperglycemia: Secondary | ICD-10-CM | POA: Diagnosis not present

## 2018-10-06 MED ORDER — MEMANTINE HCL 10 MG PO TABS: 10.0000 mg | ORAL_TABLET | Freq: Two times a day (BID) | ORAL | 3 refills | Status: DC
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
## 2018-10-07 ENCOUNTER — Other Ambulatory Visit: Payer: Self-pay | Admitting: Family Medicine

## 2018-10-07 DIAGNOSIS — E119 Type 2 diabetes mellitus without complications: Secondary | ICD-10-CM

## 2018-10-07 LAB — LIPID PANEL
Cholesterol: 123 mg/dL (ref <200)
HDL: 49 mg/dL (ref >=40)
LDL Cholesterol (Calc): 54 mg/dL (calc)
Non-HDL Cholesterol (Calc): 74 mg/dL (calc) (ref <130)
Total CHOL/HDL Ratio: 2.5 (calc) (ref <5.0)
Triglycerides: 121 mg/dL (ref <150)

## 2018-10-07 LAB — COMPLETE METABOLIC PANEL WITH GFR
AG Ratio: 1.9 (calc) (ref 1.0–2.5)
ALT: 17 U/L (ref 9–46)
AST: 19 U/L (ref 10–35)
Albumin: 4 g/dL (ref 3.6–5.1)
Alkaline phosphatase (APISO): 49 U/L (ref 35–144)
BUN: 21 mg/dL (ref 7–25)
CO2: 25 mmol/L (ref 20–32)
Calcium: 9.8 mg/dL (ref 8.6–10.3)
Chloride: 104 mmol/L (ref 98–110)
Creat: 0.83 mg/dL (ref 0.70–1.18)
GFR, Est African American: 98 mL/min/{1.73_m2} (ref >=60)
GFR, Est Non African American: 85 mL/min/{1.73_m2} (ref >=60)
Globulin: 2.1 g/dL (calc) (ref 1.9–3.7)
Glucose, Bld: 96 mg/dL (ref 65–99)
Potassium: 4.6 mmol/L (ref 3.5–5.3)
Sodium: 140 mmol/L (ref 135–146)
Total Bilirubin: 0.3 mg/dL (ref 0.2–1.2)
Total Protein: 6.1 g/dL (ref 6.1–8.1)

## 2018-10-07 LAB — CBC WITH DIFFERENTIAL/PLATELET
Absolute Monocytes: 768 cells/uL (ref 200–950)
Basophils Absolute: 84 cells/uL (ref 0–200)
Basophils Relative: 0.7 %
Eosinophils Absolute: 192 cells/uL (ref 15–500)
Eosinophils Relative: 1.6 %
HCT: 38.8 % (ref 38.5–50.0)
Hemoglobin: 13 g/dL — ABNORMAL LOW (ref 13.2–17.1)
Lymphs Abs: 3672 cells/uL (ref 850–3900)
MCH: 31.3 pg (ref 27.0–33.0)
MCHC: 33.5 g/dL (ref 32.0–36.0)
MCV: 93.5 fL (ref 80.0–100.0)
MPV: 10.9 fL (ref 7.5–12.5)
Monocytes Relative: 6.4 %
Neutro Abs: 7284 cells/uL (ref 1500–7800)
Neutrophils Relative %: 60.7 %
Platelets: 222 10*3/uL (ref 140–400)
RBC: 4.15 10*6/uL — ABNORMAL LOW (ref 4.20–5.80)
RDW: 13.4 % (ref 11.0–15.0)
Total Lymphocyte: 30.6 %
WBC: 12 10*3/uL — ABNORMAL HIGH (ref 3.8–10.8)

## 2018-10-07 LAB — HEMOGLOBIN A1C
Hgb A1c MFr Bld: 9.2 % of total Hgb — ABNORMAL HIGH (ref <5.7)
Mean Plasma Glucose: 217 (calc)
eAG (mmol/L): 12 (calc)

## 2018-10-20 ENCOUNTER — Telehealth: Payer: Self-pay | Admitting: Orthopaedic Surgery

## 2018-10-20 MED ORDER — HYDROCODONE-ACETAMINOPHEN 5-325 MG PO TABS: 1.0000 | ORAL_TABLET | Freq: Four times a day (QID) | ORAL | 0 refills | Status: DC | PRN

## 2018-10-20 NOTE — Telephone Encounter (Signed)
Patient requests refill on Hydrocodone/Acetaminophen 5-325  Mgs.  Qty  90  Sig: Take 1 tablet by mouth every 6 (six) hours as needed for moderate pain (Must last 30 days).  Patient states he uses [facility] on [address] 

## 2018-10-28 ENCOUNTER — Other Ambulatory Visit: Payer: Self-pay | Admitting: Family Medicine

## 2018-10-31 NOTE — Telephone Encounter (Signed)
Pt is requesting refill on Xanax   LOV: 10/06/18  LRF:  09/29/18

## 2018-11-01 ENCOUNTER — Telehealth: Payer: Self-pay | Admitting: Family Medicine

## 2018-11-01 NOTE — Telephone Encounter (Signed)
Called [facility] and they are out of the .5mg  - verbal given for .25mg  2 tabs po tid prn  Qty 120   Pt's wife made aware

## 2018-11-01 NOTE — Telephone Encounter (Signed)
Patient is calling to say that noone has xanax in stock would like to know what they should do , he needs this medication.  [phone]

## 2018-11-22 ENCOUNTER — Telehealth: Payer: Self-pay | Admitting: Orthopaedic Surgery

## 2018-11-22 MED ORDER — HYDROCODONE-ACETAMINOPHEN 5-325 MG PO TABS: 1.0000 | ORAL_TABLET | Freq: Four times a day (QID) | ORAL | 0 refills | Status: DC | PRN

## 2018-11-22 NOTE — Telephone Encounter (Signed)
Patient(designated contact, spouse) called to request refill: HYDROcodone-acetaminophen (NORCO/VICODIN) 5-325 MG tablet 90 tablet  - [employer], [place], [place]   - patient aware of next scheduled appointment, 12/20/18

## 2018-12-20 ENCOUNTER — Encounter: Payer: Self-pay | Admitting: Orthopaedic Surgery

## 2018-12-20 ENCOUNTER — Other Ambulatory Visit: Payer: Self-pay

## 2018-12-20 ENCOUNTER — Ambulatory Visit: Payer: PPO | Admitting: Orthopaedic Surgery

## 2018-12-20 VITALS — BP 142/71 | HR 77 | Ht 68.0 in | Wt 144.0 lb

## 2018-12-20 DIAGNOSIS — F17210 Nicotine dependence, cigarettes, uncomplicated: Secondary | ICD-10-CM

## 2018-12-20 DIAGNOSIS — G8929 Other chronic pain: Secondary | ICD-10-CM

## 2018-12-20 DIAGNOSIS — M5441 Lumbago with sciatica, right side: Secondary | ICD-10-CM | POA: Diagnosis not present

## 2018-12-20 MED ORDER — HYDROCODONE-ACETAMINOPHEN 5-325 MG PO TABS: 1.0000 | ORAL_TABLET | Freq: Four times a day (QID) | ORAL | 0 refills | Status: DC | PRN
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
## 2018-12-20 NOTE — Patient Instructions (Signed)
Steps to Quit Smoking Smoking tobacco is the leading cause of preventable death. It can affect almost every organ in the body. Smoking puts you and people around you at risk for many serious, long-lasting (chronic) diseases. Quitting smoking can be hard, but it is one of the best things that you can do for your health. It is never too late to quit. How do I get ready to quit? When you decide to quit smoking, make a plan to help you succeed. Before you quit:  Pick a date to quit. Set a date within the next 2 weeks to give you time to prepare.  Write down the reasons why you are quitting. Keep this list in places where you will see it often.  Tell your family, friends, and co-workers that you are quitting. Their support is important.  Talk with your doctor about the choices that may help you quit.  Find out if your health insurance will pay for these treatments.  Know the people, places, things, and activities that make you want to smoke (triggers). Avoid them. What first steps can I take to quit smoking?  Throw away all cigarettes at home, at work, and in your car.  Throw away the things that you use when you smoke, such as ashtrays and lighters.  Clean your car. Make sure to empty the ashtray.  Clean your home, including curtains and carpets. What can I do to help me quit smoking? Talk with your doctor about taking medicines and seeing a counselor at the same time. You are more likely to succeed when you do both.  If you are pregnant or breastfeeding, talk with your doctor about counseling or other ways to quit smoking. Do not take medicine to help you quit smoking unless your doctor tells you to do so. To quit smoking: Quit right away  Quit smoking totally, instead of slowly cutting back on how much you smoke over a period of time.  Go to counseling. You are more likely to quit if you go to counseling sessions regularly. Take medicine You may take medicines to help you quit. Some  medicines need a prescription, and some you can buy over-the-counter. Some medicines may contain a drug called nicotine to replace the nicotine in cigarettes. Medicines may:  Help you to stop having the desire to smoke (cravings).  Help to stop the problems that come when you stop smoking (withdrawal symptoms). Your doctor may ask you to use:  Nicotine patches, gum, or lozenges.  Nicotine inhalers or sprays.  Non-nicotine medicine that is taken by mouth. Find resources Find resources and other ways to help you quit smoking and remain smoke-free after you quit. These resources are most helpful when you use them often. They include:  Online chats with a counselor.  Phone quitlines.  Printed self-help materials.  Support groups or group counseling.  Text messaging programs.  Mobile phone apps. Use apps on your mobile phone or tablet that can help you stick to your quit plan. There are many free apps for mobile phones and tablets as well as websites. Examples include Quit Guide from the CDC and smokefree.gov  What things can I do to make it easier to quit?   Talk to your family and friends. Ask them to support and encourage you.  Call a phone quitline (1-800-QUIT-NOW), reach out to support groups, or work with a counselor.  Ask people who smoke to not smoke around you.  Avoid places that make you want to smoke,   such as: ? Bars. ? Parties. ? Smoke-break areas at work.  Spend time with people who do not smoke.  Lower the stress in your life. Stress can make you want to smoke. Try these things to help your stress: ? Getting regular exercise. ? Doing deep-breathing exercises. ? Doing yoga. ? Meditating. ? Doing a body scan. To do this, close your eyes, focus on one area of your body at a time from head to toe. Notice which parts of your body are tense. Try to relax the muscles in those areas. How will I feel when I quit smoking? Day 1 to 3 weeks Within the first 24 hours,  you may start to have some problems that come from quitting tobacco. These problems are very bad 2-3 days after you quit, but they do not often last for more than 2-3 weeks. You may get these symptoms:  Mood swings.  Feeling restless, nervous, angry, or annoyed.  Trouble concentrating.  Dizziness.  Strong desire for high-sugar foods and nicotine.  Weight gain.  Trouble pooping (constipation).  Feeling like you may vomit (nausea).  Coughing or a sore throat.  Changes in how the medicines that you take for other issues work in your body.  Depression.  Trouble sleeping (insomnia). Week 3 and afterward After the first 2-3 weeks of quitting, you may start to notice more positive results, such as:  Better sense of smell and taste.  Less coughing and sore throat.  Slower heart rate.  Lower blood pressure.  Clearer skin.  Better breathing.  Fewer sick days. Quitting smoking can be hard. Do not give up if you fail the first time. Some people need to try a few times before they succeed. Do your best to stick to your quit plan, and talk with your doctor if you have any questions or concerns. Summary  Smoking tobacco is the leading cause of preventable death. Quitting smoking can be hard, but it is one of the best things that you can do for your health.  When you decide to quit smoking, make a plan to help you succeed.  Quit smoking right away, not slowly over a period of time.  When you start quitting, seek help from your doctor, family, or friends. This information is not intended to replace advice given to you by your health care provider. Make sure you discuss any questions you have with your health care provider. Document Released: 10/25/2008 Document Revised: 03/18/2018 Document Reviewed: 03/19/2018 Elsevier Patient Education  2020 Elsevier Inc.  

## 2018-12-22 ENCOUNTER — Ambulatory Visit: Payer: PPO | Admitting: Orthopaedic Surgery

## 2019-01-19 ENCOUNTER — Telehealth: Payer: Self-pay | Admitting: Orthopaedic Surgery

## 2019-01-19 MED ORDER — HYDROCODONE-ACETAMINOPHEN 5-325 MG PO TABS: 1.0000 | ORAL_TABLET | Freq: Four times a day (QID) | ORAL | 0 refills | Status: DC | PRN

## 2019-01-19 NOTE — Telephone Encounter (Signed)
Patient/spouse, designated party contact, requests refill for patient: HYDROcodone-acetaminophen (NORCO/VICODIN) 5-325 MG tablet 90 tablet  -[facility], [place]

## 2019-01-29 ENCOUNTER — Other Ambulatory Visit: Payer: Self-pay | Admitting: Family Medicine

## 2019-01-30 NOTE — Telephone Encounter (Signed)
Ok to refill??  Last office visit 10/06/2018.  Last refill 12/20/2018 from historical provider.

## 2019-02-16 ENCOUNTER — Telehealth: Payer: Self-pay | Admitting: Orthopaedic Surgery

## 2019-02-16 MED ORDER — HYDROCODONE-ACETAMINOPHEN 5-325 MG PO TABS: 1.0000 | ORAL_TABLET | Freq: Four times a day (QID) | ORAL | 0 refills | Status: DC | PRN

## 2019-02-16 NOTE — Telephone Encounter (Signed)
Patient called for refill HYDROcodone-acetaminophen (NORCO/VICODIN) 5-325 MG tablet - [facility], [address], [place]

## 2019-03-01 ENCOUNTER — Other Ambulatory Visit: Payer: Self-pay | Admitting: Family Medicine

## 2019-03-01 MED ORDER — METFORMIN HCL 1000 MG PO TABS: 1000.0000 mg | ORAL_TABLET | Freq: Two times a day (BID) | ORAL | 2 refills | Status: DC

## 2019-03-01 NOTE — Telephone Encounter (Signed)
Pt is requesting refill on Xanax   LOV: 10/06/18  LRF: 1/18/221

## 2019-03-03 MED ORDER — ALPRAZOLAM 0.25 MG PO TABS: ORAL_TABLET | ORAL | 1 refills | Status: DC

## 2019-03-08 ENCOUNTER — Other Ambulatory Visit: Payer: Self-pay | Admitting: Family Medicine

## 2019-03-08 MED ORDER — DONEPEZIL HCL 5 MG PO TABS: ORAL_TABLET | ORAL | 3 refills | Status: DC

## 2019-03-08 MED ORDER — PIOGLITAZONE HCL 45 MG PO TABS: 45.0000 mg | ORAL_TABLET | Freq: Every day | ORAL | 1 refills | Status: DC

## 2019-03-16 ENCOUNTER — Telehealth: Payer: Self-pay | Admitting: Orthopaedic Surgery

## 2019-03-16 MED ORDER — HYDROCODONE-ACETAMINOPHEN 5-325 MG PO TABS: 1.0000 | ORAL_TABLET | Freq: Four times a day (QID) | ORAL | 0 refills | Status: DC | PRN

## 2019-03-16 NOTE — Telephone Encounter (Signed)
Patient/spouse,designated contact request refill: HYDROcodone-acetaminophen (NORCO/VICODIN) 5-325 MG tablet 90 tablet  -[facility], [address], [place]

## 2019-03-21 ENCOUNTER — Ambulatory Visit: Payer: PPO | Admitting: Orthopaedic Surgery

## 2019-03-28 ENCOUNTER — Ambulatory Visit: Payer: PPO | Admitting: Orthopaedic Surgery

## 2019-03-28 ENCOUNTER — Other Ambulatory Visit: Payer: Self-pay

## 2019-03-28 ENCOUNTER — Encounter: Payer: Self-pay | Admitting: Orthopaedic Surgery

## 2019-03-28 VITALS — BP 116/69 | HR 70 | Ht 68.0 in | Wt 140.0 lb

## 2019-03-28 DIAGNOSIS — M5441 Lumbago with sciatica, right side: Secondary | ICD-10-CM

## 2019-03-28 DIAGNOSIS — F17210 Nicotine dependence, cigarettes, uncomplicated: Secondary | ICD-10-CM | POA: Diagnosis not present

## 2019-03-28 DIAGNOSIS — G8929 Other chronic pain: Secondary | ICD-10-CM

## 2019-03-28 NOTE — Patient Instructions (Signed)
Steps to Quit Smoking Smoking tobacco is the leading cause of preventable death. It can affect almost every organ in the body. Smoking puts you and people around you at risk for many serious, long-lasting (chronic) diseases. Quitting smoking can be hard, but it is one of the best things that you can do for your health. It is never too late to quit. How do I get ready to quit? When you decide to quit smoking, make a plan to help you succeed. Before you quit:  Pick a date to quit. Set a date within the next 2 weeks to give you time to prepare.  Write down the reasons why you are quitting. Keep this list in places where you will see it often.  Tell your family, friends, and co-workers that you are quitting. Their support is important.  Talk with your doctor about the choices that may help you quit.  Find out if your health insurance will pay for these treatments.  Know the people, places, things, and activities that make you want to smoke (triggers). Avoid them. What first steps can I take to quit smoking?  Throw away all cigarettes at home, at work, and in your car.  Throw away the things that you use when you smoke, such as ashtrays and lighters.  Clean your car. Make sure to empty the ashtray.  Clean your home, including curtains and carpets. What can I do to help me quit smoking? Talk with your doctor about taking medicines and seeing a counselor at the same time. You are more likely to succeed when you do both.  If you are pregnant or breastfeeding, talk with your doctor about counseling or other ways to quit smoking. Do not take medicine to help you quit smoking unless your doctor tells you to do so. To quit smoking: Quit right away  Quit smoking totally, instead of slowly cutting back on how much you smoke over a period of time.  Go to counseling. You are more likely to quit if you go to counseling sessions regularly. Take medicine You may take medicines to help you quit. Some  medicines need a prescription, and some you can buy over-the-counter. Some medicines may contain a drug called nicotine to replace the nicotine in cigarettes. Medicines may:  Help you to stop having the desire to smoke (cravings).  Help to stop the problems that come when you stop smoking (withdrawal symptoms). Your doctor may ask you to use:  Nicotine patches, gum, or lozenges.  Nicotine inhalers or sprays.  Non-nicotine medicine that is taken by mouth. Find resources Find resources and other ways to help you quit smoking and remain smoke-free after you quit. These resources are most helpful when you use them often. They include:  Online chats with a counselor.  Phone quitlines.  Printed self-help materials.  Support groups or group counseling.  Text messaging programs.  Mobile phone apps. Use apps on your mobile phone or tablet that can help you stick to your quit plan. There are many free apps for mobile phones and tablets as well as websites. Examples include Quit Guide from the CDC and smokefree.gov  What things can I do to make it easier to quit?   Talk to your family and friends. Ask them to support and encourage you.  Call a phone quitline (1-800-QUIT-NOW), reach out to support groups, or work with a counselor.  Ask people who smoke to not smoke around you.  Avoid places that make you want to smoke,   such as: ? Bars. ? Parties. ? Smoke-break areas at work.  Spend time with people who do not smoke.  Lower the stress in your life. Stress can make you want to smoke. Try these things to help your stress: ? Getting regular exercise. ? Doing deep-breathing exercises. ? Doing yoga. ? Meditating. ? Doing a body scan. To do this, close your eyes, focus on one area of your body at a time from head to toe. Notice which parts of your body are tense. Try to relax the muscles in those areas. How will I feel when I quit smoking? Day 1 to 3 weeks Within the first 24 hours,  you may start to have some problems that come from quitting tobacco. These problems are very bad 2-3 days after you quit, but they do not often last for more than 2-3 weeks. You may get these symptoms:  Mood swings.  Feeling restless, nervous, angry, or annoyed.  Trouble concentrating.  Dizziness.  Strong desire for high-sugar foods and nicotine.  Weight gain.  Trouble pooping (constipation).  Feeling like you may vomit (nausea).  Coughing or a sore throat.  Changes in how the medicines that you take for other issues work in your body.  Depression.  Trouble sleeping (insomnia). Week 3 and afterward After the first 2-3 weeks of quitting, you may start to notice more positive results, such as:  Better sense of smell and taste.  Less coughing and sore throat.  Slower heart rate.  Lower blood pressure.  Clearer skin.  Better breathing.  Fewer sick days. Quitting smoking can be hard. Do not give up if you fail the first time. Some people need to try a few times before they succeed. Do your best to stick to your quit plan, and talk with your doctor if you have any questions or concerns. Summary  Smoking tobacco is the leading cause of preventable death. Quitting smoking can be hard, but it is one of the best things that you can do for your health.  When you decide to quit smoking, make a plan to help you succeed.  Quit smoking right away, not slowly over a period of time.  When you start quitting, seek help from your doctor, family, or friends. This information is not intended to replace advice given to you by your health care provider. Make sure you discuss any questions you have with your health care provider. Document Revised: 09/23/2018 Document Reviewed: 03/19/2018 Elsevier Patient Education  2020 [employer] Inc.  
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
## 2019-04-13 ENCOUNTER — Other Ambulatory Visit: Payer: Self-pay | Admitting: Radiology

## 2019-04-13 MED ORDER — HYDROCODONE-ACETAMINOPHEN 5-325 MG PO TABS: 1.0000 | ORAL_TABLET | Freq: Four times a day (QID) | ORAL | 0 refills | Status: DC | PRN

## 2019-04-13 NOTE — Telephone Encounter (Signed)
Wife called in for refill for patient.

## 2019-04-17 ENCOUNTER — Other Ambulatory Visit: Payer: Self-pay | Admitting: Family Medicine

## 2019-04-30 ENCOUNTER — Other Ambulatory Visit: Payer: Self-pay | Admitting: Family Medicine

## 2019-05-01 NOTE — Telephone Encounter (Signed)
Ok to refill??  Last office visit 10/06/2018.  Last refill 2/19/20201, #1 refill.

## 2019-05-11 ENCOUNTER — Telehealth: Payer: Self-pay | Admitting: Orthopaedic Surgery

## 2019-05-11 MED ORDER — HYDROCODONE-ACETAMINOPHEN 5-325 MG PO TABS: 1.0000 | ORAL_TABLET | Freq: Four times a day (QID) | ORAL | 0 refills | Status: DC | PRN

## 2019-05-11 NOTE — Telephone Encounter (Signed)
Patient requests refill on Hydrocodone/Acetaminophen 5-325  Mgs.  Qty  90      Sig: Take 1 tablet by mouth every 6 (six) hours as needed for moderate pain (Must last 30 days).   Patient states he uses [facility] representative on [address].

## 2019-05-17 ENCOUNTER — Telehealth: Payer: Self-pay | Admitting: Family Medicine

## 2019-05-17 ENCOUNTER — Other Ambulatory Visit: Payer: Self-pay | Admitting: Family Medicine

## 2019-05-17 MED ORDER — DONEPEZIL HCL 10 MG PO TABS: 10.0000 mg | ORAL_TABLET | Freq: Every day | ORAL | 3 refills | Status: DC

## 2019-05-17 MED ORDER — MEMANTINE HCL 10 MG PO TABS: ORAL_TABLET | ORAL | 3 refills | Status: DC

## 2019-05-17 NOTE — Chronic Care Management (AMB) (Signed)
°  Chronic Care Management   Outreach Note  05/17/2019 Name: [name] [name] MRN: [id number] DOB: Jul 05, 1940  Referred by: [name], MD Reason for referral : Chronic Care Management   An unsuccessful telephone outreach was attempted today. The patient was referred to the pharmacist for assistance with care management and care coordination.   Follow Up Plan:   [place]

## 2019-05-17 NOTE — Chronic Care Management (AMB) (Signed)
  Chronic Care Management   Note  05/17/2019 Name: TYHIR RODDA MRN: [id number] DOB: 19-Feb-1940  FRISCO STAIGER is a 79 y.o. year old male who is a primary care patient of [name], MD. I reached out to [name] by phone today in response to a referral sent by Mr. ANDRES KAWCZYNSKI PCP, [name], MD.   Mr. Ballard was given information about Chronic Care Management services today including:  1. CCM service includes personalized support from designated clinical staff supervised by his physician, including individualized plan of care and coordination with other care providers 2. 24/7 contact phone numbers for assistance for urgent and routine care needs. 3. Service will only be billed when office clinical staff spend 20 minutes or more in a month to coordinate care. 4. Only one practitioner may furnish and bill the service in a calendar month. 5. The patient may stop CCM services at any time (effective at the end of the month) by phone call to the office staff.   Patient agreed to services and verbal consent obtained.   Follow up plan:   [place]

## 2019-05-22 ENCOUNTER — Other Ambulatory Visit: Payer: Self-pay | Admitting: Family Medicine

## 2019-05-24 ENCOUNTER — Telehealth: Payer: Self-pay | Admitting: Family Medicine
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
## 2019-05-25 NOTE — Telephone Encounter (Signed)
noted 

## 2019-06-02 ENCOUNTER — Telehealth: Payer: PPO

## 2019-06-08 ENCOUNTER — Telehealth: Payer: Self-pay | Admitting: Orthopaedic Surgery

## 2019-06-08 MED ORDER — HYDROCODONE-ACETAMINOPHEN 5-325 MG PO TABS: 1.0000 | ORAL_TABLET | Freq: Four times a day (QID) | ORAL | 0 refills | Status: DC | PRN

## 2019-06-08 NOTE — Telephone Encounter (Signed)
Patient requests refill on Hydrocodone/Acetaminophen 5-325  Mgs.  Qty  90  Sig: Take 1 tablet by mouth every 6 (six) hours as needed for moderate pain (Must last 30 days).  Patient states he uses [facility] on [address]

## 2019-06-14 NOTE — Chronic Care Management (AMB) (Addendum)
Chronic Care Management Pharmacy  Name: [name] [name]  MRN: [id number] DOB: Nov 21, 1940  Chief Complaint/ HPI  [name] [name],  79 y.o. , male presents for their Initial CCM visit with the clinical pharmacist via telephone.  PCP : [name], MD  Their chronic conditions include: hypertension, type II diabetes, hyperlipidemia, tobacco use.  Office Visits:  10/06/2018 [name]) - last PCP visit, goal A1c < 7 suggested increase in insulin to 30 units daily in response to constantly elevated BG  Consult Visit:  12/20/2018 [name], [facility]) - patient has chronic pain, no weakness, gets Norco 5/332m  Medications: Outpatient Encounter Medications as of 06/19/2019  Medication Sig   ALPRAZolam (XANAX) 0.25 MG tablet TAKE 2 TABLETS BY MOUTH THREE TIMES DAILY AS NEEDED   B-D ULTRAFINE III SHORT PEN 31G X 8 MM MISC USE TO INJECT INSULIN EVERY DAY   Blood Glucose Monitoring Suppl (ONETOUCH VERIO) w/Device KIT USE TO TEST BLOOD SUGAR LEVELS THREE TIMES DAILY   cetirizine (ZYRTEC) 10 MG tablet Take 10 mg by mouth daily.   diclofenac (VOLTAREN) 75 MG EC tablet Take 75 mg by mouth 2 (two) times daily.   dipyridamole-aspirin (AGGRENOX) 200-25 MG 12hr capsule TAKE 1 CAPSULE BY MOUTH TWICE DAILY (Patient not taking: Reported on 03/28/2019)   donepezil (ARICEPT) 10 MG tablet Take 1 tablet (10 mg total) by mouth at bedtime.   HYDROcodone-acetaminophen (NORCO/VICODIN) 5-325 MG tablet Take 1 tablet by mouth every 6 (six) hours as needed for moderate pain (Must last 30 days).   LANTUS SOLOSTAR 100 UNIT/ML Solostar Pen ADMINISTER 15 UNITS UNDER THE SKIN DAILY (Patient taking differently: 20 Units. ADMINISTER 15 UNITS UNDER THE SKIN DAILY)   losartan (COZAAR) 50 MG tablet TAKE 1 TABLET BY MOUTH EVERY NIGHT AT BEDTIME   memantine (NAMENDA) 10 MG tablet TAKE 1 TABLET(10 MG) BY MOUTH TWICE DAILY   metFORMIN (GLUCOPHAGE) 1000 MG tablet Take 1 tablet (1,000 mg total) by mouth 2 (two) times daily with a meal.    ONETOUCH DELICA LANCETS 353MMISC CHECK BLOOD SUGAR TWICE DAILY TO THREE TIMES DAILY   ONETOUCH VERIO test strip USE TO TEST BLOOD SUGAR 2 TO 3 TIMES DAILY   pioglitazone (ACTOS) 45 MG tablet Take 1 tablet (45 mg total) by mouth at bedtime.   rosuvastatin (CRESTOR) 40 MG tablet TAKE 1 TABLET(40 MG) BY MOUTH DAILY   [DISCONTINUED] rosuvastatin (CRESTOR) 40 MG tablet TAKE 1 TABLET(40 MG) BY MOUTH DAILY   No facility-administered encounter medications on file as of 06/19/2019.     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    Difficulty of Paying Living Expenses: Not very hard     Goals Addressed             This Visit's Progress    Pharmacy Care Plan:       CARE PLAN ENTRY  Current Barriers:  Chronic Disease Management support, education, and care coordination needs related to Hypertension, Hyperlipidemia, and Diabetes   Hypertension Pharmacist Clinical Goal(s): Over the next 180 days, patient will work with PharmD and providers to maintain BP goal <130/80 Current regimen:  Losartan 566mInterventions: Comprehensive medication review Patient self care activities - Over the next 180 days, patient will: Check BP occasionally, document, and provide at future appointments Ensure daily salt intake < 2300 mg/day  Hyperlipidemia Pharmacist Clinical Goal(s): Over the next 180 days, patient will work with PharmD and providers to achieve LDL goal < 100 Current regimen:  Rosuvastatin 4024m  daily Interventions: Comprehensive medication review Patient self care activities - Over the next 180 days, patient will: Continue current therapy  Diabetes Pharmacist Clinical Goal(s): Over the next 180 days, patient will work with PharmD and providers to achieve A1c goal <7% Current regimen:  Lantus 100u/ml 20units daily Pioglitazone 85m daily Metformin 10075mtwice daily with a meal Interventions: Updated medication list to reflect current dose of insulin  to prevent issues with refills at pharmacy Called in prescription for lancets to make more affordable Patient self care activities - Over the next 180 days, patient will: Check blood sugar once daily, document, and provide at future appointments Contact provider with any episodes of hypoglycemia Contact PharmD or PCP when approval for Medicare/medicaid goes through and we can evaluate other medications.  Initial goal documentation         Diabetes   Recent Relevant Labs: Lab Results  Component Value Date/Time   HGBA1C 9.2 (H) 10/06/2018 02:09 PM   HGBA1C 9.5 (H) 01/24/2018 04:51 PM   MICROALBUR 0.6 05/11/2016 09:51 AM   MICROALBUR <0.2 03/15/2015 04:31 PM     Checking BG: Rarely  Recent FBG Readings: reported by caregiver as "all over the place"  Patient is currently uncontrolled on the following medications: Lantus 100u/ml 20 units daily, pioglitazone 4595maily, metformin 1000m63mice daily with a meal  Last diabetic Foot exam: No results found for: HMDIABEYEEXA  Last diabetic Eye exam: No results found for: HMDIABFOOTEX   No logs available at today's appointment, caregiver reports that he does not check on his own unless she does it for him, however she works 9-5.  Going on CAT program to be approved for Medicare/medicaid and can afford medications he could not previously.  Mentioned success with trulicity in the past controlling blood sugars.  Plan  Continue current medications.  Try and check fasting blood sugar daily.  Contact PharmD or PCP when approved for CAT and we can evaluate Trulicity prescription at that time. Hypertension   Office blood pressures are  BP Readings from Last 3 Encounters:  03/28/19 116/69  12/20/18 (!) 142/71  10/06/18 120/60    Patient has failed these meds in the past: none noted  Patient checks BP at home infrequently  Patient home BP readings are ranging: no logs available today  Patient is currently controlled per recent MD  visit on the following medications: losartan 50mg87mlan  Continue current medications.  Contact PharmD or PCP with consistent BP > 130/80.     Hyperlipidemia   Lipid Panel     Component Value Date/Time   CHOL 123 10/06/2018 1409   TRIG 121 10/06/2018 1409   HDL 49 10/06/2018 1409   LDLCALC 54 10/06/2018 1409   LDLDIRECT 109 (H) 04/07/2017 1715     The ASCVD Risk score (Goff DC Jr., et al., 2013) failed to calculate for the following reasons:   The patient has a prior MI or stroke diagnosis   Patient has failed these meds in past: none noted Patient is currently controlled on the following medications: Rosuvastatin 40mg 28my   Plan  Continue current medications.  Vaccines   Reviewed and discussed patient's vaccination history.    Immunization History  Administered Date(s) Administered   Influenza, High Dose Seasonal PF 10/22/2016, 01/24/2018   Influenza,inj,Quad PF,6+ Mos 10/22/2014, 11/07/2015   Influenza-Unspecified 09/12/2013   Pneumococcal Conjugate-13 11/07/2015   Pneumococcal Polysaccharide-23 06/26/2014   Tdap 03/09/2016    Plan  Recommended patient receive Shingles vaccine in office/pharmacy.  Medication Management   Miscellaneous medications: donepezil 57m daily, memantine 145mdaily OTC's: Cetirizine 1052matient currently uses [facility] representativePhone #  [phone] reports using pill box method organized by wife to organize medications and promote adherence. Patient denies missed doses of medication.  Patient's wife is a CNA and will soon be able to stay at home with him as a part of her job.  She will be able to administer his medications, check blood sugar, etc. Which should have a tremendously positive impact on his care.   ChrBeverly MilchharmD Clinical Pharmacist [phone]

## 2019-06-15 ENCOUNTER — Other Ambulatory Visit: Payer: Self-pay | Admitting: Family Medicine

## 2019-06-19 ENCOUNTER — Ambulatory Visit: Payer: PPO | Admitting: Pharmacist

## 2019-06-19 ENCOUNTER — Other Ambulatory Visit: Payer: Self-pay

## 2019-06-19 DIAGNOSIS — E785 Hyperlipidemia, unspecified: Secondary | ICD-10-CM

## 2019-06-19 DIAGNOSIS — I10 Essential (primary) hypertension: Secondary | ICD-10-CM

## 2019-06-19 DIAGNOSIS — E1165 Type 2 diabetes mellitus with hyperglycemia: Secondary | ICD-10-CM

## 2019-06-19 NOTE — Patient Instructions (Addendum)
Thank you for meeting with me today!  I look forward to working with you to help you meet all of your healthcare goals and answer any questions you may have.  Feel free to contact me anytime!   Visit Information  Goals Addressed            This Visit's Progress   . Pharmacy Care Plan:       CARE PLAN ENTRY  Current Barriers:  . Chronic Disease Management support, education, and care coordination needs related to Hypertension, Hyperlipidemia, and Diabetes   Hypertension . Pharmacist Clinical Goal(s): o Over the next 180 days, patient will work with PharmD and providers to maintain BP goal <130/80 . Current regimen:  o Losartan 50mg  . Interventions: o Comprehensive medication review . Patient self care activities - Over the next 180 days, patient will: o Check BP occasionally, document, and provide at future appointments o Ensure daily salt intake < 2300 mg/day  Hyperlipidemia . Pharmacist Clinical Goal(s): o Over the next 180 days, patient will work with PharmD and providers to achieve LDL goal < 100 . Current regimen:  o Rosuvastatin 40mg  daily . Interventions: o Comprehensive medication review . Patient self care activities - Over the next 180 days, patient will: o Continue current therapy  Diabetes . Pharmacist Clinical Goal(s): o Over the next 180 days, patient will work with PharmD and providers to achieve A1c goal <7% . Current regimen:  o Lantus 100u/ml 20units daily o Pioglitazone 45mg  daily o Metformin 1000mg  twice daily with a meal . Interventions: o Updated medication list to reflect current dose of insulin to prevent issues with refills at [place] in prescription for lancets to make more affordable . Patient self care activities - Over the next 180 days, patient will: o Check blood sugar once daily, document, and provide at future appointments o Contact provider with any episodes of hypoglycemia o Contact PharmD or PCP when approval for  Medicare/medicaid goes through and we can evaluate other medications.  Initial goal documentation        [name] [name] was given information about Chronic Care Management services today including:  1. CCM service includes personalized support from designated clinical staff supervised by his physician, including individualized plan of care and coordination with other care providers 2. 24/7 contact phone numbers for assistance for urgent and routine care needs. 3. Standard insurance, coinsurance, copays and deductibles apply for chronic care management only during months in which we provide at least 20 minutes of these services. Most insurances cover these services at 100%, however patients may be responsible for any copay, coinsurance and/or deductible if applicable. This service may help you avoid the need for more expensive face-to-face services. 4. Only one practitioner may furnish and bill the service in a calendar month. 5. The patient may stop CCM services at any time (effective at the end of the month) by phone call to the office staff.  Patient agreed to services and verbal consent obtained.   The patient verbalized understanding of instructions provided today and agreed to receive a mailed copy of patient instruction and/or educational materials. Telephone follow up appointment with pharmacy team member scheduled for: 12/19/2019 @ 12:30 PM  Beverly Milch, PharmD Clinical Pharmacist [facility] [phone]  Diabetes Mellitus and Nutrition, Adult When you have diabetes (diabetes mellitus), it is very important to have healthy eating habits because your blood sugar (glucose) levels are greatly affected by what you eat and drink. Eating healthy foods  in the appropriate amounts, at about the same times every day, can help you:  Control your blood glucose.  Lower your risk of heart disease.  Improve your blood pressure.  Reach or maintain a healthy weight. Every  person with diabetes is different, and each person has different needs for a meal plan. Your health care provider may recommend that you work with a diet and nutrition specialist (dietitian) to make a meal plan that is best for you. Your meal plan may vary depending on factors such as:  The calories you need.  The medicines you take.  Your weight.  Your blood glucose, blood pressure, and cholesterol levels.  Your activity level.  Other health conditions you have, such as heart or kidney disease. How do carbohydrates affect me? Carbohydrates, also called carbs, affect your blood glucose level more than any other type of food. Eating carbs naturally raises the amount of glucose in your blood. Carb counting is a method for keeping track of how many carbs you eat. Counting carbs is important to keep your blood glucose at a healthy level, especially if you use insulin or take certain oral diabetes medicines. It is important to know how many carbs you can safely have in each meal. This is different for every person. Your dietitian can help you calculate how many carbs you should have at each meal and for each snack. Foods that contain carbs include:  Bread, cereal, rice, pasta, and crackers.  Potatoes and corn.  Peas, beans, and lentils.  Milk and yogurt.  Fruit and juice.  Desserts, such as cakes, cookies, ice cream, and candy. How does alcohol affect me? Alcohol can cause a sudden decrease in blood glucose (hypoglycemia), especially if you use insulin or take certain oral diabetes medicines. Hypoglycemia can be a life-threatening condition. Symptoms of hypoglycemia (sleepiness, dizziness, and confusion) are similar to symptoms of having too much alcohol. If your health care provider says that alcohol is safe for you, follow these guidelines:  Limit alcohol intake to no more than 1 drink per day for nonpregnant women and 2 drinks per day for men. One drink equals 12 oz of beer, 5 oz of  wine, or 1 oz of hard liquor.  Do not drink on an empty stomach.  Keep yourself hydrated with water, diet soda, or unsweetened iced tea.  Keep in mind that regular soda, juice, and other mixers may contain a lot of sugar and must be counted as carbs. What are tips for following this plan?  Reading food labels  Start by checking the serving size on the "Nutrition Facts" label of packaged foods and drinks. The amount of calories, carbs, fats, and other nutrients listed on the label is based on one serving of the item. Many items contain more than one serving per package.  Check the total grams (g) of carbs in one serving. You can calculate the number of servings of carbs in one serving by dividing the total carbs by 15. For example, if a food has 30 g of total carbs, it would be equal to 2 servings of carbs.  Check the number of grams (g) of saturated and trans fats in one serving. Choose foods that have low or no amount of these fats.  Check the number of milligrams (mg) of salt (sodium) in one serving. Most people should limit total sodium intake to less than 2,300 mg per day.  Always check the nutrition information of foods labeled as "low-fat" or "nonfat". These foods  may be higher in added sugar or refined carbs and should be avoided.  Talk to your dietitian to identify your daily goals for nutrients listed on the label. Shopping  Avoid buying canned, premade, or processed foods. These foods tend to be high in fat, sodium, and added sugar.  Shop around the outside edge of the grocery store. This includes fresh fruits and vegetables, bulk grains, fresh meats, and fresh dairy. Cooking  Use low-heat cooking methods, such as baking, instead of high-heat cooking methods like deep frying.  Cook using healthy oils, such as olive, canola, or sunflower oil.  Avoid cooking with butter, cream, or high-fat meats. Meal planning  Eat meals and snacks regularly, preferably at the same times  every day. Avoid going long periods of time without eating.  Eat foods high in fiber, such as fresh fruits, vegetables, beans, and whole grains. Talk to your dietitian about how many servings of carbs you can eat at each meal.  Eat 4-6 ounces (oz) of lean protein each day, such as lean meat, chicken, fish, eggs, or tofu. One oz of lean protein is equal to: ? 1 oz of meat, chicken, or fish. ? 1 egg. ?  cup of tofu.  Eat some foods each day that contain healthy fats, such as avocado, nuts, seeds, and fish. Lifestyle  Check your blood glucose regularly.  Exercise regularly as told by your health care provider. This may include: ? 150 minutes of moderate-intensity or vigorous-intensity exercise each week. This could be brisk walking, biking, or water aerobics. ? Stretching and doing strength exercises, such as yoga or weightlifting, at least 2 times a week.  Take medicines as told by your health care provider.  Do not use any products that contain nicotine or tobacco, such as cigarettes and e-cigarettes. If you need help quitting, ask your health care provider.  Work with a Social worker or diabetes educator to identify strategies to manage stress and any emotional and social challenges. Questions to ask a health care provider  Do I need to meet with a diabetes educator?  Do I need to meet with a dietitian?  What number can I call if I have questions?  When are the best times to check my blood glucose? Where to find more information:  American Diabetes Association: diabetes.org  Academy of Nutrition and Dietetics: www.eatright.[employer] of Diabetes and Digestive and Kidney Diseases (NIH): [employer] Summary  A healthy meal plan will help you control your blood glucose and maintain a healthy lifestyle.  Working with a diet and nutrition specialist (dietitian) can help you make a meal plan that is best for you.  Keep in mind that carbohydrates (carbs) and  alcohol have immediate effects on your blood glucose levels. It is important to count carbs and to use alcohol carefully. This information is not intended to replace advice given to you by your health care provider. Make sure you discuss any questions you have with your health care provider. Document Revised: 12/11/2016 Document Reviewed: 02/03/2016 Elsevier Patient Education  2020 [place]. Daily Diabetes Record Check your blood glucose (BG) as directed by your health care provider. Use this form to record your BG results as well as any diabetes medicines that you take, including insulin. Bringing a record of your BG results and a list of your current medicines to your health care provider is very helpful in managing your diabetes. These numbers help your health care provider to know whether your diabetes management plan needs to  be changed. Patient name: ____________________________________ Week of ____________________ Daily BG results and diabetes medicines Date: _________  Breakfast - BG / Medicines: ________________ / __________________________________________________________  Lunch - BG / Medicines: ___________________ / __________________________________________________________  Dinner - BG / Medicines: __________________ / __________________________________________________________  Bedtime - BG / Medicines: ________________ / ___________________________________________________________ Date: _________  Breakfast - BG / Medicines: ________________ / __________________________________________________________  Lunch - BG / Medicines: ___________________ / __________________________________________________________  Dinner - BG / Medicines: __________________ / __________________________________________________________  Bedtime - BG / Medicines: ________________ / ___________________________________________________________ Date: _________  Breakfast - BG / Medicines:  ________________ / __________________________________________________________  Lunch - BG / Medicines: ___________________ / __________________________________________________________  Dinner - BG / Medicines: __________________ / __________________________________________________________  Bedtime - BG / Medicines: ________________ / ___________________________________________________________ Date: _________  Breakfast - BG / Medicines: ________________ / __________________________________________________________  Lunch - BG / Medicines: ___________________ / __________________________________________________________  Dinner - BG / Medicines: __________________ / __________________________________________________________  Bedtime - BG / Medicines: ________________ / ___________________________________________________________ Date: _________  Breakfast - BG / Medicines: ________________ / __________________________________________________________  Lunch - BG / Medicines: ___________________ / __________________________________________________________  Dinner - BG / Medicines: __________________ / __________________________________________________________  Bedtime - BG / Medicines: ________________ / ___________________________________________________________ Date: _________  Breakfast - BG / Medicines: ________________ / __________________________________________________________  Lunch - BG / Medicines: ___________________ / __________________________________________________________  Dinner - BG / Medicines: __________________ / __________________________________________________________  Bedtime - BG / Medicines: ________________ / ___________________________________________________________ Date: _________  Breakfast - BG / Medicines: ________________ / __________________________________________________________  Lunch - BG / Medicines: ___________________ /  __________________________________________________________  Dinner - BG / Medicines: __________________ / __________________________________________________________  Bedtime - BG / Medicines: ________________ / ___________________________________________________________ Notes: ______________________________________________________________________________________________________________________ This information is not intended to replace advice given to you by your health care provider. Make sure you discuss any questions you have with your health care provider. Document Revised: 10/12/2017 Document Reviewed: 09/27/2015 Elsevier Patient Education  [place].

## 2019-06-20 ENCOUNTER — Encounter: Payer: Self-pay | Admitting: Orthopaedic Surgery

## 2019-06-20 ENCOUNTER — Ambulatory Visit: Payer: PPO | Admitting: Orthopaedic Surgery

## 2019-06-20 VITALS — BP 121/66 | HR 78 | Ht 68.0 in | Wt 140.0 lb

## 2019-06-20 DIAGNOSIS — G8929 Other chronic pain: Secondary | ICD-10-CM | POA: Diagnosis not present

## 2019-06-20 DIAGNOSIS — M5441 Lumbago with sciatica, right side: Secondary | ICD-10-CM | POA: Diagnosis not present

## 2019-06-20 DIAGNOSIS — F17210 Nicotine dependence, cigarettes, uncomplicated: Secondary | ICD-10-CM
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
## 2019-06-20 NOTE — Patient Instructions (Signed)
Steps to Quit Smoking Smoking tobacco is the leading cause of preventable death. It can affect almost every organ in the body. Smoking puts you and people around you at risk for many serious, long-lasting (chronic) diseases. Quitting smoking can be hard, but it is one of the best things that you can do for your health. It is never too late to quit. How do I get ready to quit? When you decide to quit smoking, make a plan to help you succeed. Before you quit:  Pick a date to quit. Set a date within the next 2 weeks to give you time to prepare.  Write down the reasons why you are quitting. Keep this list in places where you will see it often.  Tell your family, friends, and co-workers that you are quitting. Their support is important.  Talk with your doctor about the choices that may help you quit.  Find out if your health insurance will pay for these treatments.  Know the people, places, things, and activities that make you want to smoke (triggers). Avoid them. What first steps can I take to quit smoking?  Throw away all cigarettes at home, at work, and in your car.  Throw away the things that you use when you smoke, such as ashtrays and lighters.  Clean your car. Make sure to empty the ashtray.  Clean your home, including curtains and carpets. What can I do to help me quit smoking? Talk with your doctor about taking medicines and seeing a counselor at the same time. You are more likely to succeed when you do both.  If you are pregnant or breastfeeding, talk with your doctor about counseling or other ways to quit smoking. Do not take medicine to help you quit smoking unless your doctor tells you to do so. To quit smoking: Quit right away  Quit smoking totally, instead of slowly cutting back on how much you smoke over a period of time.  Go to counseling. You are more likely to quit if you go to counseling sessions regularly. Take medicine You may take medicines to help you quit. Some  medicines need a prescription, and some you can buy over-the-counter. Some medicines may contain a drug called nicotine to replace the nicotine in cigarettes. Medicines may:  Help you to stop having the desire to smoke (cravings).  Help to stop the problems that come when you stop smoking (withdrawal symptoms). Your doctor may ask you to use:  Nicotine patches, gum, or lozenges.  Nicotine inhalers or sprays.  Non-nicotine medicine that is taken by mouth. Find resources Find resources and other ways to help you quit smoking and remain smoke-free after you quit. These resources are most helpful when you use them often. They include:  Online chats with a counselor.  Phone quitlines.  Printed self-help materials.  Support groups or group counseling.  Text messaging programs.  Mobile phone apps. Use apps on your mobile phone or tablet that can help you stick to your quit plan. There are many free apps for mobile phones and tablets as well as websites. Examples include Quit Guide from the CDC and smokefree.gov  What things can I do to make it easier to quit?   Talk to your family and friends. Ask them to support and encourage you.  Call a phone quitline (1-800-QUIT-NOW), reach out to support groups, or work with a counselor.  Ask people who smoke to not smoke around you.  Avoid places that make you want to smoke,   such as: ? Bars. ? Parties. ? Smoke-break areas at work.  Spend time with people who do not smoke.  Lower the stress in your life. Stress can make you want to smoke. Try these things to help your stress: ? Getting regular exercise. ? Doing deep-breathing exercises. ? Doing yoga. ? Meditating. ? Doing a body scan. To do this, close your eyes, focus on one area of your body at a time from head to toe. Notice which parts of your body are tense. Try to relax the muscles in those areas. How will I feel when I quit smoking? Day 1 to 3 weeks Within the first 24 hours,  you may start to have some problems that come from quitting tobacco. These problems are very bad 2-3 days after you quit, but they do not often last for more than 2-3 weeks. You may get these symptoms:  Mood swings.  Feeling restless, nervous, angry, or annoyed.  Trouble concentrating.  Dizziness.  Strong desire for high-sugar foods and nicotine.  Weight gain.  Trouble pooping (constipation).  Feeling like you may vomit (nausea).  Coughing or a sore throat.  Changes in how the medicines that you take for other issues work in your body.  Depression.  Trouble sleeping (insomnia). Week 3 and afterward After the first 2-3 weeks of quitting, you may start to notice more positive results, such as:  Better sense of smell and taste.  Less coughing and sore throat.  Slower heart rate.  Lower blood pressure.  Clearer skin.  Better breathing.  Fewer sick days. Quitting smoking can be hard. Do not give up if you fail the first time. Some people need to try a few times before they succeed. Do your best to stick to your quit plan, and talk with your doctor if you have any questions or concerns. Summary  Smoking tobacco is the leading cause of preventable death. Quitting smoking can be hard, but it is one of the best things that you can do for your health.  When you decide to quit smoking, make a plan to help you succeed.  Quit smoking right away, not slowly over a period of time.  When you start quitting, seek help from your doctor, family, or friends. This information is not intended to replace advice given to you by your health care provider. Make sure you discuss any questions you have with your health care provider. Document Revised: 09/23/2018 Document Reviewed: 03/19/2018 Elsevier Patient Education  2020 [employer] Inc.  

## 2019-06-21 ENCOUNTER — Telehealth: Payer: Self-pay

## 2019-06-21 ENCOUNTER — Other Ambulatory Visit: Payer: Self-pay

## 2019-06-21 MED ORDER — DICLOFENAC SODIUM 75 MG PO TBEC: 75.0000 mg | DELAYED_RELEASE_TABLET | Freq: Two times a day (BID) | ORAL | 1 refills | Status: DC

## 2019-06-21 MED ORDER — LANTUS SOLOSTAR 100 UNIT/ML ~~LOC~~ SOPN: 20.0000 [IU] | PEN_INJECTOR | Freq: Every day | SUBCUTANEOUS | 5 refills | Status: DC

## 2019-06-21 MED ORDER — ONETOUCH DELICA LANCETS 33G MISC: 0 refills | Status: DC

## 2019-06-21 NOTE — Telephone Encounter (Signed)
Mrs. Cominsky handles [name] [name] medications she said he has not been taking aggernox. They had it taken off at the pharmacy yesterday

## 2019-06-21 NOTE — Telephone Encounter (Signed)
Pt.notified

## 2019-06-21 NOTE — Telephone Encounter (Signed)
I have sent over a refill If he uses , he needs to take diclofenac with food to avoid GI upset I recommend they discuss long term use of this medication with Dr. [name] to make sure he wants them to continue it   If he is not scheduled for OV, please schedule  He is due for labs for his diabetes

## 2019-06-21 NOTE — Telephone Encounter (Signed)
Please contact pt and verify if he is actually y taking He is on aggrenox and this can increase risk of bleeding Dr. [name] has not been filling this

## 2019-06-21 NOTE — Telephone Encounter (Signed)
Last refilled by historical provider. Last office visit: 10/06/2018

## 2019-06-21 NOTE — Telephone Encounter (Signed)
Patient had a pharmacy visit with Gerald Stabs and requested a prescription for voltaren gel. Medication is not on his history in epic. Please advise?

## 2019-06-21 NOTE — Telephone Encounter (Signed)
Please see other medication refill for this pt Voltaren gel 1% is over the counter, does not need prescription , he can use topically 3-4 times a day to affected areas

## 2019-06-29 ENCOUNTER — Other Ambulatory Visit: Payer: Self-pay | Admitting: Family Medicine

## 2019-06-30 NOTE — Telephone Encounter (Signed)
Rx refilled also Dr.[name] instructed Patient needs to be seen.

## 2019-06-30 NOTE — Telephone Encounter (Signed)
NTBS.

## 2019-06-30 NOTE — Telephone Encounter (Signed)
Ok to refill??  Last office visit 10/06/2018.  Last refill 05/01/2019, #1 refill.

## 2019-07-06 ENCOUNTER — Other Ambulatory Visit: Payer: Self-pay | Admitting: *Deleted

## 2019-07-06 DIAGNOSIS — E785 Hyperlipidemia, unspecified: Secondary | ICD-10-CM

## 2019-07-06 DIAGNOSIS — I6529 Occlusion and stenosis of unspecified carotid artery: Secondary | ICD-10-CM

## 2019-07-06 DIAGNOSIS — I10 Essential (primary) hypertension: Secondary | ICD-10-CM

## 2019-07-06 DIAGNOSIS — F0280 Dementia in other diseases classified elsewhere without behavioral disturbance: Secondary | ICD-10-CM

## 2019-07-06 DIAGNOSIS — E119 Type 2 diabetes mellitus without complications: Secondary | ICD-10-CM

## 2019-07-13 ENCOUNTER — Telehealth: Payer: Self-pay | Admitting: Orthopaedic Surgery

## 2019-07-13 MED ORDER — HYDROCODONE-ACETAMINOPHEN 5-325 MG PO TABS: 1.0000 | ORAL_TABLET | Freq: Four times a day (QID) | ORAL | 0 refills | Status: DC | PRN

## 2019-07-13 NOTE — Telephone Encounter (Signed)
Pt requesting refill on HYDROcodone-acetaminophen (NORCO/VICODIN) 5-325 MG tablet. [facility] on [address]

## 2019-07-20 ENCOUNTER — Other Ambulatory Visit: Payer: Self-pay

## 2019-07-20 ENCOUNTER — Ambulatory Visit (INDEPENDENT_AMBULATORY_CARE_PROVIDER_SITE_OTHER): Payer: PPO | Admitting: Family Medicine

## 2019-07-20 VITALS — BP 110/60 | HR 79 | Temp 97.0°F | Ht 70.0 in | Wt 150.0 lb

## 2019-07-20 DIAGNOSIS — E119 Type 2 diabetes mellitus without complications: Secondary | ICD-10-CM

## 2019-07-20 DIAGNOSIS — E785 Hyperlipidemia, unspecified: Secondary | ICD-10-CM | POA: Diagnosis not present

## 2019-07-20 DIAGNOSIS — F0280 Dementia in other diseases classified elsewhere without behavioral disturbance: Secondary | ICD-10-CM | POA: Diagnosis not present

## 2019-07-20 DIAGNOSIS — I10 Essential (primary) hypertension: Secondary | ICD-10-CM | POA: Diagnosis not present

## 2019-07-20 DIAGNOSIS — Z794 Long term (current) use of insulin: Secondary | ICD-10-CM

## 2019-07-20 DIAGNOSIS — I6529 Occlusion and stenosis of unspecified carotid artery: Secondary | ICD-10-CM | POA: Diagnosis not present

## 2019-07-20 MED ORDER — CLOPIDOGREL BISULFATE 75 MG PO TABS: 75.0000 mg | ORAL_TABLET | Freq: Every day | ORAL | 3 refills | Status: DC
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
## 2019-07-21 LAB — COMPLETE METABOLIC PANEL WITH GFR
AG Ratio: 1.7 (calc) (ref 1.0–2.5)
ALT: 16 U/L (ref 9–46)
AST: 18 U/L (ref 10–35)
Albumin: 4.3 g/dL (ref 3.6–5.1)
Alkaline phosphatase (APISO): 60 U/L (ref 35–144)
BUN: 15 mg/dL (ref 7–25)
CO2: 31 mmol/L (ref 20–32)
Calcium: 9.8 mg/dL (ref 8.6–10.3)
Chloride: 102 mmol/L (ref 98–110)
Creat: 0.81 mg/dL (ref 0.70–1.18)
GFR, Est African American: 99 mL/min/{1.73_m2} (ref >=60)
GFR, Est Non African American: 85 mL/min/{1.73_m2} (ref >=60)
Globulin: 2.5 g/dL (calc) (ref 1.9–3.7)
Glucose, Bld: 116 mg/dL — ABNORMAL HIGH (ref 65–99)
Potassium: 4.4 mmol/L (ref 3.5–5.3)
Sodium: 140 mmol/L (ref 135–146)
Total Bilirubin: 0.4 mg/dL (ref 0.2–1.2)
Total Protein: 6.8 g/dL (ref 6.1–8.1)

## 2019-07-21 LAB — LIPID PANEL
Cholesterol: 152 mg/dL (ref <200)
HDL: 54 mg/dL (ref >=40)
LDL Cholesterol (Calc): 78 mg/dL (calc)
Non-HDL Cholesterol (Calc): 98 mg/dL (calc) (ref <130)
Total CHOL/HDL Ratio: 2.8 (calc) (ref <5.0)
Triglycerides: 116 mg/dL (ref <150)

## 2019-07-21 LAB — HEMOGLOBIN A1C
Hgb A1c MFr Bld: 8.7 % of total Hgb — ABNORMAL HIGH (ref <5.7)
Mean Plasma Glucose: 203 (calc)
eAG (mmol/L): 11.2 (calc)

## 2019-07-21 LAB — CBC WITH DIFFERENTIAL/PLATELET
Absolute Monocytes: 760 cells/uL (ref 200–950)
Basophils Absolute: 96 cells/uL (ref 0–200)
Basophils Relative: 0.9 %
Eosinophils Absolute: 214 cells/uL (ref 15–500)
Eosinophils Relative: 2 %
HCT: 41.6 % (ref 38.5–50.0)
Hemoglobin: 13.9 g/dL (ref 13.2–17.1)
Lymphs Abs: 3306 cells/uL (ref 850–3900)
MCH: 32 pg (ref 27.0–33.0)
MCHC: 33.4 g/dL (ref 32.0–36.0)
MCV: 95.6 fL (ref 80.0–100.0)
MPV: 10.6 fL (ref 7.5–12.5)
Monocytes Relative: 7.1 %
Neutro Abs: 6324 cells/uL (ref 1500–7800)
Neutrophils Relative %: 59.1 %
Platelets: 222 10*3/uL (ref 140–400)
RBC: 4.35 10*6/uL (ref 4.20–5.80)
RDW: 12.4 % (ref 11.0–15.0)
Total Lymphocyte: 30.9 %
WBC: 10.7 10*3/uL (ref 3.8–10.8)

## 2019-07-31 ENCOUNTER — Telehealth: Payer: Self-pay | Admitting: *Deleted

## 2019-07-31 NOTE — Telephone Encounter (Signed)
Received VM with patient blood sugar results as follows:  CBG  9-Jul 105  10-Jul 101  11-Jul 83  12-Jul 111  13-Jul 124  14-Jul 99  15-Jul 124

## 2019-08-01 NOTE — Telephone Encounter (Signed)
Call placed to patient and patient wife [name] made aware.

## 2019-08-01 NOTE — Telephone Encounter (Signed)
These are excellent.  No changes are needed at the present time.

## 2019-08-08 ENCOUNTER — Other Ambulatory Visit: Payer: Self-pay

## 2019-08-14 ENCOUNTER — Telehealth: Payer: Self-pay | Admitting: Orthopaedic Surgery

## 2019-08-14 MED ORDER — HYDROCODONE-ACETAMINOPHEN 5-325 MG PO TABS: 1.0000 | ORAL_TABLET | Freq: Four times a day (QID) | ORAL | 0 refills | Status: DC | PRN

## 2019-08-14 NOTE — Telephone Encounter (Signed)
Patient(spouse, designated party contact) called for patient's refill: HYDROcodone-acetaminophen (NORCO/VICODIN) 5-325 MG tablet 90 tablet  -[employer] [facility], [address], [place]

## 2019-08-28 ENCOUNTER — Other Ambulatory Visit: Payer: Self-pay | Admitting: Family Medicine

## 2019-09-12 ENCOUNTER — Other Ambulatory Visit: Payer: Self-pay | Admitting: Family Medicine

## 2019-09-15 ENCOUNTER — Telehealth: Payer: Self-pay | Admitting: Orthopaedic Surgery

## 2019-09-15 ENCOUNTER — Other Ambulatory Visit: Payer: Self-pay

## 2019-09-15 MED ORDER — HYDROCODONE-ACETAMINOPHEN 5-325 MG PO TABS: 1.0000 | ORAL_TABLET | Freq: Four times a day (QID) | ORAL | 0 refills | Status: DC | PRN

## 2019-09-15 NOTE — Telephone Encounter (Signed)
Requested Prescriptions   Pending Prescriptions Disp Refills  . HYDROcodone-acetaminophen (NORCO/VICODIN) 5-325 MG tablet 90 tablet 0    Sig: Take 1 tablet by mouth every 6 (six) hours as needed for moderate pain (Must last 30 days).     Last OV 07/20/2019   Last written 08/14/2019  Pt's wife called and stated that Dr. [name] is out of the office today and they advise wife to call pcp this one time to see if you would fill it.

## 2019-09-15 NOTE — Telephone Encounter (Signed)
Patient's wife called and stated that he requested this medication on Wednesday but this was not done.  Patient is requesting refill on Hydrocodone/Acetaminophen 5-325  Mgs.  Qty  90  Sig: Take 1 tablet by mouth every 6 (six) hours as needed for moderate pain (Must last 30 days).  Patient states he uses [facility] on [address]

## 2019-09-16 ENCOUNTER — Other Ambulatory Visit: Payer: Self-pay | Admitting: Family Medicine

## 2019-09-18 MED ORDER — HYDROCODONE-ACETAMINOPHEN 5-325 MG PO TABS
1.0000 | ORAL_TABLET | Freq: Four times a day (QID) | ORAL | 0 refills | Status: DC | PRN
Start: 2019-09-18 — End: 2019-10-12

## 2019-09-19 ENCOUNTER — Encounter: Payer: Self-pay | Admitting: Orthopaedic Surgery

## 2019-09-19 ENCOUNTER — Other Ambulatory Visit: Payer: Self-pay

## 2019-09-19 ENCOUNTER — Ambulatory Visit (INDEPENDENT_AMBULATORY_CARE_PROVIDER_SITE_OTHER): Payer: PPO | Admitting: Orthopaedic Surgery

## 2019-09-19 VITALS — BP 141/58 | HR 79 | Ht 69.0 in | Wt 151.0 lb

## 2019-09-19 DIAGNOSIS — F17210 Nicotine dependence, cigarettes, uncomplicated: Secondary | ICD-10-CM

## 2019-09-19 DIAGNOSIS — G8929 Other chronic pain: Secondary | ICD-10-CM | POA: Diagnosis not present

## 2019-09-19 DIAGNOSIS — M5441 Lumbago with sciatica, right side: Secondary | ICD-10-CM
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
## 2019-09-27 ENCOUNTER — Telehealth: Payer: Self-pay | Admitting: Pharmacist

## 2019-09-27 ENCOUNTER — Other Ambulatory Visit: Payer: Self-pay | Admitting: Family Medicine

## 2019-09-27 NOTE — Progress Notes (Addendum)
Chronic Care Management Pharmacy Assistant   Name: KIN GALBRAITH  MRN: [id number] DOB: May 04, 1940  Reason for Encounter: Disease State  Patient Questions:  1.  Have you seen any other providers since your last visit? Yes, 09/07/21Patrick Jupiter, [name], MD (Ortho/Sports)  2.  Any changes in your medicines or health? Yes, uncontrolled diabetes, Resume Plavix 75 MG daily, Limit Xanax use,   Have been increasing his insulin to see what is successful. Patient currently on 36 units and it has been working well for  patient per [employer].    PCP : [name], MD  Allergies:   Allergies  Allergen Reactions   Invokana [Canagliflozin] Palpitations, Rash and Other (See Comments)    Rash, tachycardia,weight loss     Medications: Outpatient Encounter Medications as of 09/27/2019  Medication Sig   ALPRAZolam (XANAX) 0.25 MG tablet TAKE 2 TABLETS BY MOUTH THREE TIMES DAILY AS NEEDED   B-D ULTRAFINE III SHORT PEN 31G X 8 MM MISC USE TO INJECT INSULIN EVERY DAY   Blood Glucose Monitoring Suppl (ONETOUCH VERIO) w/Device KIT USE TO TEST BLOOD SUGAR LEVELS THREE TIMES DAILY   cetirizine (ZYRTEC) 10 MG tablet Take 10 mg by mouth daily.   clopidogrel (PLAVIX) 75 MG tablet Take 1 tablet (75 mg total) by mouth daily. (Patient not taking: Reported on 09/19/2019)   diclofenac (VOLTAREN) 75 MG EC tablet Take 1 tablet (75 mg total) by mouth 2 (two) times daily.   donepezil (ARICEPT) 10 MG tablet Take 1 tablet (10 mg total) by mouth at bedtime.   HYDROcodone-acetaminophen (NORCO/VICODIN) 5-325 MG tablet Take 1 tablet by mouth every 6 (six) hours as needed for moderate pain (Must last 30 days).   insulin glargine (LANTUS SOLOSTAR) 100 UNIT/ML Solostar Pen Inject 20 Units into the skin daily.   losartan (COZAAR) 50 MG tablet TAKE 1 TABLET BY MOUTH EVERY NIGHT AT BEDTIME   memantine (NAMENDA) 10 MG tablet TAKE 1 TABLET(10 MG) BY MOUTH TWICE DAILY   metFORMIN (GLUCOPHAGE) 1000 MG tablet TAKE 1 TABLET(1000  MG) BY MOUTH TWICE DAILY WITH A MEAL   OneTouch Delica Lancets 01S MISC CHECK BLOOD SUGAR TWICE DAILY TO THREE TIMES DAILY   ONETOUCH VERIO test strip USE TO TEST BLOOD SUGAR 2 TO 3 TIMES DAILY   pioglitazone (ACTOS) 45 MG tablet TAKE 1 TABLET(45 MG) BY MOUTH AT BEDTIME   rosuvastatin (CRESTOR) 40 MG tablet TAKE 1 TABLET(40 MG) BY MOUTH DAILY   Vitamin D, Ergocalciferol, (DRISDOL) 1.25 MG (50000 UNIT) CAPS capsule Take 50,000 Units by mouth every 7 (seven) days.   No facility-administered encounter medications on file as of 09/27/2019.    Current Diagnosis: Patient Active Problem List   Diagnosis Date Noted   Carotid stenosis 06/15/2016   Carotid disease, bilateral ([place]) 04/12/2015   History of colonic polyps    Iron deficiency anemia 12/21/2013   Smoker 12/21/2013   Anxiety    Diabetes mellitus ([place])    Diabetes mellitus type 2, uncomplicated ([place])    Hyperlipemia    Hypertension    Cellulitis of foot, left 08/09/2010    Goals Addressed   None     Follow-Up:  Pharmacist Review    Recent Relevant Labs: Lab Results  Component Value Date/Time   HGBA1C 8.7 (H) 07/20/2019 12:41 PM   HGBA1C 9.2 (H) 10/06/2018 02:09 PM   MICROALBUR 0.6 05/11/2016 09:51 AM   MICROALBUR <0.2 03/15/2015 04:31 PM    Kidney Function Lab Results  Component Value Date/Time  CREATININE 0.81 07/20/2019 12:41 PM   CREATININE 0.83 10/06/2018 02:09 PM   GFRNONAA 85 07/20/2019 12:41 PM   GFRAA 99 07/20/2019 12:41 PM    Current antihyperglycemic regimen:  Lantus Solostar 100 Units/ML, Metformin 1000 MG, Actos 45 MG What recent interventions/DTPs have been made to improve glycemic control: Increase Insulin (Lantus Solostar)  Have there been any recent hospitalizations or ED visits since last visit with CPP? No Patient denies hypoglycemic symptoms, including None Patient denies hyperglycemic symptoms, including none How often are you checking your blood sugar? once daily, in the morning before  taking insulin. What are your blood sugars ranging? Per Wife/caregiver, daily averages are 84-136, weekly average 112 During the week, how often does your blood glucose drop below 70? Never. Are you checking your feet daily/regularly? Wife checks his feet daily while patient takes a bath.  Adherence Review: Is the patient currently on a STATIN medication? Yes, rosuvastatin 7m  Diet: Patient usually has 2 eggs, grits, and coffee for breakfast. Patient has a small sandwich for lunch. Patient eats meat (chicken, fish pork, sometimes beef) daily but does not eat any fried foods. Patient's wife usually broils, bakes, or roasts their meat. Patient's wife states patient eats fruits regularly (peaches, oranges, etc.) and especially bananas even though he is not supposed to have them due to diabetes, but they are his favorite. Patient and his wife have a garden and eat fresh green vegetables and corn from the garden regularly.They do not eat canned vegetables. Patient also eats potatoes often. Wife states patient was excessively drinking sugar-free energy drinks, but has gotten him to limit his intake from 5 or 6 drinks daily to 1 or 2 a week. Patient also drinks his diabetic boosts and water regularly.  Exercise: Patient and his wife walk to the park daily to walk around the track. They usually walk 1.5 miles a day.  YMaren Reamer CFairfield HarbourPharmacist Assistant [phone]

## 2019-09-28 ENCOUNTER — Other Ambulatory Visit: Payer: Self-pay | Admitting: Family Medicine

## 2019-09-28 NOTE — Telephone Encounter (Signed)
Ok to refill??  Last office visit 07/20/2019.  Last refill 08/29/2019.

## 2019-09-29 ENCOUNTER — Other Ambulatory Visit: Payer: Self-pay | Admitting: Family Medicine

## 2019-10-12 ENCOUNTER — Telehealth: Payer: Self-pay | Admitting: Orthopaedic Surgery

## 2019-10-12 MED ORDER — HYDROCODONE-ACETAMINOPHEN 5-325 MG PO TABS
1.0000 | ORAL_TABLET | Freq: Four times a day (QID) | ORAL | 0 refills | Status: DC | PRN
Start: 2019-10-12 — End: 2019-11-09

## 2019-10-12 NOTE — Telephone Encounter (Signed)
Patient is requesting refill on Hydrocodone/Acetaminophen 5-325 Mgs. Qty 90  Sig:Take 1 tablet by mouth every 6 (six) hours as needed for moderate pain (Must last 30 days).  Patient states he uses [facility] on [address] 

## 2019-10-29 ENCOUNTER — Other Ambulatory Visit: Payer: Self-pay | Admitting: Family Medicine

## 2019-10-30 NOTE — Telephone Encounter (Signed)
Ok to refill??  Last office visit 07/20/2019.  Last refill 09/28/2019.

## 2019-11-02 DIAGNOSIS — E119 Type 2 diabetes mellitus without complications: Secondary | ICD-10-CM | POA: Diagnosis not present

## 2019-11-02 LAB — HM DIABETES EYE EXAM

## 2019-11-09 ENCOUNTER — Other Ambulatory Visit: Payer: Self-pay | Admitting: Family Medicine

## 2019-11-09 ENCOUNTER — Telehealth: Payer: Self-pay | Admitting: Orthopaedic Surgery

## 2019-11-09 MED ORDER — HYDROCODONE-ACETAMINOPHEN 5-325 MG PO TABS
1.0000 | ORAL_TABLET | Freq: Four times a day (QID) | ORAL | 0 refills | Status: DC | PRN
Start: 2019-11-09 — End: 2019-12-13

## 2019-11-09 NOTE — Telephone Encounter (Signed)
Patient called for refill: HYDROcodone-acetaminophen (NORCO/VICODIN) 5-325 MG tablet 90 tablet  -[facility], [address], [place]

## 2019-11-10 NOTE — Telephone Encounter (Signed)
Also need refill on his Lancets along with the strips

## 2019-11-13 MED ORDER — ONETOUCH DELICA PLUS LANCET33G MISC: 0 refills | Status: DC

## 2019-11-13 NOTE — Addendum Note (Signed)
Addended by: Sheral Flow on: 11/13/2019 08:57 AM   Modules accepted: Orders

## 2019-11-30 ENCOUNTER — Other Ambulatory Visit: Payer: Self-pay | Admitting: Family Medicine

## 2019-11-30 NOTE — Telephone Encounter (Signed)
Ok to refill??  Last office visit 07/20/2019.  Last refill 10/30/2019.

## 2019-12-04 ENCOUNTER — Other Ambulatory Visit: Payer: Self-pay | Admitting: Family Medicine

## 2019-12-13 ENCOUNTER — Telehealth: Payer: Self-pay | Admitting: Orthopaedic Surgery

## 2019-12-13 MED ORDER — HYDROCODONE-ACETAMINOPHEN 5-325 MG PO TABS: 1.0000 | ORAL_TABLET | Freq: Four times a day (QID) | ORAL | 0 refills | Status: DC | PRN

## 2019-12-13 NOTE — Telephone Encounter (Signed)
Patient is requesting refill on Hydrocodone/Acetaminophen 5-325 Mgs. Qty 90  ELM:RAJH 1 tablet by mouth every 6 (six) hours as needed for moderate pain (Must last 30 days).  Patient states he uses [facility] on [address]

## 2019-12-14 NOTE — Chronic Care Management (AMB) (Addendum)
Chronic Care Management Pharmacy  Name: [name] [name]  MRN: [id number] DOB: 06/03/1940  Chief Complaint/ HPI  [name] [name],  79 y.o. , male presents for their Follow-Up CCM visit with the clinical pharmacist via telephone.  PCP : [name] Frizzle, MD  Their chronic conditions include: hypertension, type II diabetes, hyperlipidemia, tobacco use.  Office Visits:  10/06/2018 [name] [name]) - last PCP visit, goal A1c < 7 suggested increase in insulin to 30 units daily in response to constantly elevated BG  Consult Visit:  12/20/2018 [name] [name], Ortho) - patient has chronic pain, no weakness, gets Norco 5/316m  Medications: Outpatient Encounter Medications as of 12/19/2019  Medication Sig   ALPRAZolam (XANAX) 0.25 MG tablet TAKE 2 TABLETS BY MOUTH THREE TIMES DAILY AS NEEDED   B-D ULTRAFINE III SHORT PEN 31G X 8 MM MISC USE TO INJECT INSULIN EVERY DAY   Blood Glucose Monitoring Suppl (ONETOUCH VERIO) w/Device KIT USE TO TEST BLOOD SUGAR LEVELS THREE TIMES DAILY   cetirizine (ZYRTEC) 10 MG tablet Take 10 mg by mouth daily.   donepezil (ARICEPT) 10 MG tablet Take 1 tablet (10 mg total) by mouth at bedtime.   HYDROcodone-acetaminophen (NORCO/VICODIN) 5-325 MG tablet Take 1 tablet by mouth every 6 (six) hours as needed for moderate pain (Must last 30 days).   Lancets (ONETOUCH DELICA PLUS [id number] MISC USE TO CHECK BLOOD SUGAR TWICE DAILY. Dx: E11.9.   LANTUS SOLOSTAR 100 UNIT/ML Solostar Pen ADMINISTER 15 UNITS UNDER THE SKIN DAILY (Patient taking differently: 50 Units. )   metFORMIN (GLUCOPHAGE) 1000 MG tablet TAKE 1 TABLET(1000 MG) BY MOUTH TWICE DAILY WITH A MEAL   pioglitazone (ACTOS) 45 MG tablet TAKE 1 TABLET(45 MG) BY MOUTH AT BEDTIME   losartan (COZAAR) 50 MG tablet TAKE 1 TABLET BY MOUTH EVERY NIGHT AT BEDTIME   memantine (NAMENDA) 10 MG tablet TAKE 1 TABLET(10 MG) BY MOUTH TWICE DAILY   ONETOUCH VERIO test strip USE TO TEST BLOOD SUGAR 2 TO 3 TIMES DAILY   rosuvastatin (CRESTOR)  40 MG tablet TAKE 1 TABLET(40 MG) BY MOUTH DAILY   Vitamin D, Ergocalciferol, (DRISDOL) 1.25 MG (50000 UNIT) CAPS capsule Take 50,000 Units by mouth every 7 (seven) days.   [DISCONTINUED] clopidogrel (PLAVIX) 75 MG tablet Take 1 tablet (75 mg total) by mouth daily. (Patient not taking: Reported on 12/19/2019)   [DISCONTINUED] diclofenac (VOLTAREN) 75 MG EC tablet Take 1 tablet (75 mg total) by mouth 2 (two) times daily.   No facility-administered encounter medications on file as of 12/19/2019.     Current Diagnosis/Assessment:    [name] [name]    Difficulty of Paying Living Expenses: Not very hard     Goals Addressed             This Visit's Progress    Pharmacy Care Plan:       CARE PLAN ENTRY  Current Barriers:  Chronic Disease Management support, education, and care coordination needs related to Hypertension, Hyperlipidemia, and Diabetes   Hypertension Pharmacist Clinical Goal(s): Over the next 180 days, patient will work with PharmD and providers to maintain BP goal <130/80 Current regimen:  Losartan 515mInterventions: Comprehensive medication review Patient self care activities - Over the next 180 days, patient will: Check BP occasionally, document, and provide at future appointments Ensure daily salt intake < 2300 mg/day  Hyperlipidemia Pharmacist Clinical Goal(s): Over the next 180 days, patient will work with PharmD and providers to achieve LDL goal < 100 Current regimen:  Rosuvastatin  75m daily Interventions: Comprehensive medication review Patient self care activities - Over the next 180 days, patient will: Continue current therapy  Diabetes Pharmacist Clinical Goal(s): Over the next 60 days, patient will work with PharmD and providers to achieve A1c goal <7% Current regimen:  Lantus 100u/ml50 units daily Pioglitazone 443mdaily Metformin 100073mwice daily with a meal Interventions: Updated medication list to reflect current  dose of insulin to prevent issues with refills at pharmacy Counseled on importance of eating snack before bedtime that consists of protein and a small carb source Patient self care activities - Over the next 60 days, patient will: Check blood sugar once daily and twice daily, document, and provide at future appointments Contact provider with any episodes of hypoglycemia Remember to eat snack before bedtime consisting of protein and carb source. Work on eliminating or limiting sugary substances such as cookies.  Please see past updates related to this goal by clicking on the "Past Updates" button in the selected goal          Diabetes   Recent Relevant Labs: Lab Results  Component Value Date/Time   HGBA1C 8.7 (H) 07/20/2019 12:41 PM   HGBA1C 9.2 (H) 10/06/2018 02:09 PM   MICROALBUR 0.6 05/11/2016 09:51 AM   MICROALBUR <0.2 03/15/2015 04:31 PM     Checking BG: Rarely  Recent FBG Readings: No specific logs today, reports they have ranged anywhere from 60s to in the 600s once the other day.  Most FBG < 200.  Patient is currently uncontrolled on the following medications:  Lantus 100u/ml 50 units daily - in the morning Pioglitazone 54m79mily Metformin 1000mg62mce daily with a meal  Last diabetic Foot exam: No results found for: HMDIABEYEEXA  Last diabetic Eye exam: No results found for: HMDIABFOOTEX   We discussed: He reports when his sugar was low, this was due to eating early and not eating a snack before bed.  This caused him to wake sweating and clammy.  Counseled on importance of eating a snack before bedtime due to being on multiple diabetic medications and a long acting insulin.  Patient understands the importance of this and has not experienced many hypoglycemic episodes since this.  Counseled him to contact us ifKoreahis happens again.   Hyperglycemia was due to medication non-compliance and increased sugar intake.  Patient admits this only happened on time and it was  because of something he ate, does not remember what. Medications are now very affordable as patient is on medicare and medicaid.  His wife now takes care of him at home as part of her job.   Plan  Continue current medications.   Recommend A1c ASAP. CPA to follow up blood sugars monthly. Hypertension   Office blood pressures are  BP Readings from Last 3 Encounters:  12/19/19 109/63  09/19/19 (!) 141/58  07/20/19 110/60    Patient has failed these meds in the past: none noted  Patient checks BP at home infrequently  Patient home BP readings are ranging: no logs available today  Patient is currently controlled per recent MD visit on the following medications:  Losartan 50mg 54mdiscussed: BP controlled at orthopedic today. Patient reports feeling good, denies dizziness or headaches.  Plan  Continue current medications.  Contact PharmD or PCP with consistent BP > 130/80.     Hyperlipidemia   Lipid Panel     Component Value Date/Time   CHOL 152 07/20/2019 1241   TRIG 116 07/20/2019 1241   HDL 54  07/20/2019 1241   LDLCALC 78 07/20/2019 1241   LDLDIRECT 109 (H) 04/07/2017 1715     The ASCVD [name] score Mikey Bussing DC Jr., et al., 2013) failed to calculate for the following reasons:   The patient has a prior MI or stroke diagnosis   Patient has failed these meds in past: none noted Patient is currently controlled on the following medications: Rosuvastatin 75m daily   Plan  Continue current medications.  Vaccines   Reviewed and discussed patient's vaccination history.    Immunization History  Administered Date(s) Administered   Influenza, High Dose Seasonal PF 10/22/2016, 01/24/2018   Influenza,inj,Quad PF,6+ Mos 10/22/2014, 11/07/2015   Influenza-Unspecified 09/12/2013   Pneumococcal Conjugate-13 11/07/2015   Pneumococcal Polysaccharide-23 06/26/2014   Tdap 03/09/2016    Plan  Recommended patient receive Shingles vaccine in office/pharmacy.   Medication  Management   Miscellaneous medications: donepezil 151mdaily, memantine 1016maily OTC's: Cetirizine 27m71mtient currently [facility] #  ([phone] reports using pill box method organized by wife to organize medications and promote adherence. Patient denies missed doses of medication.     ChriBeverly MilcharmD Clinical Pharmacist [phone] collaborated with the care management provider regarding care management and care coordination activities outlined in this encounter and have reviewed this encounter including documentation in the note and care plan. I am certifying that I agree with the content of this note and encounter as supervising physician.

## 2019-12-19 ENCOUNTER — Ambulatory Visit (INDEPENDENT_AMBULATORY_CARE_PROVIDER_SITE_OTHER): Payer: PPO | Admitting: Orthopaedic Surgery

## 2019-12-19 ENCOUNTER — Ambulatory Visit: Payer: Self-pay | Admitting: Pharmacist

## 2019-12-19 ENCOUNTER — Other Ambulatory Visit: Payer: Self-pay

## 2019-12-19 ENCOUNTER — Encounter: Payer: Self-pay | Admitting: Orthopaedic Surgery

## 2019-12-19 VITALS — BP 109/63 | HR 78 | Ht 72.0 in | Wt 149.0 lb

## 2019-12-19 DIAGNOSIS — G8929 Other chronic pain: Secondary | ICD-10-CM

## 2019-12-19 DIAGNOSIS — M5441 Lumbago with sciatica, right side: Secondary | ICD-10-CM | POA: Diagnosis not present

## 2019-12-19 DIAGNOSIS — F17210 Nicotine dependence, cigarettes, uncomplicated: Secondary | ICD-10-CM

## 2019-12-19 NOTE — Patient Instructions (Addendum)
Visit Information  Goals Addressed            This Visit's Progress   . Pharmacy Care Plan:       CARE PLAN ENTRY  Current Barriers:  . Chronic Disease Management support, education, and care coordination needs related to Hypertension, Hyperlipidemia, and Diabetes   Hypertension . Pharmacist Clinical Goal(s): o Over the next 180 days, patient will work with PharmD and providers to maintain BP goal <130/80 . Current regimen:  o Losartan 50mg  . Interventions: o Comprehensive medication review . Patient self care activities - Over the next 180 days, patient will: o Check BP occasionally, document, and provide at future appointments o Ensure daily salt intake < 2300 mg/day  Hyperlipidemia . Pharmacist Clinical Goal(s): o Over the next 180 days, patient will work with PharmD and providers to achieve LDL goal < 100 . Current regimen:  o Rosuvastatin 40mg  daily . Interventions: o Comprehensive medication review . Patient self care activities - Over the next 180 days, patient will: o Continue current therapy  Diabetes . Pharmacist Clinical Goal(s): o Over the next 60 days, patient will work with PharmD and providers to achieve A1c goal <7% . Current regimen:  o Lantus 100u/ml50 units daily o Pioglitazone 45mg  daily o Metformin 1000mg  twice daily with a meal . Interventions: o Updated medication list to reflect current dose of insulin to prevent issues with refills at [place] on importance of eating snack before bedtime that consists of protein and a small carb source . Patient self care activities - Over the next 60 days, patient will: o Check blood sugar once daily and twice daily, document, and provide at future appointments o Contact provider with any episodes of hypoglycemia o Remember to eat snack before bedtime consisting of protein and carb source. o Work on eliminating or limiting sugary substances such as cookies.  Please see past updates related to  this goal by clicking on the "Past Updates" button in the selected goal         The patient verbalized understanding of instructions, educational materials, and care plan provided today and agreed to receive a mailed copy of patient instructions, educational materials, and care plan.   Telephone follow up appointment with pharmacy team member scheduled for: 2 months  Beverly Milch, PharmD Clinical Pharmacist [facility] [phone]   Diabetes Mellitus and Nutrition, Adult When you have diabetes (diabetes mellitus), it is very important to have healthy eating habits because your blood sugar (glucose) levels are greatly affected by what you eat and drink. Eating healthy foods in the appropriate amounts, at about the same times every day, can help you:  Control your blood glucose.  Lower your risk of heart disease.  Improve your blood pressure.  Reach or maintain a healthy weight. Every person with diabetes is different, and each person has different needs for a meal plan. Your health care provider may recommend that you work with a diet and nutrition specialist (dietitian) to make a meal plan that is best for you. Your meal plan may vary depending on factors such as:  The calories you need.  The medicines you take.  Your weight.  Your blood glucose, blood pressure, and cholesterol levels.  Your activity level.  Other health conditions you have, such as heart or kidney disease. How do carbohydrates affect me? Carbohydrates, also called carbs, affect your blood glucose level more than any other type of food. Eating carbs naturally raises the amount of  glucose in your blood. Carb counting is a method for keeping track of how many carbs you eat. Counting carbs is important to keep your blood glucose at a healthy level, especially if you use insulin or take certain oral diabetes medicines. It is important to know how many carbs you can safely have in each meal.  This is different for every person. Your dietitian can help you calculate how many carbs you should have at each meal and for each snack. Foods that contain carbs include:  Bread, cereal, rice, pasta, and crackers.  Potatoes and corn.  Peas, beans, and lentils.  Milk and yogurt.  Fruit and juice.  Desserts, such as cakes, cookies, ice cream, and candy. How does alcohol affect me? Alcohol can cause a sudden decrease in blood glucose (hypoglycemia), especially if you use insulin or take certain oral diabetes medicines. Hypoglycemia can be a life-threatening condition. Symptoms of hypoglycemia (sleepiness, dizziness, and confusion) are similar to symptoms of having too much alcohol. If your health care provider says that alcohol is safe for you, follow these guidelines:  Limit alcohol intake to no more than 1 drink per day for nonpregnant women and 2 drinks per day for men. One drink equals 12 oz of beer, 5 oz of wine, or 1 oz of hard liquor.  Do not drink on an empty stomach.  Keep yourself hydrated with water, diet soda, or unsweetened iced tea.  Keep in mind that regular soda, juice, and other mixers may contain a lot of sugar and must be counted as carbs. What are tips for following this plan?  Reading food labels  Start by checking the serving size on the "Nutrition Facts" label of packaged foods and drinks. The amount of calories, carbs, fats, and other nutrients listed on the label is based on one serving of the item. Many items contain more than one serving per package.  Check the total grams (g) of carbs in one serving. You can calculate the number of servings of carbs in one serving by dividing the total carbs by 15. For example, if a food has 30 g of total carbs, it would be equal to 2 servings of carbs.  Check the number of grams (g) of saturated and trans fats in one serving. Choose foods that have low or no amount of these fats.  Check the number of milligrams (mg) of  salt (sodium) in one serving. Most people should limit total sodium intake to less than 2,300 mg per day.  Always check the nutrition information of foods labeled as "low-fat" or "nonfat". These foods may be higher in added sugar or refined carbs and should be avoided.  Talk to your dietitian to identify your daily goals for nutrients listed on the label. Shopping  Avoid buying canned, premade, or processed foods. These foods tend to be high in fat, sodium, and added sugar.  Shop around the outside edge of the grocery store. This includes fresh fruits and vegetables, bulk grains, fresh meats, and fresh dairy. Cooking  Use low-heat cooking methods, such as baking, instead of high-heat cooking methods like deep frying.  Cook using healthy oils, such as olive, canola, or sunflower oil.  Avoid cooking with butter, cream, or high-fat meats. Meal planning  Eat meals and snacks regularly, preferably at the same times every day. Avoid going long periods of time without eating.  Eat foods high in fiber, such as fresh fruits, vegetables, beans, and whole grains. Talk to your dietitian about how many  servings of carbs you can eat at each meal.  Eat 4-6 ounces (oz) of lean protein each day, such as lean meat, chicken, fish, eggs, or tofu. One oz of lean protein is equal to: ? 1 oz of meat, chicken, or fish. ? 1 egg. ?  cup of tofu.  Eat some foods each day that contain healthy fats, such as avocado, nuts, seeds, and fish. Lifestyle  Check your blood glucose regularly.  Exercise regularly as told by your health care provider. This may include: ? 150 minutes of moderate-intensity or vigorous-intensity exercise each week. This could be brisk walking, biking, or water aerobics. ? Stretching and doing strength exercises, such as yoga or weightlifting, at least 2 times a week.  Take medicines as told by your health care provider.  Do not use any products that contain nicotine or tobacco, such  as cigarettes and e-cigarettes. If you need help quitting, ask your health care provider.  Work with a Social worker or diabetes educator to identify strategies to manage stress and any emotional and social challenges. Questions to ask a health care provider  Do I need to meet with a diabetes educator?  Do I need to meet with a dietitian?  What number can I call if I have questions?  When are the best times to check my blood glucose? Where to find more information:  American Diabetes Association: diabetes.org  Academy of Nutrition and Dietetics: www.eatright.[employer] of Diabetes and Digestive and Kidney Diseases (NIH): [employer] Summary  A healthy meal plan will help you control your blood glucose and maintain a healthy lifestyle.  Working with a diet and nutrition specialist (dietitian) can help you make a meal plan that is best for you.  Keep in mind that carbohydrates (carbs) and alcohol have immediate effects on your blood glucose levels. It is important to count carbs and to use alcohol carefully. This information is not intended to replace advice given to you by your health care provider. Make sure you discuss any questions you have with your health care provider. Document Revised: 12/11/2016 Document Reviewed: 02/03/2016 Elsevier Patient Education  2020 [employer].

## 2019-12-19 NOTE — Patient Instructions (Signed)
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
## 2019-12-26 ENCOUNTER — Other Ambulatory Visit: Payer: Self-pay | Admitting: Family Medicine

## 2019-12-26 DIAGNOSIS — E119 Type 2 diabetes mellitus without complications: Secondary | ICD-10-CM

## 2019-12-30 ENCOUNTER — Other Ambulatory Visit: Payer: Self-pay | Admitting: Family Medicine

## 2020-01-01 NOTE — Telephone Encounter (Signed)
Ok to refill??  Last office visit 07/20/2019.  Last refill 11/30/2019.

## 2020-01-11 ENCOUNTER — Other Ambulatory Visit: Payer: Self-pay | Admitting: Orthopaedic Surgery

## 2020-01-11 MED ORDER — HYDROCODONE-ACETAMINOPHEN 5-325 MG PO TABS: 1.0000 | ORAL_TABLET | Freq: Four times a day (QID) | ORAL | 0 refills | Status: DC | PRN

## 2020-01-11 NOTE — Telephone Encounter (Signed)
Patient is requesting refill on Hydrocodone/Acetaminophen 5-325 Mgs. Qty 90  Sig:Take 1 tablet by mouth every 6 (six) hours as needed for moderate pain (Must last 30 days).  Patient states he uses [facility] on [address] 

## 2020-01-24 ENCOUNTER — Telehealth: Payer: Self-pay | Admitting: Pharmacist

## 2020-01-24 NOTE — Progress Notes (Addendum)
Chronic Care Management Pharmacy Assistant   Name: KVON MCILHENNY  MRN: [id number] DOB: 03/08/1940  Reason for Encounter: Disease State for DM.  Patient Questions:  1.  Have you seen any other providers since your last visit? No.   2.  Any changes in your medicines or health? No.    PCP : [name], MD   Their chronic conditions include: hypertension, type II diabetes, hyperlipidemia, tobacco use.  Office Visits: None since 12/19/19  Consults: None since 12/19/19  Allergies:   Allergies  Allergen Reactions   Invokana [Canagliflozin] Palpitations, Rash and Other (See Comments)    Rash, tachycardia,weight loss    Plavix [Clopidogrel Bisulfate]     Upset stomach    Medications: Outpatient Encounter Medications as of 01/24/2020  Medication Sig   ALPRAZolam (XANAX) 0.25 MG tablet TAKE 2 TABLETS BY MOUTH THREE TIMES DAILY AS NEEDED   B-D ULTRAFINE III SHORT PEN 31G X 8 MM MISC USE TO INJECT INSULIN EVERY DAY   Blood Glucose Monitoring Suppl (ONETOUCH VERIO) w/Device KIT USE TO TEST BLOOD SUGAR LEVELS THREE TIMES DAILY   cetirizine (ZYRTEC) 10 MG tablet Take 10 mg by mouth daily.   donepezil (ARICEPT) 10 MG tablet Take 1 tablet (10 mg total) by mouth at bedtime.   HYDROcodone-acetaminophen (NORCO/VICODIN) 5-325 MG tablet Take 1 tablet by mouth every 6 (six) hours as needed for moderate pain (Must last 30 days).   Lancets (ONETOUCH DELICA PLUS [id number]) MISC USE TO CHECK BLOOD SUGAR TWICE DAILY. Dx: E11.9.   LANTUS SOLOSTAR 100 UNIT/ML Solostar Pen ADMINISTER 15 UNITS UNDER THE SKIN DAILY (Patient taking differently: 50 Units. )   losartan (COZAAR) 50 MG tablet TAKE 1 TABLET BY MOUTH EVERY NIGHT AT BEDTIME   memantine (NAMENDA) 10 MG tablet TAKE 1 TABLET(10 MG) BY MOUTH TWICE DAILY   metFORMIN (GLUCOPHAGE) 1000 MG tablet TAKE 1 TABLET(1000 MG) BY MOUTH TWICE DAILY WITH A MEAL   ONETOUCH VERIO test strip USE TO TEST BLOOD SUGAR 2 TO 3 TIMES DAILY   pioglitazone (ACTOS)  45 MG tablet TAKE 1 TABLET(45 MG) BY MOUTH AT BEDTIME   rosuvastatin (CRESTOR) 40 MG tablet TAKE 1 TABLET(40 MG) BY MOUTH DAILY   Vitamin D, Ergocalciferol, (DRISDOL) 1.25 MG (50000 UNIT) CAPS capsule Take 50,000 Units by mouth every 7 (seven) days.   No facility-administered encounter medications on file as of 01/24/2020.    Current Diagnosis: Patient Active Problem List   Diagnosis Date Noted   Carotid stenosis 06/15/2016   Carotid disease, bilateral ([place]) 04/12/2015   History of colonic polyps    Iron deficiency anemia 12/21/2013   Smoker 12/21/2013   Anxiety    Diabetes mellitus ([place])    Diabetes mellitus type 2, uncomplicated ([place])    Hyperlipemia    Hypertension    Cellulitis of foot, left 08/09/2010    Goals Addressed   None    Recent Relevant Labs: Lab Results  Component Value Date/Time   HGBA1C 8.7 (H) 07/20/2019 12:41 PM   HGBA1C 9.2 (H) 10/06/2018 02:09 PM   MICROALBUR 0.6 05/11/2016 09:51 AM   MICROALBUR <0.2 03/15/2015 04:31 PM    Kidney Function Lab Results  Component Value Date/Time   CREATININE 0.81 07/20/2019 12:41 PM   CREATININE 0.83 10/06/2018 02:09 PM   GFRNONAA 85 07/20/2019 12:41 PM   GFRAA 99 07/20/2019 12:41 PM    Current antihyperglycemic regimen:  Lantus 100u/ml 50 units daily Pioglitazone 45 mg daily Metformin 1000 mg twice daily with a  meal  What recent interventions/DTPs have been made to improve glycemic control:  None.  Have there been any recent hospitalizations or ED visits since last visit with CPP? Patient wife stated he has not been to the hospital.  Patient wife  reports hypoglycemic symptoms, including Shaky and Hungry   Patient wife  reports hyperglycemic symptoms, including excessive thirst and fatigue   How often are you checking your blood sugar? Patient stated she checks his blood sugar once daily.  What are your blood sugars ranging?   205-7 day results  262-30 day results  261-90 day results   Fasting:  N/A Before meals: N/A After meals: N/A Bedtime: N/A  During the week, how often does your blood glucose drop below 70? Once a week, Patient wife gives him a glass of juice/milk or cookies then rechecks his sugar in 15 min to ensure it has risen above 70.   Are you checking your feet daily/regularly?   Patient wife stated she checks his feet daily,   Adherence Review: Is the patient currently on a STATIN medication? Rosuvastatin 40 mg   Is the patient currently on ACE/ARB medication? Losartan 50 mg    Does the patient have >5 day gap between last estimated fill dates? No, CPP Please Check.  Patient stated she is not having any problem getting his medication at this time.   Follow-Up:  Pharmacist Review   Charlann Lange, RMA Clinical Pharmacist Assistant [phone]  7 minutes spent in review, coordination, and documentation.  Reviewed by: Beverly Milch, PharmD Clinical Pharmacist [facility] [phone]

## 2020-01-26 ENCOUNTER — Telehealth: Payer: Self-pay

## 2020-01-26 NOTE — Telephone Encounter (Signed)
Pt's wife called to inform that pt does not feel well. Called left message for cb

## 2020-02-01 ENCOUNTER — Other Ambulatory Visit: Payer: Self-pay | Admitting: Family Medicine

## 2020-02-01 NOTE — Telephone Encounter (Signed)
Ok to refill??  Last office visit 07/20/2019.  Last refill 01/01/2020.

## 2020-02-08 ENCOUNTER — Telehealth: Payer: Self-pay | Admitting: Orthopaedic Surgery

## 2020-02-08 NOTE — Telephone Encounter (Signed)
Patient is requesting Hydrocodone/Acetaminophen 5-325 Mgs. Qty 90  Sig:Take 1 tablet by mouth every 6 (six) hours as needed for moderate pain (Must last 30 days).  Patient states he uses [facility] on [address] 

## 2020-02-12 MED ORDER — HYDROCODONE-ACETAMINOPHEN 5-325 MG PO TABS: 1.0000 | ORAL_TABLET | Freq: Four times a day (QID) | ORAL | 0 refills | Status: DC | PRN

## 2020-02-23 ENCOUNTER — Ambulatory Visit (INDEPENDENT_AMBULATORY_CARE_PROVIDER_SITE_OTHER): Payer: PPO | Admitting: Pharmacist

## 2020-02-23 DIAGNOSIS — Z794 Long term (current) use of insulin: Secondary | ICD-10-CM | POA: Diagnosis not present

## 2020-02-23 DIAGNOSIS — E119 Type 2 diabetes mellitus without complications: Secondary | ICD-10-CM

## 2020-02-23 DIAGNOSIS — I10 Essential (primary) hypertension: Secondary | ICD-10-CM | POA: Diagnosis not present

## 2020-02-23 NOTE — Progress Notes (Signed)
Chronic Care Management Pharmacy Note  02/23/2020 Name:  [name] [name] MRN:  [id number] DOB:  03-Nov-1940  Subjective: [name] [name] is an 80 y.o. year old male who is a primary patient of [name], Cammie Mcgee, MD.  The CCM team was consulted for assistance with disease management and care coordination needs.    Engaged with patient by telephone for follow up visit in response to provider referral for pharmacy case management and/or care coordination services.   Consent to Services:  The patient was given the following information about Chronic Care Management services today, agreed to services, and gave verbal consent: 1. CCM service includes personalized support from designated clinical staff supervised by the primary care provider, including individualized plan of care and coordination with other care providers 2. 24/7 contact phone numbers for assistance for urgent and routine care needs. 3. Service will only be billed when office clinical staff spend 20 minutes or more in a month to coordinate care. 4. Only one practitioner may furnish and bill the service in a calendar month. 5.The patient may stop CCM services at any time (effective at the end of the month) by phone call to the office staff. 6. The patient will be responsible for cost sharing (co-pay) of up to 20% of the service fee (after annual deductible is met). Patient agreed to services and consent obtained.  Patient Care Team: [name], MD as PCP - General (Family Medicine) Edythe Clarity, [facility] as Pharmacist (Pharmacist) Edythe Clarity, [facility] as Pharmacist (Pharmacist)  Recent office visits: 10/06/2018 [name]) - last PCP visit, goal A1c < 7 suggested increase in insulin to 30 units daily in response to constantly elevated BG  Recent consult visits:  12/20/2018 [name], Ortho) - patient has chronic pain, no weakness, gets Norco 5/349m  Hospital visits: None in previous 6 months  Objective:  Lab Results   Component Value Date   CREATININE 0.81 07/20/2019   BUN 15 07/20/2019   GFRNONAA 85 07/20/2019   GFRAA 99 07/20/2019   NA 140 07/20/2019   K 4.4 07/20/2019   CALCIUM 9.8 07/20/2019   CO2 31 07/20/2019    Lab Results  Component Value Date/Time   HGBA1C 8.7 (H) 07/20/2019 12:41 PM   HGBA1C 9.2 (H) 10/06/2018 02:09 PM   MICROALBUR 0.6 05/11/2016 09:51 AM   MICROALBUR <0.2 03/15/2015 04:31 PM    Last diabetic Eye exam: No results found for: HMDIABEYEEXA  Last diabetic Foot exam: No results found for: HMDIABFOOTEX   Lab Results  Component Value Date   CHOL 152 07/20/2019   HDL 54 07/20/2019   LDLCALC 78 07/20/2019   LDLDIRECT 109 (H) 04/07/2017   TRIG 116 07/20/2019   CHOLHDL 2.8 07/20/2019    Hepatic Function Latest Ref Rng & Units 07/20/2019 10/06/2018 01/24/2018  Total Protein 6.1 - 8.1 g/dL 6.8 6.1 6.2  Albumin 3.5 - 5.0 g/dL - - -  AST 10 - 35 U/L '18 19 15  ' ALT 9 - 46 U/L '16 17 12  ' Alk Phosphatase 38 - 126 U/L - - -  Total Bilirubin 0.2 - 1.2 mg/dL 0.4 0.3 0.3    Lab Results  Component Value Date/Time   TSH 2.45 11/07/2015 08:43 AM    CBC Latest Ref Rng & Units 07/20/2019 10/06/2018 01/24/2018  WBC 3.8 - 10.8 Thousand/uL 10.7 12.0(H) 10.5  Hemoglobin 13.2 - 17.1 g/dL 13.9 13.0(L) 12.9(L)  Hematocrit 38.5 - 50.0 % 41.6 38.8 38.8  Platelets 140 - 400 Thousand/uL 222 222  250    No results found for: VD25OH  Clinical ASCVD: No  The ASCVD Risk score Mikey Bussing DC Jr., et al., 2013) failed to calculate for the following reasons:   The patient has a prior MI or stroke diagnosis    Depression screen [facility] 2/9 08/17/2017 08/03/2017 07/26/2017  Decreased Interest 2 3 0  Down, Depressed, Hopeless 0 0 0  PHQ - 2 Score 2 3 0  Altered sleeping 1 3 -  Tired, decreased energy 3 3 -  Change in appetite 0 0 -  Feeling bad or failure about yourself  0 0 -  Trouble concentrating 0 3 -  Moving slowly or fidgety/restless 1 1 -  Suicidal thoughts 0 0 -  PHQ-9 Score 7 13 -  Difficult  doing work/chores Very difficult Very difficult -      Social History   Tobacco Use  Smoking Status Current Every Day Smoker  . Packs/day: 1.00  . Years: 60.00  . Pack years: 60.00  . Types: Cigarettes  Smokeless Tobacco Never Used   BP Readings from Last 3 Encounters:  12/19/19 109/63  09/19/19 (!) 141/58  07/20/19 110/60   Pulse Readings from Last 3 Encounters:  12/19/19 78  09/19/19 79  07/20/19 79   Wt Readings from Last 3 Encounters:  12/19/19 149 lb (67.6 kg)  09/19/19 151 lb (68.5 kg)  07/20/19 150 lb (68 kg)    Assessment/Interventions: Review of patient past medical history, allergies, medications, health status, including review of consultants reports, laboratory and other test data, was performed as part of comprehensive evaluation and provision of chronic care management services.   SDOH:  (Social Determinants of Health) assessments and interventions performed: No   CCM Care Plan  Allergies  Allergen Reactions  . Invokana [Canagliflozin] Palpitations, Rash and Other (See Comments)    Rash, tachycardia,weight loss   . Plavix [Clopidogrel Bisulfate]     Upset stomach    Medications Reviewed Today    Reviewed by Edythe Clarity, [facility] (Pharmacist) on 02/23/20 at 1604  Med List Status: <None>  Medication Order Taking? Sig Documenting Provider Last Dose Status Informant  ALPRAZolam (XANAX) 0.25 MG tablet [id number]  TAKE 2 TABLETS BY MOUTH THREE TIMES DAILY AS NEEDED [name], MD  Active   B-D ULTRAFINE III SHORT PEN 31G X 8 MM MISC [id number]  USE TO INJECT INSULIN EVERY DAY [name], MD  Active   Blood Glucose Monitoring Suppl [facility] VERIO) w/Device Drucie Opitz [id number] No USE TO TEST BLOOD SUGAR LEVELS THREE TIMES DAILY Orlena Sheldon, PA-C Taking Active Spouse/Significant Other  cetirizine (ZYRTEC) 10 MG tablet [id number] No Take 10 mg by mouth daily. [provider] Taking Active   donepezil (ARICEPT) 10 MG tablet [id number] No Take  1 tablet (10 mg total) by mouth at bedtime. [name], MD Taking Active   HYDROcodone-acetaminophen (NORCO/VICODIN) 5-325 MG tablet [id number]  Take 1 tablet by mouth every 6 (six) hours as needed for moderate pain (Must last 30 days). Sanjuana Kava, MD  Active   Lancets (ONETOUCH DELICA PLUS TKPTWS56C) [place] [id number] No USE TO CHECK BLOOD SUGAR TWICE DAILY. Dx: E11.9. [name], MD Taking Active   LANTUS SOLOSTAR 100 UNIT/ML Solostar Pen [id number] No ADMINISTER 15 UNITS UNDER THE SKIN DAILY  Patient taking differently: 50 Units.    [name], MD Taking Active   losartan (COZAAR) 50 MG tablet [id number] No TAKE 1 TABLET BY MOUTH EVERY NIGHT AT BEDTIME [name],  MD Taking Active   memantine (NAMENDA) 10 MG tablet [id number] No TAKE 1 TABLET(10 MG) BY MOUTH TWICE DAILY [name], MD Taking Active   metFORMIN (GLUCOPHAGE) 1000 MG tablet [id number] No TAKE 1 TABLET(1000 MG) BY MOUTH TWICE DAILY WITH A MEAL [name], MD Taking Active   [facility] VERIO test strip [id number] No USE TO TEST BLOOD SUGAR 2 TO 3 TIMES DAILY [name], MD Taking Active   pioglitazone (ACTOS) 45 MG tablet [id number] No TAKE 1 TABLET(45 MG) BY MOUTH AT BEDTIME [name], MD Taking Active   rosuvastatin (CRESTOR) 40 MG tablet [id number] No TAKE 1 TABLET(40 MG) BY MOUTH DAILY [name], MD Taking Active   Vitamin D, Ergocalciferol, (DRISDOL) 1.25 MG (50000 UNIT) CAPS capsule [id number] No Take 50,000 Units by mouth every 7 (seven) days. [provider] Taking Active           Patient Active Problem List   Diagnosis Date Noted  . Carotid stenosis 06/15/2016  . Carotid disease, bilateral ([place]) 04/12/2015  . History of colonic polyps   . Iron deficiency anemia 12/21/2013  . Smoker 12/21/2013  . Anxiety   . Diabetes mellitus ([place])   . Diabetes mellitus type 2, uncomplicated ([place])   . Hyperlipemia   . Hypertension   . Cellulitis of foot, left  08/09/2010    Immunization History  Administered Date(s) Administered  . Influenza, High Dose Seasonal PF 10/22/2016, 01/24/2018  . Influenza,inj,Quad PF,6+ Mos 10/22/2014, 11/07/2015  . Influenza-Unspecified 09/12/2013  . Pneumococcal Conjugate-13 11/07/2015  . Pneumococcal Polysaccharide-23 06/26/2014  . Tdap 03/09/2016    Conditions to be addressed/monitored:  hypertension, type II diabetes, hyperlipidemia, tobacco use.  Care Plan : General Pharmacy (Adult)  Updates made by Edythe Clarity, RPH since 02/23/2020 12:00 AM    Problem: HTN, DM   Priority: High  Onset Date: 02/23/2020    Long-Range Goal: Patient-Specific Goal   Start Date: 02/23/2020  Expected End Date: 08/22/2020  This Visit's Progress: On track  Priority: High  Note:   Current Barriers:  . No specific barriers identified at this visit   Pharmacist Clinical Goal(s):  Marland Kitchen Over the next 120 days, patient will maintain control of blood glucose and blood pressure as evidenced by A1c, home monitoring  through collaboration with PharmD and provider.    Interventions: . 1:1 collaboration with [name], MD regarding development and update of comprehensive plan of care as evidenced by provider attestation and co-signature . Inter-disciplinary care team collaboration (see longitudinal plan of care) . Comprehensive medication review performed; medication list updated in electronic medical record  Hypertension (BP goal <130/80) -controlled -Current treatment:  Losartan 34m -Medications previously tried: none noted  -Current home readings: doesn't check regularly -Current dietary habits: likes to snack, wife describes him as having sweet tooth. -Current exercise habits: love to walk outside when weather is nice -Denies hypotensive/hypertensive symptoms -Educated on BP goals and benefits of medications for prevention of heart attack, stroke and kidney damage; Daily salt intake goal < 2300 mg; Importance  of home blood pressure monitoring; -Counseled to monitor BP at home periodically, document, and provide log at future appointments -Recommended to continue current medication  Diabetes (A1c goal <7%) -controlled -Current medications:  Lantus 100u/ml 45-50 units daily - in the morning  Pioglitazone 478mdaily  Metformin 100046mwice daily with a meal -Medications previously tried: Trulicity  -Current home glucose readings . fasting glucose: 94-150, one reading low 200s when they had a  big meal the night before -Denies hypoglycemic/hyperglycemic symptoms -Current meal patterns:  . breakfast:  . lunch:  . dinner:  . snacks: low sugar pudding, low sugar fruit cup . drinks:  -Current exercise: minimal, loves to walk outside when weather is nice -Educated onA1c and blood sugar goals; Prevention and management of hypoglycemic episodes; Benefits of routine self-monitoring of blood sugar; -Counseled to check feet daily and get yearly eye exams -Recommended to continue current medication Assessed current risk for hypoglycemia   Patient Goals/Self-Care Activities . Over the next 120 days, patient will:  - take medications as prescribed check glucose daily, document, and provide at future appointments check blood pressure periodically, document, and provide at future appointments  Follow Up Plan: The care management team will reach out to the patient again over the next 120 days.         Medication Assistance: None required.  Patient affirms current coverage meets needs.  Patient's preferred pharmacy is:  [id number] - [place], [place] - [address] AT [place]. [name] [place] [address] Phone: [phone] Fax: [phone]  Uses pill box? Yes Pt endorses 100% compliance  We discussed: Benefits of medication synchronization, packaging and delivery as well as enhanced pharmacist oversight with [employer]. Patient  decided to: Continue current medication management strategy  Care Plan and Follow Up Patient Decision:  Patient agrees to Care Plan and Follow-up.  Plan: The care management team will reach out to the patient again over the next 120 days.  Beverly Milch, PharmD Clinical Pharmacist [place] [phone]

## 2020-02-23 NOTE — Patient Instructions (Addendum)
Visit Information  Goals Addressed            This Visit's Progress   . Monitor and Manage My Blood Sugar-Diabetes Type 2       Timeframe:  Long-Range Goal Priority:  High Start Date:    02/23/20                         Expected End Date:   02/23/20                    Follow Up Date 05/11/20   - check blood sugar at prescribed times - enter blood sugar readings and medication or insulin into daily log - take the blood sugar log to all doctor visits    Why is this important?    Checking your blood sugar at home helps to keep it from getting very high or very low.   Writing the results in a diary or log helps the doctor know how to care for you.   Your blood sugar log should have the time, date and the results.   Also, write down the amount of insulin or other medicine that you take.   Other information, like what you ate, exercise done and how you were feeling, will also be helpful.     Notes: Contact providers with any hypoglycemia < 70      Patient Care Plan: General Pharmacy (Adult)    Problem Identified: HTN, DM   Priority: High  Onset Date: 02/23/2020    Long-Range Goal: Patient-Specific Goal   Start Date: 02/23/2020  Expected End Date: 08/22/2020  This Visit's Progress: On track  Priority: High  Note:   Current Barriers:  . No specific barriers identified at this visit   Pharmacist Clinical Goal(s):  Marland Kitchen Over the next 120 days, patient will maintain control of blood glucose and blood pressure as evidenced by A1c, home monitoring  through collaboration with PharmD and provider.    Interventions: . 1:1 collaboration with [name], MD regarding development and update of comprehensive plan of care as evidenced by provider attestation and co-signature . Inter-disciplinary care team collaboration (see longitudinal plan of care) . Comprehensive medication review performed; medication list updated in electronic medical record  Hypertension (BP goal  <130/80) -controlled -Current treatment:  Losartan 50mg  -Medications previously tried: none noted  -Current home readings: doesn't check regularly -Current dietary habits: likes to snack, wife describes him as having sweet tooth. -Current exercise habits: love to walk outside when weather is nice -Denies hypotensive/hypertensive symptoms -Educated on BP goals and benefits of medications for prevention of heart attack, stroke and kidney damage; Daily salt intake goal < 2300 mg; Importance of home blood pressure monitoring; -Counseled to monitor BP at home periodically, document, and provide log at future appointments -Recommended to continue current medication  Diabetes (A1c goal <7%) -controlled -Current medications:  Lantus 100u/ml 45-50 units daily - in the morning  Pioglitazone 45mg  daily  Metformin 1000mg  twice daily with a meal -Medications previously tried: Trulicity  -Current home glucose readings . fasting glucose: 94-150, one reading low 200s when they had a big meal the night before -Denies hypoglycemic/hyperglycemic symptoms -Current meal patterns:  . breakfast:  . lunch:  . dinner:  . snacks: low sugar pudding, low sugar fruit cup . drinks:  -Current exercise: minimal, loves to walk outside when weather is nice -Educated onA1c and blood sugar goals; Prevention and management of hypoglycemic episodes; Benefits of  routine self-monitoring of blood sugar; -Counseled to check feet daily and get yearly eye exams -Recommended to continue current medication Assessed current risk for hypoglycemia   Patient Goals/Self-Care Activities . Over the next 120 days, patient will:  - take medications as prescribed check glucose daily, document, and provide at future appointments check blood pressure periodically, document, and provide at future appointments  Follow Up Plan: The care management team will reach out to the patient again over the next 120 days.          The patient verbalized understanding of instructions, educational materials, and care plan provided today and agreed to receive a mailed copy of patient instructions, educational materials, and care plan.   Telephone follow up appointment with pharmacy team member scheduled for: 4 months  Edythe Clarity, [facility]  Diabetes Mellitus and Nutrition, Adult When you have diabetes, or diabetes mellitus, it is very important to have healthy eating habits because your blood sugar (glucose) levels are greatly affected by what you eat and drink. Eating healthy foods in the right amounts, at about the same times every day, can help you:  Control your blood glucose.  Lower your risk of heart disease.  Improve your blood pressure.  Reach or maintain a healthy weight. What can affect my meal plan? Every person with diabetes is different, and each person has different needs for a meal plan. Your health care provider may recommend that you work with a dietitian to make a meal plan that is best for you. Your meal plan may vary depending on factors such as:  The calories you need.  The medicines you take.  Your weight.  Your blood glucose, blood pressure, and cholesterol levels.  Your activity level.  Other health conditions you have, such as heart or kidney disease. How do carbohydrates affect me? Carbohydrates, also called carbs, affect your blood glucose level more than any other type of food. Eating carbs naturally raises the amount of glucose in your blood. Carb counting is a method for keeping track of how many carbs you eat. Counting carbs is important to keep your blood glucose at a healthy level, especially if you use insulin or take certain oral diabetes medicines. It is important to know how many carbs you can safely have in each meal. This is different for every person. Your dietitian can help you calculate how many carbs you should have at each meal and for each snack. How does  alcohol affect me? Alcohol can cause a sudden decrease in blood glucose (hypoglycemia), especially if you use insulin or take certain oral diabetes medicines. Hypoglycemia can be a life-threatening condition. Symptoms of hypoglycemia, such as sleepiness, dizziness, and confusion, are similar to symptoms of having too much alcohol.  Do not drink alcohol if: ? Your health care provider tells you not to drink. ? You are pregnant, may be pregnant, or are planning to become pregnant.  If you drink alcohol: ? Do not drink on an empty stomach. ? Limit how much you use to:  0-1 drink a day for women.  0-2 drinks a day for men. ? Be aware of how much alcohol is in your drink. In the [place] one drink equals one 12 oz bottle of beer (355 mL), one 5 oz glass of wine (148 mL), or one 1 oz glass of hard liquor (44 mL). ? Keep yourself hydrated with water, diet soda, or unsweetened iced tea.  Keep in mind that regular soda, juice, and other mixers may  contain a lot of sugar and must be counted as carbs. What are tips for following this plan? Reading food labels  Start by checking the serving size on the "Nutrition Facts" label of packaged foods and drinks. The amount of calories, carbs, fats, and other nutrients listed on the label is based on one serving of the item. Many items contain more than one serving per package.  Check the total grams (g) of carbs in one serving. You can calculate the number of servings of carbs in one serving by dividing the total carbs by 15. For example, if a food has 30 g of total carbs per serving, it would be equal to 2 servings of carbs.  Check the number of grams (g) of saturated fats and trans fats in one serving. Choose foods that have a low amount or none of these fats.  Check the number of milligrams (mg) of salt (sodium) in one serving. Most people should limit total sodium intake to less than 2,300 mg per day.  Always check the nutrition information of foods  labeled as "low-fat" or "nonfat." These foods may be higher in added sugar or refined carbs and should be avoided.  Talk to your dietitian to identify your daily goals for nutrients listed on the label. Shopping  Avoid buying canned, pre-made, or processed foods. These foods tend to be high in fat, sodium, and added sugar.  Shop around the outside edge of the grocery store. This is where you will most often find fresh fruits and vegetables, bulk grains, fresh meats, and fresh dairy. Cooking  Use low-heat cooking methods, such as baking, instead of high-heat cooking methods like deep frying.  Cook using healthy oils, such as olive, canola, or sunflower oil.  Avoid cooking with butter, cream, or high-fat meats. Meal planning  Eat meals and snacks regularly, preferably at the same times every day. Avoid going long periods of time without eating.  Eat foods that are high in fiber, such as fresh fruits, vegetables, beans, and whole grains. Talk with your dietitian about how many servings of carbs you can eat at each meal.  Eat 4-6 oz (112-168 g) of lean protein each day, such as lean meat, chicken, fish, eggs, or tofu. One ounce (oz) of lean protein is equal to: ? 1 oz (28 g) of meat, chicken, or fish. ? 1 egg. ?  cup (62 g) of tofu.  Eat some foods each day that contain healthy fats, such as avocado, nuts, seeds, and fish.   What foods should I eat? Fruits Berries. Apples. Oranges. Peaches. Apricots. Plums. Grapes. Mango. Papaya. Pomegranate. Kiwi. Cherries. Vegetables Lettuce. Spinach. Leafy greens, including kale, chard, collard greens, and mustard greens. Beets. Cauliflower. Cabbage. Broccoli. Carrots. Green beans. Tomatoes. Peppers. Onions. Cucumbers. Brussels sprouts. Grains Whole grains, such as whole-wheat or whole-grain bread, crackers, tortillas, cereal, and pasta. Unsweetened oatmeal. Quinoa. Brown or wild rice. Meats and other proteins Seafood. Poultry without skin. Lean  cuts of poultry and beef. Tofu. Nuts. Seeds. Dairy Low-fat or fat-free dairy products such as milk, yogurt, and cheese. The items listed above may not be a complete list of foods and beverages you can eat. Contact a dietitian for more information. What foods should I avoid? Fruits Fruits canned with syrup. Vegetables Canned vegetables. Frozen vegetables with butter or cream sauce. Grains Refined white flour and flour products such as bread, pasta, snack foods, and cereals. Avoid all processed foods. Meats and other proteins Fatty cuts of meat. Poultry with skin.  Breaded or fried meats. Processed meat. Avoid saturated fats. Dairy Full-fat yogurt, cheese, or milk. Beverages Sweetened drinks, such as soda or iced tea. The items listed above may not be a complete list of foods and beverages you should avoid. Contact a dietitian for more information. Questions to ask a health care provider  Do I need to meet with a diabetes educator?  Do I need to meet with a dietitian?  What number can I call if I have questions?  When are the best times to check my blood glucose? Where to find more information:  American Diabetes Association: diabetes.org  Academy of Nutrition and Dietetics: www.eatright.[employer] of Diabetes and Digestive and Kidney Diseases: [employer]dk  Association of Diabetes Care and Education Specialists: www.diabeteseducator.org Summary  It is important to have healthy eating habits because your blood sugar (glucose) levels are greatly affected by what you eat and drink.  A healthy meal plan will help you control your blood glucose and maintain a healthy lifestyle.  Your health care provider may recommend that you work with a dietitian to make a meal plan that is best for you.  Keep in mind that carbohydrates (carbs) and alcohol have immediate effects on your blood glucose levels. It is important to count carbs and to use alcohol carefully. This  information is not intended to replace advice given to you by your health care provider. Make sure you discuss any questions you have with your health care provider. Document Revised: 12/06/2018 Document Reviewed: 12/06/2018 Elsevier Patient Education  2021 [employer].

## 2020-03-03 ENCOUNTER — Other Ambulatory Visit: Payer: Self-pay | Admitting: Family Medicine

## 2020-03-12 ENCOUNTER — Telehealth: Payer: Self-pay

## 2020-03-12 NOTE — Telephone Encounter (Signed)
Fors faxed to aging, disability and  [employer], attn made to [employer] at [phone]

## 2020-03-13 ENCOUNTER — Telehealth: Payer: Self-pay | Admitting: Orthopaedic Surgery

## 2020-03-13 MED ORDER — HYDROCODONE-ACETAMINOPHEN 5-325 MG PO TABS: 1.0000 | ORAL_TABLET | Freq: Four times a day (QID) | ORAL | 0 refills | Status: DC | PRN

## 2020-03-13 NOTE — Telephone Encounter (Signed)
Patient is requesting Hydrocodone/Acetaminophen 5-325 Mgs. Qty 90  CRF:VOHK 1 tablet by mouth every 6 (six) hours as needed for moderate pain (Must last 30 days).  Patient states he uses [facility] on [address]

## 2020-03-19 ENCOUNTER — Encounter: Payer: Self-pay | Admitting: Orthopaedic Surgery

## 2020-03-19 ENCOUNTER — Other Ambulatory Visit: Payer: Self-pay

## 2020-03-19 ENCOUNTER — Ambulatory Visit (INDEPENDENT_AMBULATORY_CARE_PROVIDER_SITE_OTHER): Payer: PPO | Admitting: Orthopaedic Surgery

## 2020-03-19 VITALS — BP 108/62 | HR 83 | Ht 72.0 in | Wt 150.0 lb

## 2020-03-19 DIAGNOSIS — F17210 Nicotine dependence, cigarettes, uncomplicated: Secondary | ICD-10-CM

## 2020-03-19 DIAGNOSIS — G8929 Other chronic pain: Secondary | ICD-10-CM

## 2020-03-19 DIAGNOSIS — M5441 Lumbago with sciatica, right side: Secondary | ICD-10-CM | POA: Diagnosis not present
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
## 2020-03-22 ENCOUNTER — Other Ambulatory Visit: Payer: Self-pay | Admitting: Family Medicine

## 2020-03-27 ENCOUNTER — Other Ambulatory Visit: Payer: Self-pay | Admitting: Family Medicine

## 2020-04-01 ENCOUNTER — Other Ambulatory Visit: Payer: Self-pay | Admitting: Family Medicine

## 2020-04-02 NOTE — Telephone Encounter (Signed)
Needs ov

## 2020-04-08 DIAGNOSIS — R531 Weakness: Secondary | ICD-10-CM | POA: Diagnosis not present

## 2020-04-08 DIAGNOSIS — L0390 Cellulitis, unspecified: Secondary | ICD-10-CM | POA: Diagnosis not present

## 2020-04-08 DIAGNOSIS — I639 Cerebral infarction, unspecified: Secondary | ICD-10-CM | POA: Diagnosis not present

## 2020-04-08 DIAGNOSIS — M130 Polyarthritis, unspecified: Secondary | ICD-10-CM | POA: Diagnosis not present

## 2020-04-11 ENCOUNTER — Telehealth: Payer: Self-pay | Admitting: Orthopaedic Surgery

## 2020-04-11 MED ORDER — HYDROCODONE-ACETAMINOPHEN 5-325 MG PO TABS: 1.0000 | ORAL_TABLET | Freq: Four times a day (QID) | ORAL | 0 refills | Status: DC | PRN

## 2020-04-11 NOTE — Telephone Encounter (Signed)
Patient wife called to get refill for her husband pain medicine    HYDROcodone-acetaminophen (NORCO/VICODIN) 5-325 MG tablet  Pharmacy:  [facility]

## 2020-04-15 ENCOUNTER — Telehealth: Payer: Self-pay | Admitting: Pharmacist

## 2020-04-15 NOTE — Progress Notes (Addendum)
Chronic Care Management Pharmacy Assistant   Name: [name] [name]  MRN: [id number] DOB: 14-May-1940  Reason for Encounter: Disease State For DM.   Conditions to be addressed/monitored: hypertension, type II diabetes, hyperlipidemia,   Recent office visits:  None since 02/23/20  Recent consult visits:  03/19/20 Orthopedic Surgery Sanjuana Kava, MD. For Right sided lower back pain. Per note: patient has been educated about the nature of the problem(s) and counseled on treatment options. No medication changes.   Hospital visits:  None in previous 6 months  Medications: Outpatient Encounter Medications as of 04/15/2020  Medication Sig   ALPRAZolam (XANAX) 0.25 MG tablet TAKE 2 TABLETS BY MOUTH THREE TIMES DAILY AS NEEDED   B-D ULTRAFINE III SHORT PEN 31G X 8 MM MISC USE TO INJECT INSULIN EVERY DAY   Blood Glucose Monitoring Suppl (ONETOUCH VERIO) w/Device KIT USE TO TEST BLOOD SUGAR LEVELS THREE TIMES DAILY   cetirizine (ZYRTEC) 10 MG tablet Take 10 mg by mouth daily.   donepezil (ARICEPT) 10 MG tablet Take 1 tablet (10 mg total) by mouth at bedtime.   HYDROcodone-acetaminophen (NORCO/VICODIN) 5-325 MG tablet Take 1 tablet by mouth every 6 (six) hours as needed for moderate pain (Must last 30 days).   Lancets (ONETOUCH DELICA PLUS [id number]) MISC USE TO CHECK BLOOD SUGAR TWICE DAILY. Dx: E11.9.   LANTUS SOLOSTAR 100 UNIT/ML Solostar Pen ADMINISTER 15 UNITS UNDER THE SKIN DAILY (Patient taking differently: 50 Units.)   losartan (COZAAR) 50 MG tablet TAKE 1 TABLET BY MOUTH EVERY NIGHT AT BEDTIME   memantine (NAMENDA) 10 MG tablet TAKE 1 TABLET(10 MG) BY MOUTH TWICE DAILY   metFORMIN (GLUCOPHAGE) 1000 MG tablet TAKE 1 TABLET(1000 MG) BY MOUTH TWICE DAILY WITH A MEAL   ONETOUCH VERIO test strip USE TO TEST BLOOD SUGAR 2 TO 3 TIMES DAILY   pioglitazone (ACTOS) 45 MG tablet TAKE 1 TABLET(45 MG) BY MOUTH AT BEDTIME   rosuvastatin (CRESTOR) 40 MG tablet TAKE 1 TABLET(40 MG) BY MOUTH DAILY    Vitamin D, Ergocalciferol, (DRISDOL) 1.25 MG (50000 UNIT) CAPS capsule Take 50,000 Units by mouth every 7 (seven) days.   No facility-administered encounter medications on file as of 04/15/2020.    Recent Relevant Labs: Lab Results  Component Value Date/Time   HGBA1C 8.7 (H) 07/20/2019 12:41 PM   HGBA1C 9.2 (H) 10/06/2018 02:09 PM   MICROALBUR 0.6 05/11/2016 09:51 AM   MICROALBUR <0.2 03/15/2015 04:31 PM    Kidney Function Lab Results  Component Value Date/Time   CREATININE 0.81 07/20/2019 12:41 PM   CREATININE 0.83 10/06/2018 02:09 PM   GFRNONAA 85 07/20/2019 12:41 PM   GFRAA 99 07/20/2019 12:41 PM    Current antihyperglycemic regimen:  Lantus 100u/ml 50 units daily Pioglitazone 45 mg daily (evening) Metformin 1000 mg twice daily with a meal  What recent interventions/DTPs have been made to improve glycemic control:  None  Have there been any recent hospitalizations or ED visits since last visit with CPP? Patients wife stated no.  Patients wife  reports hypoglycemic symptoms, including Sweaty and Shaky   Patients wife  reports hyperglycemic symptoms, including fatigue and weakness   How often are you checking your blood sugar? Patients wife stated twice daily   What are your blood sugars ranging? Patients wife stated his 7 day blood sugar was 143.  During the week, how often does your blood glucose drop below 70? Patients wife stated his blood sugar dropped to 42 and 48 within last week.  Advised  patients wife on better choices on night time snacks.   Are you checking your feet daily/regularly?  Patients wife stated she checks his feet/regularly.   Adherence Review: Is the patient currently on a STATIN medication? Yes, Rosuvastatin 40 mg   Is the patient currently on ACE/ARB medication? Yes, Losartan 50 mg   Does the patient have >5 day gap between last estimated fill dates? Per misc rpts, yes.   Star Rating Drugs: Metformin 1000 mg 90 DS 01/27/20 , Pioglitazone 45  mg 90 DS 03/22/20 , Rosuvastatin 40 mg 90 DS 03/22/20,  Losartan 50 mg 90 DS 09/27/19.  Patients wife stated she does not have any concerns or questions about his medication at this time.   Follow-Up:Pharmacist Review    Charlann Lange, RMA Clinical Pharmacist Assistant [phone]  10 minutes spent in review, coordination, and documentation.  Reviewed by: Beverly Milch, PharmD Clinical Pharmacist [facility] [phone]

## 2020-05-02 ENCOUNTER — Telehealth: Payer: Self-pay | Admitting: *Deleted

## 2020-05-02 ENCOUNTER — Other Ambulatory Visit: Payer: Self-pay | Admitting: Family Medicine

## 2020-05-02 NOTE — Telephone Encounter (Signed)
Ok to refill??  Last office visit 08/02/2019.  Last refill 04/02/2020.  Of note, letter sent to patient to schedule OV.

## 2020-05-02 NOTE — Telephone Encounter (Signed)
-----   Message from Edythe Clarity, [facility] sent at 05/02/2020  2:13 PM EDT ----- Hey!  Patient's wife called and asked for his Alprazolam to be called in for a 30 days supply due to insurance coverage issues.  Apparently they will not cover it unless it is for a full 30 days supply!  Thanks!  Beverly Milch, PharmD Clinical Pharmacist [place] [phone]

## 2020-05-02 NOTE — Telephone Encounter (Signed)
Please note that medication is PRN. This is a full 30 day supply.   Call placed to pharmacy. Was advised that prescription is covered. Also advised that no further refills will be given as patient has not been seen since 07/20/2019.

## 2020-05-14 ENCOUNTER — Telehealth: Payer: Self-pay | Admitting: Orthopaedic Surgery

## 2020-05-14 NOTE — Telephone Encounter (Signed)
  HYDROcodone-acetaminophen (NORCO/VICODIN) 5-325 MG tablet  Qty 90  Sig: Take 1 tablet by mouth every 6 (six) hours as needed for moderate pain (Must last 30 days).  Pharmacy:  [facility]

## 2020-05-15 MED ORDER — HYDROCODONE-ACETAMINOPHEN 5-325 MG PO TABS
1.0000 | ORAL_TABLET | Freq: Four times a day (QID) | ORAL | 0 refills | Status: DC | PRN
Start: 2020-05-15 — End: 2020-06-13

## 2020-05-17 ENCOUNTER — Other Ambulatory Visit: Payer: Self-pay

## 2020-05-17 ENCOUNTER — Ambulatory Visit (INDEPENDENT_AMBULATORY_CARE_PROVIDER_SITE_OTHER): Payer: PPO | Admitting: Family Medicine

## 2020-05-17 ENCOUNTER — Encounter: Payer: Self-pay | Admitting: Family Medicine

## 2020-05-17 VITALS — BP 114/68 | HR 64 | Temp 98.1°F | Resp 18 | Ht 72.0 in | Wt 159.0 lb

## 2020-05-17 DIAGNOSIS — I10 Essential (primary) hypertension: Secondary | ICD-10-CM

## 2020-05-17 DIAGNOSIS — E119 Type 2 diabetes mellitus without complications: Secondary | ICD-10-CM | POA: Diagnosis not present

## 2020-05-17 DIAGNOSIS — E559 Vitamin D deficiency, unspecified: Secondary | ICD-10-CM | POA: Diagnosis not present

## 2020-05-17 DIAGNOSIS — E785 Hyperlipidemia, unspecified: Secondary | ICD-10-CM | POA: Diagnosis not present

## 2020-05-17 DIAGNOSIS — I6930 Unspecified sequelae of cerebral infarction: Secondary | ICD-10-CM | POA: Diagnosis not present

## 2020-05-17 DIAGNOSIS — Z794 Long term (current) use of insulin: Secondary | ICD-10-CM | POA: Diagnosis not present

## 2020-05-17 DIAGNOSIS — I639 Cerebral infarction, unspecified: Secondary | ICD-10-CM | POA: Diagnosis not present
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
## 2020-05-18 LAB — COMPLETE METABOLIC PANEL WITH GFR
AG Ratio: 1.6 (calc) (ref 1.0–2.5)
ALT: 17 U/L (ref 9–46)
AST: 19 U/L (ref 10–35)
Albumin: 4 g/dL (ref 3.6–5.1)
Alkaline phosphatase (APISO): 51 U/L (ref 35–144)
BUN: 13 mg/dL (ref 7–25)
CO2: 28 mmol/L (ref 20–32)
Calcium: 9.6 mg/dL (ref 8.6–10.3)
Chloride: 103 mmol/L (ref 98–110)
Creat: 0.87 mg/dL (ref 0.70–1.18)
GFR, Est African American: 95 mL/min/{1.73_m2} (ref >=60)
GFR, Est Non African American: 82 mL/min/{1.73_m2} (ref >=60)
Globulin: 2.5 g/dL (calc) (ref 1.9–3.7)
Glucose, Bld: 117 mg/dL — ABNORMAL HIGH (ref 65–99)
Potassium: 4.3 mmol/L (ref 3.5–5.3)
Sodium: 141 mmol/L (ref 135–146)
Total Bilirubin: 0.4 mg/dL (ref 0.2–1.2)
Total Protein: 6.5 g/dL (ref 6.1–8.1)

## 2020-05-18 LAB — LIPID PANEL
Cholesterol: 136 mg/dL (ref <200)
HDL: 53 mg/dL (ref >=40)
LDL Cholesterol (Calc): 65 mg/dL (calc)
Non-HDL Cholesterol (Calc): 83 mg/dL (calc) (ref <130)
Total CHOL/HDL Ratio: 2.6 (calc) (ref <5.0)
Triglycerides: 99 mg/dL (ref <150)

## 2020-05-18 LAB — CBC WITH DIFFERENTIAL/PLATELET
Absolute Monocytes: 792 cells/uL (ref 200–950)
Basophils Absolute: 104 cells/uL (ref 0–200)
Basophils Relative: 1.2 %
Eosinophils Absolute: 191 cells/uL (ref 15–500)
Eosinophils Relative: 2.2 %
HCT: 40.4 % (ref 38.5–50.0)
Hemoglobin: 12.9 g/dL — ABNORMAL LOW (ref 13.2–17.1)
Lymphs Abs: 2706 cells/uL (ref 850–3900)
MCH: 30.6 pg (ref 27.0–33.0)
MCHC: 31.9 g/dL — ABNORMAL LOW (ref 32.0–36.0)
MCV: 95.7 fL (ref 80.0–100.0)
MPV: 10.6 fL (ref 7.5–12.5)
Monocytes Relative: 9.1 %
Neutro Abs: 4907 cells/uL (ref 1500–7800)
Neutrophils Relative %: 56.4 %
Platelets: 244 10*3/uL (ref 140–400)
RBC: 4.22 10*6/uL (ref 4.20–5.80)
RDW: 13.3 % (ref 11.0–15.0)
Total Lymphocyte: 31.1 %
WBC: 8.7 10*3/uL (ref 3.8–10.8)

## 2020-05-18 LAB — HEMOGLOBIN A1C
Hgb A1c MFr Bld: 9.2 % of total Hgb — ABNORMAL HIGH (ref <5.7)
Mean Plasma Glucose: 217 mg/dL
eAG (mmol/L): 12 mmol/L

## 2020-05-18 LAB — VITAMIN D 25 HYDROXY (VIT D DEFICIENCY, FRACTURES): Vit D, 25-Hydroxy: 58 ng/mL (ref 30–100)

## 2020-05-22 ENCOUNTER — Other Ambulatory Visit: Payer: Self-pay | Admitting: *Deleted

## 2020-05-22 DIAGNOSIS — E119 Type 2 diabetes mellitus without complications: Secondary | ICD-10-CM

## 2020-05-31 ENCOUNTER — Other Ambulatory Visit: Payer: Self-pay | Admitting: Family Medicine

## 2020-06-04 ENCOUNTER — Other Ambulatory Visit: Payer: Self-pay | Admitting: Family Medicine

## 2020-06-09 ENCOUNTER — Other Ambulatory Visit: Payer: Self-pay | Admitting: Family Medicine

## 2020-06-13 ENCOUNTER — Telehealth: Payer: Self-pay | Admitting: Orthopaedic Surgery

## 2020-06-13 MED ORDER — HYDROCODONE-ACETAMINOPHEN 5-325 MG PO TABS: 1.0000 | ORAL_TABLET | Freq: Four times a day (QID) | ORAL | 0 refills | Status: DC | PRN

## 2020-06-13 NOTE — Telephone Encounter (Signed)
Patient wife called requesting a refill for her husband pain medicine   HYDROcodone-acetaminophen (NORCO/VICODIN) 5-325 MG tablet  Pharmacy:  [facility] on [address]

## 2020-06-18 ENCOUNTER — Ambulatory Visit (INDEPENDENT_AMBULATORY_CARE_PROVIDER_SITE_OTHER): Payer: PPO | Admitting: Orthopaedic Surgery

## 2020-06-18 ENCOUNTER — Encounter: Payer: Self-pay | Admitting: Orthopaedic Surgery

## 2020-06-18 ENCOUNTER — Other Ambulatory Visit: Payer: Self-pay

## 2020-06-18 VITALS — BP 177/73 | HR 93 | Ht 72.0 in | Wt 159.0 lb

## 2020-06-18 DIAGNOSIS — M5441 Lumbago with sciatica, right side: Secondary | ICD-10-CM

## 2020-06-18 DIAGNOSIS — G8929 Other chronic pain: Secondary | ICD-10-CM | POA: Diagnosis not present

## 2020-06-18 DIAGNOSIS — F17210 Nicotine dependence, cigarettes, uncomplicated: Secondary | ICD-10-CM

## 2020-06-18 NOTE — Progress Notes (Signed)
My back is still hurting.  He has chronic lower back pain and has a HNP.  He declines surgery.  He does not walk or do his exercises.  He takes his medicine.  He has had more pain recently.  He has no weakness but has the paresthesias.    Spine/Pelvis examination:  Inspection:  Overall, sacoiliac joint benign and hips nontender; without crepitus or defects.   Thoracic spine inspection: Alignment normal without kyphosis present   Lumbar spine inspection:  Alignment  with normal lumbar lordosis, without scoliosis apparent.   Thoracic spine palpation:  without tenderness of spinal processes   Lumbar spine palpation: without tenderness of lumbar area; without tightness of lumbar muscles    Range of Motion:   Lumbar flexion, forward flexion is normal without pain or tenderness    Lumbar extension is full without pain or tenderness   Left lateral bend is normal without pain or tenderness   Right lateral bend is normal without pain or tenderness   Straight leg raising is normal  Strength & tone: normal   Stability overall normal stability Encounter Diagnoses  Name Primary?  . Chronic right-sided low back pain with right-sided sciatica Yes  . Cigarette nicotine dependence without complication    Return in three months.  Call if he changes his mind for lower back surgery.  Call if any problem.  Precautions discussed.   Electronically Signed Sanjuana Kava, MD 6/7/202210:38 AM

## 2020-06-18 NOTE — Patient Instructions (Signed)
Steps to Quit Smoking Smoking tobacco is the leading cause of preventable death. It can affect almost every organ in the body. Smoking puts you and people around you at risk for many serious, long-lasting (chronic) diseases. Quitting smoking can be hard, but it is one of the best things that you can do for your health. It is never too late to quit. How do I get ready to quit? When you decide to quit smoking, make a plan to help you succeed. Before you quit:  Pick a date to quit. Set a date within the next 2 weeks to give you time to prepare.  Write down the reasons why you are quitting. Keep this list in places where you will see it often.  Tell your family, friends, and co-workers that you are quitting. Their support is important.  Talk with your doctor about the choices that may help you quit.  Find out if your health insurance will pay for these treatments.  Know the people, places, things, and activities that make you want to smoke (triggers). Avoid them. What first steps can I take to quit smoking?  Throw away all cigarettes at home, at work, and in your car.  Throw away the things that you use when you smoke, such as ashtrays and lighters.  Clean your car. Make sure to empty the ashtray.  Clean your home, including curtains and carpets. What can I do to help me quit smoking? Talk with your doctor about taking medicines and seeing a counselor at the same time. You are more likely to succeed when you do both.  If you are pregnant or breastfeeding, talk with your doctor about counseling or other ways to quit smoking. Do not take medicine to help you quit smoking unless your doctor tells you to do so. To quit smoking: Quit right away  Quit smoking totally, instead of slowly cutting back on how much you smoke over a period of time.  Go to counseling. You are more likely to quit if you go to counseling sessions regularly. Take medicine You may take medicines to help you quit. Some  medicines need a prescription, and some you can buy over-the-counter. Some medicines may contain a drug called nicotine to replace the nicotine in cigarettes. Medicines may:  Help you to stop having the desire to smoke (cravings).  Help to stop the problems that come when you stop smoking (withdrawal symptoms). Your doctor may ask you to use:  Nicotine patches, gum, or lozenges.  Nicotine inhalers or sprays.  Non-nicotine medicine that is taken by mouth. Find resources Find resources and other ways to help you quit smoking and remain smoke-free after you quit. These resources are most helpful when you use them often. They include:  Online chats with a counselor.  Phone quitlines.  Printed self-help materials.  Support groups or group counseling.  Text messaging programs.  Mobile phone apps. Use apps on your mobile phone or tablet that can help you stick to your quit plan. There are many free apps for mobile phones and tablets as well as websites. Examples include Quit Guide from the CDC and smokefree.gov   What things can I do to make it easier to quit?  Talk to your family and friends. Ask them to support and encourage you.  Call a phone quitline (1-800-QUIT-NOW), reach out to support groups, or work with a counselor.  Ask people who smoke to not smoke around you.  Avoid places that make you want to smoke,   such as: ? Bars. ? Parties. ? Smoke-break areas at work.  Spend time with people who do not smoke.  Lower the stress in your life. Stress can make you want to smoke. Try these things to help your stress: ? Getting regular exercise. ? Doing deep-breathing exercises. ? Doing yoga. ? Meditating. ? Doing a body scan. To do this, close your eyes, focus on one area of your body at a time from head to toe. Notice which parts of your body are tense. Try to relax the muscles in those areas.   How will I feel when I quit smoking? Day 1 to 3 weeks Within the first 24 hours,  you may start to have some problems that come from quitting tobacco. These problems are very bad 2-3 days after you quit, but they do not often last for more than 2-3 weeks. You may get these symptoms:  Mood swings.  Feeling restless, nervous, angry, or annoyed.  Trouble concentrating.  Dizziness.  Strong desire for high-sugar foods and nicotine.  Weight gain.  Trouble pooping (constipation).  Feeling like you may vomit (nausea).  Coughing or a sore throat.  Changes in how the medicines that you take for other issues work in your body.  Depression.  Trouble sleeping (insomnia). Week 3 and afterward After the first 2-3 weeks of quitting, you may start to notice more positive results, such as:  Better sense of smell and taste.  Less coughing and sore throat.  Slower heart rate.  Lower blood pressure.  Clearer skin.  Better breathing.  Fewer sick days. Quitting smoking can be hard. Do not give up if you fail the first time. Some people need to try a few times before they succeed. Do your best to stick to your quit plan, and talk with your doctor if you have any questions or concerns. Summary  Smoking tobacco is the leading cause of preventable death. Quitting smoking can be hard, but it is one of the best things that you can do for your health.  When you decide to quit smoking, make a plan to help you succeed.  Quit smoking right away, not slowly over a period of time.  When you start quitting, seek help from your doctor, family, or friends. This information is not intended to replace advice given to you by your health care provider. Make sure you discuss any questions you have with your health care provider. Document Revised: 09/23/2018 Document Reviewed: 03/19/2018 Elsevier Patient Education  2021 Elsevier Inc.  

## 2020-06-24 NOTE — Progress Notes (Signed)
Chronic Care Management Pharmacy Note  06/26/2020 Name:  [name] [name] MRN:  [id number] DOB:  28-Mar-1940  Subjective: [name] [name] is an 80 y.o. year old male who is a primary patient of [name], Cammie Mcgee, MD.  The CCM team was consulted for assistance with disease management and care coordination needs.    Engaged with patient by telephone for follow up visit in response to provider referral for pharmacy case management and/or care coordination services.   Consent to Services:  The patient was given the following information about Chronic Care Management services today, agreed to services, and gave verbal consent: 1. CCM service includes personalized support from designated clinical staff supervised by the primary care provider, including individualized plan of care and coordination with other care providers 2. 24/7 contact phone numbers for assistance for urgent and routine care needs. 3. Service will only be billed when office clinical staff spend 20 minutes or more in a month to coordinate care. 4. Only one practitioner may furnish and bill the service in a calendar month. 5.The patient may stop CCM services at any time (effective at the end of the month) by phone call to the office staff. 6. The patient will be responsible for cost sharing (co-pay) of up to 20% of the service fee (after annual deductible is met). Patient agreed to services and consent obtained.  Patient Care Team: [name], MD as PCP - General (Family Medicine) Edythe Clarity, [facility] as Pharmacist (Pharmacist) Edythe Clarity, [facility] as Pharmacist (Pharmacist)  Recent office visits: 06/18/20 [name]) -  05/17/20 [name]) - difficulty managing blood sugars.  Recommended he resume Trulicity, referral to endocrinology placed.  Recent consult visits: 06/18/20 [name]) - back pain, no changes to medication.  Hospital visits: None in previous 6 months   Objective:  Lab Results  Component Value Date    CREATININE 0.87 05/17/2020   BUN 13 05/17/2020   GFRNONAA 82 05/17/2020   GFRAA 95 05/17/2020   NA 141 05/17/2020   K 4.3 05/17/2020   CALCIUM 9.6 05/17/2020   CO2 28 05/17/2020   GLUCOSE 117 (H) 05/17/2020    Lab Results  Component Value Date/Time   HGBA1C 9.2 (H) 05/17/2020 11:41 AM   HGBA1C 8.7 (H) 07/20/2019 12:41 PM   MICROALBUR 0.6 05/11/2016 09:51 AM   MICROALBUR <0.2 03/15/2015 04:31 PM    Last diabetic Eye exam:  Lab Results  Component Value Date/Time   HMDIABEYEEXA No Retinopathy 11/02/2019 04:35 PM    Last diabetic Foot exam: No results found for: HMDIABFOOTEX   Lab Results  Component Value Date   CHOL 136 05/17/2020   HDL 53 05/17/2020   LDLCALC 65 05/17/2020   LDLDIRECT 109 (H) 04/07/2017   TRIG 99 05/17/2020   CHOLHDL 2.6 05/17/2020    Hepatic Function Latest Ref Rng & Units 05/17/2020 07/20/2019 10/06/2018  Total Protein 6.1 - 8.1 g/dL 6.5 6.8 6.1  Albumin 3.5 - 5.0 g/dL - - -  AST 10 - 35 U/L _0 ALT 9 - 46 U/L _1 Alk Phosphatase 38 - 126 U/L - - -  Total Bilirubin 0.2 - 1.2 mg/dL 0.4 0.4 0.3    Lab Results  Component Value Date/Time   TSH 2.45 11/07/2015 08:43 AM    CBC Latest Ref Rng & Units 05/17/2020 07/20/2019 10/06/2018  WBC 3.8 - 10.8 Thousand/uL 8.7 10.7 12.0(H)  Hemoglobin 13.2 - 17.1 g/dL 12.9(L) 13.9 13.0(L)  Hematocrit 38.5 - 50.0 % 40.4  41.6 38.8  Platelets 140 - 400 Thousand/uL 244 222 222    Lab Results  Component Value Date/Time   VD25OH 58 05/17/2020 11:41 AM    Clinical ASCVD: No  The ASCVD Risk score Mikey Bussing DC Jr., et al., 2013) failed to calculate for the following reasons:   The patient has a prior MI or stroke diagnosis    Depression screen [facility] 2/9 05/17/2020 08/17/2017 08/03/2017  Decreased Interest _0 Down, Depressed, Hopeless 0 0 0  PHQ - 2 Score _1 Altered sleeping _2 Tired, decreased energy _3 Change in appetite 0 0 0  Feeling bad or failure about yourself  0 0 0  Trouble concentrating 2 0  3  Moving slowly or fidgety/restless 0 1 1  Suicidal thoughts 0 0 0  PHQ-9 Score _4 Difficult doing work/chores Somewhat difficult Very difficult Very difficult  Some recent data might be hidden     Social History   Tobacco Use  Smoking Status Every Day   Packs/day: 1.00   Years: 60.00   Pack years: 60.00   Types: Cigarettes  Smokeless Tobacco Never   BP Readings from Last 3 Encounters:  06/18/20 (!) 177/73  05/17/20 114/68  03/19/20 108/62   Pulse Readings from Last 3 Encounters:  06/18/20 93  05/17/20 64  03/19/20 83   Wt Readings from Last 3 Encounters:  06/18/20 159 lb (72.1 kg)  05/17/20 159 lb (72.1 kg)  03/19/20 150 lb (68 kg)   BMI Readings from Last 3 Encounters:  06/18/20 21.56 kg/m  05/17/20 21.56 kg/m  03/19/20 20.34 kg/m    Assessment/Interventions: Review of patient past medical history, allergies, medications, health status, including review of consultants reports, laboratory and other test data, was performed as part of comprehensive evaluation and provision of chronic care management services.   SDOH:  (Social Determinants of Health) assessments and interventions performed: Yes  SDOH Screenings   Alcohol Screen: Low Risk    Last Alcohol Screening Score (AUDIT): 0  Depression (PHQ2-9): Medium Risk   PHQ-2 Score: 6  Financial Resource Strain: Not on file  Food Insecurity: Not on file  Housing: Not on file  Physical Activity: Not on file  Social Connections: Not on file  Stress: Not on file  Tobacco Use: High Risk   Smoking Tobacco Use: Every Day   Smokeless Tobacco Use: Never  Transportation Needs: Not on file    [place]  Allergies  Allergen Reactions   Invokana [Canagliflozin] Palpitations, Rash and Other (See Comments)    Rash, tachycardia,weight loss    Plavix [Clopidogrel Bisulfate]     Upset stomach    Medications Reviewed Today     Reviewed by Edythe Clarity, [facility] (Pharmacist) on 06/26/20 at 1024  Med List  Status: <None>   Medication Order Taking? Sig Documenting Provider Last Dose Status Informant  ALPRAZolam (XANAX) 0.25 MG tablet [id number] Yes TAKE 2 TABLETS BY MOUTH THREE TIMES DAILY AS NEEDED [name], MD Taking Active   B-D ULTRAFINE III SHORT PEN 31G X 8 MM MISC [id number] Yes USE TO INJECT INSULIN EVERY DAY [name], MD Taking Active   Blood Glucose Monitoring Suppl [facility] VERIO) w/Device Drucie Opitz [id number] Yes USE TO TEST BLOOD SUGAR LEVELS THREE TIMES DAILY Orlena Sheldon, PA-C Taking Active Spouse/Significant Other  Cholecalciferol (DIALYVITE VITAMIN D 5000) 125 MCG (5000 UT) capsule [id number] Yes Take 5,000 Units by mouth daily. [provider] Taking Active   donepezil (ARICEPT) 10 MG tablet [id number] Yes TAKE 1 TABLET(10 MG) BY MOUTH AT BEDTIME [name], MD Taking Active   HYDROcodone-acetaminophen (NORCO/VICODIN) 5-325 MG tablet [id number] Yes Take 1 tablet by mouth every 6 (six) hours as needed for moderate pain (Must last 30 days). Sanjuana Kava, MD Taking Active   Lancets (ONETOUCH DELICA PLUS LPFXTK24O) [place] [id number] Yes USE TO CHECK BLOOD SUGAR TWICE DAILY. Dx: E11.9. [name], MD Taking Active   LANTUS SOLOSTAR 100 UNIT/ML Solostar Pen [id number] Yes ADMINISTER 15 UNITS UNDER THE SKIN DAILY  Patient taking differently: 50 Units.   [name], MD Taking Active   losartan (COZAAR) 50 MG tablet [id number] Yes TAKE 1 TABLET BY MOUTH EVERY NIGHT AT BEDTIME [name], MD Taking Active   memantine (NAMENDA) 10 MG tablet [id number] Yes TAKE 1 TABLET(10 MG) BY MOUTH TWICE DAILY [name], MD Taking Active   metFORMIN (GLUCOPHAGE) 1000 MG tablet [id number] Yes TAKE 1 TABLET(1000 MG) BY MOUTH TWICE DAILY WITH A MEAL [name], MD Taking Active   [facility] VERIO test strip [id number] Yes USE TO TEST BLOOD SUGAR 2 TO 3 TIMES DAILY [name], MD Taking Active   pioglitazone (ACTOS) 45 MG tablet [id number] Yes TAKE 1  TABLET(45 MG) BY MOUTH AT BEDTIME [name], MD Taking Active   rosuvastatin (CRESTOR) 40 MG tablet [id number] Yes TAKE 1 TABLET(40 MG) BY MOUTH DAILY [name], MD Taking Active             Patient Active Problem List   Diagnosis Date Noted   Vitamin D deficiency 05/17/2020   Cerebrovascular accident (CVA) ([place]) 05/17/2020   Carotid stenosis 06/15/2016   Carotid disease, bilateral ([place]) 04/12/2015   History of colonic polyps    Iron deficiency anemia 12/21/2013   Smoker 12/21/2013   Anxiety    Diabetes mellitus ([place])    Diabetes mellitus type 2, uncomplicated ([place])    Hyperlipemia    Hypertension    Cellulitis of foot, left 08/09/2010    Immunization History  Administered Date(s) Administered   Influenza, High Dose Seasonal PF 10/22/2016, 01/24/2018   Influenza,inj,Quad PF,6+ Mos 10/22/2014, 11/07/2015   Influenza-Unspecified 09/12/2013   Pneumococcal Conjugate-13 11/07/2015   Pneumococcal Polysaccharide-23 06/26/2014   Tdap 03/09/2016    Conditions to be addressed/monitored:  HTN, HLD, DM  Care Plan : General Pharmacy (Adult)  Updates made by Edythe Clarity, RPH since 06/26/2020 12:00 AM     Problem: HTN, DM   Priority: High  Onset Date: 02/23/2020     Long-Range Goal: Patient-Specific Goal   Start Date: 02/23/2020  Expected End Date: 08/22/2020  Recent Progress: On track  Priority: High  Note:    Current Barriers:  No specific barriers identified at this visit   Pharmacist Clinical Goal(s):  Over the next 90 days, patient will maintain control of blood glucose and blood pressure as evidenced by A1c, home monitoring  through collaboration with PharmD and provider.    Interventions: 1:1 collaboration with [name], MD regarding development and update of comprehensive plan of care as evidenced by provider attestation and co-signature Inter-disciplinary care team collaboration (see longitudinal plan of care) Comprehensive  medication review performed; medication list updated in electronic medical record  Hypertension (BP goal <130/80) -controlled -Current treatment: Losartan 50mg  -Medications previously tried: none noted  -Current home readings: doesn't check regularly -Current dietary habits: likes to snack, wife describes him as having sweet  tooth. -Current exercise habits: love to walk outside when weather is nice -Denies hypotensive/hypertensive symptoms -Educated on BP goals and benefits of medications for prevention of heart attack, stroke and kidney damage; Daily salt intake goal < 2300 mg; Importance of home blood pressure monitoring; -Counseled to monitor BP at home periodically, document, and provide log at future appointments -Recommended to continue current medication  Update 06/26/20 BP has been controlled they are still checking daily. Denies any symptoms Continue current medication  Diabetes (A1c goal <7%) -controlled -Current medications: Lantus 100u/ml 45-50 units daily - in the morning Pioglitazone 40m daily Metformin 10038mtwice daily with a meal -Medications previously tried: Trulicity  -Current home glucose readings fasting glucose: 94-150, one reading low 200s when they had a big meal the night before -Denies hypoglycemic/hyperglycemic symptoms -Current meal patterns:  breakfast:  lunch:  dinner:  snacks: low sugar pudding, low sugar fruit cup drinks:  -Current exercise: minimal, loves to walk outside when weather is nice -Educated onA1c and blood sugar goals; Prevention and management of hypoglycemic episodes; Benefits of routine self-monitoring of blood sugar; -Counseled to check feet daily and get yearly eye exams -Recommended to continue current medication Assessed current risk for hypoglycemia  Update 06/26/20 Sugar Average: 7 day -196; 14 days - 197;  90 days - 179 Does report a reading of 48 on 06/24/20 and two days prior sugar was 438. Continues to  fluctuate and reports he is eating the same thing daily.  Some days he is more active than others. They still have not gotten in touch with Endocrinologist - will consult with PCP on correcting referral to the [place] Patient may benefit from starting back on Trulicity with an empiric reduction in long acting insulin.  They have medicaid now so affordability should not be an issue.  Will consult with PCP on this and also continue referral to Endocrinology.  Patient Goals/Self-Care Activities Over the next 90 days, patient will:  - take medications as prescribed check glucose daily, document, and provide at future appointments check blood pressure periodically, document, and provide at future appointments  Follow Up Plan: The care management team will reach out to the patient again over the next 90 days.           Medication Assistance: None required.  Patient affirms current coverage meets needs.  Compliance/Adherence/Medication fill history: Care Gaps: Needs foot exam   [id number] [place] [facility] [id number]: [phone]: [phone] pill box? Yes Pt endorses 100% compliance  We discussed: Benefits of medication synchronization, packaging and delivery as well as enhanced pharmacist oversight with [employer]. Patient decided to: Continue current medication management strategy  Care Plan and Follow Up Patient Decision:  Patient agrees to Care Plan and Follow-up.  Plan: The care management team will reach out to the patient again over the next 90 days.  ChBeverly MilchPharmD Clinical Pharmacist [phone]

## 2020-06-26 ENCOUNTER — Ambulatory Visit (INDEPENDENT_AMBULATORY_CARE_PROVIDER_SITE_OTHER): Payer: PPO | Admitting: Pharmacist

## 2020-06-26 DIAGNOSIS — E119 Type 2 diabetes mellitus without complications: Secondary | ICD-10-CM

## 2020-06-26 DIAGNOSIS — I10 Essential (primary) hypertension: Secondary | ICD-10-CM

## 2020-06-26 DIAGNOSIS — Z794 Long term (current) use of insulin: Secondary | ICD-10-CM | POA: Diagnosis not present

## 2020-06-26 NOTE — Patient Instructions (Addendum)
Visit Information   Goals Addressed             This Visit's Progress    Monitor and Manage My Blood Sugar-Diabetes Type 2   Not on track    Timeframe:  Long-Range Goal Priority:  High Start Date:    02/23/20                         Expected End Date:   02/23/20                    Follow Up Date 08/10/20   - check blood sugar at prescribed times - enter blood sugar readings and medication or insulin into daily log - take the blood sugar log to all doctor visits    Why is this important?   Checking your blood sugar at home helps to keep it from getting very high or very low.  Writing the results in a diary or log helps the doctor know how to care for you.  Your blood sugar log should have the time, date and the results.  Also, write down the amount of insulin or other medicine that you take.  Other information, like what you ate, exercise done and how you were feeling, will also be helpful.     Notes: Contact providers with any hypoglycemia < 70  06/26/20 - BG still fluctuating, setting up with Endo, consider Trulicity        Patient Care Plan: General Pharmacy (Adult)     Problem Identified: HTN, DM   Priority: High  Onset Date: 02/23/2020     Long-Range Goal: Patient-Specific Goal   Start Date: 02/23/2020  Expected End Date: 08/22/2020  Recent Progress: On track  Priority: High  Note:    Current Barriers:  No specific barriers identified at this visit   Pharmacist Clinical Goal(s):  Over the next 90 days, patient will maintain control of blood glucose and blood pressure as evidenced by A1c, home monitoring  through collaboration with PharmD and provider.    Interventions: 1:1 collaboration with [name], MD regarding development and update of comprehensive plan of care as evidenced by provider attestation and co-signature Inter-disciplinary care team collaboration (see longitudinal plan of care) Comprehensive medication review performed; medication  list updated in electronic medical record  Hypertension (BP goal <130/80) -controlled -Current treatment: Losartan 50mg  -Medications previously tried: none noted  -Current home readings: doesn't check regularly -Current dietary habits: likes to snack, wife describes him as having sweet tooth. -Current exercise habits: love to walk outside when weather is nice -Denies hypotensive/hypertensive symptoms -Educated on BP goals and benefits of medications for prevention of heart attack, stroke and kidney damage; Daily salt intake goal < 2300 mg; Importance of home blood pressure monitoring; -Counseled to monitor BP at home periodically, document, and provide log at future appointments -Recommended to continue current medication  Update 06/26/20 BP has been controlled they are still checking daily. Denies any symptoms Continue current medication  Diabetes (A1c goal <7%) -controlled -Current medications: Lantus 100u/ml 45-50 units daily - in the morning Pioglitazone 45mg  daily Metformin 1000mg  twice daily with a meal -Medications previously tried: Trulicity  -Current home glucose readings fasting glucose: 94-150, one reading low 200s when they had a big meal the night before -Denies hypoglycemic/hyperglycemic symptoms -Current meal patterns:  breakfast:  lunch:  dinner:  snacks: low sugar pudding, low sugar fruit cup drinks:  -Current exercise: minimal, loves to walk outside  when weather is nice -Educated onA1c and blood sugar goals; Prevention and management of hypoglycemic episodes; Benefits of routine self-monitoring of blood sugar; -Counseled to check feet daily and get yearly eye exams -Recommended to continue current medication Assessed current risk for hypoglycemia  Update 06/26/20 Sugar Average: 7 day -196; 14 days - 197;  90 days - 179 Does report a reading of 48 on 06/24/20 and two days prior sugar was 438. Continues to fluctuate and reports he is eating the same  thing daily.  Some days he is more active than others. They still have not gotten in touch with Endocrinologist - will consult with PCP on correcting referral to the [place].  Patient may benefit from starting back on Trulicity with an empiric reduction in long acting insulin.  They have medicaid now so affordability should not be an issue.  Will consult with PCP on this and also continue referral to Endocrinology.  Patient Goals/Self-Care Activities Over the next 90 days, patient will:  - take medications as prescribed check glucose daily, document, and provide at future appointments check blood pressure periodically, document, and provide at future appointments  Follow Up Plan: The care management team will reach out to the patient again over the next 90 days.           The patient verbalized understanding of instructions, educational materials, and care plan provided today and declined offer to receive copy of patient instructions, educational materials, and care plan.  Telephone follow up appointment with pharmacy team member scheduled for: 3 months  Edythe Clarity, [place]
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
## 2020-06-28 ENCOUNTER — Ambulatory Visit (INDEPENDENT_AMBULATORY_CARE_PROVIDER_SITE_OTHER): Payer: PPO

## 2020-06-28 ENCOUNTER — Other Ambulatory Visit: Payer: Self-pay

## 2020-06-28 DIAGNOSIS — Z0000 Encounter for general adult medical examination without abnormal findings: Secondary | ICD-10-CM | POA: Diagnosis not present

## 2020-06-28 NOTE — Patient Instructions (Signed)
[name] [name] , Thank you for taking time to come for your Medicare Wellness Visit. I appreciate your ongoing commitment to your health goals. Please review the following plan we discussed and let me know if I can assist you in the future.   Screening recommendations/referrals: Colonoscopy: No longer required Recommended yearly ophthalmology/optometry visit for glaucoma screening and checkup Recommended yearly dental visit for hygiene and checkup  Vaccinations: Influenza vaccine: Up to date, next due fall 2022 Pneumococcal vaccine: Completed series  Tdap vaccine: Up to date, next due 03/09/2026 Shingles vaccine: Currently due for Shingrix, if you would like to receive we recommend that you do so at your local pharmacy.    Advanced directives: Advance directive discussed with you today. Even though you declined this today please call our office should you change your mind and we can give you the proper paperwork for you to fill out.   Conditions/risks identified: You qualify for a CT lung cancer screening based on your smoking history.   Next appointment: None   Preventive Care 65 Years and Older, Male Preventive care refers to lifestyle choices and visits with your health care provider that can promote health and wellness. What does preventive care include? A yearly physical exam. This is also called an annual well check. Dental exams once or twice a year. Routine eye exams. Ask your health care provider how often you should have your eyes checked. Personal lifestyle choices, including: Daily care of your teeth and gums. Regular physical activity. Eating a healthy diet. Avoiding tobacco and drug use. Limiting alcohol use. Practicing safe sex. Taking low doses of aspirin every day. Taking vitamin and mineral supplements as recommended by your health care provider. What happens during an annual well check? The services and screenings done by your health care provider during your annual  well check will depend on your age, overall health, lifestyle risk factors, and family history of disease. Counseling  Your health care provider may ask you questions about your: Alcohol use. Tobacco use. Drug use. Emotional well-being. Home and relationship well-being. Sexual activity. Eating habits. History of falls. Memory and ability to understand (cognition). Work and work Statistician. Screening  You may have the following tests or measurements: Height, weight, and BMI. Blood pressure. Lipid and cholesterol levels. These may be checked every 5 years, or more frequently if you are over 104 years old. Skin check. Lung cancer screening. You may have this screening every year starting at age 50 if you have a 30-pack-year history of smoking and currently smoke or have quit within the past 15 years. Fecal occult blood test (FOBT) of the stool. You may have this test every year starting at age 85. Flexible sigmoidoscopy or colonoscopy. You may have a sigmoidoscopy every 5 years or a colonoscopy every 10 years starting at age 16. Prostate cancer screening. Recommendations will vary depending on your family history and other risks. Hepatitis C blood test. Hepatitis B blood test. Sexually transmitted disease (STD) testing. Diabetes screening. This is done by checking your blood sugar (glucose) after you have not eaten for a while (fasting). You may have this done every 1-3 years. Abdominal aortic aneurysm (AAA) screening. You may need this if you are a current or former smoker. Osteoporosis. You may be screened starting at age 20 if you are at high risk. Talk with your health care provider about your test results, treatment options, and if necessary, the need for more tests. Vaccines  Your health care provider may recommend certain vaccines,  such as: Influenza vaccine. This is recommended every year. Tetanus, diphtheria, and acellular pertussis (Tdap, Td) vaccine. You may need a Td booster  every 10 years. Zoster vaccine. You may need this after age 50. Pneumococcal 13-valent conjugate (PCV13) vaccine. One dose is recommended after age 22. Pneumococcal polysaccharide (PPSV23) vaccine. One dose is recommended after age 43. Talk to your health care provider about which screenings and vaccines you need and how often you need them. This information is not intended to replace advice given to you by your health care provider. Make sure you discuss any questions you have with your health care provider. Document Released: 01/25/2015 Document Revised: 09/18/2015 Document Reviewed: 10/30/2014 Elsevier Interactive Patient Education  2017 [place] Prevention in the Home Falls can cause injuries. They can happen to people of all ages. There are many things you can do to make your home safe and to help prevent falls. What can I do on the outside of my home? Regularly fix the edges of walkways and driveways and fix any cracks. Remove anything that might make you trip as you walk through a door, such as a raised step or threshold. Trim any bushes or trees on the path to your home. Use bright outdoor lighting. Clear any walking paths of anything that might make someone trip, such as rocks or tools. Regularly check to see if handrails are loose or broken. Make sure that both sides of any steps have handrails. Any raised decks and porches should have guardrails on the edges. Have any leaves, snow, or ice cleared regularly. Use sand or salt on walking paths during winter. Clean up any spills in your garage right away. This includes oil or grease spills. What can I do in the bathroom? Use night lights. Install grab bars by the toilet and in the tub and shower. Do not use towel bars as grab bars. Use non-skid mats or decals in the tub or shower. If you need to sit down in the shower, use a plastic, non-slip stool. Keep the floor dry. Clean up any water that spills on the floor as soon  as it happens. Remove soap buildup in the tub or shower regularly. Attach bath mats securely with double-sided non-slip rug tape. Do not have throw rugs and other things on the floor that can make you trip. What can I do in the bedroom? Use night lights. Make sure that you have a light by your bed that is easy to reach. Do not use any sheets or blankets that are too big for your bed. They should not hang down onto the floor. Have a firm chair that has side arms. You can use this for support while you get dressed. Do not have throw rugs and other things on the floor that can make you trip. What can I do in the kitchen? Clean up any spills right away. Avoid walking on wet floors. Keep items that you use a lot in easy-to-reach places. If you need to reach something above you, use a strong step stool that has a grab bar. Keep electrical cords out of the way. Do not use floor polish or wax that makes floors slippery. If you must use wax, use non-skid floor wax. Do not have throw rugs and other things on the floor that can make you trip. What can I do with my stairs? Do not leave any items on the stairs. Make sure that there are handrails on both sides of the stairs and  use them. Fix handrails that are broken or loose. Make sure that handrails are as long as the stairways. Check any carpeting to make sure that it is firmly attached to the stairs. Fix any carpet that is loose or worn. Avoid having throw rugs at the top or bottom of the stairs. If you do have throw rugs, attach them to the floor with carpet tape. Make sure that you have a light switch at the top of the stairs and the bottom of the stairs. If you do not have them, ask someone to add them for you. What else can I do to help prevent falls? Wear shoes that: Do not have high heels. Have rubber bottoms. Are comfortable and fit you well. Are closed at the toe. Do not wear sandals. If you use a stepladder: Make sure that it is fully  opened. Do not climb a closed stepladder. Make sure that both sides of the stepladder are locked into place. Ask someone to hold it for you, if possible. Clearly mark and make sure that you can see: Any grab bars or handrails. First and last steps. Where the edge of each step is. Use tools that help you move around (mobility aids) if they are needed. These include: Canes. Walkers. Scooters. Crutches. Turn on the lights when you go into a dark area. Replace any light bulbs as soon as they burn out. Set up your furniture so you have a clear path. Avoid moving your furniture around. If any of your floors are uneven, fix them. If there are any pets around you, be aware of where they are. Review your medicines with your doctor. Some medicines can make you feel dizzy. This can increase your chance of falling. Ask your doctor what other things that you can do to help prevent falls. This information is not intended to replace advice given to you by your health care provider. Make sure you discuss any questions you have with your health care provider. Document Released: 10/25/2008 Document Revised: 06/06/2015 Document Reviewed: 02/02/2014 Elsevier Interactive Patient Education  2017 [employer].

## 2020-07-01 ENCOUNTER — Other Ambulatory Visit: Payer: Self-pay | Admitting: Family Medicine

## 2020-07-01 NOTE — Telephone Encounter (Signed)
Ok to refill??  Last office visit 05/17/2020.  Last refill 05/31/2020.

## 2020-07-11 ENCOUNTER — Telehealth: Payer: Self-pay | Admitting: Orthopaedic Surgery

## 2020-07-11 MED ORDER — HYDROCODONE-ACETAMINOPHEN 5-325 MG PO TABS: 1.0000 | ORAL_TABLET | Freq: Four times a day (QID) | ORAL | 0 refills | Status: DC | PRN

## 2020-07-11 NOTE — Telephone Encounter (Signed)
Patient requests  HYDROcodone-acetaminophen (NORCO/VICODIN) 5-325 MG tablet   Pharmacy:  [facility] on [address]

## 2020-07-17 ENCOUNTER — Telehealth: Payer: Self-pay | Admitting: Pharmacist

## 2020-07-17 NOTE — Progress Notes (Addendum)
Chronic Care Management Pharmacy Assistant   Name: JAMARQUES PINEDO  MRN: [id number] DOB: 1940/10/11  Reason for Encounter: Disease State For DM.   Conditions to be addressed/monitored: HTN, HLD, DM.  Recent office visits:  None since 06/26/20  Recent consult visits:  None since 06/26/20  Hospital visits:  None since 06/26/20  Medications: Outpatient Encounter Medications as of 07/17/2020  Medication Sig   ALPRAZolam (XANAX) 0.25 MG tablet TAKE 2 TABLETS BY MOUTH THREE TIMES DAILY AS NEEDED   B-D ULTRAFINE III SHORT PEN 31G X 8 MM MISC USE TO INJECT INSULIN EVERY DAY   Blood Glucose Monitoring Suppl (ONETOUCH VERIO) w/Device KIT USE TO TEST BLOOD SUGAR LEVELS THREE TIMES DAILY   Cholecalciferol (DIALYVITE VITAMIN D 5000) 125 MCG (5000 UT) capsule Take 5,000 Units by mouth daily.   donepezil (ARICEPT) 10 MG tablet TAKE 1 TABLET(10 MG) BY MOUTH AT BEDTIME   HYDROcodone-acetaminophen (NORCO/VICODIN) 5-325 MG tablet Take 1 tablet by mouth every 6 (six) hours as needed for moderate pain (Must last 30 days).   Lancets (ONETOUCH DELICA PLUS [id number]) MISC USE TO CHECK BLOOD SUGAR TWICE DAILY. Dx: E11.9.   LANTUS SOLOSTAR 100 UNIT/ML Solostar Pen ADMINISTER 15 UNITS UNDER THE SKIN DAILY (Patient taking differently: 50 Units.)   losartan (COZAAR) 50 MG tablet TAKE 1 TABLET BY MOUTH EVERY NIGHT AT BEDTIME   memantine (NAMENDA) 10 MG tablet TAKE 1 TABLET(10 MG) BY MOUTH TWICE DAILY   metFORMIN (GLUCOPHAGE) 1000 MG tablet TAKE 1 TABLET(1000 MG) BY MOUTH TWICE DAILY WITH A MEAL   ONETOUCH VERIO test strip USE TO TEST BLOOD SUGAR 2 TO 3 TIMES DAILY   pioglitazone (ACTOS) 45 MG tablet TAKE 1 TABLET(45 MG) BY MOUTH AT BEDTIME   rosuvastatin (CRESTOR) 40 MG tablet TAKE 1 TABLET(40 MG) BY MOUTH DAILY   No facility-administered encounter medications on file as of 07/17/2020.    Recent Relevant Labs: Lab Results  Component Value Date/Time   HGBA1C 9.2 (H) 05/17/2020 11:41 AM   HGBA1C 8.7 (H)  07/20/2019 12:41 PM   MICROALBUR 0.6 05/11/2016 09:51 AM   MICROALBUR <0.2 03/15/2015 04:31 PM    Kidney Function Lab Results  Component Value Date/Time   CREATININE 0.87 05/17/2020 11:41 AM   CREATININE 0.81 07/20/2019 12:41 PM   GFRNONAA 82 05/17/2020 11:41 AM   GFRAA 95 05/17/2020 11:41 AM    Current antihyperglycemic regimen:  Lantus 100u/ml 45-50 units daily - in the morning Pioglitazone 98m daily Metformin 10045mtwice daily with a meal  What recent interventions/DTPs have been made to improve glycemic control:  None.  Have there been any recent hospitalizations or ED visits since last visit with CPP? Patient stated no.  Patient reports hypoglycemic symptoms, including None  Patient denies hyperglycemic symptoms, including excessive thirst  How often are you checking your blood sugar? Patient has a blood sugar monitor connected to his arm.  What are your blood sugars ranging?  90 Day reading 184 30 Day reading 171  14 Day reading 32  7 Day reading 68  During the week, how often does your blood glucose drop below 70? Patients wife stated he often drops lower than 70 at bedtime.  Are you checking your feet daily/regularly?  Patients wife stated she checks them regularly.   Adherence Review: Is the patient currently on a STATIN medication? Rosuvastatin 40 mg 90 DS 03/22/20  Is the patient currently on ACE/ARB medication? N/A  Does the patient have >5 day gap between last estimated fill  dates? Per misc rpts, yes.  Patients wife has not seen/heard from the endocrinologist yet, I told her I would send a note to the nurse.   Star Rating Drugs: Rosuvastatin 40 mg 90 DS 03/22/20, Pioglitazone 45 mg 90 DS 03/22/20, Metformin 1000 mg 90 DS 05/06/20.  Follow-Up:Pharmacist Review   Charlann Lange, RMA Clinical Pharmacist Assistant [phone]  10 minutes spent in review, coordination, and documentation.  Reviewed by: Beverly Milch, PharmD Clinical  Pharmacist [facility] [phone]

## 2020-07-31 ENCOUNTER — Other Ambulatory Visit: Payer: Self-pay | Admitting: Family Medicine

## 2020-07-31 NOTE — Telephone Encounter (Signed)
Ok to refill??  Last office visit 05/17/2020.Marland Kitchen  Last refill 07/02/2020.

## 2020-08-13 ENCOUNTER — Telehealth: Payer: Self-pay | Admitting: Orthopaedic Surgery

## 2020-08-13 MED ORDER — HYDROCODONE-ACETAMINOPHEN 5-325 MG PO TABS: 1.0000 | ORAL_TABLET | Freq: Four times a day (QID) | ORAL | 0 refills | Status: DC | PRN

## 2020-08-13 NOTE — Telephone Encounter (Signed)
Patient/spouse called for refill:  acetaminophen (NORCO/VICODIN) 5-325 MG tablet 90 tablet      [place], [place]

## 2020-08-29 ENCOUNTER — Other Ambulatory Visit: Payer: Self-pay | Admitting: Family Medicine

## 2020-08-29 NOTE — Telephone Encounter (Signed)
Ok to refill??  Last office visit 05/17/2020.  Last refill 08/01/2020.  Ok to add refills to prescription?

## 2020-09-12 ENCOUNTER — Telehealth: Payer: Self-pay | Admitting: Family Medicine

## 2020-09-12 ENCOUNTER — Telehealth: Payer: Self-pay | Admitting: Orthopaedic Surgery

## 2020-09-12 MED ORDER — HYDROCODONE-ACETAMINOPHEN 5-325 MG PO TABS: 1.0000 | ORAL_TABLET | Freq: Four times a day (QID) | ORAL | 0 refills | Status: DC | PRN

## 2020-09-12 NOTE — Telephone Encounter (Signed)
Awaiting form

## 2020-09-12 NOTE — Telephone Encounter (Signed)
Patient requests refill: Hydocodone-acetaminophen (NORCO/VICODIN) 5-325 MG tablet 90 tablet  [place], [place]

## 2020-09-12 NOTE — Telephone Encounter (Signed)
Fax received with a time stamp of 2020-09-12 13:17:18. Form placed in provider's green folder for signature. Please fax back to [phone] or [phone], attn [place], BSW.

## 2020-09-12 NOTE — Telephone Encounter (Signed)
Received call from [place] with [employer] ([employer]) to follow up on request for bedside commode sent via e-fax on 08/20/20; response never received. Will resend efax today.  Please advise at [phone]

## 2020-09-15 ENCOUNTER — Emergency Department (HOSPITAL_COMMUNITY)
Admission: EM | Admit: 2020-09-15 | Discharge: 2020-09-15 | Disposition: A | Discharge status: Home / Self Care | Payer: PPO | Attending: Emergency Medicine | Admitting: Emergency Medicine

## 2020-09-15 ENCOUNTER — Emergency Department (HOSPITAL_COMMUNITY): Payer: PPO

## 2020-09-15 ENCOUNTER — Other Ambulatory Visit: Payer: Self-pay

## 2020-09-15 DIAGNOSIS — S32511A Fracture of superior rim of right pubis, initial encounter for closed fracture: Secondary | ICD-10-CM | POA: Diagnosis not present

## 2020-09-15 DIAGNOSIS — W19XXXA Unspecified fall, initial encounter: Secondary | ICD-10-CM | POA: Insufficient documentation

## 2020-09-15 DIAGNOSIS — M545 Low back pain, unspecified: Secondary | ICD-10-CM | POA: Diagnosis not present

## 2020-09-15 DIAGNOSIS — I10 Essential (primary) hypertension: Secondary | ICD-10-CM | POA: Diagnosis not present

## 2020-09-15 DIAGNOSIS — M1611 Unilateral primary osteoarthritis, right hip: Secondary | ICD-10-CM | POA: Diagnosis not present

## 2020-09-15 DIAGNOSIS — F0390 Unspecified dementia without behavioral disturbance: Secondary | ICD-10-CM | POA: Diagnosis not present

## 2020-09-15 DIAGNOSIS — Z7984 Long term (current) use of oral hypoglycemic drugs: Secondary | ICD-10-CM | POA: Diagnosis not present

## 2020-09-15 DIAGNOSIS — Z794 Long term (current) use of insulin: Secondary | ICD-10-CM | POA: Diagnosis not present

## 2020-09-15 DIAGNOSIS — Y92007 Garden or yard of unspecified non-institutional (private) residence as the place of occurrence of the external cause: Secondary | ICD-10-CM | POA: Insufficient documentation

## 2020-09-15 DIAGNOSIS — S3993XA Unspecified injury of pelvis, initial encounter: Secondary | ICD-10-CM | POA: Diagnosis present

## 2020-09-15 DIAGNOSIS — E119 Type 2 diabetes mellitus without complications: Secondary | ICD-10-CM | POA: Diagnosis not present

## 2020-09-15 DIAGNOSIS — Z79899 Other long term (current) drug therapy: Secondary | ICD-10-CM | POA: Insufficient documentation

## 2020-09-15 DIAGNOSIS — S32591A Other specified fracture of right pubis, initial encounter for closed fracture: Secondary | ICD-10-CM | POA: Diagnosis not present

## 2020-09-15 DIAGNOSIS — F17210 Nicotine dependence, cigarettes, uncomplicated: Secondary | ICD-10-CM | POA: Insufficient documentation
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
## 2020-09-15 NOTE — ED Notes (Signed)
Pt. returned from XR. 

## 2020-09-15 NOTE — ED Notes (Signed)
Patient transported to X-ray 

## 2020-09-15 NOTE — ED Triage Notes (Signed)
Pts wife states pt fell in the back yard yesterday & pt is c/o of right butt/hip pain. Pt has hx of dementia.

## 2020-09-15 NOTE — ED Notes (Signed)
Patient transported to CT 

## 2020-09-15 NOTE — Discharge Instructions (Addendum)
Minimal weightbearing as tolerated.  Continue to take your pain medication as directed.  Please use your quad cane for walking or standing.  Be sure to keep your appointment with Dr. [name] on Tuesday as previously scheduled.

## 2020-09-17 ENCOUNTER — Ambulatory Visit (INDEPENDENT_AMBULATORY_CARE_PROVIDER_SITE_OTHER): Payer: PPO | Admitting: Orthopaedic Surgery

## 2020-09-17 ENCOUNTER — Encounter: Payer: Self-pay | Admitting: Orthopaedic Surgery

## 2020-09-17 ENCOUNTER — Other Ambulatory Visit: Payer: Self-pay

## 2020-09-17 VITALS — BP 114/62 | HR 101 | Ht 69.0 in | Wt 152.0 lb

## 2020-09-17 DIAGNOSIS — S32591A Other specified fracture of right pubis, initial encounter for closed fracture: Secondary | ICD-10-CM | POA: Diagnosis not present
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
## 2020-09-19 DIAGNOSIS — R531 Weakness: Secondary | ICD-10-CM | POA: Diagnosis not present

## 2020-09-19 DIAGNOSIS — I639 Cerebral infarction, unspecified: Secondary | ICD-10-CM | POA: Diagnosis not present

## 2020-09-19 DIAGNOSIS — L0390 Cellulitis, unspecified: Secondary | ICD-10-CM | POA: Diagnosis not present

## 2020-10-01 ENCOUNTER — Ambulatory Visit: Payer: PPO

## 2020-10-01 ENCOUNTER — Ambulatory Visit (INDEPENDENT_AMBULATORY_CARE_PROVIDER_SITE_OTHER): Payer: PPO | Admitting: Orthopaedic Surgery

## 2020-10-01 ENCOUNTER — Other Ambulatory Visit: Payer: Self-pay

## 2020-10-01 ENCOUNTER — Encounter: Payer: Self-pay | Admitting: Orthopaedic Surgery

## 2020-10-01 DIAGNOSIS — S32591D Other specified fracture of right pubis, subsequent encounter for fracture with routine healing: Secondary | ICD-10-CM

## 2020-10-01 DIAGNOSIS — F0391 Unspecified dementia with behavioral disturbance: Secondary | ICD-10-CM

## 2020-10-01 NOTE — Progress Notes (Signed)
His dementia is acting up today  It took a long time to convince him to have X-rays today.  They were done.  Fracture is healing well.  He is able to stand and walk with walker.  Encounter Diagnoses  Name Primary?   Closed fracture of single ramus of right pubis with routine healing, subsequent encounter Yes   Dementia with behavioral disturbance, unspecified dementia type ([place])    Return in one month.  X-rays then.  Call if any problem.  Precautions discussed.  Electronically Signed Sanjuana Kava, MD 9/20/20223:58 PM

## 2020-10-02 NOTE — Progress Notes (Signed)
Chronic Care Management Pharmacy Note  10/03/2020 Name:  [name] [name] MRN:  [id number] DOB:  [birth year]  Subjective: [name] [name] is an 80 y.o. year old male who is a primary patient of [name], Cammie Mcgee, MD.  The CCM team was consulted for assistance with disease management and care coordination needs.    Engaged with patient by telephone for follow up visit in response to provider referral for pharmacy case management and/or care coordination services.   Consent to Services:  The patient was given the following information about Chronic Care Management services today, agreed to services, and gave verbal consent: 1. CCM service includes personalized support from designated clinical staff supervised by the primary care provider, including individualized plan of care and coordination with other care providers 2. 24/7 contact phone numbers for assistance for urgent and routine care needs. 3. Service will only be billed when office clinical staff spend 20 minutes or more in a month to coordinate care. 4. Only one practitioner may furnish and bill the service in a calendar month. 5.The patient may stop CCM services at any time (effective at the end of the month) by phone call to the office staff. 6. The patient will be responsible for cost sharing (co-pay) of up to 20% of the service fee (after annual deductible is met). Patient agreed to services and consent obtained.  Patient Care Team: [name], MD as PCP - General (Family Medicine) Edythe Clarity, [facility] as Pharmacist (Pharmacist) Edythe Clarity, [facility] as Pharmacist (Pharmacist)  Recent office visits: 05/17/20 [name]) - difficulty managing blood sugars.  Recommended he resume Trulicity, referral to endocrinology placed.  Recent consult visits: 06/18/20 [name]) - back pain, no changes to medication.  Hospital visits: None in previous 6 months   Objective:  Lab Results  Component Value Date   CREATININE 0.87  05/17/2020   BUN 13 05/17/2020   GFRNONAA 82 05/17/2020   GFRAA 95 05/17/2020   NA 141 05/17/2020   K 4.3 05/17/2020   CALCIUM 9.6 05/17/2020   CO2 28 05/17/2020   GLUCOSE 117 (H) 05/17/2020    Lab Results  Component Value Date/Time   HGBA1C 9.2 (H) 05/17/2020 11:41 AM   HGBA1C 8.7 (H) 07/20/2019 12:41 PM   MICROALBUR 0.6 05/11/2016 09:51 AM   MICROALBUR <0.2 03/15/2015 04:31 PM    Last diabetic Eye exam:  Lab Results  Component Value Date/Time   HMDIABEYEEXA No Retinopathy 11/02/2019 04:35 PM    Last diabetic Foot exam: No results found for: HMDIABFOOTEX   Lab Results  Component Value Date   CHOL 136 05/17/2020   HDL 53 05/17/2020   LDLCALC 65 05/17/2020   LDLDIRECT 109 (H) 04/07/2017   TRIG 99 05/17/2020   CHOLHDL 2.6 05/17/2020    Hepatic Function Latest Ref Rng & Units 05/17/2020 07/20/2019 10/06/2018  Total Protein 6.1 - 8.1 g/dL 6.5 6.8 6.1  Albumin 3.5 - 5.0 g/dL - - -  AST 10 - 35 U/L '19 18 19  ' ALT 9 - 46 U/L '17 16 17  ' Alk Phosphatase 38 - 126 U/L - - -  Total Bilirubin 0.2 - 1.2 mg/dL 0.4 0.4 0.3    Lab Results  Component Value Date/Time   TSH 2.45 11/07/2015 08:43 AM    CBC Latest Ref Rng & Units 05/17/2020 07/20/2019 10/06/2018  WBC 3.8 - 10.8 Thousand/uL 8.7 10.7 12.0(H)  Hemoglobin 13.2 - 17.1 g/dL 12.9(L) 13.9 13.0(L)  Hematocrit 38.5 - 50.0 % 40.4 41.6 38.8  Platelets  140 - 400 Thousand/uL 244 222 222    Lab Results  Component Value Date/Time   VD25OH 58 05/17/2020 11:41 AM    Clinical ASCVD: No  The ASCVD Risk score (Arnett DK, et al., 2019) failed to calculate for the following reasons:   The patient has a prior MI or stroke diagnosis    Depression screen [facility] 2/9 06/28/2020 05/17/2020 08/17/2017  Decreased Interest 0 2 2  Down, Depressed, Hopeless 0 0 0  PHQ - 2 Score 0 2 2  Altered sleeping 0 1 1  Tired, decreased energy 0 1 3  Change in appetite 0 0 0  Feeling bad or failure about yourself  0 0 0  Trouble concentrating 0 2 0  Moving slowly  or fidgety/restless 0 0 1  Suicidal thoughts 0 0 0  PHQ-9 Score 0 6 7  Difficult doing work/chores Not difficult at all Somewhat difficult Very difficult  Some recent data might be hidden     Social History   Tobacco Use  Smoking Status Every Day   Packs/day: 1.00   Years: 60.00   Pack years: 60.00   Types: Cigarettes  Smokeless Tobacco Never   BP Readings from Last 3 Encounters:  09/17/20 114/62  09/15/20 108/88  06/18/20 (!) 177/73   Pulse Readings from Last 3 Encounters:  09/17/20 (!) 101  09/15/20 70  06/18/20 93   Wt Readings from Last 3 Encounters:  09/17/20 152 lb (68.9 kg)  09/15/20 152 lb (68.9 kg)  06/18/20 159 lb (72.1 kg)   BMI Readings from Last 3 Encounters:  09/17/20 22.45 kg/m  09/15/20 22.45 kg/m  06/18/20 21.56 kg/m    Assessment/Interventions: Review of patient past medical history, allergies, medications, health status, including review of consultants reports, laboratory and other test data, was performed as part of comprehensive evaluation and provision of chronic care management services.   SDOH:  (Social Determinants of Health) assessments and interventions performed: Yes  SDOH Screenings   Alcohol Screen: Low Risk    Last Alcohol Screening Score (AUDIT): 0  Depression (PHQ2-9): Low Risk    PHQ-2 Score: 0  Financial Resource Strain: Low Risk    Difficulty of Paying Living Expenses: Not hard at all  Food Insecurity: No Food Insecurity   Worried About Charity fundraiser in the Last Year: Never true   Ran Out of Food in the Last Year: Never true  Housing: Low Risk    Last Housing Risk Score: 0  Physical Activity: Inactive   Days of Exercise per Week: 0 days   Minutes of Exercise per Session: 0 min  Social Connections: Moderately Integrated   Frequency of Communication with Friends and Family: More than three times a week   Frequency of Social Gatherings with Friends and Family: Three times a week   Attends Religious Services: More  than 4 times per year   Active Member of Clubs or Organizations: No   Attends Archivist Meetings: Never   Marital Status: Married  Stress: No Stress Concern Present   Feeling of Stress : Not at all  Tobacco Use: High Risk   Smoking Tobacco Use: Every Day   Smokeless Tobacco Use: Never  Transportation Needs: No Transportation Needs   Lack of Transportation (Medical): No   Lack of Transportation (Non-Medical): No    CCM Care Plan  Allergies  Allergen Reactions   Invokana [Canagliflozin] Palpitations, Rash and Other (See Comments)    Rash, tachycardia,weight loss    Plavix [Clopidogrel  Bisulfate]     Upset stomach    Medications Reviewed Today     Reviewed by Sanjuana Kava, MD (Physician) on 10/01/20 at 1554  Med List Status: <None>   Medication Order Taking? Sig Documenting Provider Last Dose Status Informant  ALPRAZolam (XANAX) 0.25 MG tablet [id number] Yes TAKE 1 TABLET BY MOUTH THREE TIMES DAILY AS NEEDED [name], MD Taking Active   B-D ULTRAFINE III SHORT PEN 31G X 8 MM MISC [id number] Yes USE TO INJECT INSULIN EVERY DAY [name], MD Taking Active   Blood Glucose Monitoring Suppl [facility] VERIO) w/Device Drucie Opitz [id number] Yes USE TO TEST BLOOD SUGAR LEVELS THREE TIMES DAILY Orlena Sheldon, PA-C Taking Active Spouse/Significant Other  Cholecalciferol (DIALYVITE VITAMIN D 5000) 125 MCG (5000 UT) capsule [id number] Yes Take 5,000 Units by mouth daily. [provider] Taking Active   donepezil (ARICEPT) 10 MG tablet [id number] Yes TAKE 1 TABLET(10 MG) BY MOUTH AT BEDTIME [name], MD Taking Active   HYDROcodone-acetaminophen (NORCO/VICODIN) 5-325 MG tablet [id number] Yes Take 1 tablet by mouth every 6 (six) hours as needed for moderate pain (Must last 30 days). Sanjuana Kava, MD Taking Active   Lancets (ONETOUCH DELICA PLUS VUYEBX43H) [place] [id number] Yes USE TO CHECK BLOOD SUGAR TWICE DAILY. Dx: E11.9. [name], MD Taking Active    LANTUS SOLOSTAR 100 UNIT/ML Solostar Pen [id number] Yes ADMINISTER 15 UNITS UNDER THE SKIN DAILY  Patient taking differently: 50 Units.   [name], MD Taking Active   losartan (COZAAR) 50 MG tablet [id number] Yes TAKE 1 TABLET BY MOUTH EVERY NIGHT AT BEDTIME [name], MD Taking Active   memantine (NAMENDA) 10 MG tablet [id number] Yes TAKE 1 TABLET(10 MG) BY MOUTH TWICE DAILY [name], MD Taking Active   metFORMIN (GLUCOPHAGE) 1000 MG tablet [id number] Yes TAKE 1 TABLET(1000 MG) BY MOUTH TWICE DAILY WITH A MEAL [name], MD Taking Active   [address] [facility] VERIO test strip [id number] Yes USE TO TEST BLOOD SUGAR 2 TO 3 TIMES DAILY [name], MD Taking Active   pioglitazone (ACTOS) 45 MG tablet [id number] Yes TAKE 1 TABLET(45 MG) BY MOUTH AT BEDTIME [name], MD Taking Active   rosuvastatin (CRESTOR) 40 MG tablet [id number] Yes TAKE 1 TABLET(40 MG) BY MOUTH DAILY [name], MD Taking Active             Patient Active Problem List   Diagnosis Date Noted   Vitamin D deficiency 05/17/2020   Cerebrovascular accident (CVA) ([place]) 05/17/2020   Carotid stenosis 06/15/2016   Carotid disease, bilateral ([place]) 04/12/2015   History of colonic polyps    Iron deficiency anemia 12/21/2013   Smoker 12/21/2013   Anxiety    Diabetes mellitus ([place])    Diabetes mellitus type 2, uncomplicated ([place])    Hyperlipemia    Hypertension    Cellulitis of foot, left 08/09/2010    Immunization History  Administered Date(s) Administered   Influenza, High Dose Seasonal PF 10/22/2016, 01/24/2018   Influenza,inj,Quad PF,6+ Mos 10/22/2014, 11/07/2015   Influenza-Unspecified 09/12/2013   Pneumococcal Conjugate-13 11/07/2015   Pneumococcal Polysaccharide-23 06/26/2014   Tdap 03/09/2016    Conditions to be addressed/monitored:  HTN, HLD, DM  Care Plan : General Pharmacy (Adult)  Updates made by Edythe Clarity, RPH since 10/03/2020 12:00 AM     Problem: HTN,  DM   Priority: High  Onset Date: 02/23/2020     Long-Range Goal: Patient-Specific Goal   Start Date: 02/23/2020  Expected End Date: 08/22/2020  Recent Progress: On track  Priority: High  Note:    Current Barriers:  No specific barriers identified at this visit   Pharmacist Clinical Goal(s):  Over the next 90 days, patient will maintain control of blood glucose and blood pressure as evidenced by A1c, home monitoring  through collaboration with PharmD and provider.    Interventions: 1:1 collaboration with [name], MD regarding development and update of comprehensive plan of care as evidenced by provider attestation and co-signature Inter-disciplinary care team collaboration (see longitudinal plan of care) Comprehensive medication review performed; medication list updated in electronic medical record  Hypertension (BP goal <130/80) -controlled -Current treatment: Losartan 2m -Medications previously tried: none noted  -Current home readings: doesn't check regularly -Current dietary habits: likes to snack, wife describes him as having sweet tooth. -Current exercise habits: love to walk outside when weather is nice -Denies hypotensive/hypertensive symptoms -Educated on BP goals and benefits of medications for prevention of heart attack, stroke and kidney damage; Daily salt intake goal < 2300 mg; Importance of home blood pressure monitoring; -Counseled to monitor BP at home periodically, document, and provide log at future appointments -Recommended to continue current medication  Update 06/26/20 BP has been controlled they are still checking daily. Denies any symptoms Continue current medication  Update 10/03/20 No issues with BP, they continue to monitor No dizziness or HA's Continue current meds for now  Diabetes (A1c goal <7%) -controlled -Current medications: Lantus 100u/ml 45-50 units daily - in the morning Pioglitazone 470mdaily Metformin 100072mwice  daily with a meal -Medications previously tried: Trulicity  -Current home glucose readings fasting glucose: 94-150, one reading low 200s when they had a big meal the night before -Denies hypoglycemic/hyperglycemic symptoms -Current meal patterns:  breakfast:  lunch:  dinner:  snacks: low sugar pudding, low sugar fruit cup drinks:  -Current exercise: minimal, loves to walk outside when weather is nice -Educated onA1c and blood sugar goals; Prevention and management of hypoglycemic episodes; Benefits of routine self-monitoring of blood sugar; -Counseled to check feet daily and get yearly eye exams -Recommended to continue current medication Assessed current risk for hypoglycemia  Update 06/26/20 Sugar Average: 7 day -196; 14 days - 197;  90 days - 179 Does report a reading of 48 on 06/24/20 and two days prior sugar was 438. Continues to fluctuate and reports he is eating the same thing daily.  Some days he is more active than others. They still have not gotten in touch with Endocrinologist - will consult with PCP on correcting referral to the [facility] may benefit from starting back on Trulicity with an empiric reduction in long acting insulin.  They have medicaid now so affordability should not be an issue. Will consult with PCP on this and also continue referral to Endocrinology.  Update 10/03/20 Sugars have been ranging from around 180-280 since patient fell and fractured his pelvis.  Wife is having a hard time getting him to agree to take his Lantus which has led to the elevated sugars.  They also never heard from [facility] scheduling a visit.  As long as sugars do not shoot up into the 300-400 again patient is OK to recover at home.  Encouraged to give Lantus as much as he will allow.  Will follow up on Endo referral.  No changes to meds at this time  Patient Goals/Self-Care Activities Over the next 90 days, patient will:  - take medications  as prescribed check  glucose daily, document, and provide at future appointments check blood pressure periodically, document, and provide at future appointments  Follow Up Plan: The care management team will reach out to the patient again over the next 90 days.            Medication Assistance: None required.  Patient affirms current coverage meets needs.  Compliance/Adherence/Medication fill history: Care Gaps: Needs foot exam   Patient's preferred pharmacy is:  [id number] - [place], [place] - [address] AT [name] [place] [address] Phone: [phone] Fax: [phone]  Uses pill box? Yes Pt endorses 100% compliance  We discussed: Benefits of medication synchronization, packaging and delivery as well as enhanced pharmacist oversight with [employer]. Patient decided to: Continue current medication management strategy  Care Plan and Follow Up Patient Decision:  Patient agrees to Care Plan and Follow-up.  Plan: The care management team will reach out to the patient again over the next 90 days.  Beverly Milch, PharmD Clinical Pharmacist [place] [phone]

## 2020-10-03 ENCOUNTER — Ambulatory Visit (INDEPENDENT_AMBULATORY_CARE_PROVIDER_SITE_OTHER): Payer: PPO | Admitting: Pharmacist

## 2020-10-03 DIAGNOSIS — Z794 Long term (current) use of insulin: Secondary | ICD-10-CM

## 2020-10-03 DIAGNOSIS — E119 Type 2 diabetes mellitus without complications: Secondary | ICD-10-CM

## 2020-10-03 DIAGNOSIS — I10 Essential (primary) hypertension: Secondary | ICD-10-CM

## 2020-10-03 NOTE — Patient Instructions (Addendum)
Visit Information   Goals Addressed             This Visit's Progress    Monitor and Manage My Blood Sugar-Diabetes Type 2   On track    Timeframe:  Long-Range Goal Priority:  High Start Date:    02/23/20                         Expected End Date:   02/23/20                    Follow Up Date 08/10/20   - check blood sugar at prescribed times - enter blood sugar readings and medication or insulin into daily log - take the blood sugar log to all doctor visits    Why is this important?   Checking your blood sugar at home helps to keep it from getting very high or very low.  Writing the results in a diary or log helps the doctor know how to care for you.  Your blood sugar log should have the time, date and the results.  Also, write down the amount of insulin or other medicine that you take.  Other information, like what you ate, exercise done and how you were feeling, will also be helpful.     Notes: Contact providers with any hypoglycemia < 70  06/26/20 - BG still fluctuating, setting up with Endo, consider Trulicity       Patient Care Plan: General Pharmacy (Adult)     Problem Identified: HTN, DM   Priority: High  Onset Date: 02/23/2020     Long-Range Goal: Patient-Specific Goal   Start Date: 02/23/2020  Expected End Date: 08/22/2020  Recent Progress: On track  Priority: High  Note:    Current Barriers:  No specific barriers identified at this visit   Pharmacist Clinical Goal(s):  Over the next 90 days, patient will maintain control of blood glucose and blood pressure as evidenced by A1c, home monitoring  through collaboration with PharmD and provider.    Interventions: 1:1 collaboration with [name], MD regarding development and update of comprehensive plan of care as evidenced by provider attestation and co-signature Inter-disciplinary care team collaboration (see longitudinal plan of care) Comprehensive medication review performed; medication list  updated in electronic medical record  Hypertension (BP goal <130/80) -controlled -Current treatment: Losartan 50mg  -Medications previously tried: none noted  -Current home readings: doesn't check regularly -Current dietary habits: likes to snack, wife describes him as having sweet tooth. -Current exercise habits: love to walk outside when weather is nice -Denies hypotensive/hypertensive symptoms -Educated on BP goals and benefits of medications for prevention of heart attack, stroke and kidney damage; Daily salt intake goal < 2300 mg; Importance of home blood pressure monitoring; -Counseled to monitor BP at home periodically, document, and provide log at future appointments -Recommended to continue current medication  Update 06/26/20 BP has been controlled they are still checking daily. Denies any symptoms Continue current medication  Update 10/03/20 No issues with BP, they continue to monitor No dizziness or HA's Continue current meds for now  Diabetes (A1c goal <7%) -controlled -Current medications: Lantus 100u/ml 45-50 units daily - in the morning Pioglitazone 45mg  daily Metformin 1000mg  twice daily with a meal -Medications previously tried: Trulicity  -Current home glucose readings fasting glucose: 94-150, one reading low 200s when they had a big meal the night before -Denies hypoglycemic/hyperglycemic symptoms -Current meal patterns:  breakfast:  lunch:  dinner:  snacks: low sugar pudding, low sugar fruit cup drinks:  -Current exercise: minimal, loves to walk outside when weather is nice -Educated onA1c and blood sugar goals; Prevention and management of hypoglycemic episodes; Benefits of routine self-monitoring of blood sugar; -Counseled to check feet daily and get yearly eye exams -Recommended to continue current medication Assessed current risk for hypoglycemia  Update 06/26/20 Sugar Average: 7 day -196; 14 days - 197;  90 days - 179 Does report a reading  of 48 on 06/24/20 and two days prior sugar was 438. Continues to fluctuate and reports he is eating the same thing daily.  Some days he is more active than others. They still have not gotten in touch with Endocrinologist - will consult with PCP on correcting referral to the [place]. Patient may benefit from starting back on Trulicity with an empiric reduction in long acting insulin.  They have medicaid now so affordability should not be an issue. Will consult with PCP on this and also continue referral to Endocrinology.  Update 10/03/20 Sugars have been ranging from around 180-280 since patient fell and fractured his pelvis.  Wife is having a hard time getting him to agree to take his Lantus which has led to the elevated sugars.  They also never heard from [place] about scheduling a visit.  As long as sugars do not shoot up into the 300-400 again patient is OK to recover at home.  Encouraged to give Lantus as much as he will allow.  Will follow up on Endo referral.  No changes to meds at this time  Patient Goals/Self-Care Activities Over the next 90 days, patient will:  - take medications as prescribed check glucose daily, document, and provide at future appointments check blood pressure periodically, document, and provide at future appointments  Follow Up Plan: The care management team will reach out to the patient again over the next 90 days.           Patient verbalizes understanding of instructions provided today and agrees to view in [place].  Telephone follow up appointment with pharmacy team member scheduled for: 4 months  Edythe Clarity, [place]

## 2020-10-08 ENCOUNTER — Ambulatory Visit (INDEPENDENT_AMBULATORY_CARE_PROVIDER_SITE_OTHER): Payer: PPO | Admitting: Orthopaedic Surgery

## 2020-10-08 ENCOUNTER — Telehealth: Payer: Self-pay | Admitting: Orthopaedic Surgery

## 2020-10-08 ENCOUNTER — Encounter: Payer: Self-pay | Admitting: Orthopaedic Surgery

## 2020-10-08 DIAGNOSIS — F0391 Unspecified dementia with behavioral disturbance: Secondary | ICD-10-CM | POA: Diagnosis not present

## 2020-10-08 DIAGNOSIS — S32591D Other specified fracture of right pubis, subsequent encounter for fracture with routine healing: Secondary | ICD-10-CM

## 2020-10-08 MED ORDER — HYDROCODONE-ACETAMINOPHEN 5-325 MG PO TABS: 1.0000 | ORAL_TABLET | Freq: Four times a day (QID) | ORAL | 0 refills | Status: DC | PRN

## 2020-10-08 NOTE — Progress Notes (Signed)
Virtual Visit via Telephone Note  I connected withNAME@ on 10/08/20 at 11:10 AM EDT by telephone and verified that I am speaking with the correct person using two identifiers.  Location: Patient: home Provider: office   I discussed the limitations, risks, security and privacy concerns of performing an evaluation and management service by telephone and the availability of in person appointments. I also discussed with the patient that there may be a patient responsible charge related to this service. The patient expressed understanding and agreed to proceed.   History of Present Illness: He has been getting up with less pain of the hip and pelvis.  He has had less behavorial problems this past week.  He has no new falls.   Observations/Objective: Per above.  Assessment and Plan: Encounter Diagnoses  Name Primary?   Closed fracture of single ramus of right pubis with routine healing, subsequent encounter Yes   Dementia with behavioral disturbance, unspecified dementia type ([place])       Follow Up Instructions: Five weeks for X-rays of the pelvis.   I discussed the assessment and treatment plan with the patient. The patient was provided an opportunity to ask questions and all were answered. The patient agreed with the plan and demonstrated an understanding of the instructions.   The patient was advised to call back or seek an in-person evaluation if the symptoms worsen or if the condition fails to improve as anticipated.  I provided 8 minutes of non-face-to-face time during this encounter.   Sanjuana Kava, MD

## 2020-10-08 NOTE — Telephone Encounter (Signed)
Patient/spouse, designated party on file, had the virtual visit today with Dr [name], and said forgot to ask for refill: HYDROcodone-acetaminophen (NORCO/VICODIN) 5-325 MG tablet 90 tablet       [employer], [employer]

## 2020-10-11 DIAGNOSIS — Z794 Long term (current) use of insulin: Secondary | ICD-10-CM | POA: Diagnosis not present

## 2020-10-11 DIAGNOSIS — E119 Type 2 diabetes mellitus without complications: Secondary | ICD-10-CM | POA: Diagnosis not present

## 2020-10-11 DIAGNOSIS — I10 Essential (primary) hypertension: Secondary | ICD-10-CM | POA: Diagnosis not present

## 2020-10-21 ENCOUNTER — Other Ambulatory Visit: Payer: Self-pay | Admitting: Family Medicine

## 2020-10-31 ENCOUNTER — Telehealth: Payer: Self-pay

## 2020-10-31 NOTE — Telephone Encounter (Signed)
Hydrocodone-Acetaminophen 5/325 mg Qty 75 Tablets  PATIENT USES [facility] [address]

## 2020-11-04 DIAGNOSIS — E119 Type 2 diabetes mellitus without complications: Secondary | ICD-10-CM | POA: Diagnosis not present

## 2020-11-04 LAB — HM DIABETES EYE EXAM

## 2020-11-05 ENCOUNTER — Ambulatory Visit (INDEPENDENT_AMBULATORY_CARE_PROVIDER_SITE_OTHER): Payer: PPO | Admitting: "Endocrinology

## 2020-11-05 ENCOUNTER — Encounter: Payer: Self-pay | Admitting: "Endocrinology

## 2020-11-05 ENCOUNTER — Other Ambulatory Visit: Payer: Self-pay

## 2020-11-05 VITALS — BP 82/48 | HR 84 | Ht 69.0 in | Wt 134.0 lb

## 2020-11-05 DIAGNOSIS — E119 Type 2 diabetes mellitus without complications: Secondary | ICD-10-CM

## 2020-11-05 DIAGNOSIS — Z794 Long term (current) use of insulin: Secondary | ICD-10-CM

## 2020-11-05 LAB — POCT GLYCOSYLATED HEMOGLOBIN (HGB A1C): HbA1c, POC (controlled diabetic range): 12.1 % — AB (ref 0.0–7.0)

## 2020-11-05 MED ORDER — LANTUS SOLOSTAR 100 UNIT/ML ~~LOC~~ SOPN: 40.0000 [IU] | PEN_INJECTOR | Freq: Every day | SUBCUTANEOUS | 2 refills | Status: DC

## 2020-11-05 MED ORDER — PIOGLITAZONE HCL 30 MG PO TABS: 30.0000 mg | ORAL_TABLET | Freq: Every day | ORAL | 3 refills | Status: DC
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
## 2020-11-05 NOTE — Patient Instructions (Signed)

## 2020-11-12 ENCOUNTER — Encounter: Payer: Self-pay | Admitting: Orthopaedic Surgery

## 2020-11-12 ENCOUNTER — Ambulatory Visit (INDEPENDENT_AMBULATORY_CARE_PROVIDER_SITE_OTHER): Payer: PPO | Admitting: Orthopaedic Surgery

## 2020-11-12 ENCOUNTER — Ambulatory Visit: Payer: PPO

## 2020-11-12 ENCOUNTER — Other Ambulatory Visit: Payer: Self-pay

## 2020-11-12 ENCOUNTER — Telehealth: Payer: Self-pay

## 2020-11-12 DIAGNOSIS — S32591D Other specified fracture of right pubis, subsequent encounter for fracture with routine healing: Secondary | ICD-10-CM

## 2020-11-12 MED ORDER — HYDROCODONE-ACETAMINOPHEN 5-325 MG PO TABS: 1.0000 | ORAL_TABLET | Freq: Four times a day (QID) | ORAL | 0 refills | Status: DC | PRN

## 2020-11-12 NOTE — Telephone Encounter (Signed)
Patient's wife [name]) called asking why did you cut his medication back. Stated that he does ok with the 75 tablets even though he takes Tylenol in between doses, but the 65 tablets just isn't going to enough to keep him from hollering at her that he hurts.  Please advise

## 2020-11-12 NOTE — Progress Notes (Signed)
I am better.  He is not that confused today.  NV intact.  He is walking with walker.  He has no pain.  X-rays were done of the pelvis, reported separately.  Encounter Diagnosis  Name Primary?   Closed fracture of single ramus of right pubis with routine healing, subsequent encounter Yes   I will see in three months.  Call if any problem.  Precautions discussed.  I have reviewed the [place] web site prior to prescribing narcotic medicine for this patient.  Electronically Signed Sanjuana Kava, MD 11/1/202210:15 AM

## 2020-11-13 NOTE — Telephone Encounter (Signed)
Called patient's wife and relayed Dr. Brooke Bonito reply. Stated she understood, but he can't help his state of dementia. I told her we did understand.

## 2020-11-19 ENCOUNTER — Encounter: Payer: Self-pay | Admitting: "Endocrinology

## 2020-11-19 ENCOUNTER — Ambulatory Visit (INDEPENDENT_AMBULATORY_CARE_PROVIDER_SITE_OTHER): Payer: PPO | Admitting: "Endocrinology

## 2020-11-19 ENCOUNTER — Other Ambulatory Visit: Payer: Self-pay

## 2020-11-19 VITALS — BP 104/56 | HR 88 | Ht 69.0 in | Wt 137.0 lb

## 2020-11-19 DIAGNOSIS — E119 Type 2 diabetes mellitus without complications: Secondary | ICD-10-CM | POA: Diagnosis not present

## 2020-11-19 DIAGNOSIS — Z794 Long term (current) use of insulin: Secondary | ICD-10-CM | POA: Diagnosis not present

## 2020-11-19 MED ORDER — METFORMIN HCL 500 MG PO TABS: 500.0000 mg | ORAL_TABLET | Freq: Two times a day (BID) | ORAL | 1 refills | Status: DC

## 2020-11-19 MED ORDER — LOSARTAN POTASSIUM 25 MG PO TABS: 25.0000 mg | ORAL_TABLET | Freq: Every day | ORAL | 1 refills | Status: DC

## 2020-11-19 MED ORDER — LANTUS SOLOSTAR 100 UNIT/ML ~~LOC~~ SOPN: 10.0000 [IU] | PEN_INJECTOR | Freq: Every day | SUBCUTANEOUS | 1 refills | Status: DC

## 2020-11-19 NOTE — Patient Instructions (Signed)
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
## 2020-11-21 ENCOUNTER — Other Ambulatory Visit: Payer: Self-pay | Admitting: Family Medicine

## 2020-11-23 ENCOUNTER — Other Ambulatory Visit: Payer: Self-pay | Admitting: Family Medicine

## 2020-12-11 ENCOUNTER — Telehealth: Payer: Self-pay | Admitting: Orthopaedic Surgery

## 2020-12-11 NOTE — Telephone Encounter (Signed)
Patient/Designated contact [name], wife, called for refill HYDROcodone-acetaminophen (NORCO/VICODIN) 5-325 MG tablet 65 tablet       [employer], [employer], [employer]

## 2020-12-12 MED ORDER — HYDROCODONE-ACETAMINOPHEN 5-325 MG PO TABS: 1.0000 | ORAL_TABLET | Freq: Four times a day (QID) | ORAL | 0 refills | Status: DC | PRN

## 2020-12-17 ENCOUNTER — Telehealth: Payer: Self-pay | Admitting: Pharmacist
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
## 2020-12-31 ENCOUNTER — Other Ambulatory Visit: Payer: Self-pay | Admitting: Family Medicine

## 2021-01-09 ENCOUNTER — Other Ambulatory Visit: Payer: Self-pay | Admitting: Orthopedic Surgery

## 2021-01-09 ENCOUNTER — Telehealth: Payer: Self-pay | Admitting: Orthopaedic Surgery

## 2021-01-09 MED ORDER — HYDROCODONE-ACETAMINOPHEN 5-325 MG PO TABS: 1.0000 | ORAL_TABLET | Freq: Four times a day (QID) | ORAL | 0 refills | Status: AC | PRN

## 2021-01-09 NOTE — Telephone Encounter (Signed)
Patient called for refill / aware that while Dr [name] is out of clinic this week, refill requests are being reviewed by other provider in clinic  HYDROcodone-acetaminophen (NORCO/VICODIN) 5-325 MG tablet 65 tablet       [employer], [address] [employer]

## 2021-01-14 ENCOUNTER — Telehealth: Payer: Self-pay | Admitting: Orthopaedic Surgery

## 2021-01-14 NOTE — Telephone Encounter (Signed)
Patient wife called and left a voicemail stating [name] medication will not last for 30 days.  Dr. Aline Brochure wrote the script for 28 tabs, this will not last him a month.  She wants to know how to go about and getting the rest of them.    Please Advise [name] [name].

## 2021-01-21 ENCOUNTER — Other Ambulatory Visit: Payer: Self-pay | Admitting: Family Medicine

## 2021-01-23 ENCOUNTER — Telehealth: Payer: Self-pay | Admitting: Orthopaedic Surgery

## 2021-01-23 NOTE — Telephone Encounter (Signed)
Patient/spouse, designated contact, called to request refill - states should be due as last prescription by Dr Aline Brochure while Dr [name] was for lesser number of pills:  HYDROcodone-acetaminophen (NORCO/VICODIN) 5-325 MG tablet  65 tablet       [employer], [place], [place]

## 2021-01-23 NOTE — Telephone Encounter (Signed)
Done

## 2021-01-25 ENCOUNTER — Other Ambulatory Visit: Payer: Self-pay | Admitting: Family Medicine

## 2021-01-27 ENCOUNTER — Other Ambulatory Visit: Payer: Self-pay

## 2021-01-28 MED ORDER — HYDROCODONE-ACETAMINOPHEN 5-325 MG PO TABS: 1.0000 | ORAL_TABLET | Freq: Four times a day (QID) | ORAL | 0 refills | Status: DC | PRN

## 2021-01-28 NOTE — Addendum Note (Signed)
Addended by: Willette Pa on: 01/28/2021 10:29 AM   Modules accepted: Orders

## 2021-01-31 ENCOUNTER — Other Ambulatory Visit: Payer: Self-pay | Admitting: Family Medicine

## 2021-01-31 ENCOUNTER — Other Ambulatory Visit: Payer: Self-pay

## 2021-01-31 NOTE — Telephone Encounter (Signed)
LOV 05/17/20 Last refill 12/31/20, #60, 0 refills  Please review, thanks!

## 2021-02-12 NOTE — Progress Notes (Signed)
Chronic Care Management Pharmacy Note  02/14/2021 Name:  [name] [name] MRN:  [id number] DOB:  1940-12-19  Subjective: [name] [name] is an 81 y.o. year old male who is a primary patient of [name], Cammie Mcgee, MD.  The CCM team was consulted for assistance with disease management and care coordination needs.    Engaged with patient by telephone for follow up visit in response to provider referral for pharmacy case management and/or care coordination services.   Consent to Services:  The patient was given the following information about Chronic Care Management services today, agreed to services, and gave verbal consent: 1. CCM service includes personalized support from designated clinical staff supervised by the primary care provider, including individualized plan of care and coordination with other care providers 2. 24/7 contact phone numbers for assistance for urgent and routine care needs. 3. Service will only be billed when office clinical staff spend 20 minutes or more in a month to coordinate care. 4. Only one practitioner may furnish and bill the service in a calendar month. 5.The patient may stop CCM services at any time (effective at the end of the month) by phone call to the office staff. 6. The patient will be responsible for cost sharing (co-pay) of up to 20% of the service fee (after annual deductible is met). Patient agreed to services and consent obtained.  Patient Care Team: [name], MD as PCP - General (Family Medicine) Edythe Clarity, [facility] as Pharmacist (Pharmacist) Edythe Clarity, [facility] as Pharmacist (Pharmacist)  Recent office visits: 06/18/20 [name]) -  05/17/20 [name]) - difficulty managing blood sugars.  Recommended he resume Trulicity, referral to endocrinology placed.  Recent consult visits: 06/18/20 [name]) - back pain, no changes to medication.  Hospital visits: None in previous 6 months   Objective:  Lab Results  Component Value Date    CREATININE 0.87 05/17/2020   BUN 13 05/17/2020   GFRNONAA 82 05/17/2020   GFRAA 95 05/17/2020   NA 141 05/17/2020   K 4.3 05/17/2020   CALCIUM 9.6 05/17/2020   CO2 28 05/17/2020   GLUCOSE 117 (H) 05/17/2020    Lab Results  Component Value Date/Time   HGBA1C 12.1 (A) 11/05/2020 02:33 PM   HGBA1C 9.2 (H) 05/17/2020 11:41 AM   HGBA1C 8.7 (H) 07/20/2019 12:41 PM   MICROALBUR 0.6 05/11/2016 09:51 AM   MICROALBUR <0.2 03/15/2015 04:31 PM    Last diabetic Eye exam:  Lab Results  Component Value Date/Time   HMDIABEYEEXA No Retinopathy 11/04/2020 12:00 AM    Last diabetic Foot exam: No results found for: HMDIABFOOTEX   Lab Results  Component Value Date   CHOL 136 05/17/2020   HDL 53 05/17/2020   LDLCALC 65 05/17/2020   LDLDIRECT 109 (H) 04/07/2017   TRIG 99 05/17/2020   CHOLHDL 2.6 05/17/2020    Hepatic Function Latest Ref Rng & Units 05/17/2020 07/20/2019 10/06/2018  Total Protein 6.1 - 8.1 g/dL 6.5 6.8 6.1  Albumin 3.5 - 5.0 g/dL - - -  AST 10 - 35 U/L '19 18 19  ' ALT 9 - 46 U/L '17 16 17  ' Alk Phosphatase 38 - 126 U/L - - -  Total Bilirubin 0.2 - 1.2 mg/dL 0.4 0.4 0.3    Lab Results  Component Value Date/Time   TSH 2.45 11/07/2015 08:43 AM    CBC Latest Ref Rng & Units 05/17/2020 07/20/2019 10/06/2018  WBC 3.8 - 10.8 Thousand/uL 8.7 10.7 12.0(H)  Hemoglobin 13.2 - 17.1 g/dL 12.9(L) 13.9  13.0(L)  Hematocrit 38.5 - 50.0 % 40.4 41.6 38.8  Platelets 140 - 400 Thousand/uL 244 222 222    Lab Results  Component Value Date/Time   VD25OH 58 05/17/2020 11:41 AM    Clinical ASCVD: No  The ASCVD Risk score (Arnett DK, et al., 2019) failed to calculate for the following reasons:   The patient has a prior MI or stroke diagnosis    Depression screen [facility] 2/9 06/28/2020 05/17/2020 08/17/2017  Decreased Interest 0 2 2  Down, Depressed, Hopeless 0 0 0  PHQ - 2 Score 0 2 2  Altered sleeping 0 1 1  Tired, decreased energy 0 1 3  Change in appetite 0 0 0  Feeling bad or failure about  yourself  0 0 0  Trouble concentrating 0 2 0  Moving slowly or fidgety/restless 0 0 1  Suicidal thoughts 0 0 0  PHQ-9 Score 0 6 7  Difficult doing work/chores Not difficult at all Somewhat difficult Very difficult  Some recent data might be hidden     Social History   Tobacco Use  Smoking Status Every Day   Packs/day: 1.00   Years: 60.00   Pack years: 60.00   Types: Cigarettes  Smokeless Tobacco Never   BP Readings from Last 3 Encounters:  11/19/20 (!) 104/56  11/05/20 (!) 82/48  09/17/20 114/62   Pulse Readings from Last 3 Encounters:  11/19/20 88  11/05/20 84  09/17/20 (!) 101   Wt Readings from Last 3 Encounters:  11/19/20 137 lb (62.1 kg)  11/05/20 134 lb (60.8 kg)  09/17/20 152 lb (68.9 kg)   BMI Readings from Last 3 Encounters:  11/19/20 20.23 kg/m  11/05/20 19.79 kg/m  09/17/20 22.45 kg/m    Assessment/Interventions: Review of patient past medical history, allergies, medications, health status, including review of consultants reports, laboratory and other test data, was performed as part of comprehensive evaluation and provision of chronic care management services.   SDOH:  (Social Determinants of Health) assessments and interventions performed: Yes  SDOH Screenings   Alcohol Screen: Low Risk    Last Alcohol Screening Score (AUDIT): 0  Depression (PHQ2-9): Low Risk    PHQ-2 Score: 0  Financial Resource Strain: Low Risk    Difficulty of Paying Living Expenses: Not hard at all  Food Insecurity: No Food Insecurity   Worried About Charity fundraiser in the Last Year: Never true   Ran Out of Food in the Last Year: Never true  Housing: Low Risk    Last Housing Risk Score: 0  Physical Activity: Inactive   Days of Exercise per Week: 0 days   Minutes of Exercise per Session: 0 min  Social Connections: Moderately Integrated   Frequency of Communication with Friends and Family: More than three times a week   Frequency of Social Gatherings with Friends and  Family: Three times a week   Attends Religious Services: More than 4 times per year   Active Member of Clubs or Organizations: No   Attends Archivist Meetings: Never   Marital Status: Married  Stress: No Stress Concern Present   Feeling of Stress : Not at all  Tobacco Use: High Risk   Smoking Tobacco Use: Every Day   Smokeless Tobacco Use: Never   Passive Exposure: Not on file  Transportation Needs: No Transportation Needs   Lack of Transportation (Medical): No   Lack of Transportation (Non-Medical): No    CCM Care Plan  Allergies  Allergen Reactions  Invokana [Canagliflozin] Palpitations, Rash and Other (See Comments)    Rash, tachycardia,weight loss    Plavix [Clopidogrel Bisulfate]     Upset stomach    Medications Reviewed Today     Reviewed by Edythe Clarity, [facility] (Pharmacist) on 02/14/21 at 1003  Med List Status: <None>   Medication Order Taking? Sig Documenting Provider Last Dose Status Informant  ALPRAZolam (XANAX) 0.25 MG tablet [id number] Yes TAKE 1 TABLET BY MOUTH THREE TIMES DAILY AS NEEDED [name], MD Taking Active   B-D ULTRAFINE III SHORT PEN 31G X 8 MM MISC [id number] Yes USE TO INJECT INSULIN EVERY DAY [name], MD Taking Active   Blood Glucose Monitoring Suppl [facility] VERIO) w/Device Drucie Opitz [id number] Yes USE TO TEST BLOOD SUGAR LEVELS THREE TIMES DAILY Orlena Sheldon, PA-C Taking Active Spouse/Significant Other  Cholecalciferol (DIALYVITE VITAMIN D 5000) 125 MCG (5000 UT) capsule [id number] Yes Take 5,000 Units by mouth daily. [provider] Taking Active   donepezil (ARICEPT) 10 MG tablet [id number] Yes TAKE 1 TABLET(10 MG) BY MOUTH AT BEDTIME [name], MD Taking Active   HYDROcodone-acetaminophen (NORCO/VICODIN) 5-325 MG tablet [id number] Yes Take 1 tablet by mouth every 6 (six) hours as needed for moderate pain (Must last 30 days.). Sanjuana Kava, MD Taking Active   insulin glargine (LANTUS SOLOSTAR) 100 UNIT/ML  Solostar Pen [id number] Yes Inject 10 Units into the skin at bedtime. Cassandria Anger, MD Taking Active   Lancets (ONETOUCH DELICA PLUS KZLDJT70V) [place] [id number] Yes USE TO CHECK BLOOD SUGAR TWICE DAILY. Dx: E11.9. [name], MD Taking Active   losartan (COZAAR) 50 MG tablet [id number] Yes Take 50 mg by mouth daily. [provider] Taking Active   memantine (NAMENDA) 10 MG tablet [id number] Yes TAKE 1 TABLET(10 MG) BY MOUTH TWICE DAILY [name], MD Taking Active   metFORMIN (GLUCOPHAGE) 1000 MG tablet [id number] Yes TAKE 1 TABLET(1000 MG) BY MOUTH TWICE DAILY WITH A MEAL [name], MD Taking Active   [facility] VERIO test strip [id number] Yes USE TO TEST BLOOD SUGAR 2 TO 3 TIMES DAILY [name], MD Taking Active   pioglitazone (ACTOS) 30 MG tablet [id number] Yes Take 1 tablet (30 mg total) by mouth daily. Cassandria Anger, MD Taking Active   rosuvastatin (CRESTOR) 40 MG tablet [id number] Yes TAKE 1 TABLET(40 MG) BY MOUTH DAILY [name], MD Taking Active             Patient Active Problem List   Diagnosis Date Noted   Vitamin D deficiency 05/17/2020   Cerebrovascular accident (CVA) ([place]) 05/17/2020   Carotid stenosis 06/15/2016   Carotid disease, bilateral ([place]) 04/12/2015   History of colonic polyps    Iron deficiency anemia 12/21/2013   Smoker 12/21/2013   Anxiety    Diabetes mellitus ([place])    Diabetes mellitus type 2, uncomplicated ([place])    Hyperlipemia    Hypertension    Cellulitis of foot, left 08/09/2010    Immunization History  Administered Date(s) Administered   Influenza, High Dose Seasonal PF 10/22/2016, 01/24/2018   Influenza,inj,Quad PF,6+ Mos 10/22/2014, 11/07/2015   Influenza-Unspecified 09/12/2013   Pneumococcal Conjugate-13 11/07/2015   Pneumococcal Polysaccharide-23 06/26/2014   Tdap 03/09/2016    Conditions to be addressed/monitored:  HTN, HLD, DM  Care Plan : General Pharmacy (Adult)  Updates made by  Edythe Clarity, RPH since 02/14/2021 12:00 AM     Problem: HTN, DM   Priority: High  Onset Date: 02/23/2020  Long-Range Goal: Patient-Specific Goal   Start Date: 02/23/2020  Expected End Date: 08/22/2020  Recent Progress: On track  Priority: High  Note:    Current Barriers:  Severely elevated A1c   Pharmacist Clinical Goal(s):  Over the next 90 days, patient will maintain control of blood glucose and blood pressure as evidenced by A1c, home monitoring  through collaboration with PharmD and provider.    Interventions: 1:1 collaboration with [name], MD regarding development and update of comprehensive plan of care as evidenced by provider attestation and co-signature Inter-disciplinary care team collaboration (see longitudinal plan of care) Comprehensive medication review performed; medication list updated in electronic medical record  Hypertension (BP goal <130/80) -controlled -Current treatment: Losartan 64m Appropriate, Effective, Safe, Accessible -Medications previously tried: none noted  -Current home readings: doesn't check regularly -Current dietary habits: likes to snack, wife describes him as having sweet tooth. -Current exercise habits: love to walk outside when weather is nice -Denies hypotensive/hypertensive symptoms -Educated on BP goals and benefits of medications for prevention of heart attack, stroke and kidney damage; Daily salt intake goal < 2300 mg; Importance of home blood pressure monitoring; -Counseled to monitor BP at home periodically, document, and provide log at future appointments -Recommended to continue current medication  Update 06/26/20 BP has been controlled they are still checking daily. Denies any symptoms Continue current medication  Update 10/03/20 No issues with BP, they continue to monitor No dizziness or HA's Continue current meds for now  Update 02/13/21 BP has been controlled, no logs at home however. Denies  any dizziness or HA. BP on lower end in office recently. IF he does develop any dizziness, etc would recommend dose decrease of losartan to 229mdaily. Will FU after visit to endocrine.  Diabetes (A1c goal <7%) -controlled -Current medications: Lantus 20 units hs Appropriate, Query effective, Pioglitazone 3023maily Appropriate, Query effective, Metformin 1000m67mice daily with a meal Appropriate, Query effective, -Medications previously tried: Trulicity  -Current home glucose readings fasting glucose: 94-150, one reading low 200s when they had a big meal the night before -Denies hypoglycemic/hyperglycemic symptoms -Current meal patterns:  breakfast:  lunch:  dinner:  snacks: low sugar pudding, low sugar fruit cup drinks:  -Current exercise: minimal, loves to walk outside when weather is nice -Educated onA1c and blood sugar goals; Prevention and management of hypoglycemic episodes; Benefits of routine self-monitoring of blood sugar; -Counseled to check feet daily and get yearly eye exams -Recommended to continue current medication Assessed current risk for hypoglycemia  Update 06/26/20 Sugar Average: 7 day -196; 14 days - 197;  90 days - 179 Does report a reading of 48 on 06/24/20 and two days prior sugar was 438. Continues to fluctuate and reports he is eating the same thing daily.  Some days he is more active than others. They still have not gotten in touch with Endocrinologist - will consult with PCP on correcting referral to the [facility] may benefit from starting back on Trulicity with an empiric reduction in long acting insulin.  They have medicaid now so affordability should not be an issue. Will consult with PCP on this and also continue referral to Endocrinology.  Update 10/03/20 Sugars have been ranging from around 180-280 since patient fell and fractured his pelvis.  Wife is having a hard time getting him to agree to take his Lantus which has  led to the elevated sugars.  They also never heard from [place] scheduling a visit.  As long as  sugars do not shoot up into the 300-400 again patient is OK to recover at home.  Encouraged to give Lantus as much as he will allow.  Will follow up on Endo referral. No changes to meds at this time  Update 02/13/21 He is not followed by Endocrine for uncontrolled diabetes. A1c is now > 12.0 but he has not had visit with Endocrine since changing meds around.  Wife reports sugars are lower but still majority of them are > 200.  Patient with dementia and getting him to follow diet his hard because he likes to have cookies etc. Actos recently deceased to 66m daily. He is now more compliant with insulin.  Patient may benefit from SGLT-2 inhibitor or GLP-1 if A1c remains uncontrolled on current regimen. Has upcoming visit with Endocrine, will fu on A1c and make recommendations if needed after this visit.  Patient Goals/Self-Care Activities Over the next 90 days, patient will:  - take medications as prescribed check glucose daily, document, and provide at future appointments check blood pressure periodically, document, and provide at future appointments  Follow Up Plan: The care management team will reach out to the patient again over the next 90 days.            Medication Assistance: None required.  Patient affirms current coverage meets needs.  Compliance/Adherence/Medication fill history: Care Gaps: Needs foot exam   [id number] [place] [facility] [name]: [phone]: [phone]  Uses pill box? Yes Pt endorses 100% compliance  We discussed: Benefits of medication synchronization, packaging and delivery as well as enhanced pharmacist oversight with [employer]. Patient decided to: Continue current medication management  strategy  Care Plan and Follow Up Patient Decision:  Patient agrees to Care Plan and Follow-up.  Plan: The care management team will reach out to the patient again over the next 90 days.  CBeverly Milch PharmD Clinical Pharmacist [facility]([phone]

## 2021-02-13 ENCOUNTER — Ambulatory Visit (INDEPENDENT_AMBULATORY_CARE_PROVIDER_SITE_OTHER): Payer: PPO | Admitting: Orthopaedic Surgery

## 2021-02-13 ENCOUNTER — Encounter: Payer: Self-pay | Admitting: Orthopaedic Surgery

## 2021-02-13 ENCOUNTER — Other Ambulatory Visit: Payer: Self-pay

## 2021-02-13 ENCOUNTER — Ambulatory Visit (INDEPENDENT_AMBULATORY_CARE_PROVIDER_SITE_OTHER): Payer: PPO | Admitting: Pharmacist

## 2021-02-13 DIAGNOSIS — S32591D Other specified fracture of right pubis, subsequent encounter for fracture with routine healing: Secondary | ICD-10-CM | POA: Diagnosis not present

## 2021-02-13 DIAGNOSIS — I10 Essential (primary) hypertension: Secondary | ICD-10-CM

## 2021-02-13 DIAGNOSIS — E1165 Type 2 diabetes mellitus with hyperglycemia: Secondary | ICD-10-CM

## 2021-02-13 NOTE — Progress Notes (Signed)
He is chronically confused.  His wife is with him and is historian.  He has no further pain with the pelvis fracture.  He is walking well.  He has chronic lower back pain and that is not hurting today.  NV intact.  Encounter Diagnosis  Name Primary?   Closed fracture of single ramus of right pubis with routine healing, subsequent encounter Yes   I will see in three months for the lower back problem.  Call if any problem.  Precautions discussed.  Electronically Signed Sanjuana Kava, MD 2/2/202310:16 AM

## 2021-02-14 NOTE — Patient Instructions (Addendum)
Visit Information   Goals Addressed             This Visit's Progress    Monitor and Manage My Blood Sugar-Diabetes Type 2   Not on track    Timeframe:  Long-Range Goal Priority:  High Start Date:    02/23/20                         Expected End Date:   02/23/20                    Follow Up Date 08/10/20   - check blood sugar at prescribed times - enter blood sugar readings and medication or insulin into daily log - take the blood sugar log to all doctor visits    Why is this important?   Checking your blood sugar at home helps to keep it from getting very high or very low.  Writing the results in a diary or log helps the doctor know how to care for you.  Your blood sugar log should have the time, date and the results.  Also, write down the amount of insulin or other medicine that you take.  Other information, like what you ate, exercise done and how you were feeling, will also be helpful.     Notes: Contact providers with any hypoglycemia < 70  06/26/20 - BG still fluctuating, setting up with Endo, consider Trulicity       Patient Care Plan: General Pharmacy (Adult)     Problem Identified: HTN, DM   Priority: High  Onset Date: 02/23/2020     Long-Range Goal: Patient-Specific Goal   Start Date: 02/23/2020  Expected End Date: 08/22/2020  Recent Progress: On track  Priority: High  Note:    Current Barriers:  Severely elevated A1c   Pharmacist Clinical Goal(s):  Over the next 90 days, patient will maintain control of blood glucose and blood pressure as evidenced by A1c, home monitoring  through collaboration with PharmD and provider.    Interventions: 1:1 collaboration with [name], MD regarding development and update of comprehensive plan of care as evidenced by provider attestation and co-signature Inter-disciplinary care team collaboration (see longitudinal plan of care) Comprehensive medication review performed; medication list updated in  electronic medical record  Hypertension (BP goal <130/80) -controlled -Current treatment: Losartan 50mg  Appropriate, Effective, Safe, Accessible -Medications previously tried: none noted  -Current home readings: doesn't check regularly -Current dietary habits: likes to snack, wife describes him as having sweet tooth. -Current exercise habits: love to walk outside when weather is nice -Denies hypotensive/hypertensive symptoms -Educated on BP goals and benefits of medications for prevention of heart attack, stroke and kidney damage; Daily salt intake goal < 2300 mg; Importance of home blood pressure monitoring; -Counseled to monitor BP at home periodically, document, and provide log at future appointments -Recommended to continue current medication  Update 06/26/20 BP has been controlled they are still checking daily. Denies any symptoms Continue current medication  Update 10/03/20 No issues with BP, they continue to monitor No dizziness or HA's Continue current meds for now  Update 02/13/21 BP has been controlled, no logs at home however. Denies any dizziness or HA. BP on lower end in office recently. IF he does develop any dizziness, etc would recommend dose decrease of losartan to 25mg  daily. Will FU after visit to endocrine.  Diabetes (A1c goal <7%) -controlled -Current medications: Lantus 20 units hs Appropriate, Query effective, Pioglitazone 30mg   daily Appropriate, Query effective, Metformin 1000mg  twice daily with a meal Appropriate, Query effective, -Medications previously tried: Trulicity  -Current home glucose readings fasting glucose: 94-150, one reading low 200s when they had a big meal the night before -Denies hypoglycemic/hyperglycemic symptoms -Current meal patterns:  breakfast:  lunch:  dinner:  snacks: low sugar pudding, low sugar fruit cup drinks:  -Current exercise: minimal, loves to walk outside when weather is nice -Educated onA1c and blood sugar  goals; Prevention and management of hypoglycemic episodes; Benefits of routine self-monitoring of blood sugar; -Counseled to check feet daily and get yearly eye exams -Recommended to continue current medication Assessed current risk for hypoglycemia  Update 06/26/20 Sugar Average: 7 day -196; 14 days - 197;  90 days - 179 Does report a reading of 48 on 06/24/20 and two days prior sugar was 438. Continues to fluctuate and reports he is eating the same thing daily.  Some days he is more active than others. They still have not gotten in touch with Endocrinologist - will consult with PCP on correcting referral to the [place]. Patient may benefit from starting back on Trulicity with an empiric reduction in long acting insulin.  They have medicaid now so affordability should not be an issue. Will consult with PCP on this and also continue referral to Endocrinology.  Update 10/03/20 Sugars have been ranging from around 180-280 since patient fell and fractured his pelvis.  Wife is having a hard time getting him to agree to take his Lantus which has led to the elevated sugars.  They also never heard from [facility] about scheduling a visit.  As long as sugars do not shoot up into the 300-400 again patient is OK to recover at home.  Encouraged to give Lantus as much as he will allow.  Will follow up on Endo referral. No changes to meds at this time  Update 02/13/21 He is not followed by Endocrine for uncontrolled diabetes. A1c is now > 12.0 but he has not had visit with Endocrine since changing meds around.  Wife reports sugars are lower but still majority of them are > 200.  Patient with dementia and getting him to follow diet his hard because he likes to have cookies etc. Actos recently deceased to 30mg  daily. He is now more compliant with insulin.  Patient may benefit from SGLT-2 inhibitor or GLP-1 if A1c remains uncontrolled on current regimen. Has upcoming visit with  Endocrine, will fu on A1c and make recommendations if needed after this visit.  Patient Goals/Self-Care Activities Over the next 90 days, patient will:  - take medications as prescribed check glucose daily, document, and provide at future appointments check blood pressure periodically, document, and provide at future appointments  Follow Up Plan: The care management team will reach out to the patient again over the next 90 days.           Patient verbalizes understanding of instructions and care plan provided today and agrees to view in [place]. Active MyChart status confirmed with patient.   Telephone follow up appointment with pharmacy team member scheduled for: 3 months  Edythe Clarity, [place], PharmD, [place] Clinical Pharmacist Practitioner [place] [phone]

## 2021-02-19 ENCOUNTER — Ambulatory Visit: Payer: PPO | Admitting: "Endocrinology

## 2021-03-03 ENCOUNTER — Other Ambulatory Visit: Payer: Self-pay | Admitting: Family Medicine

## 2021-03-05 NOTE — Telephone Encounter (Signed)
LOV 05/17/20 Last refill 01/31/21, #60, 0 refills  Please review, thanks!

## 2021-03-11 DIAGNOSIS — I10 Essential (primary) hypertension: Secondary | ICD-10-CM

## 2021-03-11 DIAGNOSIS — E1165 Type 2 diabetes mellitus with hyperglycemia: Secondary | ICD-10-CM

## 2021-03-12 ENCOUNTER — Other Ambulatory Visit: Payer: Self-pay | Admitting: Family Medicine

## 2021-03-13 ENCOUNTER — Telehealth: Payer: Self-pay | Admitting: Orthopaedic Surgery

## 2021-03-13 ENCOUNTER — Other Ambulatory Visit: Payer: Self-pay | Admitting: Orthopedic Surgery

## 2021-03-13 MED ORDER — HYDROCODONE-ACETAMINOPHEN 5-325 MG PO TABS: 1.0000 | ORAL_TABLET | Freq: Four times a day (QID) | ORAL | 0 refills | Status: AC | PRN

## 2021-03-13 NOTE — Telephone Encounter (Signed)
Patient's wife called in to request her husband pain medicine needs to be refilled.  ? ?HYDROcodone-acetaminophen (NORCO/VICODIN) 5-325 MG tablet ? ?Pharmacy: [facility] on [facility].  ? ?I advised  her Dr. [name] is not in the office today.  She said it is Thursday, he is in the office on Tuesday and Thursday.  I said yes ma'am he will be back next Tuesday. ? ?She is demanding that the script gets filled the same way  Dr. [name] has it.  Last time it was only filled for 7 days worth when he was out of the office and the way it was written it was to last him for 30 days, and he couldn't get any more of it for 30 days!   ? ?I apologized for the inconvenience, advised her  it is up to the doctors discretion on how the prescription is wrote.  ? ?She was NOT happy and will take this up with Dr. [name].    ? ? ?

## 2021-03-13 NOTE — Progress Notes (Signed)
Meds ordered this encounter  ?Medications  ? HYDROcodone-acetaminophen (NORCO/VICODIN) 5-325 MG tablet  ?  Sig: Take 1 tablet by mouth every 6 (six) hours as needed for moderate pain (Must last 30 days.).  ?  Dispense:  65 tablet  ?  Refill:  0  ? ? ?

## 2021-03-26 ENCOUNTER — Telehealth: Payer: Self-pay | Admitting: Pharmacist

## 2021-03-26 NOTE — Progress Notes (Signed)
? ? ?Chronic Care Management ?Pharmacy Assistant  ? ?Name: [name] [name]  MRN: [id number] DOB: 1940-11-28 ? ? ?Reason for Encounter: Disease State - Diabetes Call  ?  ? ?Recent office visits:  ?None noted.  ? ?Recent consult visits:  ?None noted. ? ?Hospital visits:  ?None in previous 6 months ? ?Medications: ?Outpatient Encounter Medications as of 03/26/2021  ?Medication Sig  ? ALPRAZolam (XANAX) 0.25 MG tablet TAKE 1 TABLET BY MOUTH THREE TIMES DAILY AS NEEDED  ? B-D ULTRAFINE III SHORT PEN 31G X 8 MM MISC USE TO INJECT INSULIN EVERY DAY  ? Blood Glucose Monitoring Suppl (ONETOUCH VERIO) w/Device KIT USE TO TEST BLOOD SUGAR LEVELS THREE TIMES DAILY  ? Cholecalciferol (DIALYVITE VITAMIN D 5000) 125 MCG (5000 UT) capsule Take 5,000 Units by mouth daily.  ? donepezil (ARICEPT) 10 MG tablet TAKE 1 TABLET(10 MG) BY MOUTH AT BEDTIME  ? HYDROcodone-acetaminophen (NORCO/VICODIN) 5-325 MG tablet Take 1 tablet by mouth every 6 (six) hours as needed for moderate pain (Must last 30 days.).  ? insulin glargine (LANTUS SOLOSTAR) 100 UNIT/ML Solostar Pen ADMINISTER 15 UNITS UNDER THE SKIN DAILY  ? Lancets (ONETOUCH DELICA PLUS [id number]) MISC USE TO CHECK BLOOD SUGAR TWICE DAILY. Dx: E11.9.  ? losartan (COZAAR) 50 MG tablet Take 50 mg by mouth daily.  ? memantine (NAMENDA) 10 MG tablet TAKE 1 TABLET(10 MG) BY MOUTH TWICE DAILY  ? metFORMIN (GLUCOPHAGE) 1000 MG tablet TAKE 1 TABLET(1000 MG) BY MOUTH TWICE DAILY WITH A MEAL  ? ONETOUCH VERIO test strip USE TO TEST BLOOD SUGAR 2 TO 3 TIMES DAILY  ? pioglitazone (ACTOS) 30 MG tablet Take 1 tablet (30 mg total) by mouth daily.  ? rosuvastatin (CRESTOR) 40 MG tablet TAKE 1 TABLET(40 MG) BY MOUTH DAILY  ? ?No facility-administered encounter medications on file as of 03/26/2021.  ? ? ?Current antihyperglycemic regimen:  ?Lantus 100u/ml 20 mg at bedtime ?Pioglitazone 30 mg ?Metformin 1036m twice daily with a meal ? ? ?What recent interventions/DTPs have been made to improve glycemic  control:  ?Patient's wife reported patient was decreased to 20 mg Lantus. ? ?Have there been any recent hospitalizations or ED visits since last visit with CPP?  ? ? ?Patient denies hypoglycemic symptoms, including Pale, Sweaty, Shaky, Hungry, Nervous/irritable, and Vision changes ? ? ?Patient denies hyperglycemic symptoms, including blurry vision, excessive thirst, fatigue, polyuria, and weakness ? ? ?How often are you checking your blood sugar? ?Patients wife reports he is checking his blood sugar twice daily. ?  ?What are your blood sugars ranging?  ?Fasting: 190-245 ?Before meals:  ?After meals:  ?Bedtime:  ? ?During the week, how often does your blood glucose drop below 70? ? Patients wife reported patient has had some readings in the 70s but it is resolved with food or drink.  ? ?Are you checking your feet daily/regularly? ?Patient's wife reports she does check his feet regularly. She denied any current concerns.  ?  ?Adherence Review: ?Is the patient currently on a STATIN medication? Yes ?Is the patient currently on ACE/ARB medication? Yes ?Does the patient have >5 day gap between last estimated fill dates? No ? ? ?Care Gaps ?  ?AWV: done 06/28/20 ?Colonoscopy: done 07/30/14 ?DM Eye Exam: due 11/04/21 ?DM Foot Exam: due 10/22/17 ?Microalbumin: done 05/17/20 ?HbgAIC: done 11/05/20 (9.2) ?DEXA: N/A ?Mammogram: N/A ? ?  ?Star Rating Drugs: ?rosuvastatin (CRESTOR) 40 MG tablet - last filled 10/21/20 90 days  ?metFORMIN (GLUCOPHAGE) 1000 MG tablet - last filled 11/21/20 90 days  ? ? ?  Future Appointments  ?Date Time Provider [place]  ?05/13/2021 10:20 AM Sanjuana Kava, MD OCR-OCR None  ?05/15/2021  3:00 PM [facility] PHARMACIST [facility]  ? ? ? ?Liza Showfety, CCMA ?Clinical Pharmacist Assistant  ?([phone] ? ?

## 2021-03-31 ENCOUNTER — Other Ambulatory Visit: Payer: Self-pay | Admitting: Family Medicine

## 2021-04-01 ENCOUNTER — Other Ambulatory Visit: Payer: Self-pay | Admitting: Family Medicine

## 2021-04-01 ENCOUNTER — Telehealth: Payer: Self-pay | Admitting: Family Medicine

## 2021-04-01 MED ORDER — ALPRAZOLAM 0.25 MG PO TABS: ORAL_TABLET | ORAL | 1 refills | Status: DC

## 2021-04-01 NOTE — Telephone Encounter (Signed)
Patient's spouse called to follow up on refill request sent by pharmacy; pharmacist instructed patient to call to request refill of ALPRAZolam (XANAX) 0.25 MG tablet ? ? ?Pharmacy confirmed as  ? ?[id number] - , [address]  ?[place], [place] [address]  ?Phone:  [phone]  Fax:  [phone]  ?DEA #:  [id number] ? ?Please advise at [phone]. ?

## 2021-04-01 NOTE — Addendum Note (Signed)
Addended by: Colman Cater on: 04/01/2021 11:28 AM ? ? Modules accepted: Orders ? ?

## 2021-04-01 NOTE — Telephone Encounter (Signed)
LOV 02/13/21 ?Last refill 02/13/21, #60, 0 refills ? ?Please review, thanks! ? ?

## 2021-04-04 NOTE — Telephone Encounter (Signed)
Meds sent to pharmacy 04/01/21 ?

## 2021-04-14 ENCOUNTER — Telehealth: Payer: Self-pay | Admitting: Orthopaedic Surgery

## 2021-04-14 MED ORDER — HYDROCODONE-ACETAMINOPHEN 5-325 MG PO TABS: 1.0000 | ORAL_TABLET | Freq: Four times a day (QID) | ORAL | 0 refills | Status: DC | PRN

## 2021-04-14 NOTE — Telephone Encounter (Signed)
Patient/designated contact [name], wife, called for refill: ?HYDROcodone-acetaminophen (NORCO/VICODIN) 5-325 MG tablet 65 tablet  ?     [facility] ?

## 2021-04-24 ENCOUNTER — Encounter: Payer: PPO | Admitting: Family Medicine

## 2021-04-28 ENCOUNTER — Encounter: Payer: PPO | Admitting: Family Medicine

## 2021-04-29 ENCOUNTER — Telehealth: Payer: Self-pay | Admitting: Pharmacist

## 2021-04-29 NOTE — Progress Notes (Signed)
? ? ?Chronic Care Management ?Pharmacy Assistant  ? ?Name: [name] [name]  MRN: [id number] DOB: [birth year] ? ? ?Reason for Encounter: Disease State - Diabetes Call  ?  ? ?Recent office visits:  ?None noted.  ? ?Recent consult visits:  ?None noted.  ? ?Hospital visits:  ?None in previous 6 months ? ?Medications: ?Outpatient Encounter Medications as of 04/29/2021  ?Medication Sig  ? ALPRAZolam (XANAX) 0.25 MG tablet TAKE 1 TABLET BY MOUTH THREE TIMES DAILY AS NEEDED  ? B-D ULTRAFINE III SHORT PEN 31G X 8 MM MISC USE TO INJECT INSULIN EVERY DAY  ? Blood Glucose Monitoring Suppl (ONETOUCH VERIO) w/Device KIT USE TO TEST BLOOD SUGAR LEVELS THREE TIMES DAILY  ? Cholecalciferol (DIALYVITE VITAMIN D 5000) 125 MCG (5000 UT) capsule Take 5,000 Units by mouth daily.  ? donepezil (ARICEPT) 10 MG tablet TAKE 1 TABLET(10 MG) BY MOUTH AT BEDTIME  ? HYDROcodone-acetaminophen (NORCO/VICODIN) 5-325 MG tablet Take 1 tablet by mouth every 6 (six) hours as needed for moderate pain (Must last 30 days.).  ? insulin glargine (LANTUS SOLOSTAR) 100 UNIT/ML Solostar Pen ADMINISTER 15 UNITS UNDER THE SKIN DAILY  ? Lancets (ONETOUCH DELICA PLUS [id number]) MISC USE TO CHECK BLOOD SUGAR TWICE DAILY. Dx: E11.9.  ? losartan (COZAAR) 50 MG tablet Take 50 mg by mouth daily.  ? memantine (NAMENDA) 10 MG tablet TAKE 1 TABLET(10 MG) BY MOUTH TWICE DAILY  ? metFORMIN (GLUCOPHAGE) 1000 MG tablet TAKE 1 TABLET(1000 MG) BY MOUTH TWICE DAILY WITH A MEAL  ? ONETOUCH VERIO test strip USE TO TEST BLOOD SUGAR 2 TO 3 TIMES DAILY  ? pioglitazone (ACTOS) 30 MG tablet Take 1 tablet (30 mg total) by mouth daily.  ? rosuvastatin (CRESTOR) 40 MG tablet TAKE 1 TABLET(40 MG) BY MOUTH DAILY  ? ?No facility-administered encounter medications on file as of 04/29/2021.  ? ? ?Current antihyperglycemic regimen:  ?Lantus 100u/ml 20 mg at bedtime ?Pioglitazone 45 mg 1 tablet daily (reported per wife) ?Metformin 1040m twice daily with a meal ?  ?  ?What recent interventions/DTPs  have been made to improve glycemic control:  ?Patient's wife reported patient was decreased to 20 mg Lantus.Wife states she put patient back increased dose of Actos (476m as she did not feel the 30 mg was as effective. She reported this was started about 2 weeks ago. She is requesting this strength be refilled by PCP.  ?  ?Have there been any recent hospitalizations or ED visits since last visit with CPP?  ? Patient has not had any ED visits or hospitalizations since last visit with CPP.  ?  ?Patient denies hypoglycemic symptoms, including Pale, Sweaty, Shaky, Hungry, Nervous/irritable, and Vision changes ?  ?  ?Patient denies hyperglycemic symptoms, including blurry vision, excessive thirst, fatigue, polyuria, and weakness ?  ?  ?How often are you checking your blood sugar? ?Patients wife reports he is checking his blood sugar twice daily. ?             ?What are your blood sugars ranging?  ?Fasting: 200s  ?Before meals:  ?After meals:  ?Bedtime: 300 ?  ?During the week, how often does your blood glucose drop below 70? ? Patients wife reported patient has had some readings in the 70s but it is resolved with food or drink.  ?  ?Are you checking your feet daily/regularly? ?Patient's wife reports she does check his feet regularly. She denied any current concerns.  ?             ?  Adherence Review: ?Is the patient currently on a STATIN medication? Yes ?Is the patient currently on ACE/ARB medication? Yes ?Does the patient have >5 day gap between last estimated fill dates? No ?  ?  ?Care Gaps ?  ?AWV: done 06/28/20 ?Colonoscopy: done 07/30/14 ?DM Eye Exam: due 11/04/21 ?DM Foot Exam: due 10/22/17 ?Microalbumin: done 05/17/20 ?HbgAIC: done 11/05/20 (9.2) ?DEXA: N/A ?Mammogram: N/A ?  ?  ?Star Rating Drugs: ?Rosuvastatin (CRESTOR) 40 MG tablet - last filled 01/21/2021 90 days  ?MetFORMIN (GLUCOPHAGE) 1000 MG tablet - last filled 03/03/21 90 days  ? ? ?Future Appointments  ?Date Time Provider [place]  ?05/13/2021 10:20 AM  Sanjuana Kava, MD OCR-OCR None  ?05/15/2021  3:00 PM [facility] PHARMACIST [facility]  ?06/27/2021 10:15 AM [facility]  ?06/30/2021  3:00 PM [name], Cammie Mcgee, MD [facility]  ? ? ? ?Liza Showfety, CCMA ?Clinical Pharmacist Assistant  ?[phone] ? ?

## 2021-05-12 NOTE — Progress Notes (Signed)
? ?Chronic Care Management ?Pharmacy Note ? ?05/15/2021 ?Name:  [name] [name] MRN:  [id number] DOB:  26-Jun-1940 ? ?Summary: ?They no longer want to see Endocrine. ?Wife reports glucose is still running > 200 for the most part.  Denies any hypoglycemia.  Still having difficulty getting him to take all of his doses of Lantus due to fighting her to give it.  I do think he would benefit from resuming Trulicity now that they have medicaid and cost will not be an issue. ?Depending on what A1c is we would need to reduce his Lantus.   ?Consider starting Trulicity to replace Actos. ?This would help with compliance with once weekly dosing. ? ?Subjective: ?[name] [name] is an 81 y.o. year old male who is a primary patient of [name], Cammie Mcgee, MD.  The CCM team was consulted for assistance with disease management and care coordination needs.   ? ?Engaged with patient by telephone for follow up visit in response to provider referral for pharmacy case management and/or care coordination services.  ? ?Consent to Services:  ?The patient was given the following information about Chronic Care Management services today, agreed to services, and gave verbal consent: 1. CCM service includes personalized support from designated clinical staff supervised by the primary care provider, including individualized plan of care and coordination with other care providers 2. 24/7 contact phone numbers for assistance for urgent and routine care needs. 3. Service will only be billed when office clinical staff spend 20 minutes or more in a month to coordinate care. 4. Only one practitioner may furnish and bill the service in a calendar month. 5.The patient may stop CCM services at any time (effective at the end of the month) by phone call to the office staff. 6. The patient will be responsible for cost sharing (co-pay) of up to 20% of the service fee (after annual deductible is met). Patient agreed to services and consent obtained. ? ?Patient Care  Team: ?[name], MD as PCP - General (Family Medicine) ?Edythe Clarity, [facility] as Pharmacist (Pharmacist) ?Edythe Clarity, [facility] as Pharmacist (Pharmacist) ? ?Recent office visits:  ?None noted.  ?  ?Recent consult visits:  ?None noted.  ?  ?Hospital visits:  ?None in previous 6 months ? ?Objective: ? ?Lab Results  ?Component Value Date  ? CREATININE 0.87 05/17/2020  ? BUN 13 05/17/2020  ? GFRNONAA 82 05/17/2020  ? GFRAA 95 05/17/2020  ? NA 141 05/17/2020  ? K 4.3 05/17/2020  ? CALCIUM 9.6 05/17/2020  ? CO2 28 05/17/2020  ? GLUCOSE 117 (H) 05/17/2020  ? ? ?Lab Results  ?Component Value Date/Time  ? HGBA1C 12.1 (A) 11/05/2020 02:33 PM  ? HGBA1C 9.2 (H) 05/17/2020 11:41 AM  ? HGBA1C 8.7 (H) 07/20/2019 12:41 PM  ? MICROALBUR 0.6 05/11/2016 09:51 AM  ? MICROALBUR <0.2 03/15/2015 04:31 PM  ?  ?Last diabetic Eye exam:  ?Lab Results  ?Component Value Date/Time  ? HMDIABEYEEXA No Retinopathy 11/04/2020 12:00 AM  ?  ?Last diabetic Foot exam: No results found for: HMDIABFOOTEX  ? ?Lab Results  ?Component Value Date  ? CHOL 136 05/17/2020  ? HDL 53 05/17/2020  ? [place] 65 05/17/2020  ? LDLDIRECT 109 (H) 04/07/2017  ? TRIG 99 05/17/2020  ? CHOLHDL 2.6 05/17/2020  ? ? ? ?  Latest Ref Rng & Units 05/17/2020  ? 11:41 AM 07/20/2019  ? 12:41 PM 10/06/2018  ?  2:09 PM  ?Hepatic Function  ?Total Protein 6.1 - 8.1 g/dL  6.5   6.8   6.1    ?AST 10 - 35 U/L _0 ?ALT 9 - 46 U/L _1 ?Total Bilirubin 0.2 - 1.2 mg/dL 0.4   0.4   0.3    ? ? ?Lab Results  ?Component Value Date/Time  ? TSH 2.45 11/07/2015 08:43 AM  ? ? ? ?  Latest Ref Rng & Units 05/17/2020  ? 11:41 AM 07/20/2019  ? 12:41 PM 10/06/2018  ?  2:09 PM  ?CBC  ?WBC 3.8 - 10.8 Thousand/uL 8.7   10.7   12.0    ?Hemoglobin 13.2 - 17.1 g/dL 12.9   13.9   13.0    ?Hematocrit 38.5 - 50.0 % 40.4   41.6   38.8    ?Platelets 140 - 400 Thousand/uL 244   222   222    ? ? ?Lab Results  ?Component Value Date/Time  ? VD25OH 58 05/17/2020 11:41 AM  ? ? ?Clinical ASCVD: No   ?The ASCVD Risk score (Arnett DK, et al., 2019) failed to calculate for the following reasons: ?  The 2019 ASCVD risk score is only valid for ages 63 to 60 ?  The patient has a prior MI or stroke diagnosis   ? ? ?  06/28/2020  ?  2:16 PM 05/17/2020  ? 11:08 AM 08/17/2017  ?  4:53 PM  ?Depression screen PHQ 2/9  ?Decreased Interest 0 2 2  ?Down, Depressed, Hopeless 0 0 0  ?PHQ - 2 Score 0 2 2  ?Altered sleeping 0 1 1  ?Tired, decreased energy 0 1 3  ?Change in appetite 0 0 0  ?Feeling bad or failure about yourself  0 0 0  ?Trouble concentrating 0 2 0  ?Moving slowly or fidgety/restless 0 0 1  ?Suicidal thoughts 0 0 0  ?PHQ-9 Score 0 6 7  ?Difficult doing work/chores Not difficult at all Somewhat difficult Very difficult  ?  ? ?Social History  ? ?Tobacco Use  ?Smoking Status Every Day  ? Packs/day: 1.00  ? Years: 60.00  ? Pack years: 60.00  ? Types: Cigarettes  ?Smokeless Tobacco Never  ? ?BP Readings from Last 3 Encounters:  ?11/19/20 (!) 104/56  ?11/05/20 (!) 82/48  ?09/17/20 114/62  ? ?Pulse Readings from Last 3 Encounters:  ?11/19/20 88  ?11/05/20 84  ?09/17/20 (!) 101  ? ?Wt Readings from Last 3 Encounters:  ?11/19/20 137 lb (62.1 kg)  ?11/05/20 134 lb (60.8 kg)  ?09/17/20 152 lb (68.9 kg)  ? ?BMI Readings from Last 3 Encounters:  ?11/19/20 20.23 kg/m?  ?11/05/20 19.79 kg/m?  ?09/17/20 22.45 kg/m?  ? ? ?Assessment/Interventions: Review of patient past medical history, allergies, medications, health status, including review of consultants reports, laboratory and other test data, was performed as part of comprehensive evaluation and provision of chronic care management services.  ? ?SDOH:  (Social Determinants of Health) assessments and interventions performed: Yes ? ?SDOH Screenings  ? ?Alcohol Screen: Low Risk   ? Last Alcohol Screening Score (AUDIT): 0  ?Depression (PHQ2-9): Low Risk   ? PHQ-2 Score: 0  ?Financial Resource Strain: Low Risk   ? Difficulty of Paying Living Expenses: Not hard at all  ?Food Insecurity:  No Food Insecurity  ? Worried About Charity fundraiser in the Last Year: Never true  ? Ran Out of Food in the Last Year: Never true  ?Housing: Low Risk   ?  Last Housing Risk Score: 0  ?Physical Activity: Inactive  ? Days of Exercise per Week: 0 days  ? Minutes of Exercise per Session: 0 min  ?Social Connections: Moderately Integrated  ? Frequency of Communication with Friends and Family: More than three times a week  ? Frequency of Social Gatherings with Friends and Family: Three times a week  ? Attends Religious Services: More than 4 times per year  ? Active Member of Clubs or Organizations: No  ? Attends Archivist Meetings: Never  ? Marital Status: Married  ?Stress: No Stress Concern Present  ? Feeling of Stress : Not at all  ?Tobacco Use: High Risk  ? Smoking Tobacco Use: Every Day  ? Smokeless Tobacco Use: Never  ? Passive Exposure: Not on file  ?Transportation Needs: No Transportation Needs  ? Lack of Transportation (Medical): No  ? Lack of Transportation (Non-Medical): No  ? ? ?CCM Care Plan ? ?Allergies  ?Allergen Reactions  ? Invokana [Canagliflozin] Palpitations, Rash and Other (See Comments)  ?  Rash, tachycardia,weight loss ?  ? Plavix [Clopidogrel Bisulfate]   ?  Upset stomach  ? ? ?Medications Reviewed Today   ? ? Reviewed by Edythe Clarity, RPH (Pharmacist) on 05/15/21 at 1520  Med List Status: <None>  ? ?Medication Order Taking? Sig Documenting Provider Last Dose Status Informant  ?ALPRAZolam (XANAX) 0.25 MG tablet [id number] Yes TAKE 1 TABLET BY MOUTH THREE TIMES DAILY AS NEEDED [name], MD Taking Active   ?B-D ULTRAFINE III SHORT PEN 31G X 8 MM MISC [id number] Yes USE TO INJECT INSULIN EVERY DAY [name], MD Taking Active   ?Blood Glucose Monitoring Suppl [facility] VERIO) w/Device Drucie Opitz [id number] Yes USE TO TEST BLOOD SUGAR LEVELS THREE TIMES DAILY Orlena Sheldon, PA-C Taking Active Spouse/Significant Other  ?Cholecalciferol (DIALYVITE VITAMIN D 5000) 125 MCG (5000 UT)  capsule [id number] Yes Take 5,000 Units by mouth daily. [provider] Taking Active   ?donepezil (ARICEPT) 10 MG tablet [id number] Yes TAKE 1 TABLET(10 MG) BY MOUTH AT BEDTIME [name], M

## 2021-05-13 ENCOUNTER — Encounter: Payer: Self-pay | Admitting: Orthopaedic Surgery

## 2021-05-13 ENCOUNTER — Ambulatory Visit (INDEPENDENT_AMBULATORY_CARE_PROVIDER_SITE_OTHER): Payer: PPO | Admitting: Orthopaedic Surgery

## 2021-05-13 DIAGNOSIS — F0280 Dementia in other diseases classified elsewhere without behavioral disturbance: Secondary | ICD-10-CM | POA: Diagnosis not present

## 2021-05-13 DIAGNOSIS — G8929 Other chronic pain: Secondary | ICD-10-CM

## 2021-05-13 DIAGNOSIS — G309 Alzheimer's disease, unspecified: Secondary | ICD-10-CM | POA: Diagnosis not present

## 2021-05-13 DIAGNOSIS — F17210 Nicotine dependence, cigarettes, uncomplicated: Secondary | ICD-10-CM | POA: Diagnosis not present

## 2021-05-13 DIAGNOSIS — M5441 Lumbago with sciatica, right side: Secondary | ICD-10-CM

## 2021-05-13 MED ORDER — HYDROCODONE-ACETAMINOPHEN 5-325 MG PO TABS: 1.0000 | ORAL_TABLET | Freq: Four times a day (QID) | ORAL | 0 refills | Status: DC | PRN

## 2021-05-13 NOTE — Progress Notes (Signed)
He is doing better. ? ?He has alzheimer's disease and is easily disoriented.  His wife gives history.  His back is better, less pain, no injury.  He uses his cane some days, other days he forgets.  He has no weakness. He is taking his medicine. ? ?Spine/Pelvis examination: ? Inspection:  Overall, sacoiliac joint benign and hips nontender; without crepitus or defects. ? ? Thoracic spine inspection: Alignment normal without kyphosis present ? ? Lumbar spine inspection:  Alignment  with normal lumbar lordosis, without scoliosis apparent. ? ? Thoracic spine palpation:  without tenderness of spinal processes ? ? Lumbar spine palpation: without tenderness of lumbar area; without tightness of lumbar muscles  ? ? Range of Motion: ?  Lumbar flexion, forward flexion is normal without pain or tenderness  ?  Lumbar extension is full without pain or tenderness ?  Left lateral bend is normal without pain or tenderness ?  Right lateral bend is normal without pain or tenderness ?  Straight leg raising is normal ? Strength & tone: normal ? ? Stability overall normal stability ? ?Encounter Diagnoses  ?Name Primary?  ? Chronic right-sided low back pain with right-sided sciatica Yes  ? Cigarette nicotine dependence without complication   ? Alzheimer disease ([place])   ? ?I have reviewed the [place] web site prior to prescribing narcotic medicine for this patient. ? ?Call if any problem. ? ?Precautions discussed. ? ?Return in three months. ? ?Electronically Signed ?Sanjuana Kava, MD ?5/2/202310:09 AM ? ?

## 2021-05-15 ENCOUNTER — Ambulatory Visit (INDEPENDENT_AMBULATORY_CARE_PROVIDER_SITE_OTHER): Payer: PPO

## 2021-05-15 DIAGNOSIS — E1165 Type 2 diabetes mellitus with hyperglycemia: Secondary | ICD-10-CM

## 2021-05-15 DIAGNOSIS — I10 Essential (primary) hypertension: Secondary | ICD-10-CM

## 2021-05-15 NOTE — Patient Instructions (Addendum)
Visit Information ? ? Goals Addressed   ? ?  ?  ?  ?  ? This Visit's Progress  ?  Monitor and Manage My Blood Sugar-Diabetes Type 2   On track  ?  Timeframe:  Long-Range Goal ?Priority:  High ?Start Date:    02/23/20                         ?Expected End Date:   02/23/20                   ? ?Follow Up Date 08/10/20 ?  ?- check blood sugar at prescribed times ?- enter blood sugar readings and medication or insulin into daily log ?- take the blood sugar log to all doctor visits  ?  ?Why is this important?   ?Checking your blood sugar at home helps to keep it from getting very high or very low.  ?Writing the results in a diary or log helps the doctor know how to care for you.  ?Your blood sugar log should have the time, date and the results.  ?Also, write down the amount of insulin or other medicine that you take.  ?Other information, like what you ate, exercise done and how you were feeling, will also be helpful.   ?  ?Notes: Contact providers with any hypoglycemia < 70 ? ?06/26/20 - BG still fluctuating, setting up with Endo, consider Trulicity ?  ? ?  ? ?Patient Care Plan: General Pharmacy (Adult)  ?  ? ?Problem Identified: HTN, DM   ?Priority: High  ?Onset Date: 02/23/2020  ?  ? ?Long-Range Goal: Patient-Specific Goal   ?Start Date: 02/23/2020  ?Expected End Date: 08/22/2020  ?Recent Progress: On track  ?Priority: High  ?Note:   ? ?Current Barriers:  ?Severely elevated A1c ? ? ?Pharmacist Clinical Goal(s):  ?Over the next 90 days, patient will maintain control of blood glucose and blood pressure as evidenced by A1c, home monitoring  through collaboration with PharmD and provider.  ? ? ?Interventions: ?1:1 collaboration with [name], MD regarding development and update of comprehensive plan of care as evidenced by provider attestation and co-signature ?Inter-disciplinary care team collaboration (see longitudinal plan of care) ?Comprehensive medication review performed; medication list updated in electronic  medical record ? ?Hypertension (BP goal <130/80) ?-controlled ?-Current treatment: ?Losartan '50mg'$  Appropriate, Effective, Safe, Accessible ?-Medications previously tried: none noted  ?-Current home readings: doesn't check regularly ?-Current dietary habits: likes to snack, wife describes him as having sweet tooth. ?-Current exercise habits: love to walk outside when weather is nice ?-Denies hypotensive/hypertensive symptoms ?-Educated on BP goals and benefits of medications for prevention of heart attack, stroke and kidney damage; ?Daily salt intake goal < 2300 mg; ?Importance of home blood pressure monitoring; ?-Counseled to monitor BP at home periodically, document, and provide log at future appointments ?-Recommended to continue current medication ? ?Update 06/26/20 ?BP has been controlled they are still checking daily. ?Denies any symptoms ?Continue current medication ? ?Update 10/03/20 ?No issues with BP, they continue to monitor ?No dizziness or HA's ?Continue current meds for now ? ?Update 02/13/21 ?BP has been controlled, no logs at home however. ?Denies any dizziness or HA. ?BP on lower end in office recently. ?IF he does develop any dizziness, etc would recommend dose decrease of losartan to '25mg'$  daily. ?Will FU after visit to endocrine. ? ?Update 05/15/21 ?BP doing well, denies any dizziness at home. ?Recommend recheck in office at physical. ?No  changes to medications at this time. ? ?Diabetes (A1c goal <7%) ?-controlled ?-Current medications: ?Lantus 20 units hs Appropriate, Query effective, ?Pioglitazone '30mg'$  daily Appropriate, Query effective, ?Metformin '1000mg'$  twice daily with a meal Appropriate, Query effective, ?-Medications previously tried: Trulicity  ?-Current home glucose readings ?fasting glucose: 94-150, one reading low 200s when they had a big meal the night before ?-Denies hypoglycemic/hyperglycemic symptoms ?-Current meal patterns:  ?breakfast:  ?lunch:  ?dinner:  ?snacks: low sugar  pudding, low sugar fruit cup ?drinks:  ?-Current exercise: minimal, loves to walk outside when weather is nice ?-Educated onA1c and blood sugar goals; ?Prevention and management of hypoglycemic episodes; ?Benefits of routine self-monitoring of blood sugar; ?-Counseled to check feet daily and get yearly eye exams ?-Recommended to continue current medication ?Assessed current risk for hypoglycemia ? ?Update 06/26/20 ?Sugar Average: 7 day -196; 14 days - 197;  90 days - 179 ?Does report a reading of 48 on 06/24/20 and two days prior sugar was 438. ?Continues to fluctuate and reports he is eating the same thing daily.  Some days he is more active than others. ?They still have not gotten in touch with Endocrinologist - will consult with PCP on correcting referral to the [facility]. ?Patient may benefit from starting back on Trulicity with an empiric reduction in long acting insulin.  They have medicaid now so affordability should not be an issue. ?Will consult with PCP on this and also continue referral to Endocrinology. ? ?Update 10/03/20 ?Sugars have been ranging from around 180-280 since patient fell and fractured his pelvis.  Wife is having a hard time getting him to agree to take his Lantus which has led to the elevated sugars.  They also never heard from [place] about scheduling a visit.  As long as sugars do not shoot up into the 300-400 again patient is OK to recover at home.  Encouraged to give Lantus as much as he will allow.  Will follow up on Endo referral. ?No changes to meds at this time ? ?Update 02/13/21 ?He is not followed by Endocrine for uncontrolled diabetes. ?A1c is now > 12.0 but he has not had visit with Endocrine since changing meds around.  Wife reports sugars are lower but still majority of them are > 200.  Patient with dementia and getting him to follow diet his hard because he likes to have cookies etc. ?Actos recently deceased to '30mg'$  daily. ?He is now more compliant  with insulin.  Patient may benefit from SGLT-2 inhibitor or GLP-1 if A1c remains uncontrolled on current regimen. ?Has upcoming visit with Endocrine, will fu on A1c and make recommendations if needed after this visit. ? ?Update 05/15/21 ?Wife reports glucose is still running > 200 for the most part.  Denies any hypoglycemia.  Still having difficulty getting him to take all of his doses of Lantus due to fighting her to give it.  I do think he would benefit from resuming Trulicity now that they have medicaid and cost will not be an issue. ?Depending on what A1c is we would need to reduce his Lantus.   ?Consider starting Trulicity to replace Actos. ?This would help with compliance with once weekly dosing. ? ?Patient Goals/Self-Care Activities ?Over the next 90 days, patient will:  ?- take medications as prescribed ?check glucose daily, document, and provide at future appointments ?check blood pressure periodically, document, and provide at future appointments ? ?Follow Up Plan: The care management team will reach out to the patient again over the next  90 days.  ? ? ?  ?  ?  ? ?The patient verbalized understanding of instructions, educational materials, and care plan provided today and declined offer to receive copy of patient instructions, educational materials, and care plan.  ?Telephone follow up appointment with pharmacy team member scheduled for: 3 months ? ?Edythe Clarity, [facility]  ?Beverly Milch, PharmD, CPP ?Clinical Pharmacist Practitioner ?[place] ?[phone] ? ?

## 2021-05-17 ENCOUNTER — Other Ambulatory Visit: Payer: Self-pay | Admitting: Family Medicine

## 2021-05-17 DIAGNOSIS — E119 Type 2 diabetes mellitus without complications: Secondary | ICD-10-CM

## 2021-05-19 NOTE — Telephone Encounter (Signed)
Requested medication (s) are due for refill today: yes ? ?Requested medication (s) are on the active medication list: yes ? ?Last refill:  12/27/19 ? ?Future visit scheduled: yes ? ?Notes to clinic:  pt is overdue for a OV but has one upcoming ? ? ?  ?Requested Prescriptions  ?Pending Prescriptions Disp Refills  ? B-D ULTRAFINE III SHORT PEN 31G X 8 MM MISC [Pharmacy Med Name: B-D PEN NDL SHRT 31GX8MM(5/16) BLU] 100 each 3  ?  Sig: USE TO INJECT INSULIN EVERY DAY  ?  ? Endocrinology: Diabetes - Testing Supplies Failed - 05/17/2021  9:40 PM  ?  ?  Failed - Valid encounter within last 12 months  ?  Recent Outpatient Visits   ? ?      ? 1 year ago Primary hypertension  ? [facility] [name], Cammie Mcgee, MD  ? 1 year ago Type 2 diabetes mellitus without complication, with long-term current use of insulin ([place])  ? [facility] [name], Cammie Mcgee, MD  ? 2 years ago Diabetes mellitus type 2 with complications, uncontrolled ([place])  ? [facility] [name], Cammie Mcgee, MD  ? 3 years ago Diabetes mellitus type 2 with complications, uncontrolled ([place])  ? [facility] [name], Cammie Mcgee, MD  ? 3 years ago Diabetes mellitus type 2 with complications, uncontrolled ([place])  ? [facility] [name], Cammie Mcgee, MD  ? ?  ?  ? ? ?  ?  ?  ? ?

## 2021-06-04 ENCOUNTER — Other Ambulatory Visit: Payer: Self-pay | Admitting: Family Medicine

## 2021-06-04 NOTE — Telephone Encounter (Signed)
Is this okay to refill  Last filled 05/05/21 Next ov 06/30/21

## 2021-06-06 ENCOUNTER — Other Ambulatory Visit: Payer: Self-pay | Admitting: Family Medicine

## 2021-06-10 NOTE — Telephone Encounter (Signed)
Requested medication (s) are due for refill today - unknown  Requested medication (s) are on the active medication list -yes- but not at this dose  Future visit scheduled -yes  Last refill: 11/05/20 #30 3RF  Notes to clinic: Dose requested is not current dose on medication list- sent for review   Requested Prescriptions  Pending Prescriptions Disp Refills   pioglitazone (ACTOS) 45 MG tablet [Pharmacy Med Name: PIOGLITAZONE '45MG'$  TABLETS] 90 tablet 1    Sig: TAKE 1 TABLET(45 MG) BY MOUTH AT BEDTIME     Endocrinology:  Diabetes - Glitazones - pioglitazone Failed - 06/06/2021  1:47 PM      Failed - HBA1C is between 0 and 7.9 and within 180 days    HbA1c, POC (controlled diabetic range)  Date Value Ref Range Status  11/05/2020 12.1 (A) 0.0 - 7.0 % Final         Failed - Valid encounter within last 6 months    Recent Outpatient Visits           1 year ago Primary hypertension   [place] [name], MD   1 year ago Type 2 diabetes mellitus without complication, with long-term current use of insulin ([place])   [place] [name], MD   2 years ago Diabetes mellitus type 2 with complications, uncontrolled ([place])   [place] [name], Cammie Mcgee, MD   3 years ago Diabetes mellitus type 2 with complications, uncontrolled ([place])   [place] [name], Cammie Mcgee, MD   3 years ago Diabetes mellitus type 2 with complications, uncontrolled ([place])   [place] [name], Cammie Mcgee, MD                  Requested Prescriptions  Pending Prescriptions Disp Refills   pioglitazone (ACTOS) 45 MG tablet [Pharmacy Med Name: PIOGLITAZONE '45MG'$  TABLETS] 90 tablet 1    Sig: TAKE 1 TABLET(45 MG) BY MOUTH AT BEDTIME     Endocrinology:  Diabetes - Glitazones - pioglitazone Failed - 06/06/2021  1:47 PM      Failed - HBA1C is between 0 and 7.9 and within 180 days    HbA1c, POC (controlled diabetic range)   Date Value Ref Range Status  11/05/2020 12.1 (A) 0.0 - 7.0 % Final         Failed - Valid encounter within last 6 months    Recent Outpatient Visits           1 year ago Primary hypertension   [place] [name], MD   1 year ago Type 2 diabetes mellitus without complication, with long-term current use of insulin ([place])   [place] [name], MD   2 years ago Diabetes mellitus type 2 with complications, uncontrolled ([place])   [place] [name], MD   3 years ago Diabetes mellitus type 2 with complications, uncontrolled ([place])   [place] [name], MD   3 years ago Diabetes mellitus type 2 with complications, uncontrolled ([place])   [facility] [name], Cammie Mcgee, MD

## 2021-06-11 DIAGNOSIS — I10 Essential (primary) hypertension: Secondary | ICD-10-CM

## 2021-06-11 DIAGNOSIS — E1159 Type 2 diabetes mellitus with other circulatory complications: Secondary | ICD-10-CM

## 2021-06-11 DIAGNOSIS — Z794 Long term (current) use of insulin: Secondary | ICD-10-CM

## 2021-06-11 DIAGNOSIS — F17210 Nicotine dependence, cigarettes, uncomplicated: Secondary | ICD-10-CM

## 2021-06-12 ENCOUNTER — Other Ambulatory Visit: Payer: Self-pay | Admitting: Orthopaedic Surgery

## 2021-06-12 MED ORDER — HYDROCODONE-ACETAMINOPHEN 5-325 MG PO TABS: 1.0000 | ORAL_TABLET | Freq: Four times a day (QID) | ORAL | 0 refills | Status: DC | PRN

## 2021-06-12 NOTE — Telephone Encounter (Signed)
Patient/wife (designated contact) called for refill: aware while Dr [name] is out of clinic, our other providers are reviewing refill requests: HYDROcodone-acetaminophen (NORCO/VICODIN) 5-325 MG tablet [facility] on [address], [place]

## 2021-06-27 ENCOUNTER — Other Ambulatory Visit: Payer: Self-pay

## 2021-06-30 ENCOUNTER — Encounter: Payer: Self-pay | Admitting: Family Medicine

## 2021-07-10 ENCOUNTER — Telehealth: Payer: Self-pay

## 2021-07-10 ENCOUNTER — Other Ambulatory Visit: Payer: Self-pay

## 2021-07-10 NOTE — Telephone Encounter (Signed)
I called.  Discussed this will be addressed Monday 07/14/21 if possible, if not then on 07/16/21.

## 2021-07-10 NOTE — Telephone Encounter (Signed)
Will you call patient's wife and discuss his refill.  [phone] I did put a request in to Dr. Brooke Bonito box, but haven't returned her call.

## 2021-07-10 NOTE — Telephone Encounter (Signed)
Hydrocodone-Acetaminophen 5/325 MG  PATIENT USES [facility] ON [address]

## 2021-07-14 MED ORDER — HYDROCODONE-ACETAMINOPHEN 5-325 MG PO TABS: 1.0000 | ORAL_TABLET | Freq: Four times a day (QID) | ORAL | 0 refills | Status: DC | PRN

## 2021-07-23 ENCOUNTER — Other Ambulatory Visit: Payer: Self-pay | Admitting: Family Medicine

## 2021-07-25 ENCOUNTER — Telehealth: Payer: Self-pay | Admitting: Pharmacist

## 2021-07-25 NOTE — Progress Notes (Signed)
Chronic Care Management Pharmacy Assistant   Name: MATHAYUS STANBERY  MRN: [id number] DOB: Aug 22, 1940   Reason for Encounter: Disease State - Diabetes Call     Recent office visits:  None noted  Recent consult visits:  None noted  Hospital visits:  None in previous 6 months  Medications: Outpatient Encounter Medications as of 07/25/2021  Medication Sig   pioglitazone (ACTOS) 45 MG tablet TAKE 1 TABLET(45 MG) BY MOUTH AT BEDTIME   ALPRAZolam (XANAX) 0.25 MG tablet TAKE 1 TABLET BY MOUTH THREE TIMES DAILY AS NEEDED   B-D ULTRAFINE III SHORT PEN 31G X 8 MM MISC USE TO INJECT INSULIN EVERY DAY   Blood Glucose Monitoring Suppl (ONETOUCH VERIO) w/Device KIT USE TO TEST BLOOD SUGAR LEVELS THREE TIMES DAILY   Cholecalciferol (DIALYVITE VITAMIN D 5000) 125 MCG (5000 UT) capsule Take 5,000 Units by mouth daily.   donepezil (ARICEPT) 10 MG tablet TAKE 1 TABLET(10 MG) BY MOUTH AT BEDTIME   HYDROcodone-acetaminophen (NORCO/VICODIN) 5-325 MG tablet Take 1 tablet by mouth every 6 (six) hours as needed for moderate pain (Must last 30 days.).   insulin glargine (LANTUS SOLOSTAR) 100 UNIT/ML Solostar Pen ADMINISTER 15 UNITS UNDER THE SKIN DAILY   Lancets (ONETOUCH DELICA PLUS [id number]) MISC USE TO CHECK BLOOD SUGAR TWICE DAILY. Dx: E11.9.   losartan (COZAAR) 50 MG tablet TAKE 1 TABLET BY MOUTH EVERY NIGHT AT BEDTIME   memantine (NAMENDA) 10 MG tablet TAKE 1 TABLET(10 MG) BY MOUTH TWICE DAILY   metFORMIN (GLUCOPHAGE) 1000 MG tablet TAKE 1 TABLET(1000 MG) BY MOUTH TWICE DAILY WITH A MEAL   ONETOUCH VERIO test strip USE TO TEST BLOOD SUGAR 2 TO 3 TIMES DAILY   pioglitazone (ACTOS) 30 MG tablet Take 1 tablet (30 mg total) by mouth daily.   rosuvastatin (CRESTOR) 40 MG tablet TAKE 1 TABLET(40 MG) BY MOUTH DAILY   No facility-administered encounter medications on file as of 07/25/2021.    Current antihyperglycemic regimen:  Lantus 100u/ml 20 mg at bedtime Pioglitazone 45 mg 1 tablet daily (reported  per wife) Metformin 1072m twice daily with a meal   What recent interventions/DTPs have been made to improve glycemic control:  Patient's wife denied any recent medication changes since last visit with CPP.   Have there been any recent hospitalizations or ED visits since last visit with CPP?  Patient has not had any ED visits or hospitalizations since last visit with CPP.   Patient denies hypoglycemic symptoms, including Pale, Sweaty, Shaky, Hungry, Nervous/irritable, and Vision changes   Patient denies hyperglycemic symptoms, including blurry vision, excessive thirst, fatigue, polyuria, and weakness   How often are you checking your blood sugar? Patients wife reports he is checking his blood sugar twice daily   What are your blood sugars ranging?  Fasting: 154-190 Before meals:  After meals:  Bedtime:   During the week, how often does your blood glucose drop below 70?  Patients wife reported he has had a few low readings in the 70s but they resolved with food or drink. Wife reported it just depended on what patient's eats before he goes to bed but she feels he is doing better on his current dosing of medications.  Are you checking your feet daily/regularly? Patient's wife reports she does check his feet regularly. She denied any current concerns.     Adherence Review: Is the patient currently on a STATIN medication? Yes Is the patient currently on ACE/ARB medication? Yes Does the patient have >5 day gap  between last estimated fill dates? No   Care Gaps   AWV: done 06/28/20 Colonoscopy: done 07/30/14 DM Eye Exam: due 11/04/21 DM Foot Exam: due 10/22/17 Microalbumin: done 05/17/20 HbgAIC: done 11/05/20 (9.2) DEXA: N/A Mammogram: N/A     Star Rating Drugs: Rosuvastatin (CRESTOR) 40 MG tablet - last filled 01/21/2021 90 days  MetFORMIN (GLUCOPHAGE) 1000 MG tablet - last filled 05/28/21 90 days     Future Appointments  Date Time Provider [place]  08/14/2021   3:00 PM [facility] PHARMACIST [facility]  08/19/2021 10:50 AM Sanjuana Kava, MD OCR-OCR None     Jobe Gibbon, [facility] Clinical Pharmacist Assistant  [phone]

## 2021-08-11 ENCOUNTER — Telehealth: Payer: Self-pay

## 2021-08-11 NOTE — Telephone Encounter (Signed)
Hydrocodone-Acetaminophen 5/325 MG  Qty 65 Tablets  PATIENT USES [facility] ON [address]

## 2021-08-12 MED ORDER — HYDROCODONE-ACETAMINOPHEN 5-325 MG PO TABS: 1.0000 | ORAL_TABLET | Freq: Four times a day (QID) | ORAL | 0 refills | Status: DC | PRN

## 2021-08-12 NOTE — Progress Notes (Signed)
Chronic Care Management Pharmacy Note  08/19/2021 Name:  [name] [name] MRN:  [id number] DOB:  Nov 28, 1940  Summary: They no longer want to see Endocrine. Due for appointment with PCP for updated A1c and diabetic foot exam.  Fasting sugars at home seem to be elevated.  Recommendations: Coordinate PCP visit for labs  FU: 3 months  Subjective: [name] [name] is an 81 y.o. year old male who is a primary patient of [name], Cammie Mcgee, MD.  The CCM team was consulted for assistance with disease management and care coordination needs.    Engaged with patient by telephone for follow up visit in response to provider referral for pharmacy case management and/or care coordination services.   Consent to Services:  The patient was given the following information about Chronic Care Management services today, agreed to services, and gave verbal consent: 1. CCM service includes personalized support from designated clinical staff supervised by the primary care provider, including individualized plan of care and coordination with other care providers 2. 24/7 contact phone numbers for assistance for urgent and routine care needs. 3. Service will only be billed when office clinical staff spend 20 minutes or more in a month to coordinate care. 4. Only one practitioner may furnish and bill the service in a calendar month. 5.The patient may stop CCM services at any time (effective at the end of the month) by phone call to the office staff. 6. The patient will be responsible for cost sharing (co-pay) of up to 20% of the service fee (after annual deductible is met). Patient agreed to services and consent obtained.  Patient Care Team: [name], MD as PCP - General (Family Medicine) Edythe Clarity, [facility] as Pharmacist (Pharmacist) Edythe Clarity, [facility] as Pharmacist (Pharmacist)  Recent office visits:  None noted.    Recent consult visits:  None noted.    Hospital visits:  None in previous 6  months  Objective:  Lab Results  Component Value Date   CREATININE 0.87 05/17/2020   BUN 13 05/17/2020   GFRNONAA 82 05/17/2020   GFRAA 95 05/17/2020   NA 141 05/17/2020   K 4.3 05/17/2020   CALCIUM 9.6 05/17/2020   CO2 28 05/17/2020   GLUCOSE 117 (H) 05/17/2020    Lab Results  Component Value Date/Time   HGBA1C 12.1 (A) 11/05/2020 02:33 PM   HGBA1C 9.2 (H) 05/17/2020 11:41 AM   HGBA1C 8.7 (H) 07/20/2019 12:41 PM   MICROALBUR 0.6 05/11/2016 09:51 AM   MICROALBUR <0.2 03/15/2015 04:31 PM    Last diabetic Eye exam:  Lab Results  Component Value Date/Time   HMDIABEYEEXA No Retinopathy 11/04/2020 12:00 AM    Last diabetic Foot exam: No results found for: "HMDIABFOOTEX"   Lab Results  Component Value Date   CHOL 136 05/17/2020   HDL 53 05/17/2020   LDLCALC 65 05/17/2020   LDLDIRECT 109 (H) 04/07/2017   TRIG 99 05/17/2020   CHOLHDL 2.6 05/17/2020       Latest Ref Rng & Units 05/17/2020   11:41 AM 07/20/2019   12:41 PM 10/06/2018    2:09 PM  Hepatic Function  Total Protein 6.1 - 8.1 g/dL 6.5  6.8  6.1   AST 10 - 35 U/L _0 ALT 9 - 46 U/L _1 Total Bilirubin 0.2 - 1.2 mg/dL 0.4  0.4  0.3     Lab Results  Component Value Date/Time   TSH 2.45  11/07/2015 08:43 AM       Latest Ref Rng & Units 05/17/2020   11:41 AM 07/20/2019   12:41 PM 10/06/2018    2:09 PM  CBC  WBC 3.8 - 10.8 Thousand/uL 8.7  10.7  12.0   Hemoglobin 13.2 - 17.1 g/dL 12.9  13.9  13.0   Hematocrit 38.5 - 50.0 % 40.4  41.6  38.8   Platelets 140 - 400 Thousand/uL 244  222  222     Lab Results  Component Value Date/Time   VD25OH 58 05/17/2020 11:41 AM    Clinical ASCVD: No  The ASCVD Risk score (Arnett DK, et al., 2019) failed to calculate for the following reasons:   The 2019 ASCVD risk score is only valid for ages 65 to 24   The patient has a prior MI or stroke diagnosis       06/28/2020    2:16 PM 05/17/2020   11:08 AM 08/17/2017    4:53 PM  Depression screen PHQ 2/9   Decreased Interest 0 2 2  Down, Depressed, Hopeless 0 0 0  PHQ - 2 Score 0 2 2  Altered sleeping 0 1 1  Tired, decreased energy 0 1 3  Change in appetite 0 0 0  Feeling bad or failure about yourself  0 0 0  Trouble concentrating 0 2 0  Moving slowly or fidgety/restless 0 0 1  Suicidal thoughts 0 0 0  PHQ-9 Score 0 6 7  Difficult doing work/chores Not difficult at all Somewhat difficult Very difficult     Social History   Tobacco Use  Smoking Status Every Day   Packs/day: 1.00   Years: 60.00   Total pack years: 60.00   Types: Cigarettes  Smokeless Tobacco Never   BP Readings from Last 3 Encounters:  08/19/21 (!) 153/83  11/19/20 (!) 104/56  11/05/20 (!) 82/48   Pulse Readings from Last 3 Encounters:  08/19/21 (!) 122  11/19/20 88  11/05/20 84   Wt Readings from Last 3 Encounters:  08/19/21 129 lb (58.5 kg)  11/19/20 137 lb (62.1 kg)  11/05/20 134 lb (60.8 kg)   BMI Readings from Last 3 Encounters:  08/19/21 19.05 kg/m  11/19/20 20.23 kg/m  11/05/20 19.79 kg/m    Assessment/Interventions: Review of patient past medical history, allergies, medications, health status, including review of consultants reports, laboratory and other test data, was performed as part of comprehensive evaluation and provision of chronic care management services.   SDOH:  (Social Determinants of Health) assessments and interventions performed: Yes  Financial Resource Strain: Low Risk  (06/28/2020)   Overall Financial Resource Strain (CARDIA)    Difficulty of Paying Living Expenses: Not hard at all   Food Insecurity: No Food Insecurity (06/28/2020)   Hunger Vital Sign    Worried About Running Out of Food in the Last Year: Never true    Ran Out of Food in the Last Year: Never true    SDOH Screenings   Alcohol Screen: Low Risk  (06/28/2020)   Alcohol Screen    Last Alcohol Screening Score (AUDIT): 0  Depression (PHQ2-9): Low Risk  (06/28/2020)   Depression (PHQ2-9)    PHQ-2 Score:  0  Recent Concern: Depression (PHQ2-9) - Medium Risk (05/17/2020)   Depression (PHQ2-9)    PHQ-2 Score: 6  Financial Resource Strain: Low Risk  (06/28/2020)   Overall Financial Resource Strain (CARDIA)    Difficulty of Paying Living Expenses: Not hard at all  Food Insecurity: No Food Insecurity (  06/28/2020)   Hunger Vital Sign    Worried About Running Out of Food in the Last Year: Never true    [place] in the Last Year: Never true  Housing: Low Risk  (06/28/2020)   Housing    Last Housing Risk Score: 0  Physical Activity: Inactive (06/28/2020)   Exercise Vital Sign    Days of Exercise per Week: 0 days    Minutes of Exercise per Session: 0 min  Social Connections: Moderately Integrated (06/28/2020)   Social Connection and Isolation Panel [NHANES]    Frequency of Communication with Friends and Family: More than three times a week    Frequency of Social Gatherings with Friends and Family: Three times a week    Attends Religious Services: More than 4 times per year    Active Member of Clubs or Organizations: No    Attends Archivist Meetings: Never    Marital Status: Married  Stress: No Stress Concern Present (06/28/2020)   [place]    Feeling of Stress : Not at all  Tobacco Use: High Risk (08/19/2021)   Patient History    Smoking Tobacco Use: Every Day    Smokeless Tobacco Use: Never    Passive Exposure: Not on file  Transportation Needs: No Transportation Needs (06/28/2020)   PRAPARE - Transportation    Lack of Transportation (Medical): No    Lack of Transportation (Non-Medical): No    CCM Care Plan  Allergies  Allergen Reactions   Invokana [Canagliflozin] Palpitations, Rash and Other (See Comments)    Rash, tachycardia,weight loss    Plavix [Clopidogrel Bisulfate]     Upset stomach    Medications Reviewed Today     Reviewed by Edythe Clarity, [facility] (Pharmacist) on 08/19/21 at 1449  Med  List Status: <None>   Medication Order Taking? Sig Documenting Provider Last Dose Status Informant  ALPRAZolam (XANAX) 0.25 MG tablet [id number] Yes TAKE 1 TABLET BY MOUTH THREE TIMES DAILY AS NEEDED [name], MD Taking Active   B-D ULTRAFINE III SHORT PEN 31G X 8 MM MISC [id number] Yes USE TO INJECT INSULIN EVERY DAY [name], MD Taking Active   Blood Glucose Monitoring Suppl [facility] VERIO) w/Device Drucie Opitz [id number] Yes USE TO TEST BLOOD SUGAR LEVELS THREE TIMES DAILY Orlena Sheldon, PA-C Taking Active Spouse/Significant Other  Cholecalciferol (DIALYVITE VITAMIN D 5000) 125 MCG (5000 UT) capsule [id number] Yes Take 5,000 Units by mouth daily. [provider] Taking Active   donepezil (ARICEPT) 10 MG tablet [id number] Yes TAKE 1 TABLET(10 MG) BY MOUTH AT BEDTIME [name], MD Taking Active   HYDROcodone-acetaminophen (NORCO/VICODIN) 5-325 MG tablet [id number] Yes Take 1 tablet by mouth every 6 (six) hours as needed for moderate pain (Must last 30 days.). Sanjuana Kava, MD Taking Active   insulin glargine (LANTUS SOLOSTAR) 100 UNIT/ML Solostar Pen [id number] Yes ADMINISTER 15 UNITS UNDER THE SKIN DAILY [name], MD Taking Active   Lancets (ONETOUCH DELICA PLUS XBDZHG99M) [place] [id number] Yes USE TO CHECK BLOOD SUGAR TWICE DAILY. Dx: E11.9. [name], MD Taking Active   losartan (COZAAR) 50 MG tablet [id number] Yes TAKE 1 TABLET BY MOUTH EVERY NIGHT AT BEDTIME [name], MD Taking Active   memantine (NAMENDA) 10 MG tablet [id number] Yes TAKE 1 TABLET(10 MG) BY MOUTH TWICE DAILY [name], MD Taking Active   metFORMIN (GLUCOPHAGE) 1000 MG tablet [id number] Yes TAKE 1 TABLET(1000 MG) BY MOUTH  TWICE DAILY WITH A MEAL [name], MD Taking Active   [facility] VERIO test strip [id number] Yes USE TO TEST BLOOD SUGAR 2 TO 3 TIMES DAILY [name], MD Taking Active   pioglitazone (ACTOS) 30 MG tablet [id number] Yes Take 1 tablet (30 mg total) by  mouth daily. Cassandria Anger, MD Taking Active   pioglitazone (ACTOS) 45 MG tablet [id number] Yes TAKE 1 TABLET(45 MG) BY MOUTH AT BEDTIME [name], MD Taking Active   rosuvastatin (CRESTOR) 40 MG tablet [id number] Yes TAKE 1 TABLET(40 MG) BY MOUTH DAILY [name], MD Taking Active             Patient Active Problem List   Diagnosis Date Noted   Vitamin D deficiency 05/17/2020   Cerebrovascular accident (CVA) ([place]) 05/17/2020   Carotid stenosis 06/15/2016   Carotid disease, bilateral ([place]) 04/12/2015   History of colonic polyps    Iron deficiency anemia 12/21/2013   Smoker 12/21/2013   Anxiety    Diabetes mellitus ([place])    Diabetes mellitus type 2, uncomplicated ([place])    Hyperlipemia    Hypertension    Cellulitis of foot, left 08/09/2010    Immunization History  Administered Date(s) Administered   Influenza, High Dose Seasonal PF 10/22/2016, 01/24/2018   Influenza,inj,Quad PF,6+ Mos 10/22/2014, 11/07/2015   Influenza-Unspecified 09/12/2013   Pneumococcal Conjugate-13 11/07/2015   Pneumococcal Polysaccharide-23 06/26/2014   Tdap 03/09/2016    Conditions to be addressed/monitored:  HTN, HLD, DM  Care Plan : General Pharmacy (Adult)  Updates made by Edythe Clarity, RPH since 08/19/2021 12:00 AM     Problem: HTN, DM   Priority: High  Onset Date: 02/23/2020     Long-Range Goal: Patient-Specific Goal   Start Date: 02/23/2020  Expected End Date: 08/22/2020  Recent Progress: On track  Priority: High  Note:    Current Barriers:  Severely elevated A1c   Pharmacist Clinical Goal(s):  Over the next 90 days, patient will maintain control of blood glucose and blood pressure as evidenced by A1c, home monitoring  through collaboration with PharmD and provider.    Interventions: 1:1 collaboration with [name], MD regarding development and update of comprehensive plan of care as evidenced by provider attestation and  co-signature Inter-disciplinary care team collaboration (see longitudinal plan of care) Comprehensive medication review performed; medication list updated in electronic medical record  Hypertension (BP goal <130/80) 08/14/21 -controlled -Current treatment: Losartan 55m Appropriate, Effective, Safe, Accessible -Medications previously tried: none noted  -Current home readings: doesn't check regularly -Current dietary habits: likes to snack, wife describes him as having sweet tooth. -Current exercise habits: love to walk outside when weather is nice -Denies hypotensive/hypertensive symptoms -BP has been well controlled, denies any dizziness. -Last few office BP has been low, continue to monitor for fall risk. -Also due for diabetic foot exam.  Update 06/26/20 BP has been controlled they are still checking daily. Denies any symptoms Continue current medication  Update 10/03/20 No issues with BP, they continue to monitor No dizziness or HA's Continue current meds for now  Update 02/13/21 BP has been controlled, no logs at home however. Denies any dizziness or HA. BP on lower end in office recently. IF he does develop any dizziness, etc would recommend dose decrease of losartan to 216mdaily. Will FU after visit to endocrine.  Update 05/15/21 BP doing well, denies any dizziness at home. Recommend recheck in office at physical. No changes to medications at this time.  Diabetes (  A1c goal <7%) 08/19/21 -Uncontrolled -Current medications: Lantus 15 units hs Appropriate, Query effective, Pioglitazone 90m daily Appropriate, Query effective, Metformin 10068mtwice daily with a meal Appropriate, Query effective, -Medications previously tried: Trulicity  -Current home glucose readings - glucose still elevated recently per his wife, most recent A1c still uncontrolled.  They have not been to see Dr. PiDennard Schaumannor update in a while -Denies hypoglycemic/hyperglycemic symptoms -Current  meal patterns:  breakfast:  lunch:  dinner:  snacks: low sugar pudding, low sugar fruit cup drinks:  -Current exercise: minimal, loves to walk outside when weather is nice -Educated onA1c and blood sugar goals; -They have stopped seeing endocrine as the patient did not want to go back.  Continues on same medications as before.  He switched back to the 3021mctos.  I believe he first needs to come in for A1c recheck.  At that point consider restarting Trulicity or increasing basal insulin to better control glucose readings.  Will collaborate with PCP office to schedule FU visit with them.  Update 06/26/20 Sugar Average: 7 day -196; 14 days - 197;  90 days - 179 Does report a reading of 48 on 06/24/20 and two days prior sugar was 438. Continues to fluctuate and reports he is eating the same thing daily.  Some days he is more active than others. They still have not gotten in touch with Endocrinologist - will consult with PCP on correcting referral to the [place] may benefit from starting back on Trulicity with an empiric reduction in long acting insulin.  They have medicaid now so affordability should not be an issue. Will consult with PCP on this and also continue referral to Endocrinology.  Update 10/03/20 Sugars have been ranging from around 180-280 since patient fell and fractured his pelvis.  Wife is having a hard time getting him to agree to take his Lantus which has led to the elevated sugars.  They also never heard from [facility] scheduling a visit.  As long as sugars do not shoot up into the 300-400 again patient is OK to recover at home.  Encouraged to give Lantus as much as he will allow.  Will follow up on Endo referral. No changes to meds at this time  Update 02/13/21 He is not followed by Endocrine for uncontrolled diabetes. A1c is now > 12.0 but he has not had visit with Endocrine since changing meds around.  Wife reports sugars are lower but  still majority of them are > 200.  Patient with dementia and getting him to follow diet his hard because he likes to have cookies etc. Actos recently deceased to 26m2mily. He is now more compliant with insulin.  Patient may benefit from SGLT-2 inhibitor or GLP-1 if A1c remains uncontrolled on current regimen. Has upcoming visit with Endocrine, will fu on A1c and make recommendations if needed after this visit.  Update 05/15/21 Wife reports glucose is still running > 200 for the most part.  Denies any hypoglycemia.  Still having difficulty getting him to take all of his doses of Lantus due to fighting her to give it.  I do think he would benefit from resuming Trulicity now that they have medicaid and cost will not be an issue. Depending on what A1c is we would need to reduce his Lantus.   Consider starting Trulicity to replace Actos. This would help with compliance with once weekly dosing.  Patient Goals/Self-Care Activities Over the next 90 days, patient will:  - take  medications as prescribed check glucose daily, document, and provide at future appointments check blood pressure periodically, document, and provide at future appointments  Follow Up Plan: The care management team will reach out to the patient again over the next 90 days.              Medication Assistance: None required.  Patient affirms current coverage meets needs.  Compliance/Adherence/Medication fill history: Care Gaps: Needs foot exam  Star Medications: Losartan 40m 07/23/21 90ds Metformin 10050m06/29/23 90ds Rosuvastatin 4058m5/17/23 90ds  [facility] [id number]: [phone]: [phone] pill box? Yes Pt endorses 100% compliance  We discussed: Benefits of medication synchronization, packaging and delivery as well as  enhanced pharmacist oversight with [employer]. Patient decided to: Continue current medication management strategy  Care Plan and Follow Up Patient Decision:  Patient agrees to Care Plan and Follow-up.  Plan: The care management team will reach out to the patient again over the next 90 days.  ChrBeverly MilchharmD Clinical Pharmacist [phone]

## 2021-08-14 ENCOUNTER — Ambulatory Visit: Payer: PPO | Admitting: Pharmacist

## 2021-08-14 DIAGNOSIS — I10 Essential (primary) hypertension: Secondary | ICD-10-CM

## 2021-08-14 DIAGNOSIS — Z794 Long term (current) use of insulin: Secondary | ICD-10-CM

## 2021-08-19 ENCOUNTER — Ambulatory Visit (INDEPENDENT_AMBULATORY_CARE_PROVIDER_SITE_OTHER): Payer: PPO | Admitting: Orthopaedic Surgery

## 2021-08-19 ENCOUNTER — Encounter: Payer: Self-pay | Admitting: Orthopaedic Surgery

## 2021-08-19 VITALS — BP 153/83 | HR 122 | Ht 69.0 in | Wt 129.0 lb

## 2021-08-19 DIAGNOSIS — F17210 Nicotine dependence, cigarettes, uncomplicated: Secondary | ICD-10-CM | POA: Diagnosis not present

## 2021-08-19 DIAGNOSIS — F0280 Dementia in other diseases classified elsewhere without behavioral disturbance: Secondary | ICD-10-CM

## 2021-08-19 DIAGNOSIS — G309 Alzheimer's disease, unspecified: Secondary | ICD-10-CM

## 2021-08-19 DIAGNOSIS — G8929 Other chronic pain: Secondary | ICD-10-CM

## 2021-08-19 DIAGNOSIS — M5441 Lumbago with sciatica, right side: Secondary | ICD-10-CM | POA: Diagnosis not present

## 2021-08-19 NOTE — Progress Notes (Signed)
He hurts at times.  He has dementia and his wife gives history.  He has lower back pain.  He has no new trauma.  He uses a cane.  He is disoriented to time and place today.  He has no weakness.  Spine/Pelvis examination:  Inspection:  Overall, sacoiliac joint benign and hips nontender; without crepitus or defects.   Thoracic spine inspection: Alignment normal without kyphosis present   Lumbar spine inspection:  Alignment  with normal lumbar lordosis, without scoliosis apparent.   Thoracic spine palpation:  without tenderness of spinal processes   Lumbar spine palpation: without tenderness of lumbar area; without tightness of lumbar muscles    Range of Motion:   Lumbar flexion, forward flexion is normal without pain or tenderness    Lumbar extension is full without pain or tenderness   Left lateral bend is normal without pain or tenderness   Right lateral bend is normal without pain or tenderness   Straight leg raising is normal  Strength & tone: normal   Stability overall normal stability  Encounter Diagnoses  Name Primary?   Chronic right-sided low back pain with right-sided sciatica Yes   Cigarette nicotine dependence without complication    Alzheimer disease ([place])    Return in three months.  I would prefer his family doctor managed pain medicine.  She will talk to the primary care.  Call if any problem.  Precautions discussed.  Electronically Signed Sanjuana Kava, MD 8/8/202310:17 AM

## 2021-08-19 NOTE — Patient Instructions (Addendum)
Visit Information   Goals Addressed             This Visit's Progress    Monitor and Manage My Blood Sugar-Diabetes Type 2   On track    Timeframe:  Long-Range Goal Priority:  High Start Date:    02/23/20                         Expected End Date:   02/23/20                    Follow Up Date 08/10/20   - check blood sugar at prescribed times - enter blood sugar readings and medication or insulin into daily log - take the blood sugar log to all doctor visits    Why is this important?   Checking your blood sugar at home helps to keep it from getting very high or very low.  Writing the results in a diary or log helps the doctor know how to care for you.  Your blood sugar log should have the time, date and the results.  Also, write down the amount of insulin or other medicine that you take.  Other information, like what you ate, exercise done and how you were feeling, will also be helpful.     Notes: Contact providers with any hypoglycemia < 70  06/26/20 - BG still fluctuating, setting up with Endo, consider Trulicity       Patient Care Plan: General Pharmacy (Adult)     Problem Identified: HTN, DM   Priority: High  Onset Date: 02/23/2020     Long-Range Goal: Patient-Specific Goal   Start Date: 02/23/2020  Expected End Date: 08/22/2020  Recent Progress: On track  Priority: High  Note:    Current Barriers:  Severely elevated A1c   Pharmacist Clinical Goal(s):  Over the next 90 days, patient will maintain control of blood glucose and blood pressure as evidenced by A1c, home monitoring  through collaboration with PharmD and provider.    Interventions: 1:1 collaboration with [name], MD regarding development and update of comprehensive plan of care as evidenced by provider attestation and co-signature Inter-disciplinary care team collaboration (see longitudinal plan of care) Comprehensive medication review performed; medication list updated in electronic  medical record  Hypertension (BP goal <130/80) 08/14/21 -controlled -Current treatment: Losartan '50mg'$  Appropriate, Effective, Safe, Accessible -Medications previously tried: none noted  -Current home readings: doesn't check regularly -Current dietary habits: likes to snack, wife describes him as having sweet tooth. -Current exercise habits: love to walk outside when weather is nice -Denies hypotensive/hypertensive symptoms -BP has been well controlled, denies any dizziness. -Last few office BP has been low, continue to monitor for fall risk. -Also due for diabetic foot exam.  Update 06/26/20 BP has been controlled they are still checking daily. Denies any symptoms Continue current medication  Update 10/03/20 No issues with BP, they continue to monitor No dizziness or HA's Continue current meds for now  Update 02/13/21 BP has been controlled, no logs at home however. Denies any dizziness or HA. BP on lower end in office recently. IF he does develop any dizziness, etc would recommend dose decrease of losartan to '25mg'$  daily. Will FU after visit to endocrine.  Update 05/15/21 BP doing well, denies any dizziness at home. Recommend recheck in office at physical. No changes to medications at this time.  Diabetes (A1c goal <7%) 08/19/21 -Uncontrolled -Current medications: Lantus 15 units hs Appropriate, Query  effective, Pioglitazone '30mg'$  daily Appropriate, Query effective, Metformin '1000mg'$  twice daily with a meal Appropriate, Query effective, -Medications previously tried: Trulicity  -Current home glucose readings - glucose still elevated recently per his wife, most recent A1c still uncontrolled.  They have not been to see Dr. [name] for update in a while -Denies hypoglycemic/hyperglycemic symptoms -Current meal patterns:  breakfast:  lunch:  dinner:  snacks: low sugar pudding, low sugar fruit cup drinks:  -Current exercise: minimal, loves to walk outside when weather is  nice -Educated onA1c and blood sugar goals; -They have stopped seeing endocrine as the patient did not want to go back.  Continues on same medications as before.  He switched back to the '30mg'$  Actos.  I believe he first needs to come in for A1c recheck.  At that point consider restarting Trulicity or increasing basal insulin to better control glucose readings.  Will collaborate with PCP office to schedule FU visit with them.  Update 06/26/20 Sugar Average: 7 day -196; 14 days - 197;  90 days - 179 Does report a reading of 48 on 06/24/20 and two days prior sugar was 438. Continues to fluctuate and reports he is eating the same thing daily.  Some days he is more active than others. They still have not gotten in touch with Endocrinologist - will consult with PCP on correcting referral to the [place]. Patient may benefit from starting back on Trulicity with an empiric reduction in long acting insulin.  They have medicaid now so affordability should not be an issue. Will consult with PCP on this and also continue referral to Endocrinology.  Update 10/03/20 Sugars have been ranging from around 180-280 since patient fell and fractured his pelvis.  Wife is having a hard time getting him to agree to take his Lantus which has led to the elevated sugars.  They also never heard from [place] about scheduling a visit.  As long as sugars do not shoot up into the 300-400 again patient is OK to recover at home.  Encouraged to give Lantus as much as he will allow.  Will follow up on Endo referral. No changes to meds at this time  Update 02/13/21 He is not followed by Endocrine for uncontrolled diabetes. A1c is now > 12.0 but he has not had visit with Endocrine since changing meds around.  Wife reports sugars are lower but still majority of them are > 200.  Patient with dementia and getting him to follow diet his hard because he likes to have cookies etc. Actos recently deceased to '30mg'$   daily. He is now more compliant with insulin.  Patient may benefit from SGLT-2 inhibitor or GLP-1 if A1c remains uncontrolled on current regimen. Has upcoming visit with Endocrine, will fu on A1c and make recommendations if needed after this visit.  Update 05/15/21 Wife reports glucose is still running > 200 for the most part.  Denies any hypoglycemia.  Still having difficulty getting him to take all of his doses of Lantus due to fighting her to give it.  I do think he would benefit from resuming Trulicity now that they have medicaid and cost will not be an issue. Depending on what A1c is we would need to reduce his Lantus.   Consider starting Trulicity to replace Actos. This would help with compliance with once weekly dosing.  Patient Goals/Self-Care Activities Over the next 90 days, patient will:  - take medications as prescribed check glucose daily, document, and provide at future appointments check  blood pressure periodically, document, and provide at future appointments  Follow Up Plan: The care management team will reach out to the patient again over the next 90 days.           The patient verbalized understanding of instructions, educational materials, and care plan provided today and DECLINED offer to receive copy of patient instructions, educational materials, and care plan.  Telephone follow up appointment with pharmacy team member scheduled for: 3 months  Edythe Clarity, [place], PharmD, [place] Clinical Pharmacist Practitioner [place] [phone]

## 2021-09-02 DIAGNOSIS — E1165 Type 2 diabetes mellitus with hyperglycemia: Secondary | ICD-10-CM | POA: Diagnosis not present

## 2021-09-02 DIAGNOSIS — F0390 Unspecified dementia without behavioral disturbance: Secondary | ICD-10-CM | POA: Diagnosis not present

## 2021-09-02 DIAGNOSIS — F1120 Opioid dependence, uncomplicated: Secondary | ICD-10-CM | POA: Diagnosis not present

## 2021-09-02 DIAGNOSIS — F17210 Nicotine dependence, cigarettes, uncomplicated: Secondary | ICD-10-CM | POA: Diagnosis not present

## 2021-09-02 DIAGNOSIS — E559 Vitamin D deficiency, unspecified: Secondary | ICD-10-CM | POA: Diagnosis not present

## 2021-09-02 DIAGNOSIS — F411 Generalized anxiety disorder: Secondary | ICD-10-CM | POA: Diagnosis not present

## 2021-09-02 DIAGNOSIS — E1169 Type 2 diabetes mellitus with other specified complication: Secondary | ICD-10-CM | POA: Diagnosis not present

## 2021-09-02 DIAGNOSIS — Z794 Long term (current) use of insulin: Secondary | ICD-10-CM | POA: Diagnosis not present

## 2021-09-02 DIAGNOSIS — E785 Hyperlipidemia, unspecified: Secondary | ICD-10-CM | POA: Diagnosis not present

## 2021-09-02 DIAGNOSIS — E1136 Type 2 diabetes mellitus with diabetic cataract: Secondary | ICD-10-CM | POA: Diagnosis not present

## 2021-09-02 DIAGNOSIS — J449 Chronic obstructive pulmonary disease, unspecified: Secondary | ICD-10-CM | POA: Diagnosis not present

## 2021-09-02 DIAGNOSIS — G8929 Other chronic pain: Secondary | ICD-10-CM | POA: Diagnosis not present

## 2021-09-04 ENCOUNTER — Other Ambulatory Visit: Payer: Self-pay | Admitting: Family Medicine

## 2021-09-04 NOTE — Telephone Encounter (Signed)
Requested medication (s) are due for refill today: yes and no  Requested medication (s) are on the active medication list: yes  Last refill:  Xanax 06/05/21 #60/2, Actos 06/10/21 #90/1  Future visit scheduled: yes  Notes to clinic:  pt is due for appt and updated labs for Actos, not delegated for Xanax.      Requested Prescriptions  Pending Prescriptions Disp Refills   ALPRAZolam (XANAX) 0.25 MG tablet [Pharmacy Med Name: ALPRAZOLAM 0.'25MG'$  TABLETS] 60 tablet     Sig: TAKE 1 TABLET BY MOUTH THREE TIMES DAILY AS NEEDED     Not Delegated - Psychiatry: Anxiolytics/Hypnotics 2 Failed - 09/04/2021  2:18 PM      Failed - This refill cannot be delegated      Failed - Urine Drug Screen completed in last 360 days      Failed - Valid encounter within last 6 months    Recent Outpatient Visits           1 year ago Primary hypertension   [place], Warren T, MD   2 years ago Type 2 diabetes mellitus without complication, with long-term current use of insulin ([place])   [place] [name], MD   2 years ago Diabetes mellitus type 2 with complications, uncontrolled ([place])   [place] [name], MD   3 years ago Diabetes mellitus type 2 with complications, uncontrolled ([place])   [place] [name], Cammie Mcgee, MD   4 years ago Diabetes mellitus type 2 with complications, uncontrolled ([place])   [place] [name], Cammie Mcgee, MD       Future Appointments             In 1 week [name], Cammie Mcgee, MD [place], [place] - Patient is not pregnant       pioglitazone (ACTOS) 45 MG tablet [Pharmacy Med Name: PIOGLITAZONE '45MG'$  TABLETS] 90 tablet 1    Sig: TAKE 1 TABLET(45 MG) BY MOUTH AT BEDTIME     Endocrinology:  Diabetes - Glitazones - pioglitazone Failed - 09/04/2021  2:18 PM      Failed - HBA1C is between 0 and 7.9 and within 180 days    HbA1c, POC  (controlled diabetic range)  Date Value Ref Range Status  11/05/2020 12.1 (A) 0.0 - 7.0 % Final         Failed - Valid encounter within last 6 months    Recent Outpatient Visits           1 year ago Primary hypertension   [place], Warren T, MD   2 years ago Type 2 diabetes mellitus without complication, with long-term current use of insulin ([place])   [place] [name], MD   2 years ago Diabetes mellitus type 2 with complications, uncontrolled ([place])   [place] [name], MD   3 years ago Diabetes mellitus type 2 with complications, uncontrolled ([place])   [place] [name], MD   4 years ago Diabetes mellitus type 2 with complications, uncontrolled ([place])   [place], Cammie Mcgee, MD       Future Appointments             In 1 week [name], Cammie Mcgee, MD [place], [facility]

## 2021-09-11 ENCOUNTER — Telehealth: Payer: Self-pay | Admitting: Orthopaedic Surgery

## 2021-09-11 MED ORDER — HYDROCODONE-ACETAMINOPHEN 5-325 MG PO TABS
1.0000 | ORAL_TABLET | Freq: Four times a day (QID) | ORAL | 0 refills | Status: DC | PRN
Start: 2021-09-11 — End: 2021-10-10

## 2021-09-11 NOTE — Telephone Encounter (Signed)
Patient requests refill: HYDROcodone-acetaminophen (NORCO/VICODIN) 5-325 MG tablet 60 tablet       [address], [place]

## 2021-09-12 ENCOUNTER — Ambulatory Visit (INDEPENDENT_AMBULATORY_CARE_PROVIDER_SITE_OTHER): Payer: PPO | Admitting: Family Medicine

## 2021-09-12 VITALS — BP 112/62 | HR 87 | Ht 69.0 in | Wt 130.6 lb

## 2021-09-12 DIAGNOSIS — I639 Cerebral infarction, unspecified: Secondary | ICD-10-CM | POA: Diagnosis not present

## 2021-09-12 DIAGNOSIS — F17200 Nicotine dependence, unspecified, uncomplicated: Secondary | ICD-10-CM | POA: Diagnosis not present

## 2021-09-12 DIAGNOSIS — E119 Type 2 diabetes mellitus without complications: Secondary | ICD-10-CM

## 2021-09-12 DIAGNOSIS — I10 Essential (primary) hypertension: Secondary | ICD-10-CM

## 2021-09-12 DIAGNOSIS — Z794 Long term (current) use of insulin: Secondary | ICD-10-CM
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
## 2021-09-17 LAB — COMPLETE METABOLIC PANEL WITH GFR
AG Ratio: 1.7 (calc) (ref 1.0–2.5)
ALT: 14 U/L (ref 9–46)
AST: 14 U/L (ref 10–35)
Albumin: 4 g/dL (ref 3.6–5.1)
Alkaline phosphatase (APISO): 50 U/L (ref 35–144)
BUN: 12 mg/dL (ref 7–25)
CO2: 27 mmol/L (ref 20–32)
Calcium: 9.4 mg/dL (ref 8.6–10.3)
Chloride: 102 mmol/L (ref 98–110)
Creat: 0.89 mg/dL (ref 0.70–1.22)
Globulin: 2.3 g/dL (calc) (ref 1.9–3.7)
Glucose, Bld: 229 mg/dL — ABNORMAL HIGH (ref 65–99)
Potassium: 4.5 mmol/L (ref 3.5–5.3)
Sodium: 140 mmol/L (ref 135–146)
Total Bilirubin: 0.4 mg/dL (ref 0.2–1.2)
Total Protein: 6.3 g/dL (ref 6.1–8.1)
eGFR: 87 mL/min/{1.73_m2} (ref >=60)

## 2021-09-17 LAB — CBC WITH DIFFERENTIAL/PLATELET
Absolute Monocytes: 735 cells/uL (ref 200–950)
Basophils Absolute: 102 cells/uL (ref 0–200)
Basophils Relative: 0.9 %
Eosinophils Absolute: 215 cells/uL (ref 15–500)
Eosinophils Relative: 1.9 %
HCT: 41.8 % (ref 38.5–50.0)
Hemoglobin: 14.1 g/dL (ref 13.2–17.1)
Lymphs Abs: 3141 cells/uL (ref 850–3900)
MCH: 31.9 pg (ref 27.0–33.0)
MCHC: 33.7 g/dL (ref 32.0–36.0)
MCV: 94.6 fL (ref 80.0–100.0)
MPV: 10.9 fL (ref 7.5–12.5)
Monocytes Relative: 6.5 %
Neutro Abs: 7108 cells/uL (ref 1500–7800)
Neutrophils Relative %: 62.9 %
Platelets: 234 10*3/uL (ref 140–400)
RBC: 4.42 10*6/uL (ref 4.20–5.80)
RDW: 13 % (ref 11.0–15.0)
Total Lymphocyte: 27.8 %
WBC: 11.3 10*3/uL — ABNORMAL HIGH (ref 3.8–10.8)

## 2021-09-17 LAB — LIPID PANEL
Cholesterol: 112 mg/dL (ref <200)
HDL: 54 mg/dL (ref >=40)
LDL Cholesterol (Calc): 38 mg/dL (calc)
Non-HDL Cholesterol (Calc): 58 mg/dL (calc) (ref <130)
Total CHOL/HDL Ratio: 2.1 (calc) (ref <5.0)
Triglycerides: 122 mg/dL (ref <150)

## 2021-09-17 LAB — MICROALBUMIN, URINE

## 2021-09-17 LAB — HEMOGLOBIN A1C
Hgb A1c MFr Bld: 9.7 % of total Hgb — ABNORMAL HIGH (ref <5.7)
Mean Plasma Glucose: 232 mg/dL
eAG (mmol/L): 12.8 mmol/L

## 2021-09-18 ENCOUNTER — Other Ambulatory Visit: Payer: Self-pay | Admitting: Family Medicine

## 2021-09-18 MED ORDER — LANTUS SOLOSTAR 100 UNIT/ML ~~LOC~~ SOPN: 25.0000 [IU] | PEN_INJECTOR | Freq: Every day | SUBCUTANEOUS | 5 refills | Status: DC

## 2021-10-10 ENCOUNTER — Other Ambulatory Visit: Payer: Self-pay | Admitting: *Deleted

## 2021-10-10 MED ORDER — HYDROCODONE-ACETAMINOPHEN 5-325 MG PO TABS: 1.0000 | ORAL_TABLET | Freq: Four times a day (QID) | ORAL | 0 refills | Status: AC | PRN

## 2021-10-10 NOTE — Telephone Encounter (Signed)
Requested medication (s) are due for refill today - yes  Requested medication (s) are on the active medication list -yes  Future visit scheduled -yes  Last refill: 09/11/21 #60  Notes to clinic: non delegated Rx- patient reports PCP is going to fill for him  Requested Prescriptions  Pending Prescriptions Disp Refills   HYDROcodone-acetaminophen (NORCO/VICODIN) 5-325 MG tablet 60 tablet 0    Sig: Take 1 tablet by mouth every 6 (six) hours as needed for moderate pain (Must last 30 days.).     Not Delegated - Analgesics:  Opioid Agonist Combinations Failed - 10/10/2021 12:49 PM      Failed - This refill cannot be delegated      Failed - Urine Drug Screen completed in last 360 days      Failed - Valid encounter within last 3 months    Recent Outpatient Visits           1 year ago Primary hypertension   [place], Warren T, MD   2 years ago Type 2 diabetes mellitus without complication, with long-term current use of insulin ([place])   [place] [name], MD   3 years ago Diabetes mellitus type 2 with complications, uncontrolled ([place])   [place] [name], MD   3 years ago Diabetes mellitus type 2 with complications, uncontrolled ([place])   [place] [name], MD   4 years ago Diabetes mellitus type 2 with complications, uncontrolled ([place])   [place] [name], MD       Future Appointments             In 2 months [name], Cammie Mcgee, MD [facility]               Requested Prescriptions  Pending Prescriptions Disp Refills   HYDROcodone-acetaminophen (NORCO/VICODIN) 5-325 MG tablet 60 tablet 0    Sig: Take 1 tablet by mouth every 6 (six) hours as needed for moderate pain (Must last 30 days.).     Not Delegated - Analgesics:  Opioid Agonist Combinations Failed - 10/10/2021 12:49 PM      Failed - This refill cannot be  delegated      Failed - Urine Drug Screen completed in last 360 days      Failed - Valid encounter within last 3 months    Recent Outpatient Visits           1 year ago Primary hypertension   [place], Warren T, MD   2 years ago Type 2 diabetes mellitus without complication, with long-term current use of insulin ([place])   [place] [name], MD   3 years ago Diabetes mellitus type 2 with complications, uncontrolled ([place])   [place] [name], MD   3 years ago Diabetes mellitus type 2 with complications, uncontrolled ([place])   [place] [name], MD   4 years ago Diabetes mellitus type 2 with complications, uncontrolled ([place])   [place] [name], Cammie Mcgee, MD       Future Appointments             In 2 months [name], Cammie Mcgee, MD [place]

## 2021-10-10 NOTE — Telephone Encounter (Signed)
-----   Message from Edythe Clarity, [facility] sent at 10/10/2021 11:01 AM EDT ----- Patient called and stated Dr. [name] is taking over his Hydrocodone rx and was requesting a refill to be sent in to [facility] today if possible.  Thanks!  Beverly Milch, PharmD, CPP Clinical Pharmacist Practitioner [place] [phone]

## 2021-10-22 ENCOUNTER — Other Ambulatory Visit: Payer: Self-pay | Admitting: Family Medicine

## 2021-10-22 ENCOUNTER — Other Ambulatory Visit: Payer: Self-pay | Admitting: "Endocrinology

## 2021-11-05 ENCOUNTER — Ambulatory Visit (INDEPENDENT_AMBULATORY_CARE_PROVIDER_SITE_OTHER): Payer: PPO | Admitting: Family Medicine

## 2021-11-05 ENCOUNTER — Encounter: Payer: Self-pay | Admitting: Family Medicine

## 2021-11-05 VITALS — BP 120/60 | HR 114 | Ht 69.0 in | Wt 133.0 lb

## 2021-11-05 DIAGNOSIS — Z794 Long term (current) use of insulin: Secondary | ICD-10-CM | POA: Diagnosis not present

## 2021-11-05 DIAGNOSIS — Z0000 Encounter for general adult medical examination without abnormal findings: Secondary | ICD-10-CM

## 2021-11-05 DIAGNOSIS — E119 Type 2 diabetes mellitus without complications: Secondary | ICD-10-CM | POA: Diagnosis not present
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
## 2021-11-06 LAB — MICROALBUMIN, URINE: Microalb, Ur: 3.2 mg/dL

## 2021-11-11 ENCOUNTER — Telehealth: Payer: Self-pay

## 2021-11-11 NOTE — Telephone Encounter (Signed)
Pt's wife called stating pt is in need of a refill on his Hydrocodone 5/325. Last RF was 09/12/2021 per OV on 09/12/2021. Medication is not listed in his medication list. Thank you.

## 2021-11-12 ENCOUNTER — Other Ambulatory Visit: Payer: Self-pay | Admitting: Family Medicine

## 2021-11-12 DIAGNOSIS — T161XXA Foreign body in right ear, initial encounter: Secondary | ICD-10-CM | POA: Diagnosis not present

## 2021-11-12 MED ORDER — HYDROCODONE-ACETAMINOPHEN 5-325 MG PO TABS: 1.0000 | ORAL_TABLET | Freq: Four times a day (QID) | ORAL | 0 refills | Status: DC | PRN

## 2021-11-13 NOTE — Progress Notes (Signed)
Chronic Care Management Pharmacy Note  11/21/2021 Name:  [name] [name] MRN:  [id number] DOB:  05-07-1940  Summary: Glucose still elevated but improving.  Wife states he is in more pain now with less frequent Norco.  He does not want to be as active which is hard.  Taking OTC tylenol 65m for breakthrough when he needs it.  Counseled on avoiding too much Tylenol.  No changes to meds for DM - continue to shoot for A1c < 8.  FU 6 months  Subjective: [name] SOLTYSis an 81y.o. year old male who is a primary patient of [name] [name], [name] Mcgee MD.  The CCM team was consulted for assistance with disease management and care coordination needs.    Engaged with patient by telephone for follow up visit in response to provider referral for pharmacy case management and/or care coordination services.   Consent to Services:  The patient was given the following information about Chronic Care Management services today, agreed to services, and gave verbal consent: 1. CCM service includes personalized support from designated clinical staff supervised by the primary care provider, including individualized plan of care and coordination with other care providers 2. 24/7 contact phone numbers for assistance for urgent and routine care needs. 3. Service will only be billed when office clinical staff spend 20 minutes or more in a month to coordinate care. 4. Only one practitioner may furnish and bill the service in a calendar month. 5.The patient may stop CCM services at any time (effective at the end of the month) by phone call to the office staff. 6. The patient will be responsible for cost sharing (co-pay) of up to 20% of the service fee (after annual deductible is met). Patient agreed to services and consent obtained.  Patient Care Team: [name] Frizzle MD as PCP - General (Family Medicine) DEdythe [name] [facility] Pharmacist (Pharmacist) DEdythe [name] RO[name] Pharmacist (Pharmacist)  Recent office  visits:  None noted.    Recent consult visits:  None noted.    [name] visits:  None in previous 6 months  Objective:  Lab Results  Component Value Date   CREATININE 0.89 09/12/2021   BUN 12 09/12/2021   GFRNONAA 82 05/17/2020   GFRAA 95 05/17/2020   NA 140 09/12/2021   K 4.5 09/12/2021   CALCIUM 9.4 09/12/2021   CO2 27 09/12/2021   GLUCOSE 229 (H) 09/12/2021    Lab Results  Component Value Date/Time   HGBA1C 9.7 (H) 09/12/2021 11:12 AM   HGBA1C 12.1 (A) 11/05/2020 02:33 PM   HGBA1C 9.2 (H) 05/17/2020 11:41 AM   MICROALBUR 3.2 11/05/2021 03:50 PM   MICROALBUR CANCELED 09/12/2021 11:12 AM    Last diabetic Eye exam:  Lab Results  Component Value Date/Time   HMDIABEYEEXA No Retinopathy 11/04/2020 12:00 AM    Last diabetic Foot exam: No results found for: "HMDIABFOOTEX"   Lab Results  Component Value Date   CHOL 112 09/12/2021   HDL 54 09/12/2021   LDLCALC 38 09/12/2021   LDLDIRECT 109 (H) 04/07/2017   TRIG 122 09/12/2021   CHOLHDL 2.1 09/12/2021       Latest Ref Rng & Units 09/12/2021   11:12 AM 05/17/2020   11:41 AM 07/20/2019   12:41 PM  Hepatic Function  Total Protein 6.1 - 8.1 g/dL 6.3  6.5  6.8   AST 10 - 35 U/L _0 ALT 9 - 46 U/L 14  17  16  Total Bilirubin 0.2 - 1.2 mg/dL 0.4  0.4  0.4     Lab Results  Component Value Date/Time   TSH 2.45 11/07/2015 08:43 AM       Latest Ref Rng & Units 09/12/2021   11:12 AM 05/17/2020   11:41 AM 07/20/2019   12:41 PM  CBC  WBC 3.8 - 10.8 Thousand/uL 11.3  8.7  10.7   Hemoglobin 13.2 - 17.1 g/dL 14.1  12.9  13.9   Hematocrit 38.5 - 50.0 % 41.8  40.4  41.6   Platelets 140 - 400 Thousand/uL 234  244  222     Lab Results  Component Value Date/Time   VD25OH 58 05/17/2020 11:41 AM    Clinical ASCVD: No  The ASCVD Risk score (Arnett DK, et al., 2019) failed to calculate for the following reasons:   The 2019 ASCVD risk score is only valid for ages 44 to 47   The patient has a prior MI or stroke  diagnosis       11/05/2021    2:12 PM 09/12/2021   10:47 AM 06/28/2020    2:16 PM  Depression screen PHQ 2/9  Decreased Interest 0 0 0  Down, Depressed, Hopeless 2 0 0  PHQ - 2 Score 2 0 0  Altered sleeping 2  0  Tired, decreased energy 1  0  Change in appetite 0  0  Feeling bad or failure about yourself  0  0  Trouble concentrating 3  0  Moving slowly or fidgety/restless 3  0  Suicidal thoughts 0  0  PHQ-9 Score 11  0  Difficult doing work/chores Somewhat difficult  Not difficult at all     Social History   Tobacco Use  Smoking Status Every Day   Packs/day: 1.00   Years: 60.00   Total pack years: 60.00   Types: Cigarettes  Smokeless Tobacco Never   BP Readings from Last 3 Encounters:  11/05/21 120/60  09/12/21 112/62  08/19/21 (!) 153/83   Pulse Readings from Last 3 Encounters:  11/05/21 (!) 114  09/12/21 87  08/19/21 (!) 122   Wt Readings from Last 3 Encounters:  11/05/21 133 lb (60.3 kg)  09/12/21 130 lb 9.6 oz (59.2 kg)  08/19/21 129 lb (58.5 kg)   BMI Readings from Last 3 Encounters:  11/05/21 19.64 kg/m  09/12/21 19.29 kg/m  08/19/21 19.05 kg/m    Assessment/Interventions: Review of patient past medical history, allergies, medications, health status, including review of consultants reports, laboratory and other test data, was performed as part of comprehensive evaluation and provision of chronic care management services.   SDOH:  (Social Determinants of Health) assessments and interventions performed: No, done within the year Financial Resource Strain: Low Risk  (11/05/2021)   Overall Financial Resource Strain (CARDIA)    Difficulty of Paying Living Expenses: Not hard at all   Food Insecurity: No Food Insecurity (11/05/2021)   Hunger Vital Sign    Worried About Running Out of Food in the Last Year: Never true    Ran Out of Food in the Last Year: Never true    SDOH Interventions    Flowsheet Row Clinical Support from 11/05/2021 in [place] from 06/28/2020 in [place] Interventions    Food Insecurity Interventions Intervention Not Indicated Intervention Not Indicated  Housing Interventions Intervention Not Indicated Intervention Not Indicated  Transportation Interventions Intervention Not Indicated Intervention Not Indicated  Utilities Interventions Intervention Not Indicated --  Alcohol Usage Interventions Intervention Not Indicated (Score <7) --  Financial Strain Interventions Intervention Not Indicated Intervention Not Indicated  Physical Activity Interventions Intervention Not Indicated Intervention Not Indicated  Stress Interventions Intervention Not Indicated Intervention Not Indicated  Social Connections Interventions Intervention Not Indicated Intervention Not Indicated      SDOH Screenings   Food Insecurity: No Food Insecurity (11/05/2021)  Housing: Low Risk  (11/05/2021)  Transportation Needs: No Transportation Needs (11/05/2021)  Utilities: Not At Risk (11/05/2021)  Alcohol Screen: Low Risk  (11/05/2021)  Depression (PHQ2-9): High Risk (11/05/2021)  Financial Resource Strain: Low Risk  (11/05/2021)  Physical Activity: Insufficiently Active (11/05/2021)  Social Connections: Moderately Integrated (11/05/2021)  Stress: No Stress Concern Present (11/05/2021)  Tobacco Use: High Risk (11/05/2021)    CCM Care Plan  Allergies  Allergen Reactions   Invokana [Canagliflozin] Palpitations, Rash and Other (See Comments)    Rash, tachycardia,weight loss    Plavix [Clopidogrel Bisulfate]     Upset stomach    Medications Reviewed Today     Reviewed by [name] [name], [facility] [name] (Pharmacist) on 11/21/21 at 1016  Med List Status: <None>   Medication Order Taking? Sig Documenting Provider Last Dose Status Informant  B-D ULTRAFINE III SHORT PEN 31G X 8 MM MISC [id number] Yes USE TO INJECT INSULIN EVERY DAY [name], MD Taking Active   Blood Glucose  Monitoring Suppl [facility] VERIO) w/Device Drucie Opitz [id number] Yes USE TO TEST BLOOD SUGAR LEVELS THREE TIMES DAILY Orlena Sheldon, PA-C Taking Active Spouse/Significant Other  Cholecalciferol (DIALYVITE VITAMIN D 5000) 125 MCG (5000 UT) capsule [id number] Yes Take 5,000 Units by mouth daily. [provider] Taking Active   donepezil (ARICEPT) 10 MG tablet [id number] Yes TAKE 1 TABLET(10 MG) BY MOUTH AT BEDTIME [name], MD Taking Active   HYDROcodone-acetaminophen [facility]) 5-325 MG tablet [id number] Yes Take 1 tablet by mouth every 6 (six) hours as needed for moderate pain. [name], MD Taking Active   insulin glargine (LANTUS SOLOSTAR) 100 UNIT/ML Solostar Pen [id number] Yes Inject 25 Units into the skin daily. [name], MD Taking Active   Lancets (ONETOUCH DELICA PLUS PZWCHE52D) [place] [id number] Yes USE TO CHECK BLOOD SUGAR TWICE DAILY. Dx: E11.9. [name], MD Taking Active   losartan (COZAAR) 50 MG tablet [id number] Yes TAKE 1 TABLET BY MOUTH EVERY NIGHT AT BEDTIME [name], MD Taking Active   memantine (NAMENDA) 10 MG tablet [id number] Yes TAKE 1 TABLET(10 MG) BY MOUTH TWICE DAILY [name], MD Taking Active   metFORMIN (GLUCOPHAGE) 1000 MG tablet [id number] Yes TAKE 1 TABLET(1000 MG) BY MOUTH TWICE DAILY WITH A MEAL [name], MD Taking Active   [facility] VERIO test strip [id number] Yes USE TO TEST BLOOD SUGAR 2 TO 3 TIMES DAILY [name], MD Taking Active   pioglitazone (ACTOS) 45 MG tablet [id number] Yes TAKE 1 TABLET(45 MG) BY MOUTH AT BEDTIME [name], MD Taking Active   rosuvastatin (CRESTOR) 40 MG tablet [id number] Yes TAKE 1 TABLET(40 MG) BY MOUTH DAILY [name], MD Taking Active             Patient Active Problem List   Diagnosis Date Noted   Vitamin D deficiency 05/17/2020   Cerebrovascular accident (CVA) ([place]) 05/17/2020   Carotid stenosis 06/15/2016   Carotid disease, bilateral ([place]) 04/12/2015    History of colonic polyps    Iron deficiency anemia 12/21/2013   Smoker 12/21/2013   Anxiety    Diabetes mellitus (  [place])    Diabetes mellitus type 2, uncomplicated ([place])    Hyperlipemia    Hypertension    Cellulitis of foot, left 08/09/2010    Immunization History  Administered Date(s) Administered   Influenza, High Dose Seasonal PF 10/22/2016, 01/24/2018   Influenza,inj,Quad PF,6+ Mos 10/22/2014, 11/07/2015   Influenza-Unspecified 09/12/2013   Pneumococcal Conjugate-13 11/07/2015   Pneumococcal Polysaccharide-23 06/26/2014   Tdap 03/09/2016    Conditions to be addressed/monitored:  HTN, HLD, DM  Care Plan : General Pharmacy (Adult)  Updates made by [name] [name], RPH since 11/21/2021 12:00 AM     Problem: HTN, DM   Priority: High  Onset Date: 02/23/2020     Long-Range Goal: Patient-Specific Goal   Start Date: 02/23/2020  Expected End Date: 08/22/2020  Recent Progress: On track  Priority: High  Note:    Current Barriers:  Pain, elevaed A1c   Pharmacist Clinical Goal(s):  Over the next 90 days, patient will maintain control of blood glucose and blood pressure as evidenced by A1c, home monitoring  through collaboration with PharmD and provider.    Interventions: 1:1 collaboration with [name], MD regarding development and update of comprehensive plan of care as evidenced by provider attestation and co-signature Inter-disciplinary care team collaboration (see longitudinal plan of care) Comprehensive medication review performed; medication list updated in electronic medical record  Hypertension (BP goal <130/80) 11/21/21 -controlled -Current treatment: Losartan 79m Appropriate, Effective, Safe, Accessible -Medications previously tried: none noted  -Current home readings: doesn't check regularly -Current dietary habits: likes to snack, wife describes him as having sweet tooth. -Current exercise habits: not walking as much due to increased pain and  fatigue -Denies hypotensive/hypertensive symptoms -BP has been well controlled, denies any dizziness. --Office BP well controlled, no changes needed at this time.   Update 06/26/20 BP has been controlled they are still checking daily. Denies any symptoms Continue current medication  Update 10/03/20 No issues with BP, they continue to monitor No dizziness or HA's Continue current meds for now  Update 02/13/21 BP has been controlled, no logs at home however. Denies any dizziness or HA. BP on lower end in office recently. IF he does develop any dizziness, etc would recommend dose decrease of losartan to 263mdaily. Will FU after visit to endocrine.  Update 05/15/21 BP doing well, denies any dizziness at home. Recommend recheck in office at physical. No changes to medications at this time.  Diabetes (A1c goal <8%) 11/20/21 -Uncontrolled, last A1c was 9.7 -Current medications: Lantus 25 units AM Appropriate, Query effective, Pioglitazone 4519maily Appropriate, Query effective, Metformin 1000m8mice daily with a meal Appropriate, Query effective, -Medications previously tried: Trulicity  -Current home glucose readings - sugars are improving per his wife with home readings.  A1c has improved.  He has FU visit scheduled for December for recheck -Denies hypoglycemic/hyperglycemic symptoms -Current meal patterns:  About the same as previous -Current exercise: minimal, loves to walk outside when weather is nice -Educated onA1c and blood sugar goals; -They have stopped seeing endocrine as the patient did not want to go back.  Actos now level at 45mg36mly, Lantus now 25 units.  Glucose is improving, denies any low glucose besides one episode soon after increased dose of basal. No changes at this time, continue to monitor and update.  Will FU after upcoming A1c check. DUE for foot exam and diabetic kidney eval - will coordinate.  Update 06/26/20 Sugar Average: 7 day -196; 14 days -  197;  90 days -  179 Does report a reading of 48 on 06/24/20 and two days prior sugar was 438. Continues to fluctuate and reports he is eating the same thing daily.  Some days he is more active than others. They still have not gotten in touch with Endocrinologist - will consult with PCP on correcting referral to the [place]. Patient may benefit from starting back on Trulicity with an empiric reduction in long acting insulin.  They have medicaid now so affordability should not be an issue. Will consult with PCP on this and also continue referral to Endocrinology.  Update 10/03/20 Sugars have been ranging from around 180-280 since patient fell and fractured his pelvis.  Wife is having a hard time getting him to agree to take his Lantus which has led to the elevated sugars.  They also never heard from [place] about scheduling a visit.  As long as sugars do not shoot up into the 300-400 again patient is OK to recover at home.  Encouraged to give Lantus as much as he will allow.  Will follow up on Endo referral. No changes to meds at this time  Update 02/13/21 He is not followed by Endocrine for uncontrolled diabetes. A1c is now > 12.0 but he has not had visit with Endocrine since changing meds around.  Wife reports sugars are lower but still majority of them are > 200.  Patient with dementia and getting him to follow diet his hard because he likes to have cookies etc. Actos recently deceased to 55m daily. He is now more compliant with insulin.  Patient may benefit from SGLT-2 inhibitor or GLP-1 if A1c remains uncontrolled on current regimen. Has upcoming visit with Endocrine, will fu on A1c and make recommendations if needed after this visit.  Update 05/15/21 Wife reports glucose is still running > 200 for the most part.  Denies any hypoglycemia.  Still having difficulty getting him to take all of his doses of Lantus due to fighting her to give it.  I do think he would  benefit from resuming Trulicity now that they have medicaid and cost will not be an issue. Depending on what A1c is we would need to reduce his Lantus.   Consider starting Trulicity to replace Actos. This would help with compliance with once weekly dosing.  Patient Goals/Self-Care Activities Over the next 90 days, patient will:  - take medications as prescribed check glucose daily, document, and provide at future appointments check blood pressure periodically, document, and provide at future appointments  Follow Up Plan: The care management team will reach out to the patient again over the next 180 days.              Medication Assistance: None required.  Patient affirms current coverage meets needs.  Compliance/Adherence/Medication fill history: Care Gaps: Needs foot exam, kidney Eval  Med Adherence: Metformin 10068m09/24/23 90ds Rosuvastatin 08/1821 90ds  [id number] [place] [id number]: [phone]: [phone] Uses pill box? Yes Pt endorses 100% compliance  We discussed: Benefits of medication synchronization, packaging and delivery as well as enhanced pharmacist oversight with [employer]. Patient decided to: Continue current medication management strategy  Care Plan and Follow Up Patient Decision:  Patient agrees to Care Plan and Follow-up.  Plan: The care management team will reach out to the patient again over  the next 90 days.  Beverly Milch, PharmD Clinical Pharmacist [place] [phone]

## 2021-11-19 ENCOUNTER — Ambulatory Visit: Payer: PPO | Admitting: Orthopaedic Surgery

## 2021-11-20 ENCOUNTER — Ambulatory Visit: Payer: PPO | Admitting: Pharmacist

## 2021-11-20 DIAGNOSIS — E119 Type 2 diabetes mellitus without complications: Secondary | ICD-10-CM

## 2021-11-20 DIAGNOSIS — I10 Essential (primary) hypertension: Secondary | ICD-10-CM

## 2021-11-21 NOTE — Patient Instructions (Addendum)
Visit Information   Goals Addressed             This Visit's Progress    Monitor and Manage My Blood Sugar-Diabetes Type 2   On track    Timeframe:  Long-Range Goal Priority:  High Start Date:    02/23/20                         Expected End Date:   02/23/20                    Follow Up Date 08/10/20   - check blood sugar at prescribed times - enter blood sugar readings and medication or insulin into daily log - take the blood sugar log to all doctor visits    Why is this important?   Checking your blood sugar at home helps to keep it from getting very high or very low.  Writing the results in a diary or log helps the doctor know how to care for you.  Your blood sugar log should have the time, date and the results.  Also, write down the amount of insulin or other medicine that you take.  Other information, like what you ate, exercise done and how you were feeling, will also be helpful.     Notes: Contact providers with any hypoglycemia < 70  06/26/20 - BG still fluctuating, setting up with Endo, consider Trulicity       Patient Care Plan: General Pharmacy (Adult)     Problem Identified: HTN, DM   Priority: High  Onset Date: 02/23/2020     Long-Range Goal: Patient-Specific Goal   Start Date: 02/23/2020  Expected End Date: 08/22/2020  Recent Progress: On track  Priority: High  Note:    Current Barriers:  Pain, elevaed A1c   Pharmacist Clinical Goal(s):  Over the next 90 days, patient will maintain control of blood glucose and blood pressure as evidenced by A1c, home monitoring  through collaboration with PharmD and provider.    Interventions: 1:1 collaboration with [name], MD regarding development and update of comprehensive plan of care as evidenced by provider attestation and co-signature Inter-disciplinary care team collaboration (see longitudinal plan of care) Comprehensive medication review performed; medication list updated in electronic  medical record  Hypertension (BP goal <130/80) 11/21/21 -controlled -Current treatment: Losartan '50mg'$  Appropriate, Effective, Safe, Accessible -Medications previously tried: none noted  -Current home readings: doesn't check regularly -Current dietary habits: likes to snack, wife describes him as having sweet tooth. -Current exercise habits: not walking as much due to increased pain and fatigue -Denies hypotensive/hypertensive symptoms -BP has been well controlled, denies any dizziness. --Office BP well controlled, no changes needed at this time.   Update 06/26/20 BP has been controlled they are still checking daily. Denies any symptoms Continue current medication  Update 10/03/20 No issues with BP, they continue to monitor No dizziness or HA's Continue current meds for now  Update 02/13/21 BP has been controlled, no logs at home however. Denies any dizziness or HA. BP on lower end in office recently. IF he does develop any dizziness, etc would recommend dose decrease of losartan to '25mg'$  daily. Will FU after visit to endocrine.  Update 05/15/21 BP doing well, denies any dizziness at home. Recommend recheck in office at physical. No changes to medications at this time.  Diabetes (A1c goal <8%) 11/20/21 -Uncontrolled, last A1c was 9.7 -Current medications: Lantus 25 units AM Appropriate, Query effective, Pioglitazone  $'45mg'O$  daily Appropriate, Query effective, Metformin '1000mg'$  twice daily with a meal Appropriate, Query effective, -Medications previously tried: Trulicity  -Current home glucose readings - sugars are improving per his wife with home readings.  A1c has improved.  He has FU visit scheduled for December for recheck -Denies hypoglycemic/hyperglycemic symptoms -Current meal patterns:  About the same as previous -Current exercise: minimal, loves to walk outside when weather is nice -Educated onA1c and blood sugar goals; -They have stopped seeing endocrine as the  patient did not want to go back.  Actos now level at '45mg'$  daily, Lantus now 25 units.  Glucose is improving, denies any low glucose besides one episode soon after increased dose of basal. No changes at this time, continue to monitor and update.  Will FU after upcoming A1c check. DUE for foot exam and diabetic kidney eval - will coordinate.  Update 06/26/20 Sugar Average: 7 day -196; 14 days - 197;  90 days - 179 Does report a reading of 48 on 06/24/20 and two days prior sugar was 438. Continues to fluctuate and reports he is eating the same thing daily.  Some days he is more active than others. They still have not gotten in touch with Endocrinologist - will consult with PCP on correcting referral to the [place]. Patient may benefit from starting back on Trulicity with an empiric reduction in long acting insulin.  They have medicaid now so affordability should not be an issue. Will consult with PCP on this and also continue referral to Endocrinology.  Update 10/03/20 Sugars have been ranging from around 180-280 since patient fell and fractured his pelvis.  Wife is having a hard time getting him to agree to take his Lantus which has led to the elevated sugars.  They also never heard from [place] about scheduling a visit.  As long as sugars do not shoot up into the 300-400 again patient is OK to recover at home.  Encouraged to give Lantus as much as he will allow.  Will follow up on Endo referral. No changes to meds at this time  Update 02/13/21 He is not followed by Endocrine for uncontrolled diabetes. A1c is now > 12.0 but he has not had visit with Endocrine since changing meds around.  Wife reports sugars are lower but still majority of them are > 200.  Patient with dementia and getting him to follow diet his hard because he likes to have cookies etc. Actos recently deceased to '30mg'$  daily. He is now more compliant with insulin.  Patient may benefit from SGLT-2 inhibitor  or GLP-1 if A1c remains uncontrolled on current regimen. Has upcoming visit with Endocrine, will fu on A1c and make recommendations if needed after this visit.  Update 05/15/21 Wife reports glucose is still running > 200 for the most part.  Denies any hypoglycemia.  Still having difficulty getting him to take all of his doses of Lantus due to fighting her to give it.  I do think he would benefit from resuming Trulicity now that they have medicaid and cost will not be an issue. Depending on what A1c is we would need to reduce his Lantus.   Consider starting Trulicity to replace Actos. This would help with compliance with once weekly dosing.  Patient Goals/Self-Care Activities Over the next 90 days, patient will:  - take medications as prescribed check glucose daily, document, and provide at future appointments check blood pressure periodically, document, and provide at future appointments  Follow Up Plan: The care  management team will reach out to the patient again over the next 180 days.           The patient verbalized understanding of instructions, educational materials, and care plan provided today and DECLINED offer to receive copy of patient instructions, educational materials, and care plan.  Telephone follow up appointment with pharmacy team member scheduled for: 6 months  Edythe Clarity, [place], PharmD, [place] Clinical Pharmacist Practitioner [place] [phone]

## 2021-12-11 ENCOUNTER — Telehealth: Payer: Self-pay

## 2021-12-11 ENCOUNTER — Other Ambulatory Visit: Payer: Self-pay | Admitting: Family Medicine

## 2021-12-11 MED ORDER — HYDROCODONE-ACETAMINOPHEN 5-325 MG PO TABS: 1.0000 | ORAL_TABLET | Freq: Four times a day (QID) | ORAL | 0 refills | Status: DC | PRN

## 2021-12-11 NOTE — Telephone Encounter (Signed)
Pt's wife, [name], has called stating pt will be out of his Hydrocodone tonight. Last RF was 11/12/2021.  Can enough medication be sent in until Dr. [name] is back next week? Thank you.

## 2021-12-11 NOTE — Progress Notes (Signed)
PDMP reveiwed

## 2021-12-18 ENCOUNTER — Ambulatory Visit: Payer: PPO | Admitting: Family Medicine

## 2021-12-26 ENCOUNTER — Other Ambulatory Visit: Payer: Self-pay

## 2021-12-26 DIAGNOSIS — E1165 Type 2 diabetes mellitus with hyperglycemia: Secondary | ICD-10-CM

## 2021-12-26 MED ORDER — HYDROCODONE-ACETAMINOPHEN 5-325 MG PO TABS: 1.0000 | ORAL_TABLET | Freq: Four times a day (QID) | ORAL | 0 refills | Status: DC | PRN

## 2021-12-29 ENCOUNTER — Other Ambulatory Visit: Payer: PPO

## 2021-12-29 DIAGNOSIS — E1165 Type 2 diabetes mellitus with hyperglycemia: Secondary | ICD-10-CM

## 2021-12-30 LAB — HEMOGLOBIN A1C
Hgb A1c MFr Bld: 10.6 % of total Hgb — ABNORMAL HIGH (ref <5.7)
Mean Plasma Glucose: 258 mg/dL
eAG (mmol/L): 14.3 mmol/L

## 2022-01-08 ENCOUNTER — Telehealth: Payer: Self-pay | Admitting: Family Medicine

## 2022-01-08 NOTE — Telephone Encounter (Signed)
Prescription Request  01/08/2022  Is this a "Controlled Substance" medicine? Yes  LOV: 09/12/2021  What is the name of the medication or equipment? HYDROcodone-acetaminophen ([name]) 5-325 MG table   Have you contacted your pharmacy to request a refill? No   Which pharmacy would you like this sent to?  [id number] - [place], [name] [place] [address] Phone: [phone] Fax: [phone]    Patient notified that their request is being sent to the clinical staff for review and that they should receive a response within 2 business days.   Please advise at [facility] [phone]

## 2022-01-09 ENCOUNTER — Other Ambulatory Visit: Payer: Self-pay | Admitting: Family Medicine

## 2022-01-09 MED ORDER — HYDROCODONE-ACETAMINOPHEN 5-325 MG PO TABS: 1.0000 | ORAL_TABLET | Freq: Four times a day (QID) | ORAL | 0 refills | Status: AC | PRN

## 2022-01-09 NOTE — Telephone Encounter (Signed)
Call pt's wife, advice rx has been sent to pharmacy.   Nothing further.

## 2022-01-09 NOTE — Telephone Encounter (Signed)
Called pt and spoke w/wife, [name] [name], per wife the previous refill was only for 15 pills. Per wife pt did not get his full Rx. Per wife, pt supposed to get DS#60,pt takes 2/day  Pls advice?

## 2022-01-13 ENCOUNTER — Other Ambulatory Visit: Payer: Self-pay | Admitting: Family Medicine

## 2022-01-14 ENCOUNTER — Other Ambulatory Visit: Payer: Self-pay | Admitting: Family Medicine

## 2022-02-09 ENCOUNTER — Telehealth: Payer: Self-pay

## 2022-02-09 NOTE — Telephone Encounter (Signed)
Pt's wife called in to request a refill of this med for pt. (HYDROcodone-acetaminophen (NORCO) 5-325 MG tablet [[id number]]  ENDED).  LOV: 12/18/21  CB#: [phone]  PHARMACY: [id number] - [place], [place] [name]

## 2022-02-10 ENCOUNTER — Other Ambulatory Visit: Payer: Self-pay | Admitting: Family Medicine

## 2022-02-10 MED ORDER — HYDROCODONE-ACETAMINOPHEN 5-325 MG PO TABS: 1.0000 | ORAL_TABLET | Freq: Four times a day (QID) | ORAL | 0 refills | Status: DC | PRN

## 2022-02-21 ENCOUNTER — Other Ambulatory Visit: Payer: Self-pay | Admitting: Family Medicine

## 2022-02-23 ENCOUNTER — Other Ambulatory Visit: Payer: Self-pay | Admitting: Family Medicine

## 2022-03-20 ENCOUNTER — Telehealth: Payer: Self-pay | Admitting: Family Medicine

## 2022-03-20 NOTE — Telephone Encounter (Signed)
Prescription Request  03/20/2022  LOV: 09/12/2021  What is the name of the medication or equipment?   HYDROcodone-acetaminophen (NORCO) 5-325 MG tablet  **PATIENT HAS BEEN OUT OF MEDICATION SINCE TUESDAY 03/18/22** **REQUESTING FOR REFILL TO BE SENT TODAY SO HE CAN HAVE HIS MEDICATION TONIGHT**  Have you contacted your pharmacy to request a refill? Yes   Which pharmacy would you like this sent to?  [id number] - [place], [name] [place] [address] Phone: [phone] Fax: [phone]    Patient notified that their request is being sent to the clinical staff for review and that they should receive a response within 2 business days.   Please advise patient when script sent in at [phone].

## 2022-03-24 ENCOUNTER — Ambulatory Visit (INDEPENDENT_AMBULATORY_CARE_PROVIDER_SITE_OTHER): Payer: PPO | Admitting: Family Medicine

## 2022-03-24 ENCOUNTER — Encounter: Payer: Self-pay | Admitting: Family Medicine

## 2022-03-24 VITALS — BP 120/62 | HR 96 | Temp 98.4°F | Ht 69.0 in | Wt 135.0 lb

## 2022-03-24 DIAGNOSIS — E785 Hyperlipidemia, unspecified: Secondary | ICD-10-CM

## 2022-03-24 DIAGNOSIS — E119 Type 2 diabetes mellitus without complications: Secondary | ICD-10-CM | POA: Diagnosis not present

## 2022-03-24 DIAGNOSIS — I10 Essential (primary) hypertension: Secondary | ICD-10-CM | POA: Diagnosis not present

## 2022-03-24 DIAGNOSIS — E118 Type 2 diabetes mellitus with unspecified complications: Secondary | ICD-10-CM | POA: Diagnosis not present

## 2022-03-24 DIAGNOSIS — I639 Cerebral infarction, unspecified: Secondary | ICD-10-CM

## 2022-03-24 DIAGNOSIS — Z794 Long term (current) use of insulin: Secondary | ICD-10-CM | POA: Diagnosis not present

## 2022-03-24 DIAGNOSIS — I6930 Unspecified sequelae of cerebral infarction: Secondary | ICD-10-CM | POA: Diagnosis not present
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
## 2022-03-25 LAB — CBC WITH DIFFERENTIAL/PLATELET
Absolute Monocytes: 697 cells/uL (ref 200–950)
Basophils Absolute: 74 cells/uL (ref 0–200)
Basophils Relative: 0.9 %
Eosinophils Absolute: 107 cells/uL (ref 15–500)
Eosinophils Relative: 1.3 %
HCT: 40 % (ref 38.5–50.0)
Hemoglobin: 13.4 g/dL (ref 13.2–17.1)
Lymphs Abs: 1976 cells/uL (ref 850–3900)
MCH: 31.3 pg (ref 27.0–33.0)
MCHC: 33.5 g/dL (ref 32.0–36.0)
MCV: 93.5 fL (ref 80.0–100.0)
MPV: 10.6 fL (ref 7.5–12.5)
Monocytes Relative: 8.5 %
Neutro Abs: 5346 cells/uL (ref 1500–7800)
Neutrophils Relative %: 65.2 %
Platelets: 302 10*3/uL (ref 140–400)
RBC: 4.28 10*6/uL (ref 4.20–5.80)
RDW: 12.8 % (ref 11.0–15.0)
Total Lymphocyte: 24.1 %
WBC: 8.2 10*3/uL (ref 3.8–10.8)

## 2022-03-25 LAB — LIPID PANEL
Cholesterol: 176 mg/dL (ref <200)
HDL: 51 mg/dL (ref >=40)
LDL Cholesterol (Calc): 96 mg/dL (calc)
Non-HDL Cholesterol (Calc): 125 mg/dL (calc) (ref <130)
Total CHOL/HDL Ratio: 3.5 (calc) (ref <5.0)
Triglycerides: 196 mg/dL — ABNORMAL HIGH (ref <150)

## 2022-03-25 LAB — COMPLETE METABOLIC PANEL WITH GFR
AG Ratio: 1.4 (calc) (ref 1.0–2.5)
ALT: 15 U/L (ref 9–46)
AST: 14 U/L (ref 10–35)
Albumin: 3.7 g/dL (ref 3.6–5.1)
Alkaline phosphatase (APISO): 66 U/L (ref 35–144)
BUN: 12 mg/dL (ref 7–25)
CO2: 28 mmol/L (ref 20–32)
Calcium: 9.8 mg/dL (ref 8.6–10.3)
Chloride: 97 mmol/L — ABNORMAL LOW (ref 98–110)
Creat: 0.85 mg/dL (ref 0.70–1.22)
Globulin: 2.6 g/dL (calc) (ref 1.9–3.7)
Glucose, Bld: 375 mg/dL — ABNORMAL HIGH (ref 65–99)
Potassium: 3.9 mmol/L (ref 3.5–5.3)
Sodium: 135 mmol/L (ref 135–146)
Total Bilirubin: 0.4 mg/dL (ref 0.2–1.2)
Total Protein: 6.3 g/dL (ref 6.1–8.1)
eGFR: 88 mL/min/{1.73_m2} (ref >=60)

## 2022-03-25 LAB — HEMOGLOBIN A1C
Hgb A1c MFr Bld: 10.4 % of total Hgb — ABNORMAL HIGH (ref <5.7)
Mean Plasma Glucose: 252 mg/dL
eAG (mmol/L): 13.9 mmol/L

## 2022-03-27 ENCOUNTER — Telehealth: Payer: Self-pay

## 2022-03-27 ENCOUNTER — Other Ambulatory Visit: Payer: Self-pay | Admitting: Family Medicine

## 2022-03-27 DIAGNOSIS — E118 Type 2 diabetes mellitus with unspecified complications: Secondary | ICD-10-CM

## 2022-03-27 MED ORDER — HYDROCODONE-ACETAMINOPHEN 5-325 MG PO TABS: 1.0000 | ORAL_TABLET | Freq: Four times a day (QID) | ORAL | 0 refills | Status: DC | PRN

## 2022-03-27 NOTE — Telephone Encounter (Signed)
Pt's wife, [name], on [place], called and advised regarding pt's labs and need for endocrinology referral. Pt's wife is good with referral.  [name] asks if it is possible for pt to have his pain medication refilled?   Norco 5/325, last RF was 02/10/2022. Thank you.

## 2022-04-02 ENCOUNTER — Ambulatory Visit: Payer: PPO | Admitting: Family Medicine

## 2022-04-03 ENCOUNTER — Telehealth: Payer: Self-pay | Admitting: Pharmacist

## 2022-04-03 NOTE — Progress Notes (Signed)
Care Management & Coordination Services Pharmacy Team  Reason for Encounter: Diabetes  Contacted patient to discuss diabetes disease state. Unsuccessful outreach. Left voicemail for patient to return call.  Current antihyperglycemic regimen:  pioglitazone 45 MG tablet  Metformin 1000MG  tablet     Patient verbally confirms he is taking the above medications as directed.   What diet changes have been made to improve diabetes control? Patient reported  What recent interventions/DTPs have been made to improve glycemic control:  Patient reported   Have there been any recent hospitalizations or ED visits since last visit with PharmD?   Patient  hypoglycemic symptoms, including   Patient  hyperglycemic symptoms, including   How often are you checking your blood sugar?   What are your blood sugars ranging?  Fasting:  Before meals:  After meals:  Bedtime:   During the week, how often does your blood glucose drop below 70?   Are you checking your feet daily/regularly?   Adherence Review: Is the patient currently on a STATIN medication? Yes Is the patient currently on ACE/ARB medication? Yes Does the patient have >5 day gap between last estimated fill dates? Yes   Chart Updates:  Recent office visits:  03/24/22 Jenna Luo, MD - Family Medicine - Diabetes - Labs were ordered. No medication changes. Follow up as scheduled.   Recent consult visits:  None noted.   Hospital visits:  None in previous 6 months  Medications: Outpatient Encounter Medications as of 04/03/2022  Medication Sig   B-D ULTRAFINE III SHORT PEN 31G X 8 MM MISC USE TO INJECT INSULIN EVERY DAY   Blood Glucose Monitoring Suppl (ONETOUCH VERIO) w/Device KIT USE TO TEST BLOOD SUGAR LEVELS THREE TIMES DAILY   Cholecalciferol (DIALYVITE VITAMIN D 5000) 125 MCG (5000 UT) capsule Take 5,000 Units by mouth daily.   donepezil (ARICEPT) 10 MG tablet TAKE 1 TABLET(10 MG) BY MOUTH AT BEDTIME    HYDROcodone-acetaminophen (NORCO) 5-325 MG tablet Take 1 tablet by mouth every 6 (six) hours as needed for moderate pain.   insulin glargine (LANTUS SOLOSTAR) 100 UNIT/ML Solostar Pen Inject 25 Units into the skin daily.   Lancets (ONETOUCH DELICA PLUS 123XX123) MISC USE TO CHECK BLOOD SUGAR TWICE DAILY. Dx: E11.9.   losartan (COZAAR) 50 MG tablet TAKE 1 TABLET BY MOUTH EVERY NIGHT AT BEDTIME   memantine (NAMENDA) 10 MG tablet TAKE 1 TABLET(10 MG) BY MOUTH TWICE DAILY   metFORMIN (GLUCOPHAGE) 1000 MG tablet TAKE 1 TABLET(1000 MG) BY MOUTH TWICE DAILY WITH A MEAL   ONETOUCH VERIO test strip USE TO TEST BLOOD SUGAR TWO TO THREE TIMES DAILY   pioglitazone (ACTOS) 45 MG tablet TAKE 1 TABLET(45 MG) BY MOUTH AT BEDTIME   rosuvastatin (CRESTOR) 40 MG tablet TAKE 1 TABLET(40 MG) BY MOUTH DAILY   No facility-administered encounter medications on file as of 04/03/2022.    Recent Relevant Labs: Lab Results  Component Value Date/Time   HGBA1C 10.4 (H) 03/24/2022 02:53 PM   HGBA1C 10.6 (H) 12/29/2021 11:07 AM   HGBA1C 12.1 (A) 11/05/2020 02:33 PM   MICROALBUR 3.2 11/05/2021 03:50 PM   MICROALBUR CANCELED 09/12/2021 11:12 AM    Kidney Function Lab Results  Component Value Date/Time   CREATININE 0.85 03/24/2022 02:53 PM   CREATININE 0.89 09/12/2021 11:12 AM   GFRNONAA 82 05/17/2020 11:41 AM   GFRAA 95 05/17/2020 11:41 AM    Star Rating Drugs:  Medication:    Last Fill: Day Supply  rosuvastatin 40 MG tablet   02/23/22  60 pioglitazone 45 MG tablet   02/08/22 90 losartan 50 MG tablet   07/23/21 90 Metformin 1000MG  tablet  01/14/22  60   Care Gaps: Annual wellness visit in last year? Yes done 11/05/21 Last eye exam / retinopathy screening: overdue Last diabetic foot exam: overdue   Future Appointments  Date Time Provider [place]  05/19/2022  9:30 AM Brita Romp, NP [facility] None  06/25/2022 11:15 AM [name], Cammie Mcgee, MD [facility] [place],  [place]

## 2022-04-14 ENCOUNTER — Other Ambulatory Visit: Payer: Self-pay | Admitting: Family Medicine

## 2022-04-15 NOTE — Telephone Encounter (Signed)
Unable to refill per protocol, Rx request is too soon. Last refill 04/14/22 for 60 days.  Requested Prescriptions  Pending Prescriptions Disp Refills   metFORMIN (GLUCOPHAGE) 1000 MG tablet [Pharmacy Med Name: METFORMIN 1000MG  TABLETS] 180 tablet     Sig: TAKE 1 TABLET(1000 MG) BY MOUTH TWICE DAILY WITH A MEAL     Endocrinology:  Diabetes - Biguanides Failed - 04/14/2022  6:27 PM      Failed - HBA1C is between 0 and 7.9 and within 180 days    HbA1c, POC (controlled diabetic range)  Date Value Ref Range Status  11/05/2020 12.1 (A) 0.0 - 7.0 % Final   Hgb A1c MFr Bld  Date Value Ref Range Status  03/24/2022 10.4 (H) <5.7 % of total Hgb Final    Comment:    For someone without known diabetes, a hemoglobin A1c value of 6.5% or greater indicates that they may have  diabetes and this should be confirmed with a follow-up  test. . For someone with known diabetes, a value <7% indicates  that their diabetes is well controlled and a value  greater than or equal to 7% indicates suboptimal  control. A1c targets should be individualized based on  duration of diabetes, age, comorbid conditions, and  other considerations. . Currently, no consensus exists regarding use of hemoglobin A1c for diagnosis of diabetes for children. .          Failed - B12 Level in normal range and within 720 days    No results found for: "VITAMINB12"       Failed - Valid encounter within last 6 months    Recent Outpatient Visits           1 year ago Primary hypertension   [place], Warren T, MD   2 years ago Type 2 diabetes mellitus without complication, with long-term current use of insulin ([place])   [place] [name], MD   3 years ago Diabetes mellitus type 2 with complications, uncontrolled ([place])   [place] [name], MD   4 years ago Diabetes mellitus type 2 with complications, uncontrolled ([place])   [place] [name], MD   4 years ago Diabetes mellitus type 2 with complications, uncontrolled ([place])   [place] [name], Cammie Mcgee, MD       Future Appointments             In 1 month Brita Romp, NP [facility]            Passed - Cr in normal range and within 360 days    Creat  Date Value Ref Range Status  03/24/2022 0.85 0.70 - 1.22 mg/dL Final         Passed - eGFR in normal range and within 360 days    GFR, Est African American  Date Value Ref Range Status  05/17/2020 95 > OR = 60 mL/min/1.25m2 Final   GFR, Est Non African American  Date Value Ref Range Status  05/17/2020 82 > OR = 60 mL/min/1.53m2 Final   eGFR  Date Value Ref Range Status  03/24/2022 88 > OR = 60 mL/min/1.34m2 Final         Passed - CBC within normal limits and completed in the last 12 months    WBC  Date Value Ref Range Status  03/24/2022 8.2 3.8 - 10.8 Thousand/uL Final   RBC  Date Value Ref Range Status  03/24/2022 4.28 4.20 - 5.80 Million/uL Final   Hemoglobin  Date Value Ref Range Status  03/24/2022 13.4 13.2 - 17.1 g/dL Final   HCT  Date Value Ref Range Status  03/24/2022 40.0 38.5 - 50.0 % Final   MCHC  Date Value Ref Range Status  03/24/2022 33.5 32.0 - 36.0 g/dL Final   [facility]  Date Value Ref Range Status  03/24/2022 31.3 27.0 - 33.0 pg Final   MCV  Date Value Ref Range Status  03/24/2022 93.5 80.0 - 100.0 fL Final   No results found for: "PLTCOUNTKUC", "LABPLAT", "POCPLA" RDW  Date Value Ref Range Status  03/24/2022 12.8 11.0 - 15.0 % Final

## 2022-04-20 ENCOUNTER — Encounter: Payer: Self-pay | Admitting: Nurse Practitioner

## 2022-04-23 ENCOUNTER — Telehealth: Payer: Self-pay

## 2022-04-23 NOTE — Telephone Encounter (Signed)
Prescription Request  04/23/2022  LOV: 03/24/22  What is the name of the medication or equipment? HYDROcodone-acetaminophen (NORCO) 5-325 MG tablet [[id number]]  Have you contacted your pharmacy to request a refill? Yes   Which pharmacy would you like this sent to?  [id number] - [place], [place] - [name] [place] [address] Phone: [phone] Fax: [phone]    Patient notified that their request is being sent to the clinical staff for review and that they should receive a response within 2 business days.   Please advise at [facility] [phone]

## 2022-04-24 ENCOUNTER — Other Ambulatory Visit: Payer: Self-pay | Admitting: Family Medicine

## 2022-04-24 MED ORDER — HYDROCODONE-ACETAMINOPHEN 5-325 MG PO TABS: 1.0000 | ORAL_TABLET | Freq: Four times a day (QID) | ORAL | 0 refills | Status: DC | PRN

## 2022-05-09 ENCOUNTER — Other Ambulatory Visit: Payer: Self-pay | Admitting: Family Medicine

## 2022-05-11 NOTE — Telephone Encounter (Signed)
Requested Prescriptions  Refused Prescriptions Disp Refills   rosuvastatin (CRESTOR) 40 MG tablet [Pharmacy Med Name: ROSUVASTATIN 40MG  TABLETS] 60 tablet 0    Sig: TAKE 1 TABLET(40 MG) BY MOUTH DAILY     Cardiovascular:  Antilipid - Statins 2 Failed - 05/09/2022  9:42 AM      Failed - Valid encounter within last 12 months    Recent Outpatient Visits           1 year ago Primary hypertension   [facility] Donita Brooks, MD   2 years ago Type 2 diabetes mellitus without complication, with long-term current use of insulin (HCC)   [facility] Donita Brooks, MD   3 years ago Diabetes mellitus type 2 with complications, uncontrolled (HCC)   [facility] [name], [name], MD   4 years ago Diabetes mellitus type 2 with complications, uncontrolled (HCC)   [facility] [name], [name], MD   4 years ago Diabetes mellitus type 2 with complications, uncontrolled (HCC)   [facility] [name], [name], MD       Future Appointments             In 1 week Dani Gobble, NP [facility]            Failed - Lipid Panel in normal range within the last 12 months    Cholesterol  Date Value Ref Range Status  03/24/2022 176 <200 mg/dL Final   LDL Cholesterol (Calc)  Date Value Ref Range Status  03/24/2022 96 mg/dL (calc) Final    Comment:    Reference range: <100 . Desirable range <100 mg/dL for primary prevention;   <70 mg/dL for patients with CHD or diabetic patients  with > or = 2 CHD risk factors. Marland Kitchen LDL-C is now calculated using the Martin-Hopkins  calculation, which is a validated novel method providing  better accuracy than the Friedewald equation in the  estimation of LDL-C.  Horald Pollen et al. Lenox Ahr. 4098;119(14): 2061-2068  (http://education.[facility].com/faq/FAQ164)    Direct LDL  Date Value Ref Range Status  04/07/2017 109 (H) <100 mg/dL  Final    Comment:    Greatly elevated Triglycerides values (>1200 mg/dL) interfere with the dLDL assay. As no Triglycerides  testing was ordered, interpret results with caution. . Desirable range <100 mg/dL for primary prevention;   <70 mg/dL for patients with CHD or diabetic patients  with > or = 2 CHD risk factors. Marland Kitchen    HDL  Date Value Ref Range Status  03/24/2022 51 > OR = 40 mg/dL Final   Triglycerides  Date Value Ref Range Status  03/24/2022 196 (H) <150 mg/dL Final         Passed - Cr in normal range and within 360 days    Creat  Date Value Ref Range Status  03/24/2022 0.85 0.70 - 1.22 mg/dL Final         Passed - Patient is not pregnant

## 2022-05-11 NOTE — Telephone Encounter (Signed)
Requested Prescriptions  Pending Prescriptions Disp Refills   rosuvastatin (CRESTOR) 40 MG tablet [Pharmacy Med Name: ROSUVASTATIN 40MG  TABLETS] 90 tablet 3    Sig: TAKE 1 TABLET(40 MG) BY MOUTH DAILY     Cardiovascular:  Antilipid - Statins 2 Failed - 05/09/2022  9:31 AM      Failed - Valid encounter within last 12 months    Recent Outpatient Visits           1 year ago Primary hypertension   [facility] Donita Brooks, MD   2 years ago Type 2 diabetes mellitus without complication, with long-term current use of insulin (HCC)   [facility] Donita Brooks, MD   3 years ago Diabetes mellitus type 2 with complications, uncontrolled (HCC)   [facility] [name], [name], MD   4 years ago Diabetes mellitus type 2 with complications, uncontrolled (HCC)   [facility] [name], [name], MD   4 years ago Diabetes mellitus type 2 with complications, uncontrolled (HCC)   [facility] [name], [name], MD       Future Appointments             In 1 week Dani Gobble, NP [facility]            Failed - Lipid Panel in normal range within the last 12 months    Cholesterol  Date Value Ref Range Status  03/24/2022 176 <200 mg/dL Final   LDL Cholesterol (Calc)  Date Value Ref Range Status  03/24/2022 96 mg/dL (calc) Final    Comment:    Reference range: <100 . Desirable range <100 mg/dL for primary prevention;   <70 mg/dL for patients with CHD or diabetic patients  with > or = 2 CHD risk factors. Marland Kitchen LDL-C is now calculated using the Martin-Hopkins  calculation, which is a validated novel method providing  better accuracy than the Friedewald equation in the  estimation of LDL-C.  Horald Pollen et al. Lenox Ahr. 4098;119(14): 2061-2068  (http://education.[facility].com/faq/FAQ164)    Direct LDL  Date Value Ref Range Status  04/07/2017 109 (H) <100 mg/dL  Final    Comment:    Greatly elevated Triglycerides values (>1200 mg/dL) interfere with the dLDL assay. As no Triglycerides  testing was ordered, interpret results with caution. . Desirable range <100 mg/dL for primary prevention;   <70 mg/dL for patients with CHD or diabetic patients  with > or = 2 CHD risk factors. Marland Kitchen    HDL  Date Value Ref Range Status  03/24/2022 51 > OR = 40 mg/dL Final   Triglycerides  Date Value Ref Range Status  03/24/2022 196 (H) <150 mg/dL Final         Passed - Cr in normal range and within 360 days    Creat  Date Value Ref Range Status  03/24/2022 0.85 0.70 - 1.22 mg/dL Final         Passed - Patient is not pregnant

## 2022-05-18 ENCOUNTER — Other Ambulatory Visit: Payer: Self-pay | Admitting: Family Medicine

## 2022-05-18 ENCOUNTER — Telehealth: Payer: Self-pay

## 2022-05-18 MED ORDER — HYDROCODONE-ACETAMINOPHEN 5-325 MG PO TABS: 1.0000 | ORAL_TABLET | Freq: Four times a day (QID) | ORAL | 0 refills | Status: DC | PRN

## 2022-05-18 NOTE — Telephone Encounter (Signed)
Prescription Request  05/18/2022  LOV: 03/24/22  What is the name of the medication or equipment? HYDROcodone-acetaminophen (NORCO) 5-325 MG tablet [[id number]]  Have you contacted your pharmacy to request a refill? No   Which pharmacy would you like this sent to?  [id number] - [place], [name] - [name] [place] [address] Phone: [phone] Fax: [phone]    Patient notified that their request is being sent to the clinical staff for review and that they should receive a response within 2 business days.   Please advise at [facility] [phone]

## 2022-05-19 ENCOUNTER — Ambulatory Visit: Payer: PPO | Admitting: Nurse Practitioner

## 2022-05-26 ENCOUNTER — Other Ambulatory Visit: Payer: Self-pay | Admitting: Family Medicine

## 2022-05-26 DIAGNOSIS — E119 Type 2 diabetes mellitus without complications: Secondary | ICD-10-CM

## 2022-05-26 NOTE — Telephone Encounter (Signed)
Requested Prescriptions  Pending Prescriptions Disp Refills   B-D ULTRAFINE III SHORT PEN 31G X 8 MM MISC [Pharmacy Med Name: B-D PEN NDL SHRT 31GX8MM(5/16) BLU] 100 each 3    Sig: USE TO INJECT INSULIN EVERY DAY     Endocrinology: Diabetes - Testing Supplies Failed - 05/26/2022  9:37 AM      Failed - Valid encounter within last 12 months    Recent Outpatient Visits           2 years ago Primary hypertension   [facility] Donita Brooks, MD   2 years ago Type 2 diabetes mellitus without complication, with long-term current use of insulin (HCC)   [facility] Donita Brooks, MD   3 years ago Diabetes mellitus type 2 with complications, uncontrolled (HCC)   [facility] Donita Brooks, MD   4 years ago Diabetes mellitus type 2 with complications, uncontrolled (HCC)   [facility] Donita Brooks, MD   4 years ago Diabetes mellitus type 2 with complications, uncontrolled (HCC)   [facility] [name], [name], MD       Future Appointments             In 3 weeks Lurlean Leyden, Freddi Starr, NP [facility]

## 2022-06-16 NOTE — Patient Instructions (Signed)

## 2022-06-17 ENCOUNTER — Ambulatory Visit (INDEPENDENT_AMBULATORY_CARE_PROVIDER_SITE_OTHER): Payer: PPO | Admitting: Nurse Practitioner

## 2022-06-17 ENCOUNTER — Encounter: Payer: Self-pay | Admitting: Nurse Practitioner

## 2022-06-17 VITALS — BP 118/70 | HR 80 | Ht 69.0 in | Wt 135.8 lb

## 2022-06-17 DIAGNOSIS — Z794 Long term (current) use of insulin: Secondary | ICD-10-CM | POA: Diagnosis not present

## 2022-06-17 DIAGNOSIS — E119 Type 2 diabetes mellitus without complications: Secondary | ICD-10-CM

## 2022-06-17 DIAGNOSIS — Z7984 Long term (current) use of oral hypoglycemic drugs: Secondary | ICD-10-CM | POA: Diagnosis not present

## 2022-06-17 DIAGNOSIS — E782 Mixed hyperlipidemia: Secondary | ICD-10-CM | POA: Diagnosis not present

## 2022-06-17 LAB — POCT GLYCOSYLATED HEMOGLOBIN (HGB A1C): Hemoglobin A1C: 9.1 % — AB (ref 4.0–5.6)

## 2022-06-17 MED ORDER — LANTUS SOLOSTAR 100 UNIT/ML ~~LOC~~ SOPN: 10.0000 [IU] | PEN_INJECTOR | Freq: Every day | SUBCUTANEOUS | 5 refills | Status: DC

## 2022-06-17 MED ORDER — DEXCOM G7 RECEIVER DEVI: 1.0000 | Freq: Once | 0 refills | Status: AC

## 2022-06-17 MED ORDER — PIOGLITAZONE HCL 45 MG PO TABS: 45.0000 mg | ORAL_TABLET | Freq: Every day | ORAL | 1 refills | Status: DC

## 2022-06-17 MED ORDER — CREON 24000-76000 UNITS PO CPEP: ORAL_CAPSULE | ORAL | 1 refills | Status: DC

## 2022-06-17 MED ORDER — DEXCOM G7 SENSOR MISC: 1.0000 | 3 refills | Status: DC

## 2022-06-17 MED ORDER — METFORMIN HCL 1000 MG PO TABS: 500.0000 mg | ORAL_TABLET | Freq: Two times a day (BID) | ORAL | 3 refills | Status: DC
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
## 2022-06-18 ENCOUNTER — Telehealth: Payer: Self-pay

## 2022-06-18 ENCOUNTER — Other Ambulatory Visit: Payer: Self-pay | Admitting: Family Medicine

## 2022-06-18 MED ORDER — HYDROCODONE-ACETAMINOPHEN 5-325 MG PO TABS: 1.0000 | ORAL_TABLET | Freq: Four times a day (QID) | ORAL | 0 refills | Status: DC | PRN

## 2022-06-18 NOTE — Telephone Encounter (Signed)
Pt's wife called in to request a refill of HYDROcodone-acetaminophen (NORCO) 5-325 MG tablet [[id number]].  LOV: 03/24/22  PHARMACY: [id number] - [place], [place] - [address], [place] [address] Phone: [phone]  Fax: [phone]  CB#: [phone]

## 2022-06-19 DIAGNOSIS — E08649 Diabetes mellitus due to underlying condition with hypoglycemia without coma: Secondary | ICD-10-CM | POA: Diagnosis not present

## 2022-06-25 ENCOUNTER — Encounter: Payer: Self-pay | Admitting: Family Medicine

## 2022-06-25 ENCOUNTER — Ambulatory Visit (INDEPENDENT_AMBULATORY_CARE_PROVIDER_SITE_OTHER): Payer: PPO | Admitting: Family Medicine

## 2022-06-25 VITALS — BP 118/62 | HR 71 | Temp 97.8°F | Ht 69.0 in | Wt 132.8 lb

## 2022-06-25 DIAGNOSIS — F01B11 Vascular dementia, moderate, with agitation: Secondary | ICD-10-CM

## 2022-06-25 MED ORDER — BREXPIPRAZOLE 1 MG PO TABS: 1.0000 mg | ORAL_TABLET | Freq: Every day | ORAL | 2 refills | Status: DC
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
## 2022-07-15 ENCOUNTER — Other Ambulatory Visit: Payer: Self-pay

## 2022-07-15 NOTE — Telephone Encounter (Signed)
Requested medication (s) are due for refill today: yes  Requested medication (s) are on the active medication list: yes  Last refill:  06/18/22 #60/0  Future visit scheduled: no  Notes to clinic:  Unable to refill per protocol, cannot delegate.    Requested Prescriptions  Pending Prescriptions Disp Refills   HYDROcodone-acetaminophen (NORCO) 5-325 MG tablet 60 tablet 0    Sig: Take 1 tablet by mouth every 6 (six) hours as needed for moderate pain.     Not Delegated - Analgesics:  Opioid Agonist Combinations Failed - 07/15/2022  4:22 PM      Failed - This refill cannot be delegated      Failed - Urine Drug Screen completed in last 360 days      Failed - Valid encounter within last 3 months    Recent Outpatient Visits           2 years ago Primary hypertension   [facility] Donita Brooks, MD   2 years ago Type 2 diabetes mellitus without complication, with long-term current use of insulin (HCC)   [facility] Donita Brooks, MD   3 years ago Diabetes mellitus type 2 with complications, uncontrolled (HCC)   [facility] Donita Brooks, MD   4 years ago Diabetes mellitus type 2 with complications, uncontrolled (HCC)   [facility] Donita Brooks, MD   4 years ago Diabetes mellitus type 2 with complications, uncontrolled (HCC)   [facility] [name], [name], MD

## 2022-07-15 NOTE — Telephone Encounter (Signed)
Prescription Request  07/15/2022  LOV: 04/02/22  What is the name of the medication or equipment? HYDROcodone-acetaminophen (NORCO) 5-325 MG tablet [[id number]]  Have you contacted your pharmacy to request a refill? No   Which pharmacy would you like this sent to?  [id number] - [place], [place] - [name] [place] [address] Phone: [phone] Fax: [phone]    Patient notified that their request is being sent to the clinical staff for review and that they should receive a response within 2 business days.   Please advise at [facility] [phone]

## 2022-07-17 MED ORDER — HYDROCODONE-ACETAMINOPHEN 5-325 MG PO TABS: 1.0000 | ORAL_TABLET | Freq: Four times a day (QID) | ORAL | 0 refills | Status: DC | PRN

## 2022-07-19 DIAGNOSIS — E08649 Diabetes mellitus due to underlying condition with hypoglycemia without coma: Secondary | ICD-10-CM | POA: Diagnosis not present

## 2022-07-20 ENCOUNTER — Ambulatory Visit (INDEPENDENT_AMBULATORY_CARE_PROVIDER_SITE_OTHER): Payer: PPO | Admitting: Nurse Practitioner

## 2022-07-20 ENCOUNTER — Encounter: Payer: Self-pay | Admitting: Nurse Practitioner

## 2022-07-20 VITALS — BP 100/63 | HR 96 | Ht 69.0 in | Wt 133.6 lb

## 2022-07-20 DIAGNOSIS — Z7984 Long term (current) use of oral hypoglycemic drugs: Secondary | ICD-10-CM | POA: Diagnosis not present

## 2022-07-20 DIAGNOSIS — E782 Mixed hyperlipidemia: Secondary | ICD-10-CM

## 2022-07-20 DIAGNOSIS — Z794 Long term (current) use of insulin: Secondary | ICD-10-CM

## 2022-07-20 DIAGNOSIS — E119 Type 2 diabetes mellitus without complications: Secondary | ICD-10-CM
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
## 2022-07-31 ENCOUNTER — Other Ambulatory Visit: Payer: Self-pay | Admitting: Family Medicine

## 2022-07-31 NOTE — Telephone Encounter (Signed)
Requested medications are due for refill today.  Unsure  Requested medications are on the active medications list.  yes  Last refill. 06/16/2022 15mL 5 rf  Future visit scheduled.   no  Notes to clinic.  Rx signed by Ronny Bacon.    Requested Prescriptions  Pending Prescriptions Disp Refills   LANTUS SOLOSTAR 100 UNIT/ML Solostar Pen [Pharmacy Med Name: LANTUS SOLOSTAR PEN INJ 3ML] 15 mL 5    Sig: ADMINISTER 15 UNITS UNDER THE SKIN DAILY     Endocrinology:  Diabetes - Insulins Failed - 07/31/2022 10:46 AM      Failed - HBA1C is between 0 and 7.9 and within 180 days    Hemoglobin A1C  Date Value Ref Range Status  06/17/2022 9.1 (A) 4.0 - 5.6 % Final   HbA1c, POC (controlled diabetic range)  Date Value Ref Range Status  11/05/2020 12.1 (A) 0.0 - 7.0 % Final   Hgb A1c MFr Bld  Date Value Ref Range Status  03/24/2022 10.4 (H) <5.7 % of total Hgb Final    Comment:    For someone without known diabetes, a hemoglobin A1c value of 6.5% or greater indicates that they may have  diabetes and this should be confirmed with a follow-up  test. . For someone with known diabetes, a value <7% indicates  that their diabetes is well controlled and a value  greater than or equal to 7% indicates suboptimal  control. A1c targets should be individualized based on  duration of diabetes, age, comorbid conditions, and  other considerations. . Currently, no consensus exists regarding use of hemoglobin A1c for diagnosis of diabetes for children. .          Failed - Valid encounter within last 6 months    Recent Outpatient Visits           2 years ago Primary hypertension   [facility] Donita Brooks, MD   3 years ago Type 2 diabetes mellitus without complication, with long-term current use of insulin (HCC)   [facility] Donita Brooks, MD   3 years ago Diabetes mellitus type 2 with complications, uncontrolled (HCC)   [facility]  Donita Brooks, MD   4 years ago Diabetes mellitus type 2 with complications, uncontrolled (HCC)   [facility] Donita Brooks, MD   4 years ago Diabetes mellitus type 2 with complications, uncontrolled (HCC)   [facility] [name], [name], MD              Signed Prescriptions Disp Refills   losartan (COZAAR) 50 MG tablet 90 tablet 1    Sig: TAKE 1 TABLET BY MOUTH EVERY NIGHT AT BEDTIME     Cardiovascular:  Angiotensin Receptor Blockers Failed - 07/31/2022 10:46 AM      Failed - Valid encounter within last 6 months    Recent Outpatient Visits           2 years ago Primary hypertension   [facility] Donita Brooks, MD   3 years ago Type 2 diabetes mellitus without complication, with long-term current use of insulin (HCC)   [facility] Donita Brooks, MD   3 years ago Diabetes mellitus type 2 with complications, uncontrolled (HCC)   [facility] Donita Brooks, MD   4 years ago Diabetes mellitus type 2 with complications, uncontrolled (HCC)   [facility] [name], [name], MD  4 years ago Diabetes mellitus type 2 with complications, uncontrolled (HCC)   [facility] [name], [name], MD              Passed - Cr in normal range and within 180 days    Creat  Date Value Ref Range Status  03/24/2022 0.85 0.70 - 1.22 mg/dL Final         Passed - K in normal range and within 180 days    Potassium  Date Value Ref Range Status  03/24/2022 3.9 3.5 - 5.3 mmol/L Final         Passed - Patient is not pregnant      Passed - Last BP in normal range    BP Readings from Last 1 Encounters:  07/20/22 100/63

## 2022-07-31 NOTE — Telephone Encounter (Signed)
Requested Prescriptions  Pending Prescriptions Disp Refills   LANTUS SOLOSTAR 100 UNIT/ML Solostar Pen [Pharmacy Med Name: LANTUS SOLOSTAR PEN INJ 3ML] 15 mL 5    Sig: ADMINISTER 15 UNITS UNDER THE SKIN DAILY     Endocrinology:  Diabetes - Insulins Failed - 07/31/2022 10:46 AM      Failed - HBA1C is between 0 and 7.9 and within 180 days    Hemoglobin A1C  Date Value Ref Range Status  06/17/2022 9.1 (A) 4.0 - 5.6 % Final   HbA1c, POC (controlled diabetic range)  Date Value Ref Range Status  11/05/2020 12.1 (A) 0.0 - 7.0 % Final   Hgb A1c MFr Bld  Date Value Ref Range Status  03/24/2022 10.4 (H) <5.7 % of total Hgb Final    Comment:    For someone without known diabetes, a hemoglobin A1c value of 6.5% or greater indicates that they may have  diabetes and this should be confirmed with a follow-up  test. . For someone with known diabetes, a value <7% indicates  that their diabetes is well controlled and a value  greater than or equal to 7% indicates suboptimal  control. A1c targets should be individualized based on  duration of diabetes, age, comorbid conditions, and  other considerations. . Currently, no consensus exists regarding use of hemoglobin A1c for diagnosis of diabetes for children. .          Failed - Valid encounter within last 6 months    Recent Outpatient Visits           2 years ago Primary hypertension   [facility] Donita Brooks, MD   3 years ago Type 2 diabetes mellitus without complication, with long-term current use of insulin (HCC)   [facility] Donita Brooks, MD   3 years ago Diabetes mellitus type 2 with complications, uncontrolled (HCC)   [facility] Donita Brooks, MD   4 years ago Diabetes mellitus type 2 with complications, uncontrolled (HCC)   [facility] Donita Brooks, MD   4 years ago Diabetes mellitus type 2 with complications, uncontrolled (HCC)   [facility] [name], [name], MD               losartan (COZAAR) 50 MG tablet [Pharmacy Med Name: LOSARTAN 50MG  TABLETS] 90 tablet 1    Sig: TAKE 1 TABLET BY MOUTH EVERY NIGHT AT BEDTIME     Cardiovascular:  Angiotensin Receptor Blockers Failed - 07/31/2022 10:46 AM      Failed - Valid encounter within last 6 months    Recent Outpatient Visits           2 years ago Primary hypertension   [facility] Donita Brooks, MD   3 years ago Type 2 diabetes mellitus without complication, with long-term current use of insulin (HCC)   [facility] Donita Brooks, MD   3 years ago Diabetes mellitus type 2 with complications, uncontrolled (HCC)   [facility] Donita Brooks, MD   4 years ago Diabetes mellitus type 2 with complications, uncontrolled (HCC)   [facility] Donita Brooks, MD   4 years ago Diabetes mellitus type 2 with complications, uncontrolled (HCC)   [facility] [name], [name], MD              Passed - Cr in normal range and within 180 days  Creat  Date Value Ref Range Status  03/24/2022 0.85 0.70 - 1.22 mg/dL Final         Passed - K in normal range and within 180 days    Potassium  Date Value Ref Range Status  03/24/2022 3.9 3.5 - 5.3 mmol/L Final         Passed - Patient is not pregnant      Passed - Last BP in normal range    BP Readings from Last 1 Encounters:  07/20/22 100/63

## 2022-08-13 DIAGNOSIS — E08649 Diabetes mellitus due to underlying condition with hypoglycemia without coma: Secondary | ICD-10-CM | POA: Diagnosis not present

## 2022-08-15 ENCOUNTER — Other Ambulatory Visit: Payer: Self-pay | Admitting: Family Medicine

## 2022-08-18 ENCOUNTER — Telehealth: Payer: Self-pay | Admitting: Family Medicine

## 2022-08-18 ENCOUNTER — Other Ambulatory Visit: Payer: Self-pay | Admitting: Family Medicine

## 2022-08-18 MED ORDER — HYDROCODONE-ACETAMINOPHEN 5-325 MG PO TABS: 1.0000 | ORAL_TABLET | Freq: Four times a day (QID) | ORAL | 0 refills | Status: DC | PRN

## 2022-08-18 NOTE — Telephone Encounter (Signed)
Prescription Request  08/18/2022  LOV: 06/25/2022  What is the name of the medication or equipment?   HYDROcodone-acetaminophen (NORCO) 5-325 MG tablet  ** only has 1 pill left**  Have you contacted your pharmacy to request a refill? Yes   Which pharmacy would you like this sent to?  [id number] - [place], [place] - [name] [place] [address] Phone: [phone] Fax: [phone]    Patient notified that their request is being sent to the clinical staff for review and that they should receive a response within 2 business days.   Please advise patient at [phone]

## 2022-08-19 DIAGNOSIS — E08649 Diabetes mellitus due to underlying condition with hypoglycemia without coma: Secondary | ICD-10-CM | POA: Diagnosis not present

## 2022-08-24 ENCOUNTER — Other Ambulatory Visit: Payer: Self-pay | Admitting: Nurse Practitioner

## 2022-08-30 ENCOUNTER — Emergency Department (HOSPITAL_COMMUNITY): Payer: PPO

## 2022-08-30 ENCOUNTER — Inpatient Hospital Stay (HOSPITAL_COMMUNITY)
Admission: EM | Admit: 2022-08-30 | Discharge: 2022-09-04 | DRG: 522 | Disposition: A | Discharge status: Home Health | Payer: PPO | Attending: Internal Medicine | Admitting: Internal Medicine

## 2022-08-30 ENCOUNTER — Encounter: Payer: Self-pay | Admitting: Pharmacist

## 2022-08-30 ENCOUNTER — Other Ambulatory Visit: Payer: Self-pay

## 2022-08-30 ENCOUNTER — Encounter (HOSPITAL_COMMUNITY): Payer: Self-pay | Admitting: Family Medicine

## 2022-08-30 DIAGNOSIS — S72091A Other fracture of head and neck of right femur, initial encounter for closed fracture: Principal | ICD-10-CM | POA: Diagnosis present

## 2022-08-30 DIAGNOSIS — Z794 Long term (current) use of insulin: Secondary | ICD-10-CM

## 2022-08-30 DIAGNOSIS — M79641 Pain in right hand: Secondary | ICD-10-CM | POA: Diagnosis present

## 2022-08-30 DIAGNOSIS — E860 Dehydration: Secondary | ICD-10-CM | POA: Diagnosis not present

## 2022-08-30 DIAGNOSIS — Z86010 Personal history of colonic polyps: Secondary | ICD-10-CM

## 2022-08-30 DIAGNOSIS — Z808 Family history of malignant neoplasm of other organs or systems: Secondary | ICD-10-CM

## 2022-08-30 DIAGNOSIS — I10 Essential (primary) hypertension: Secondary | ICD-10-CM | POA: Diagnosis present

## 2022-08-30 DIAGNOSIS — F0394 Unspecified dementia, unspecified severity, with anxiety: Secondary | ICD-10-CM | POA: Diagnosis not present

## 2022-08-30 DIAGNOSIS — E1165 Type 2 diabetes mellitus with hyperglycemia: Secondary | ICD-10-CM | POA: Diagnosis present

## 2022-08-30 DIAGNOSIS — I700 Atherosclerosis of aorta: Secondary | ICD-10-CM | POA: Diagnosis not present

## 2022-08-30 DIAGNOSIS — W010XXA Fall on same level from slipping, tripping and stumbling without subsequent striking against object, initial encounter: Secondary | ICD-10-CM | POA: Diagnosis present

## 2022-08-30 DIAGNOSIS — M1711 Unilateral primary osteoarthritis, right knee: Secondary | ICD-10-CM | POA: Diagnosis not present

## 2022-08-30 DIAGNOSIS — Z79899 Other long term (current) drug therapy: Secondary | ICD-10-CM | POA: Diagnosis not present

## 2022-08-30 DIAGNOSIS — R64 Cachexia: Secondary | ICD-10-CM | POA: Diagnosis not present

## 2022-08-30 DIAGNOSIS — D72829 Elevated white blood cell count, unspecified: Secondary | ICD-10-CM | POA: Diagnosis present

## 2022-08-30 DIAGNOSIS — E119 Type 2 diabetes mellitus without complications: Secondary | ICD-10-CM

## 2022-08-30 DIAGNOSIS — Z95820 Peripheral vascular angioplasty status with implants and grafts: Secondary | ICD-10-CM

## 2022-08-30 DIAGNOSIS — Z87891 Personal history of nicotine dependence: Secondary | ICD-10-CM

## 2022-08-30 DIAGNOSIS — M25521 Pain in right elbow: Secondary | ICD-10-CM | POA: Diagnosis not present

## 2022-08-30 DIAGNOSIS — Z833 Family history of diabetes mellitus: Secondary | ICD-10-CM

## 2022-08-30 DIAGNOSIS — S72001A Fracture of unspecified part of neck of right femur, initial encounter for closed fracture: Secondary | ICD-10-CM | POA: Diagnosis not present

## 2022-08-30 DIAGNOSIS — Z8679 Personal history of other diseases of the circulatory system: Secondary | ICD-10-CM | POA: Diagnosis not present

## 2022-08-30 DIAGNOSIS — Y92009 Unspecified place in unspecified non-institutional (private) residence as the place of occurrence of the external cause: Secondary | ICD-10-CM | POA: Diagnosis not present

## 2022-08-30 DIAGNOSIS — S72009A Fracture of unspecified part of neck of unspecified femur, initial encounter for closed fracture: Secondary | ICD-10-CM

## 2022-08-30 DIAGNOSIS — F17210 Nicotine dependence, cigarettes, uncomplicated: Secondary | ICD-10-CM | POA: Diagnosis not present

## 2022-08-30 DIAGNOSIS — E785 Hyperlipidemia, unspecified: Secondary | ICD-10-CM | POA: Diagnosis not present

## 2022-08-30 DIAGNOSIS — E782 Mixed hyperlipidemia: Secondary | ICD-10-CM | POA: Diagnosis present

## 2022-08-30 DIAGNOSIS — M79621 Pain in right upper arm: Secondary | ICD-10-CM | POA: Diagnosis not present

## 2022-08-30 DIAGNOSIS — Z9889 Other specified postprocedural states: Secondary | ICD-10-CM

## 2022-08-30 DIAGNOSIS — I679 Cerebrovascular disease, unspecified: Secondary | ICD-10-CM | POA: Diagnosis not present

## 2022-08-30 DIAGNOSIS — Z8249 Family history of ischemic heart disease and other diseases of the circulatory system: Secondary | ICD-10-CM

## 2022-08-30 DIAGNOSIS — E11649 Type 2 diabetes mellitus with hypoglycemia without coma: Secondary | ICD-10-CM | POA: Diagnosis not present

## 2022-08-30 DIAGNOSIS — S51011A Laceration without foreign body of right elbow, initial encounter: Secondary | ICD-10-CM | POA: Diagnosis present

## 2022-08-30 DIAGNOSIS — R739 Hyperglycemia, unspecified: Secondary | ICD-10-CM

## 2022-08-30 DIAGNOSIS — Z888 Allergy status to other drugs, medicaments and biological substances status: Secondary | ICD-10-CM

## 2022-08-30 DIAGNOSIS — Z8041 Family history of malignant neoplasm of ovary: Secondary | ICD-10-CM

## 2022-08-30 DIAGNOSIS — S32591A Other specified fracture of right pubis, initial encounter for closed fracture: Secondary | ICD-10-CM | POA: Diagnosis not present

## 2022-08-30 DIAGNOSIS — W19XXXA Unspecified fall, initial encounter: Secondary | ICD-10-CM

## 2022-08-30 DIAGNOSIS — Z96641 Presence of right artificial hip joint: Secondary | ICD-10-CM | POA: Diagnosis not present

## 2022-08-30 DIAGNOSIS — F0390 Unspecified dementia without behavioral disturbance: Secondary | ICD-10-CM | POA: Diagnosis not present

## 2022-08-30 DIAGNOSIS — Z7984 Long term (current) use of oral hypoglycemic drugs: Secondary | ICD-10-CM

## 2022-08-30 DIAGNOSIS — Z85828 Personal history of other malignant neoplasm of skin: Secondary | ICD-10-CM

## 2022-08-30 DIAGNOSIS — M1909 Primary osteoarthritis, other specified site: Secondary | ICD-10-CM | POA: Diagnosis not present

## 2022-08-30 DIAGNOSIS — S72031A Displaced midcervical fracture of right femur, initial encounter for closed fracture: Secondary | ICD-10-CM | POA: Diagnosis not present

## 2022-08-30 DIAGNOSIS — K219 Gastro-esophageal reflux disease without esophagitis: Secondary | ICD-10-CM | POA: Diagnosis present

## 2022-08-30 DIAGNOSIS — Z9089 Acquired absence of other organs: Secondary | ICD-10-CM

## 2022-08-30 DIAGNOSIS — Z83438 Family history of other disorder of lipoprotein metabolism and other lipidemia: Secondary | ICD-10-CM

## 2022-08-30 DIAGNOSIS — Z89022 Acquired absence of left finger(s): Secondary | ICD-10-CM | POA: Diagnosis not present

## 2022-08-30 DIAGNOSIS — Z6820 Body mass index (BMI) 20.0-20.9, adult: Secondary | ICD-10-CM

## 2022-08-30 DIAGNOSIS — M19011 Primary osteoarthritis, right shoulder: Secondary | ICD-10-CM | POA: Diagnosis not present

## 2022-08-30 LAB — CBG MONITORING, ED
Glucose-Capillary: 515 mg/dL (ref 70–99)
Glucose-Capillary: 452 mg/dL — ABNORMAL HIGH (ref 70–99)

## 2022-08-30 LAB — BASIC METABOLIC PANEL
Anion gap: 12 (ref 5–15)
BUN: 32 mg/dL — ABNORMAL HIGH (ref 8–23)
CO2: 23 mmol/L (ref 22–32)
Calcium: 9.2 mg/dL (ref 8.9–10.3)
Chloride: 97 mmol/L — ABNORMAL LOW (ref 98–111)
Creatinine, Ser: 0.96 mg/dL (ref 0.61–1.24)
GFR, Estimated: >60 mL/min (ref >60)
Glucose, Bld: 526 mg/dL (ref 70–99)
Potassium: 4.1 mmol/L (ref 3.5–5.1)
Sodium: 132 mmol/L — ABNORMAL LOW (ref 135–145)

## 2022-08-30 LAB — CBC WITH DIFFERENTIAL/PLATELET
Abs Immature Granulocytes: 0.08 10*3/uL — ABNORMAL HIGH (ref 0.00–0.07)
Basophils Absolute: 0.1 10*3/uL (ref 0.0–0.1)
Basophils Relative: 0 %
Eosinophils Absolute: 0 10*3/uL (ref 0.0–0.5)
Eosinophils Relative: 0 %
HCT: 40 % (ref 39.0–52.0)
Hemoglobin: 13 g/dL (ref 13.0–17.0)
Immature Granulocytes: 1 %
Lymphocytes Relative: 6 %
Lymphs Abs: 1 10*3/uL (ref 0.7–4.0)
MCH: 31.3 pg (ref 26.0–34.0)
MCHC: 32.5 g/dL (ref 30.0–36.0)
MCV: 96.2 fL (ref 80.0–100.0)
Monocytes Absolute: 0.8 10*3/uL (ref 0.1–1.0)
Monocytes Relative: 5 %
Neutro Abs: 13.6 10*3/uL — ABNORMAL HIGH (ref 1.7–7.7)
Neutrophils Relative %: 88 %
Platelets: 200 10*3/uL (ref 150–400)
RBC: 4.16 MIL/uL — ABNORMAL LOW (ref 4.22–5.81)
RDW: 13.2 % (ref 11.5–15.5)
WBC: 15.6 10*3/uL — ABNORMAL HIGH (ref 4.0–10.5)
nRBC: 0 % (ref 0.0–0.2)

## 2022-08-30 LAB — URINALYSIS, ROUTINE W REFLEX MICROSCOPIC
Bacteria, UA: NONE SEEN
Bilirubin Urine: NEGATIVE
Glucose, UA: >=500 mg/dL — AB
Hgb urine dipstick: NEGATIVE
Ketones, ur: 20 mg/dL — AB
Leukocytes,Ua: NEGATIVE
Nitrite: NEGATIVE
Protein, ur: NEGATIVE mg/dL
Specific Gravity, Urine: 1.028 (ref 1.005–1.030)
pH: 5 (ref 5.0–8.0)

## 2022-08-30 LAB — PROTIME-INR
INR: 1 (ref 0.8–1.2)
Prothrombin Time: 13.5 seconds (ref 11.4–15.2)

## 2022-08-30 MED ORDER — ONDANSETRON HCL 4 MG/2ML IJ SOLN
4.0000 mg | Freq: Once | INTRAMUSCULAR | Status: AC
  Administered 2022-08-30: 4 mg via INTRAVENOUS
  Filled 2022-08-30: qty 2

## 2022-08-30 MED ORDER — LOSARTAN POTASSIUM 50 MG PO TABS
50.0000 mg | ORAL_TABLET | Freq: Every day | ORAL | Status: DC
  Administered 2022-08-30 – 2022-09-03 (×5): 50 mg via ORAL
  Filled 2022-08-30: qty 2
  Filled 2022-08-30 (×4): qty 1

## 2022-08-30 MED ORDER — ACETAMINOPHEN 325 MG PO TABS
650.0000 mg | ORAL_TABLET | Freq: Four times a day (QID) | ORAL | Status: DC | PRN
  Filled 2022-08-30: qty 2

## 2022-08-30 MED ORDER — INSULIN ASPART 100 UNIT/ML IJ SOLN: 0.0000 [IU] | Freq: Three times a day (TID) | INTRAMUSCULAR | Status: DC

## 2022-08-30 MED ORDER — HYDROMORPHONE HCL 1 MG/ML IJ SOLN
0.2500 mg | Freq: Once | INTRAMUSCULAR | Status: AC
  Administered 2022-08-30: 0.25 mg via INTRAVENOUS
  Filled 2022-08-30: qty 0.5

## 2022-08-30 MED ORDER — OXYCODONE HCL 5 MG PO TABS
5.0000 mg | ORAL_TABLET | ORAL | Status: DC | PRN
  Administered 2022-08-31 – 2022-09-04 (×6): 5 mg via ORAL
  Filled 2022-08-30 (×6): qty 1

## 2022-08-30 MED ORDER — INSULIN GLARGINE-YFGN 100 UNIT/ML ~~LOC~~ SOLN
12.0000 [IU] | Freq: Every day | SUBCUTANEOUS | Status: DC
  Administered 2022-08-31 (×2): 12 [IU] via SUBCUTANEOUS
  Filled 2022-08-30 (×3): qty 0.12

## 2022-08-30 MED ORDER — ROSUVASTATIN CALCIUM 20 MG PO TABS
40.0000 mg | ORAL_TABLET | Freq: Every day | ORAL | Status: DC
  Administered 2022-08-31 – 2022-09-04 (×5): 40 mg via ORAL
  Filled 2022-08-30 (×5): qty 2

## 2022-08-30 MED ORDER — INSULIN ASPART 100 UNIT/ML IJ SOLN: 0.0000 [IU] | Freq: Every day | INTRAMUSCULAR | Status: DC

## 2022-08-30 MED ORDER — DONEPEZIL HCL 10 MG PO TABS
10.0000 mg | ORAL_TABLET | Freq: Every day | ORAL | Status: DC
  Administered 2022-08-30 – 2022-09-03 (×5): 10 mg via ORAL
  Filled 2022-08-30 (×4): qty 1
  Filled 2022-08-30: qty 2

## 2022-08-30 MED ORDER — ONDANSETRON HCL 4 MG PO TABS: 4.0000 mg | ORAL_TABLET | Freq: Four times a day (QID) | ORAL | Status: DC | PRN

## 2022-08-30 MED ORDER — SODIUM CHLORIDE 0.9 % IV SOLN: INTRAVENOUS | Status: DC

## 2022-08-30 MED ORDER — MORPHINE SULFATE (PF) 2 MG/ML IV SOLN
2.0000 mg | INTRAVENOUS | Status: DC | PRN
  Administered 2022-08-30 – 2022-09-02 (×4): 2 mg via INTRAVENOUS
  Filled 2022-08-30 (×4): qty 1

## 2022-08-30 MED ORDER — ONDANSETRON HCL 4 MG/2ML IJ SOLN
4.0000 mg | Freq: Four times a day (QID) | INTRAMUSCULAR | Status: DC | PRN
  Administered 2022-08-31: 4 mg via INTRAVENOUS
  Filled 2022-08-30: qty 2

## 2022-08-30 MED ORDER — ACETAMINOPHEN 650 MG RE SUPP: 650.0000 mg | Freq: Four times a day (QID) | RECTAL | Status: DC | PRN

## 2022-08-30 MED ORDER — INSULIN ASPART 100 UNIT/ML IJ SOLN
10.0000 [IU] | Freq: Once | INTRAMUSCULAR | Status: AC
  Administered 2022-08-30: 10 [IU] via SUBCUTANEOUS
  Filled 2022-08-30: qty 1

## 2022-08-30 MED ORDER — MEMANTINE HCL 10 MG PO TABS
10.0000 mg | ORAL_TABLET | Freq: Two times a day (BID) | ORAL | Status: DC
  Administered 2022-08-30 – 2022-09-04 (×10): 10 mg via ORAL
  Filled 2022-08-30 (×10): qty 1

## 2022-08-30 NOTE — ED Triage Notes (Signed)
Pt has dementia, Wife states that pt was trying to turn around and lost his balance and fell injuring his right hip and elbow. Pt has large wound on his elbow and can no longer walk on his hip due to pain. Pt also soiled himself when he fell.
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
## 2022-08-30 NOTE — ED Notes (Signed)
Date and time results received: 08/30/22 2055 (use smartphrase ".now" to insert current time)  Test: glucose Critical Value: 526  Name of Provider Notified: Jearld Fenton  Orders Received? Or Actions Taken?: n/a

## 2022-08-30 NOTE — Consult Note (Signed)
Brief orthopedic consult note:   I was called by the EDP for this patient.  He is status post fall with a right femoral neck fracture.  Patient will be transferred to [facility] and admitted to the hospitalist service.  We will plan to see the patient in the a.m. and work on getting him scheduled for surgery in the next couple of days.  Please keep n.p.o. past midnight.

## 2022-08-30 NOTE — Progress Notes (Signed)
Pharmacy Quality Measure Review  This patient is appearing on a report for being at risk of failing the adherence measure for cholesterol (statin) medications this calendar year.   Medication: rosuvastatin 40 mg daily Last fill date: 08/15/22 for 90 day supply  Insurance report was not up to date. No action needed at this time.   Adam Phenix, PharmD PGY-1 Pharmacy Resident
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
## 2022-08-31 ENCOUNTER — Inpatient Hospital Stay (HOSPITAL_COMMUNITY): Payer: PPO

## 2022-08-31 DIAGNOSIS — W19XXXA Unspecified fall, initial encounter: Secondary | ICD-10-CM

## 2022-08-31 DIAGNOSIS — E860 Dehydration: Secondary | ICD-10-CM | POA: Diagnosis not present

## 2022-08-31 DIAGNOSIS — S72001A Fracture of unspecified part of neck of right femur, initial encounter for closed fracture: Secondary | ICD-10-CM

## 2022-08-31 DIAGNOSIS — E119 Type 2 diabetes mellitus without complications: Secondary | ICD-10-CM

## 2022-08-31 DIAGNOSIS — I10 Essential (primary) hypertension: Secondary | ICD-10-CM

## 2022-08-31 DIAGNOSIS — F0390 Unspecified dementia without behavioral disturbance: Secondary | ICD-10-CM | POA: Diagnosis not present

## 2022-08-31 DIAGNOSIS — Z794 Long term (current) use of insulin: Secondary | ICD-10-CM

## 2022-08-31 DIAGNOSIS — E785 Hyperlipidemia, unspecified: Secondary | ICD-10-CM

## 2022-08-31 DIAGNOSIS — Y92009 Unspecified place in unspecified non-institutional (private) residence as the place of occurrence of the external cause: Secondary | ICD-10-CM

## 2022-08-31 LAB — CBG MONITORING, ED
Glucose-Capillary: 316 mg/dL — ABNORMAL HIGH (ref 70–99)
Glucose-Capillary: 271 mg/dL — ABNORMAL HIGH (ref 70–99)

## 2022-08-31 LAB — SURGICAL PCR SCREEN
MRSA, PCR: NEGATIVE
Staphylococcus aureus: POSITIVE — AB

## 2022-08-31 LAB — GLUCOSE, CAPILLARY
Glucose-Capillary: 96 mg/dL (ref 70–99)
Glucose-Capillary: 268 mg/dL — ABNORMAL HIGH (ref 70–99)
Glucose-Capillary: 209 mg/dL — ABNORMAL HIGH (ref 70–99)

## 2022-08-31 LAB — TYPE AND SCREEN
ABO/RH(D): A POS
Antibody Screen: NEGATIVE

## 2022-08-31 MED ORDER — CHLORHEXIDINE GLUCONATE 4 % EX SOLN: 60.0000 mL | Freq: Once | CUTANEOUS | Status: DC

## 2022-08-31 MED ORDER — TRANEXAMIC ACID-NACL 1000-0.7 MG/100ML-% IV SOLN
1000.0000 mg | INTRAVENOUS | Status: DC
  Filled 2022-08-31: qty 100

## 2022-08-31 MED ORDER — INSULIN ASPART 100 UNIT/ML IJ SOLN
0.0000 [IU] | INTRAMUSCULAR | Status: DC
  Administered 2022-08-31: 8 [IU] via SUBCUTANEOUS
  Administered 2022-08-31: 5 [IU] via SUBCUTANEOUS
  Administered 2022-08-31 – 2022-09-02 (×2): 8 [IU] via SUBCUTANEOUS
  Administered 2022-09-02: 5 [IU] via SUBCUTANEOUS
  Administered 2022-09-02 (×2): 2 [IU] via SUBCUTANEOUS
  Administered 2022-09-02: 5 [IU] via SUBCUTANEOUS
  Administered 2022-09-02: 2 [IU] via SUBCUTANEOUS
  Administered 2022-09-03 (×2): 5 [IU] via SUBCUTANEOUS
  Administered 2022-09-03: 2 [IU] via SUBCUTANEOUS
  Administered 2022-09-03: 15 [IU] via SUBCUTANEOUS
  Administered 2022-09-03: 3 [IU] via SUBCUTANEOUS
  Administered 2022-09-04: 8 [IU] via SUBCUTANEOUS
  Administered 2022-09-04: 2 [IU] via SUBCUTANEOUS
  Administered 2022-09-04: 3 [IU] via SUBCUTANEOUS
  Filled 2022-08-31: qty 1

## 2022-08-31 MED ORDER — POVIDONE-IODINE 10 % EX SWAB: 2.0000 | Freq: Once | CUTANEOUS | Status: DC

## 2022-08-31 MED ORDER — CEFAZOLIN SODIUM-DEXTROSE 2-4 GM/100ML-% IV SOLN
2.0000 g | INTRAVENOUS | Status: AC
  Administered 2022-09-01: 2 g via INTRAVENOUS
  Filled 2022-08-31: qty 100

## 2022-08-31 NOTE — Assessment & Plan Note (Signed)
Continue Crestor 

## 2022-08-31 NOTE — Care Plan (Signed)
This 82 year old male with PMH significant of anxiety, dementia, GERD, hyperlipidemia, hypertension, type 2 diabetes presented to the ED s/p mechanical fall.  He is found to have right hip pain and right elbow pain.  He could not bear weight after his fall.  X-ray right hip showed basiccervical femoral neck fracture.  Orthopedics is consulted, Patient is scheduled for ORIF tomorrow.  Patient was seen and examined at bedside.  Family is at bedside.  All questions answered.

## 2022-08-31 NOTE — Assessment & Plan Note (Signed)
-   32: 0.96 BUN to creatinine ratio with hyperglycemia - Continue IV fluids - Continue to monitor

## 2022-08-31 NOTE — Assessment & Plan Note (Signed)
-   Continue ARB - Continue to monitor
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
## 2022-08-31 NOTE — Assessment & Plan Note (Signed)
-   Right hip x-ray shows basicervical femoral neck fracture - Ortho consulted and recommended admission to [facility] - Associated leukocytosis of 15.6 - Pain control - N.p.o. after midnight - Continue to monitor

## 2022-08-31 NOTE — Assessment & Plan Note (Signed)
Continue Namenda and Aricept  

## 2022-08-31 NOTE — Progress Notes (Signed)
Hypoglycemic Event  CBG: 51 Treatment: 8oz of orange juice  Symptoms: none  Follow-up CBG: Time:1250 CBG Result:96  Possible Reasons for Event: Pt was NPO  Comments/MD notified:Attending MD informed of above.    Nehemiah Settle, Jayceon Troy
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
## 2022-08-31 NOTE — Assessment & Plan Note (Signed)
No head trauma, no change in mental status - Chest x-ray is without acute finding - X-ray right elbow is without acute bony injury - X-ray right humerus is without acute bony injury - X-ray right hip is fractured see separate assessment and plan

## 2022-08-31 NOTE — Assessment & Plan Note (Signed)
-   Glucose is quite high at presentation at 526 - 10 units of insulin given in the ED - Every 4 hour glucose checks with sliding scale coverage until euglycemia is achieved - Continue to monitor

## 2022-08-31 NOTE — Plan of Care (Signed)

## 2022-08-31 NOTE — Inpatient Diabetes Management (Signed)
Inpatient Diabetes Program Recommendations  AACE/ADA: New Consensus Statement on Inpatient Glycemic Control (2015)  Target Ranges:  Prepandial:   less than 140 mg/dL      Peak postprandial:   less than 180 mg/dL (1-2 hours)      Critically ill patients:  140 - 180 mg/dL   Lab Results  Component Value Date   GLUCAP 271 (H) 08/31/2022   HGBA1C 9.1 (A) 06/17/2022    Review of Glycemic Control  Latest Reference Range & Units 08/30/22 21:07 08/30/22 22:06 08/31/22 01:06 08/31/22 03:50  Glucose-Capillary 70 - 99 mg/dL 782 (HH) 956 (H) 213 (H) 271 (H)   Diabetes history: DM 2 Outpatient Diabetes medications: Lantus 15 units, Actos 45 mg Daily Current orders for Inpatient glycemic control:  Semglee 12 units qhs Novolog 0-15 units Q4 hours  A1c 9.1% on 6/5  Inpatient Diabetes Program Recommendations:    -   Increase Semglee to 18 units  Thanks,  Christena Deem RN, MSN, BC-ADM Inpatient Diabetes Coordinator Team Pager [phone] (8a-5p)

## 2022-09-01 ENCOUNTER — Other Ambulatory Visit: Payer: Self-pay

## 2022-09-01 ENCOUNTER — Inpatient Hospital Stay (HOSPITAL_COMMUNITY): Payer: PPO | Admitting: Anesthesiology

## 2022-09-01 ENCOUNTER — Inpatient Hospital Stay (HOSPITAL_COMMUNITY): Payer: PPO

## 2022-09-01 ENCOUNTER — Encounter (HOSPITAL_COMMUNITY): Payer: Self-pay | Admitting: Family Medicine

## 2022-09-01 ENCOUNTER — Encounter (HOSPITAL_COMMUNITY)
Admission: EM | Disposition: A | Discharge status: Home Health | Payer: Self-pay | Source: Home / Self Care | Attending: Internal Medicine

## 2022-09-01 DIAGNOSIS — S72001A Fracture of unspecified part of neck of right femur, initial encounter for closed fracture: Secondary | ICD-10-CM

## 2022-09-01 DIAGNOSIS — F17210 Nicotine dependence, cigarettes, uncomplicated: Secondary | ICD-10-CM | POA: Diagnosis not present

## 2022-09-01 DIAGNOSIS — I10 Essential (primary) hypertension: Secondary | ICD-10-CM

## 2022-09-01 DIAGNOSIS — I679 Cerebrovascular disease, unspecified: Secondary | ICD-10-CM

## 2022-09-01 HISTORY — PX: TOENAIL TRIMMING: SHX6631

## 2022-09-01 HISTORY — PX: ANTERIOR APPROACH HEMI HIP ARTHROPLASTY: SHX6690

## 2022-09-01 LAB — CBC
HCT: 37.6 % — ABNORMAL LOW (ref 39.0–52.0)
Hemoglobin: 12.3 g/dL — ABNORMAL LOW (ref 13.0–17.0)
MCH: 31.1 pg (ref 26.0–34.0)
MCHC: 32.7 g/dL (ref 30.0–36.0)
MCV: 95.2 fL (ref 80.0–100.0)
Platelets: 137 10*3/uL — ABNORMAL LOW (ref 150–400)
RBC: 3.95 MIL/uL — ABNORMAL LOW (ref 4.22–5.81)
RDW: 13.2 % (ref 11.5–15.5)
WBC: 13.9 10*3/uL — ABNORMAL HIGH (ref 4.0–10.5)
nRBC: 0 % (ref 0.0–0.2)

## 2022-09-01 LAB — GLUCOSE, CAPILLARY
Glucose-Capillary: 108 mg/dL — ABNORMAL HIGH (ref 70–99)
Glucose-Capillary: 112 mg/dL — ABNORMAL HIGH (ref 70–99)
Glucose-Capillary: 131 mg/dL — ABNORMAL HIGH (ref 70–99)
Glucose-Capillary: 165 mg/dL — ABNORMAL HIGH (ref 70–99)
Glucose-Capillary: 241 mg/dL — ABNORMAL HIGH (ref 70–99)
Glucose-Capillary: 46 mg/dL — ABNORMAL LOW (ref 70–99)
Glucose-Capillary: 62 mg/dL — ABNORMAL LOW (ref 70–99)
Glucose-Capillary: 68 mg/dL — ABNORMAL LOW (ref 70–99)
Glucose-Capillary: 68 mg/dL — ABNORMAL LOW (ref 70–99)
Glucose-Capillary: 97 mg/dL (ref 70–99)

## 2022-09-01 LAB — CREATININE, SERUM
Creatinine, Ser: 0.75 mg/dL (ref 0.61–1.24)
GFR, Estimated: >60 mL/min (ref >60)

## 2022-09-01 SURGERY — HEMIARTHROPLASTY, HIP, DIRECT ANTERIOR APPROACH, FOR FRACTURE
Anesthesia: General | Site: Hip | Laterality: Right

## 2022-09-01 MED ORDER — ACETAMINOPHEN 325 MG PO TABS
650.0000 mg | ORAL_TABLET | Freq: Three times a day (TID) | ORAL | Status: DC
  Administered 2022-09-02 – 2022-09-04 (×7): 650 mg via ORAL
  Filled 2022-09-01 (×8): qty 2

## 2022-09-01 MED ORDER — PROPOFOL 10 MG/ML IV BOLUS
INTRAVENOUS | Status: AC
  Filled 2022-09-01: qty 20

## 2022-09-01 MED ORDER — TRANEXAMIC ACID-NACL 1000-0.7 MG/100ML-% IV SOLN
1000.0000 mg | Freq: Once | INTRAVENOUS | Status: AC
  Administered 2022-09-02: 1000 mg via INTRAVENOUS
  Filled 2022-09-01: qty 100

## 2022-09-01 MED ORDER — INSULIN GLARGINE-YFGN 100 UNIT/ML ~~LOC~~ SOLN
9.0000 [IU] | Freq: Every day | SUBCUTANEOUS | Status: DC
  Administered 2022-09-02 (×2): 9 [IU] via SUBCUTANEOUS
  Filled 2022-09-01 (×4): qty 0.09

## 2022-09-01 MED ORDER — PROPOFOL 10 MG/ML IV BOLUS
INTRAVENOUS | Status: DC | PRN
Start: 2022-09-01 — End: 2022-09-01
  Administered 2022-09-01: 50 mg via INTRAVENOUS

## 2022-09-01 MED ORDER — FENTANYL CITRATE (PF) 250 MCG/5ML IJ SOLN
INTRAMUSCULAR | Status: DC | PRN
  Administered 2022-09-01 (×2): 100 ug via INTRAVENOUS

## 2022-09-01 MED ORDER — INSULIN ASPART 100 UNIT/ML IJ SOLN: 0.0000 [IU] | INTRAMUSCULAR | Status: DC | PRN

## 2022-09-01 MED ORDER — PHENYLEPHRINE 80 MCG/ML (10ML) SYRINGE FOR IV PUSH (FOR BLOOD PRESSURE SUPPORT)
PREFILLED_SYRINGE | INTRAVENOUS | Status: DC | PRN
  Administered 2022-09-01 (×3): 120 ug via INTRAVENOUS

## 2022-09-01 MED ORDER — LIDOCAINE 2% (20 MG/ML) 5 ML SYRINGE
INTRAMUSCULAR | Status: DC | PRN
  Administered 2022-09-01: 60 mg via INTRAVENOUS

## 2022-09-01 MED ORDER — DEXTROSE 50 % IV SOLN
50.0000 mL | Freq: Once | INTRAVENOUS | Status: AC
  Administered 2022-09-01: 50 mL via INTRAVENOUS

## 2022-09-01 MED ORDER — CHLORHEXIDINE GLUCONATE CLOTH 2 % EX PADS
6.0000 | MEDICATED_PAD | Freq: Every day | CUTANEOUS | Status: DC
  Administered 2022-09-01 – 2022-09-04 (×4): 6 via TOPICAL

## 2022-09-01 MED ORDER — MUPIROCIN 2 % EX OINT
1.0000 | TOPICAL_OINTMENT | Freq: Two times a day (BID) | CUTANEOUS | Status: DC
  Administered 2022-09-01 – 2022-09-04 (×8): 1 via NASAL
  Filled 2022-09-01: qty 22

## 2022-09-01 MED ORDER — METOCLOPRAMIDE HCL 5 MG/ML IJ SOLN: 5.0000 mg | Freq: Three times a day (TID) | INTRAMUSCULAR | Status: DC | PRN

## 2022-09-01 MED ORDER — ORAL CARE MOUTH RINSE: 15.0000 mL | Freq: Once | OROMUCOSAL | Status: AC

## 2022-09-01 MED ORDER — ONDANSETRON HCL 4 MG/2ML IJ SOLN: 4.0000 mg | Freq: Four times a day (QID) | INTRAMUSCULAR | Status: DC | PRN

## 2022-09-01 MED ORDER — CEFAZOLIN SODIUM-DEXTROSE 2-4 GM/100ML-% IV SOLN
2.0000 g | Freq: Four times a day (QID) | INTRAVENOUS | Status: AC
  Administered 2022-09-01 – 2022-09-02 (×2): 2 g via INTRAVENOUS
  Filled 2022-09-01 (×2): qty 100

## 2022-09-01 MED ORDER — 0.9 % SODIUM CHLORIDE (POUR BTL) OPTIME
TOPICAL | Status: DC | PRN
  Administered 2022-09-01: 1000 mL

## 2022-09-01 MED ORDER — EPHEDRINE 5 MG/ML INJ
INTRAVENOUS | Status: AC
  Filled 2022-09-01: qty 5

## 2022-09-01 MED ORDER — FENTANYL CITRATE (PF) 100 MCG/2ML IJ SOLN
INTRAMUSCULAR | Status: AC
  Filled 2022-09-01: qty 2

## 2022-09-01 MED ORDER — ONDANSETRON HCL 4 MG PO TABS: 4.0000 mg | ORAL_TABLET | Freq: Four times a day (QID) | ORAL | Status: DC | PRN

## 2022-09-01 MED ORDER — LACTATED RINGERS IV SOLN: INTRAVENOUS | Status: DC

## 2022-09-01 MED ORDER — ACETAMINOPHEN 10 MG/ML IV SOLN
INTRAVENOUS | Status: AC
  Filled 2022-09-01: qty 100

## 2022-09-01 MED ORDER — ONDANSETRON HCL 4 MG/2ML IJ SOLN: 4.0000 mg | Freq: Once | INTRAMUSCULAR | Status: DC | PRN

## 2022-09-01 MED ORDER — SUGAMMADEX SODIUM 200 MG/2ML IV SOLN
INTRAVENOUS | Status: DC | PRN
  Administered 2022-09-01: 200 mg via INTRAVENOUS

## 2022-09-01 MED ORDER — DOCUSATE SODIUM 100 MG PO CAPS
100.0000 mg | ORAL_CAPSULE | Freq: Two times a day (BID) | ORAL | Status: DC
  Administered 2022-09-01 – 2022-09-04 (×6): 100 mg via ORAL
  Filled 2022-09-01 (×6): qty 1

## 2022-09-01 MED ORDER — FENTANYL CITRATE (PF) 100 MCG/2ML IJ SOLN
25.0000 ug | INTRAMUSCULAR | Status: DC | PRN
  Administered 2022-09-01: 50 ug via INTRAVENOUS

## 2022-09-01 MED ORDER — LABETALOL HCL 5 MG/ML IV SOLN
INTRAVENOUS | Status: DC | PRN
Start: 2022-09-01 — End: 2022-09-01
  Administered 2022-09-01: 7.5 mg via INTRAVENOUS

## 2022-09-01 MED ORDER — ACETAMINOPHEN 10 MG/ML IV SOLN: 1000.0000 mg | Freq: Once | INTRAVENOUS | Status: DC | PRN

## 2022-09-01 MED ORDER — AMISULPRIDE (ANTIEMETIC) 5 MG/2ML IV SOLN: 10.0000 mg | Freq: Once | INTRAVENOUS | Status: DC | PRN

## 2022-09-01 MED ORDER — ACETAMINOPHEN 650 MG RE SUPP: 650.0000 mg | Freq: Three times a day (TID) | RECTAL | Status: DC

## 2022-09-01 MED ORDER — LIDOCAINE 2% (20 MG/ML) 5 ML SYRINGE
INTRAMUSCULAR | Status: AC
  Filled 2022-09-01: qty 5

## 2022-09-01 MED ORDER — DEXAMETHASONE SODIUM PHOSPHATE 10 MG/ML IJ SOLN
INTRAMUSCULAR | Status: DC | PRN
  Administered 2022-09-01: 4 mg via INTRAVENOUS

## 2022-09-01 MED ORDER — ROCURONIUM BROMIDE 10 MG/ML (PF) SYRINGE
PREFILLED_SYRINGE | INTRAVENOUS | Status: AC
  Filled 2022-09-01: qty 10

## 2022-09-01 MED ORDER — ONDANSETRON HCL 4 MG/2ML IJ SOLN
INTRAMUSCULAR | Status: DC | PRN
Start: 2022-09-01 — End: 2022-09-01
  Administered 2022-09-01: 4 mg via INTRAVENOUS

## 2022-09-01 MED ORDER — MENTHOL 3 MG MT LOZG: 1.0000 | LOZENGE | OROMUCOSAL | Status: DC | PRN

## 2022-09-01 MED ORDER — MIDAZOLAM HCL 2 MG/2ML IJ SOLN
INTRAMUSCULAR | Status: AC
  Filled 2022-09-01: qty 2

## 2022-09-01 MED ORDER — GLUCAGON HCL RDNA (DIAGNOSTIC) 1 MG IJ SOLR
INTRAMUSCULAR | Status: AC
  Administered 2022-09-01: 1 mg
  Filled 2022-09-01: qty 1

## 2022-09-01 MED ORDER — METOCLOPRAMIDE HCL 5 MG PO TABS: 5.0000 mg | ORAL_TABLET | Freq: Three times a day (TID) | ORAL | Status: DC | PRN

## 2022-09-01 MED ORDER — PHENOL 1.4 % MT LIQD: 1.0000 | OROMUCOSAL | Status: DC | PRN

## 2022-09-01 MED ORDER — ROCURONIUM BROMIDE 10 MG/ML (PF) SYRINGE
PREFILLED_SYRINGE | INTRAVENOUS | Status: DC | PRN
  Administered 2022-09-01: 15 mg via INTRAVENOUS
  Administered 2022-09-01: 60 mg via INTRAVENOUS
  Administered 2022-09-01: 10 mg via INTRAVENOUS

## 2022-09-01 MED ORDER — VANCOMYCIN HCL 1000 MG IV SOLR
INTRAVENOUS | Status: DC | PRN
Start: 2022-09-01 — End: 2022-09-01
  Administered 2022-09-01: 1000 mg via TOPICAL

## 2022-09-01 MED ORDER — VANCOMYCIN HCL 1000 MG IV SOLR
INTRAVENOUS | Status: AC
  Filled 2022-09-01: qty 20

## 2022-09-01 MED ORDER — CHLORHEXIDINE GLUCONATE 0.12 % MT SOLN
OROMUCOSAL | Status: AC
  Administered 2022-09-01: 15 mL via OROMUCOSAL
  Filled 2022-09-01: qty 15

## 2022-09-01 MED ORDER — DEXAMETHASONE SODIUM PHOSPHATE 10 MG/ML IJ SOLN
INTRAMUSCULAR | Status: AC
  Filled 2022-09-01: qty 1

## 2022-09-01 MED ORDER — ENOXAPARIN SODIUM 40 MG/0.4ML IJ SOSY
40.0000 mg | PREFILLED_SYRINGE | INTRAMUSCULAR | Status: DC
  Administered 2022-09-02 – 2022-09-04 (×3): 40 mg via SUBCUTANEOUS
  Filled 2022-09-01 (×3): qty 0.4

## 2022-09-01 MED ORDER — PHENYLEPHRINE 80 MCG/ML (10ML) SYRINGE FOR IV PUSH (FOR BLOOD PRESSURE SUPPORT)
PREFILLED_SYRINGE | INTRAVENOUS | Status: AC
  Filled 2022-09-01: qty 10

## 2022-09-01 MED ORDER — LABETALOL HCL 5 MG/ML IV SOLN
INTRAVENOUS | Status: AC
  Filled 2022-09-01: qty 4

## 2022-09-01 MED ORDER — ACETAMINOPHEN 10 MG/ML IV SOLN
INTRAVENOUS | Status: DC | PRN
  Administered 2022-09-01: 1000 mg via INTRAVENOUS

## 2022-09-01 MED ORDER — FENTANYL CITRATE (PF) 250 MCG/5ML IJ SOLN
INTRAMUSCULAR | Status: AC
  Filled 2022-09-01: qty 5

## 2022-09-01 MED ORDER — CHLORHEXIDINE GLUCONATE 0.12 % MT SOLN: 15.0000 mL | Freq: Once | OROMUCOSAL | Status: AC

## 2022-09-01 MED ORDER — ONDANSETRON HCL 4 MG/2ML IJ SOLN
INTRAMUSCULAR | Status: AC
  Filled 2022-09-01: qty 2

## 2022-09-01 SURGICAL SUPPLY — 79 items
BAG COUNTER SPONGE SURGICOUNT (BAG) ×2 IMPLANT
BAG SPNG CNTER NS LX DISP (BAG) ×2
BLADE SAW SGTL 73X25 THK (BLADE) ×2 IMPLANT
BNDG GAUZE DERMACEA FLUFF 4 (GAUZE/BANDAGES/DRESSINGS) IMPLANT
BNDG GZE DERMACEA 4 6PLY (GAUZE/BANDAGES/DRESSINGS) ×2
BRUSH FEMORAL CANAL (MISCELLANEOUS) IMPLANT
BRUSH SCRUB EZ PLAIN DRY (MISCELLANEOUS) ×4 IMPLANT
COVER BACK TABLE 24X17X13 BIG (DRAPES) IMPLANT
DRAPE C-ARMOR (DRAPES) ×2 IMPLANT
DRAPE HIP W/POCKET STRL (MISCELLANEOUS) ×2 IMPLANT
DRAPE IMP U-DRAPE 54X76 (DRAPES) ×2 IMPLANT
DRAPE INCISE IOBAN 85X60 (DRAPES) ×4 IMPLANT
DRAPE ORTHO SPLIT 77X108 STRL (DRAPES) ×4
DRAPE SURG ORHT 6 SPLT 77X108 (DRAPES) ×4 IMPLANT
DRAPE U-SHAPE 47X51 STRL (DRAPES) ×2 IMPLANT
DRSG AQUACEL AG ADV 3.5X14 (GAUZE/BANDAGES/DRESSINGS) IMPLANT
DRSG MEPILEX POST OP 4X12 (GAUZE/BANDAGES/DRESSINGS) IMPLANT
DRSG MEPILEX POST OP 4X8 (GAUZE/BANDAGES/DRESSINGS) IMPLANT
DRSG MEPITEL 4X7.2 (GAUZE/BANDAGES/DRESSINGS) IMPLANT
ELECT BLADE 6.5 EXT (BLADE) IMPLANT
ELECT CAUTERY BLADE 6.4 (BLADE) ×2 IMPLANT
ELECT REM PT RETURN 9FT ADLT (ELECTROSURGICAL) ×2
ELECTRODE REM PT RTRN 9FT ADLT (ELECTROSURGICAL) ×2 IMPLANT
EVACUATOR 1/8 PVC DRAIN (DRAIN) IMPLANT
GAUZE SPONGE 4X4 12PLY STRL (GAUZE/BANDAGES/DRESSINGS) IMPLANT
GLOVE BIO SURGEON STRL SZ7.5 (GLOVE) ×2 IMPLANT
GLOVE BIO SURGEON STRL SZ8 (GLOVE) ×2 IMPLANT
GLOVE BIOGEL PI IND STRL 7.5 (GLOVE) ×2 IMPLANT
GLOVE BIOGEL PI IND STRL 8 (GLOVE) ×2 IMPLANT
GLOVE SURG ORTHO LTX SZ7.5 (GLOVE) ×4 IMPLANT
GOWN STRL REUS W/ TWL LRG LVL3 (GOWN DISPOSABLE) ×4 IMPLANT
GOWN STRL REUS W/ TWL XL LVL3 (GOWN DISPOSABLE) ×2 IMPLANT
GOWN STRL REUS W/TWL 2XL LVL3 (GOWN DISPOSABLE) IMPLANT
GOWN STRL REUS W/TWL LRG LVL3 (GOWN DISPOSABLE) ×4
GOWN STRL REUS W/TWL XL LVL3 (GOWN DISPOSABLE) ×4
HANDPIECE INTERPULSE COAX TIP (DISPOSABLE)
HEAD FEM UNIPOLAR 49 OD STRL (Hips) IMPLANT
IMMOBILIZER KNEE 20 (SOFTGOODS)
IMMOBILIZER KNEE 20 THIGH 36 (SOFTGOODS) IMPLANT
IMMOBILIZER KNEE 22 UNIV (SOFTGOODS) IMPLANT
IMMOBILIZER KNEE 24 THIGH 36 (MISCELLANEOUS) IMPLANT
IMMOBILIZER KNEE 24 UNIV (MISCELLANEOUS)
KIT BASIN OR (CUSTOM PROCEDURE TRAY) ×2 IMPLANT
KIT TURNOVER KIT B (KITS) ×2 IMPLANT
MANIFOLD NEPTUNE II (INSTRUMENTS) ×2 IMPLANT
NS IRRIG 1000ML POUR BTL (IV SOLUTION) ×2 IMPLANT
PACK TOTAL JOINT (CUSTOM PROCEDURE TRAY) ×2 IMPLANT
PACK UNIVERSAL I (CUSTOM PROCEDURE TRAY) ×2 IMPLANT
PAD ARMBOARD 7.5X6 YLW CONV (MISCELLANEOUS) ×4 IMPLANT
PASSER SUT SWANSON 36MM LOOP (INSTRUMENTS) ×2 IMPLANT
PILLOW ABDUCTION MEDIUM (MISCELLANEOUS) ×2 IMPLANT
PRESSURIZER FEMORAL UNIV (MISCELLANEOUS) IMPLANT
RETRIEVER SUT HEWSON (MISCELLANEOUS) IMPLANT
SET HNDPC FAN SPRY TIP SCT (DISPOSABLE) IMPLANT
SPACER FEM TAPERED +10 12/14 (Hips) IMPLANT
SPONGE T-LAP 18X18 ~~LOC~~+RFID (SPONGE) ×2 IMPLANT
STAPLER VISISTAT 35W (STAPLE) ×2 IMPLANT
STEM SUMMIT PRESSFIT SZ 6 (Hips) IMPLANT
SUCTION TUBE FRAZIER 10FR DISP (SUCTIONS) ×2 IMPLANT
SUT ETHILON 2 0 FS 18 (SUTURE) IMPLANT
SUT FIBERWIRE #2 38 T-5 BLUE (SUTURE) ×4
SUT VIC AB 0 CT1 36 (SUTURE) IMPLANT
SUT VIC AB 1 CT1 18XCR BRD 8 (SUTURE) IMPLANT
SUT VIC AB 1 CT1 27 (SUTURE) ×6
SUT VIC AB 1 CT1 27XBRD ANTBC (SUTURE) IMPLANT
SUT VIC AB 1 CT1 36 (SUTURE) IMPLANT
SUT VIC AB 1 CT1 8-18 (SUTURE) ×2
SUT VIC AB 1 CTB1 27 (SUTURE) ×4 IMPLANT
SUT VIC AB 2-0 CT1 27 (SUTURE) ×2
SUT VIC AB 2-0 CT1 36 (SUTURE) ×4 IMPLANT
SUT VIC AB 2-0 CT1 TAPERPNT 27 (SUTURE) IMPLANT
SUTURE FIBERWR #2 38 T-5 BLUE (SUTURE) ×4 IMPLANT
TOWEL GREEN STERILE (TOWEL DISPOSABLE) ×4 IMPLANT
TOWEL GREEN STERILE FF (TOWEL DISPOSABLE) ×2 IMPLANT
TOWER CARTRIDGE SMART MIX (DISPOSABLE) IMPLANT
TRAY CATH INTERMITTENT SS 16FR (CATHETERS) IMPLANT
TRAY FOLEY MTR SLVR 16FR STAT (SET/KITS/TRAYS/PACK) IMPLANT
WATER STERILE IRR 1000ML POUR (IV SOLUTION) ×6 IMPLANT
YANKAUER SUCT BULB TIP NO VENT (SUCTIONS) IMPLANT

## 2022-09-01 NOTE — Progress Notes (Signed)
At 0034: patient has a CBG of 46.  He was asymptomatic.  Gave Glucagon 1 mg.  At 0100 his CBG was 62.  Gave juice 4 oz.  Glucose came up to 68.  Re-checked again at 0355, glucose was 97.

## 2022-09-01 NOTE — Progress Notes (Addendum)
Hypoglycemic Event  CBG 68  Treatment: D50 as ordered  Symptoms: asymptomatic  Follow-up CBG: Time:0910 CBG Result:165  Possible Reasons for Event: Pt is NPO  Comments/MD notified: Attending MD  aware of above    Nehemiah Settle, Renay Crammer

## 2022-09-01 NOTE — Progress Notes (Signed)
PROGRESS NOTE    [name] [name]  ZOX:[id number] DOB: 11-03-1940 DOA: 08/30/2022 PCP: Donita Brooks, MD   Brief Narrative:  This 82 year old male with PMH significant of anxiety, dementia, GERD, hyperlipidemia, hypertension, type 2 diabetes presented to the ED s/p mechanical fall.  He is found to have right hip pain and right elbow pain.  He could not bear weight after his fall.  X-ray right hip showed basiccervical femoral neck fracture.  Orthopedics is consulted, Patient is scheduled for ORIF tomorrow.   Assessment & Plan:   Principal Problem:   Hip fracture (HCC) Active Problems:   Diabetes mellitus type 2, uncomplicated (HCC)   Hyperlipemia   Hypertension   Dehydration   Dementia (HCC)   Fall at home, initial encounter  Right Hip fracture: Patient presented status post mechanical fall. Right hip x-ray showed basicervical femoral neck fracture. Ortho consulted , scheduled for ORIF on 8/21. Associated leukocytosis of 15.6 likely reactive. Continue Adequate Pain control NPO. after midnight Continue to monitor   Fall at home, initial encounter: No head trauma, no change in mental status. Skeletal survey negative except right hip fracture. Continue fall precautions PT and OT evaluation   Dementia (HCC) Continue Namenda and Aricept   Dehydration: Continue IV fluids Continue to monitor   Hypertension : Continue ARB Continue to monitor   Hyperlipemia Continue Crestor   Diabetes mellitus type 2, uncomplicated (HCC) Glucose is quite high at presentation at 526 10 units of insulin given in the ED Every 4 hour glucose checks with sliding scale coverage until euglycemia is achieved Continue to monitor    DVT prophylaxis: SCDs Code Status: Full code Family Communication: No family at bed side. Disposition Plan:    Status is: Inpatient Remains inpatient appropriate because: Admitted status post mechanical fall with right hip fracture.  Orthopedics is consulted  and scheduled for ORIF tomorrow.    Consultants:  Orthopaedics   Procedures: scheduled ORIF in am.  Antimicrobials: Anti-infectives (From admission, onward)    Start     Dose/Rate Route Frequency Ordered Stop   09/01/22 0600  ceFAZolin (ANCEF) IVPB 2g/100 mL premix        2 g 200 mL/hr over 30 Minutes Intravenous On call to O.R. 08/31/22 1855 09/02/22 0559      Subjective: Patient was seen and examined at bedside.  Overnight events noted.   Patient is demented but following commands.  Patient has tenderness noted on the right hip area.  Objective: Vitals:   08/31/22 2015 08/31/22 2100 09/01/22 0357 09/01/22 0815  BP: 134/73  131/64 (!) 141/78  Pulse: 60  90 89  Resp: 18  18 17   Temp: 97.9 F (36.6 C)   98.4 F (36.9 C)  TempSrc: Oral   Oral  SpO2: (!) 70% 97% 90% 98%  Weight:      Height:        Intake/Output Summary (Last 24 hours) at 09/01/2022 1238 Last data filed at 09/01/2022 1209 Gross per 24 hour  Intake 788.61 ml  Output 1450 ml  Net -661.39 ml   Filed Weights   08/30/22 1713  Weight: 63.5 kg    Examination:  General exam: Appears calm and comfortable, deconditioned, not in any acute distress. Respiratory system: Clear to auscultation. Respiratory effort normal.  RR 13 Cardiovascular system: S1 & S2 heard, RRR. No JVD, murmurs, rubs, gallops or clicks. No pedal edema. Gastrointestinal system: Abdomen is non distended, soft and non tender. Normal bowel sounds heard. Central nervous system: Alert and  oriented X 1. No focal neurological deficits. Extremities: Right hip tenderness noted. Skin: No rashes, lesions or ulcers Psychiatry:  Mood & affect appropriate.     Data Reviewed: I have personally reviewed following labs and imaging studies  CBC: Recent Labs  Lab 08/30/22 2002  WBC 15.6*  NEUTROABS 13.6*  HGB 13.0  HCT 40.0  MCV 96.2  PLT 200   Basic Metabolic Panel: Recent Labs  Lab 08/30/22 2002  NA 132*  K 4.1  CL 97*  CO2 23   GLUCOSE 526*  BUN 32*  CREATININE 0.96  CALCIUM 9.2   GFR: Estimated Creatinine Clearance: 54.2 mL/min (by C-G formula based on SCr of 0.96 mg/dL). Liver Function Tests: No results for input(s): "AST", "ALT", "ALKPHOS", "BILITOT", "PROT", "ALBUMIN" in the last 168 hours. No results for input(s): "LIPASE", "AMYLASE" in the last 168 hours. No results for input(s): "AMMONIA" in the last 168 hours. Coagulation Profile: Recent Labs  Lab 08/30/22 2002  INR 1.0   Cardiac Enzymes: No results for input(s): "CKTOTAL", "CKMB", "CKMBINDEX", "TROPONINI" in the last 168 hours. BNP (last 3 results) No results for input(s): "PROBNP" in the last 8760 hours. HbA1C: No results for input(s): "HGBA1C" in the last 72 hours. CBG: Recent Labs  Lab 09/01/22 0157 09/01/22 0355 09/01/22 0812 09/01/22 0922 09/01/22 1206  GLUCAP 68* 97 68* 165* 131*   Lipid Profile: No results for input(s): "CHOL", "HDL", "LDLCALC", "TRIG", "CHOLHDL", "LDLDIRECT" in the last 72 hours. Thyroid Function Tests: No results for input(s): "TSH", "T4TOTAL", "FREET4", "T3FREE", "THYROIDAB" in the last 72 hours. Anemia Panel: No results for input(s): "VITAMINB12", "FOLATE", "FERRITIN", "TIBC", "IRON", "RETICCTPCT" in the last 72 hours. Sepsis Labs: No results for input(s): "PROCALCITON", "LATICACIDVEN" in the last 168 hours.  Recent Results (from the past 240 hour(s))  Surgical pcr screen     Status: Abnormal   Collection Time: 08/31/22  1:20 PM   Specimen: Nasal Mucosa; Nasal Swab  Result Value Ref Range Status   MRSA, PCR NEGATIVE NEGATIVE Final   Staphylococcus aureus POSITIVE (A) NEGATIVE Final    Comment: (NOTE) The Xpert SA Assay (FDA approved for NASAL specimens in patients 59 years of age and older), is one component of a comprehensive surveillance program. It is not intended to diagnose infection nor to guide or monitor treatment. Performed at [facility], [address] [place],  [place] [address]     Radiology Studies: DG Knee Right Port  Result Date: 08/31/2022 CLINICAL DATA:  Femoral neck fracture., Recent fall. EXAM: PORTABLE RIGHT KNEE - 1-2 VIEW COMPARISON:  None Available. FINDINGS: No evidence of fracture, dislocation, or joint effusion. Minor degenerative change for age with peripheral spurring. No erosion or focal bone abnormality. Soft tissues are unremarkable. IMPRESSION: No fracture or subluxation of the right knee. Minor degenerative change. Electronically Signed   By: Narda Rutherford M.D.   On: 08/31/2022 15:08   DG Chest Port 1 View  Result Date: 08/30/2022 CLINICAL DATA:  Fall, hip fracture EXAM: PORTABLE CHEST 1 VIEW COMPARISON:  06/10/2011 FINDINGS: Heart and mediastinal contours within normal limits. Aortic atherosclerosis. Lungs clear. No effusions or pneumothorax. No acute bony abnormality. IMPRESSION: No active disease. Electronically Signed   By: Charlett Nose M.D.   On: 08/30/2022 19:14   DG Humerus Right  Result Date: 08/30/2022 CLINICAL DATA:  Fall, right arm pain EXAM: RIGHT HUMERUS - 2+ VIEW COMPARISON:  None Available. FINDINGS: No acute bony abnormality. Specifically, no fracture, subluxation, or dislocation. Degenerative changes in the right  AC joint. IMPRESSION: No acute bony abnormality. Electronically Signed   By: Charlett Nose M.D.   On: 08/30/2022 19:13   DG Elbow Complete Right  Result Date: 08/30/2022 CLINICAL DATA:  Fall, right elbow pain EXAM: RIGHT ELBOW - COMPLETE 3+ VIEW COMPARISON:  None Available. FINDINGS: Suboptimal visualization due to positioning. No visible fracture, subluxation or dislocation. No visible joint effusion. IMPRESSION: Suboptimal study due to positioning. No visible acute bony abnormality. Electronically Signed   By: Charlett Nose M.D.   On: 08/30/2022 19:13   DG Hip Unilat  With Pelvis 2-3 Views Right  Result Date: 08/30/2022 CLINICAL DATA:  Fall, right hip pain EXAM: DG HIP (WITH OR WITHOUT PELVIS) 2-3V RIGHT  COMPARISON:  09/15/2020 FINDINGS: There is a right basicervical femoral neck fracture. Minimal displacement. No significant angulation. No subluxation or dislocation. Hip joints and SI joints are symmetric. Chronic appearing right inferior pubic ramus fracture. IMPRESSION: Right basicervical femoral neck fracture. Electronically Signed   By: Charlett Nose M.D.   On: 08/30/2022 19:12    Scheduled Meds:  chlorhexidine  60 mL Topical Once   Chlorhexidine Gluconate Cloth  6 each Topical Q0600   donepezil  10 mg Oral QHS   insulin aspart  0-15 Units Subcutaneous Q4H   insulin glargine-yfgn  9 Units Subcutaneous QHS   losartan  50 mg Oral QHS   memantine  10 mg Oral BID   mupirocin ointment  1 Application Nasal BID   povidone-iodine  2 Application Topical Once   rosuvastatin  40 mg Oral Daily   Continuous Infusions:  sodium chloride 100 mL/hr at 08/31/22 2100    ceFAZolin (ANCEF) IV     tranexamic acid       LOS: 2 days    Time spent: 50 mins    Willeen Niece, MD [facility]   If 7PM-7AM, please contact night-coverage

## 2022-09-01 NOTE — Anesthesia Postprocedure Evaluation (Signed)
Anesthesia Post Note  Patient: [name]  Procedure(s) Performed: RIGHT HIP HEMIARTHROPLASTY (Right: Hip) TOE NAIL TRIMMING BILATERALLY (Bilateral: Foot)     Patient location during evaluation: PACU Anesthesia Type: General Level of consciousness: awake and alert Pain management: pain level controlled Vital Signs Assessment: post-procedure vital signs reviewed and stable Respiratory status: spontaneous breathing, nonlabored ventilation, respiratory function stable and patient connected to nasal cannula oxygen Cardiovascular status: blood pressure returned to baseline and stable Postop Assessment: no apparent nausea or vomiting Anesthetic complications: no   No notable events documented.  Last Vitals:  Vitals:   09/01/22 0815 09/01/22 1415  BP: (!) 141/78 137/61  Pulse: 89 83  Resp: 17 18  Temp: 36.9 C 36.8 C  SpO2: 98% 98%    Last Pain:  Vitals:   09/01/22 1415  TempSrc: Oral  PainSc: 4                  Collene Schlichter

## 2022-09-01 NOTE — TOC Initial Note (Addendum)
Transition of Care [facility]) - Initial/Assessment Note    Patient Details  Name: [name] [name] MRN: [id number] Date of Birth: [birth year]  Transition of Care [facility]) CM/SW Contact:    Epifanio Lesches, RN Phone Number: 09/01/2022, 10:00 AM  Clinical Narrative:                 Admitted after a fall. Suffered a R hip fx. Pt from home with wife and adult son. Pt with hx of dementia( short term memory loss). PTA wife and son would assist with ADL's if needed. Pt ambulatory used a cane for ambulation prior to injury. Has a RW @ home. Wife states  they reside in a one level home, one step to enter home. NCM spoke with wife regarding d/c planning. Wife states hoping pt will be able to transition to home once discharge. Agreeable to home health services if needed. States has used home health services in the past, couldn't remember [facility] provider. Pt without transportation issues or RX med concerns.  Plan: R hip hemi today  TOC team following for needs....  Expected Discharge Plan: Home w Home Health Services (vs SNF) Barriers to Discharge: Continued Medical Work up   Patient Goals and CMS Choice            Expected Discharge Plan and Services   Discharge Planning Services: CM Consult   Living arrangements for the past 2 months: Single Family Home                                      Prior Living Arrangements/Services Living arrangements for the past 2 months: Single Family Home Lives with:: Spouse Patient language and need for interpreter reviewed:: Yes Do you feel safe going back to the place where you live?: Yes      Need for Family Participation in Patient Care: Yes (Comment) Care giver support system in place?: Yes (comment) Current home services: DME (Cane and walker) Criminal Activity/Legal Involvement Pertinent to Current Situation/Hospitalization: No - Comment as needed  Activities of Daily Living      Permission Sought/Granted                  Emotional  Assessment Appearance:: Appears stated age       Alcohol / Substance Use: Not Applicable Psych Involvement: No (comment)  Admission diagnosis:  Hip fracture (HCC) [S72.009A] Hyperglycemia [R73.9] Fall in home, initial encounter [W19.XXXA, Y92.009] Closed displaced fracture of right femoral neck (HCC) [S72.001A] Skin tear of right elbow without complication, initial encounter [S51.011A] Patient Active Problem List   Diagnosis Date Noted   Dehydration 08/31/2022   Dementia (HCC) 08/31/2022   Fall at home, initial encounter 08/31/2022   Hip fracture (HCC) 08/30/2022   Vitamin D deficiency 05/17/2020   Cerebrovascular accident (CVA) (HCC) 05/17/2020   Carotid stenosis 06/15/2016   Carotid disease, bilateral (HCC) 04/12/2015   History of colonic polyps    Iron deficiency anemia 12/21/2013   Smoker 12/21/2013   Anxiety    Diabetes mellitus (HCC)    Diabetes mellitus type 2, uncomplicated (HCC)    Hyperlipemia    Hypertension    Cellulitis of foot, left 08/09/2010   PCP:  Donita Brooks, MD Pharmacy:   [facility] [phone] - [place], [place] - [name]  [place] [address] Phone: [phone]  Fax: [phone]     Social Determinants of Health (SDOH) Social History: SDOH Screenings   Food Insecurity: No Food Insecurity (11/05/2021)  Housing: Low Risk  (11/05/2021)  Transportation Needs: No Transportation Needs (11/05/2021)  Utilities: Not At Risk (11/05/2021)  Alcohol Screen: Low Risk  (11/05/2021)  Depression (PHQ2-9): High Risk (11/05/2021)  Financial Resource Strain: Low Risk  (11/05/2021)  Physical Activity: Insufficiently Active (11/05/2021)  Social Connections: Moderately Integrated (11/05/2021)  Stress: No Stress Concern Present (11/05/2021)  Tobacco Use: Medium Risk (08/30/2022)   SDOH Interventions:     Readmission Risk Interventions     No data to display

## 2022-09-01 NOTE — TOC CAGE-AID Note (Signed)
Transition of Care [facility]) - CAGE-AID Screening   Patient Details  Name: [name] MRN: [id number] Date of Birth: [birth year]  Transition of Care [facility]) CM/SW Contact:    Leota Sauers, RN Phone Number: 09/01/2022, 6:35 AM   Clinical Narrative:  Patient denies use of alcohol and illicit drugs. Resources not given at this time.   CAGE-AID Screening:    Have You Ever Felt You Ought to Cut Down on Your Drinking or Drug Use?: No Have People Annoyed You By Critizing Your Drinking Or Drug Use?: No Have You Felt Bad Or Guilty About Your Drinking Or Drug Use?: No Have You Ever Had a Drink or Used Drugs First Thing In The Morning to Steady Your Nerves or to Get Rid of a Hangover?: No CAGE-AID Score: 0  Substance Abuse Education Offered: No

## 2022-09-01 NOTE — Transfer of Care (Signed)
Immediate Anesthesia Transfer of Care Note  Patient: [name] [name]  Procedure(s) Performed: RIGHT HIP HEMIARTHROPLASTY (Right: Hip) TOE NAIL TRIMMING BILATERALLY (Bilateral: Foot)  Patient Location: PACU  Anesthesia Type:General  Level of Consciousness: drowsy  Airway & Oxygen Therapy: Patient Spontanous Breathing and Patient connected to nasal cannula oxygen  Post-op Assessment: Report given to RN, Post -op Vital signs reviewed and stable, and Patient moving all extremities  Post vital signs: Reviewed and stable  Last Vitals:  Vitals Value Taken Time  BP 127/72 09/01/22 1915  Temp    Pulse 89 09/01/22 1920  Resp 17 09/01/22 1920  SpO2 100 % 09/01/22 1920  Vitals shown include unfiled device data.  Last Pain:  Vitals:   09/01/22 1415  TempSrc: Oral  PainSc: 4          Complications: No notable events documented.

## 2022-09-01 NOTE — Anesthesia Preprocedure Evaluation (Addendum)
Anesthesia Evaluation  Patient identified by MRN, date of birth, ID band Patient confused    Reviewed: Allergy & Precautions, NPO status , Patient's Chart, lab work & pertinent test results  Airway Mallampati: III  TM Distance: >3 FB Neck ROM: Full    Dental  (+) Missing   Pulmonary Patient abstained from smoking., former smoker   Pulmonary exam normal        Cardiovascular hypertension, Pt. on medications Normal cardiovascular exam     Neuro/Psych  PSYCHIATRIC DISORDERS Anxiety    Dementia CVA, Residual Symptoms    GI/Hepatic negative GI ROS, Neg liver ROS,,,  Endo/Other  diabetes, Insulin Dependent    Renal/GU negative Renal ROS     Musculoskeletal  (+) Arthritis ,    Abdominal   Peds  Hematology negative hematology ROS (+)   Anesthesia Other Findings Right Hip Fracture  Reproductive/Obstetrics                             Anesthesia Physical Anesthesia Plan  ASA: 3  Anesthesia Plan: General   Post-op Pain Management:    Induction: Intravenous  PONV Risk Score and Plan: 2 and Ondansetron, Dexamethasone and Treatment may vary due to age or medical condition  Airway Management Planned: Oral ETT  Additional Equipment:   Intra-op Plan:   Post-operative Plan: Extubation in OR  Informed Consent: I have reviewed the patients History and Physical, chart, labs and discussed the procedure including the risks, benefits and alternatives for the proposed anesthesia with the patient or authorized representative who has indicated his/her understanding and acceptance.     Dental advisory given and Consent reviewed with POA  Plan Discussed with: CRNA  Anesthesia Plan Comments: (Anesthetic plan discussed with wife)        Anesthesia Quick Evaluation

## 2022-09-01 NOTE — Plan of Care (Signed)
  Problem: Education: Goal: Ability to describe self-care measures that may prevent or decrease complications (Diabetes Survival Skills Education) will improve Outcome: Progressing   Problem: Education: Goal: Knowledge of General Education information will improve Description: Including pain rating scale, medication(s)/side effects and non-pharmacologic comfort measures Outcome: Progressing   Problem: Health Behavior/Discharge Planning: Goal: Ability to manage health-related needs will improve Outcome: Progressing   Problem: Clinical Measurements: Goal: Ability to maintain clinical measurements within normal limits will improve Outcome: Progressing   Problem: Nutrition: Goal: Adequate nutrition will be maintained Outcome: Progressing   Problem: Elimination: Goal: Will not experience complications related to bowel motility Outcome: Progressing   Problem: Pain Managment: Goal: General experience of comfort will improve Outcome: Progressing

## 2022-09-02 ENCOUNTER — Encounter (HOSPITAL_COMMUNITY): Payer: Self-pay | Admitting: Orthopedic Surgery

## 2022-09-02 DIAGNOSIS — E860 Dehydration: Secondary | ICD-10-CM | POA: Diagnosis not present

## 2022-09-02 DIAGNOSIS — S72001A Fracture of unspecified part of neck of right femur, initial encounter for closed fracture: Secondary | ICD-10-CM | POA: Diagnosis not present

## 2022-09-02 DIAGNOSIS — W19XXXA Unspecified fall, initial encounter: Secondary | ICD-10-CM | POA: Diagnosis not present

## 2022-09-02 DIAGNOSIS — E785 Hyperlipidemia, unspecified: Secondary | ICD-10-CM | POA: Diagnosis not present

## 2022-09-02 LAB — CBC
HCT: 33.4 % — ABNORMAL LOW (ref 39.0–52.0)
Hemoglobin: 11.1 g/dL — ABNORMAL LOW (ref 13.0–17.0)
MCH: 31.1 pg (ref 26.0–34.0)
MCHC: 33.2 g/dL (ref 30.0–36.0)
MCV: 93.6 fL (ref 80.0–100.0)
Platelets: 180 10*3/uL (ref 150–400)
RBC: 3.57 MIL/uL — ABNORMAL LOW (ref 4.22–5.81)
RDW: 13.1 % (ref 11.5–15.5)
WBC: 11.8 10*3/uL — ABNORMAL HIGH (ref 4.0–10.5)
nRBC: 0 % (ref 0.0–0.2)

## 2022-09-02 LAB — BASIC METABOLIC PANEL
Anion gap: 10 (ref 5–15)
BUN: 19 mg/dL (ref 8–23)
CO2: 25 mmol/L (ref 22–32)
Calcium: 8.7 mg/dL — ABNORMAL LOW (ref 8.9–10.3)
Chloride: 102 mmol/L (ref 98–111)
Creatinine, Ser: 0.92 mg/dL (ref 0.61–1.24)
GFR, Estimated: >60 mL/min (ref >60)
Glucose, Bld: 218 mg/dL — ABNORMAL HIGH (ref 70–99)
Potassium: 3.9 mmol/L (ref 3.5–5.1)
Sodium: 137 mmol/L (ref 135–145)

## 2022-09-02 LAB — GLUCOSE, CAPILLARY
Glucose-Capillary: 204 mg/dL — ABNORMAL HIGH (ref 70–99)
Glucose-Capillary: 144 mg/dL — ABNORMAL HIGH (ref 70–99)
Glucose-Capillary: 144 mg/dL — ABNORMAL HIGH (ref 70–99)
Glucose-Capillary: 147 mg/dL — ABNORMAL HIGH (ref 70–99)
Glucose-Capillary: 264 mg/dL — ABNORMAL HIGH (ref 70–99)

## 2022-09-02 LAB — VITAMIN D 25 HYDROXY (VIT D DEFICIENCY, FRACTURES): Vit D, 25-Hydroxy: 76.03 ng/mL (ref 30–100)

## 2022-09-02 NOTE — Progress Notes (Signed)
PROGRESS NOTE    [name] [name]  KZS:[id number] DOB: [birth year] DOA: 08/30/2022 PCP: Donita Brooks, MD   Brief Narrative:  This 82 year old male with PMH significant of anxiety, dementia, GERD, hyperlipidemia, hypertension, type 2 diabetes presented to the ED s/p mechanical fall.  He is found to have right hip pain and right elbow pain.  He could not bear weight after his fall.  X-ray right hip showed basiccervical femoral neck fracture.  Orthopedics is consulted, Hospitalist called for admission.    Assessment & Plan:   Principal Problem:   Hip fracture (HCC) Active Problems:   Diabetes mellitus type 2, uncomplicated (HCC)   Hyperlipemia   Hypertension   Dehydration   Dementia (HCC)   Fall at home, initial encounter   Right Hip fracture Ambulatory dysfunction/fall Intractable hip pain(improving): Patient presented status post mechanical fall. Right hip x-ray showed basicervical femoral neck fracture. Ortho consulted , ORIF 8/20. Tolerated well PT and OT evaluation ongoing - plan for DC home with family per their request. Discussed that if patient is unsafe to dispo home we would need to discuss SNF.   Leukocytosis, reactive Downtrending appropriately Peri-operative antibiotics only for now  Dementia (HCC) Continue Namenda and Aricept    Hypertension : Continue ARB  Hyperlipemia Continue Crestor   Diabetes mellitus type 2, uncontrolled with hyperglycemia,poa Last A1c 9.5 Markedly elevated at intake >500 Continue sliding scale/hold long acting/PO meds while NPO perioperatively  DVT prophylaxis: enoxaparin (LOVENOX) injection 40 mg Start: 09/02/22 0800 SCDs Start: 09/01/22 2034 SCDs Start: 08/30/22 2301 Code Status:   Code Status: Full Code Family Communication: Wife at bedside  Status is: Inpt  Dispo: The patient is from: Home              Anticipated d/c is to: TBD              Anticipated d/c date is: 48-72h              Patient currently NOT medically  stable for discharge given ongoing need for pain control and PT evaluation post operatively  Consultants:  Ortho  Procedures:  ORIF 8/20  Antimicrobials:  Peri-operatively   Subjective: No acute issue/events overnight -ROS limited but patient indicates his pain is currently well controlled in bedside chair.  Objective: Vitals:   09/01/22 2207 09/01/22 2237 09/02/22 0006 09/02/22 0430  BP: (!) 137/59 (!) 124/59  (!) 121/58  Pulse: 82  84 90  Resp: 16  16 16   Temp: 97.9 F (36.6 C)  97.8 F (36.6 C) 97.9 F (36.6 C)  TempSrc: Oral  Oral Oral  SpO2: 96%  98% 100%  Weight:      Height:        Intake/Output Summary (Last 24 hours) at 09/02/2022 0708 Last data filed at 09/02/2022 0300 Gross per 24 hour  Intake 3802.17 ml  Output 1050 ml  Net 2752.17 ml   Filed Weights   08/30/22 1713  Weight: 63.5 kg    Examination:  General exam: Appears calm and comfortable  Respiratory system: Clear to auscultation. Respiratory effort normal. Cardiovascular system: S1 & S2 heard, RRR. No JVD, murmurs, rubs, gallops or clicks. No pedal edema. Gastrointestinal system: Abdomen is nondistended, soft and nontender. No organomegaly or masses felt. Normal bowel sounds heard. Central nervous system: Alert and oriented. No focal neurological deficits. Extremities: Symmetric 5 x 5 power other than RLE limited by pain.   Data Reviewed: I have personally reviewed following labs and imaging studies  CBC:  Recent Labs  Lab 08/30/22 2002 09/01/22 2201  WBC 15.6* 13.9*  NEUTROABS 13.6*  --   HGB 13.0 12.3*  HCT 40.0 37.6*  MCV 96.2 95.2  PLT 200 137*   Basic Metabolic Panel: Recent Labs  Lab 08/30/22 2002 09/01/22 2201  NA 132*  --   K 4.1  --   CL 97*  --   CO2 23  --   GLUCOSE 526*  --   BUN 32*  --   CREATININE 0.96 0.75  CALCIUM 9.2  --    GFR: Estimated Creatinine Clearance: 65 mL/min (by C-G formula based on SCr of 0.75 mg/dL). Liver Function Tests: No results for  input(s): "AST", "ALT", "ALKPHOS", "BILITOT", "PROT", "ALBUMIN" in the last 168 hours. No results for input(s): "LIPASE", "AMYLASE" in the last 168 hours. No results for input(s): "AMMONIA" in the last 168 hours. Coagulation Profile: Recent Labs  Lab 08/30/22 2002  INR 1.0   Cardiac Enzymes: No results for input(s): "CKTOTAL", "CKMB", "CKMBINDEX", "TROPONINI" in the last 168 hours. BNP (last 3 results) No results for input(s): "PROBNP" in the last 8760 hours. HbA1C: No results for input(s): "HGBA1C" in the last 72 hours. CBG: Recent Labs  Lab 09/01/22 1206 09/01/22 1420 09/01/22 1920 09/01/22 2339 09/02/22 0353  GLUCAP 131* 112* 108* 241* 204*   Lipid Profile: No results for input(s): "CHOL", "HDL", "LDLCALC", "TRIG", "CHOLHDL", "LDLDIRECT" in the last 72 hours. Thyroid Function Tests: No results for input(s): "TSH", "T4TOTAL", "FREET4", "T3FREE", "THYROIDAB" in the last 72 hours. Anemia Panel: No results for input(s): "VITAMINB12", "FOLATE", "FERRITIN", "TIBC", "IRON", "RETICCTPCT" in the last 72 hours. Sepsis Labs: No results for input(s): "PROCALCITON", "LATICACIDVEN" in the last 168 hours.  Recent Results (from the past 240 hour(s))  Surgical pcr screen     Status: Abnormal   Collection Time: 08/31/22  1:20 PM   Specimen: Nasal Mucosa; Nasal Swab  Result Value Ref Range Status   MRSA, PCR NEGATIVE NEGATIVE Final   Staphylococcus aureus POSITIVE (A) NEGATIVE Final    Comment: (NOTE) The Xpert SA Assay (FDA approved for NASAL specimens in patients 54 years of age and older), is one component of a comprehensive surveillance program. It is not intended to diagnose infection nor to guide or monitor treatment. Performed at [facility], [address] [place], [place] [address]          Radiology Studies: DG Hip Port Unilat With Pelvis 1V Right  Result Date: 09/01/2022 CLINICAL DATA:  Femoral neck fracture EXAM: DG HIP (WITH OR WITHOUT PELVIS) 1V PORT  RIGHT COMPARISON:  Right hip x-ray 08/30/2022 FINDINGS: There is a new right hip arthroplasty in anatomic alignment. There is lateral hip soft tissue swelling and air compatible with recent surgery. No acute fractures are seen. There is a healed right inferior pubic ramus fracture. No dislocation. There are mild degenerative changes of the left hip. IMPRESSION: Right hip arthroplasty in anatomic alignment. Electronically Signed   By: Darliss Cheney M.D.   On: 09/01/2022 19:49   DG Knee Right Port  Result Date: 08/31/2022 CLINICAL DATA:  Femoral neck fracture., Recent fall. EXAM: PORTABLE RIGHT KNEE - 1-2 VIEW COMPARISON:  None Available. FINDINGS: No evidence of fracture, dislocation, or joint effusion. Minor degenerative change for age with peripheral spurring. No erosion or focal bone abnormality. Soft tissues are unremarkable. IMPRESSION: No fracture or subluxation of the right knee. Minor degenerative change. Electronically Signed   By: Narda Rutherford M.D.   On: 08/31/2022 15:08  Scheduled Meds:  acetaminophen  650 mg Oral Q8H   Or   acetaminophen  650 mg Rectal Q8H   Chlorhexidine Gluconate Cloth  6 each Topical Q0600   docusate sodium  100 mg Oral BID   donepezil  10 mg Oral QHS   enoxaparin (LOVENOX) injection  40 mg Subcutaneous Q24H   insulin aspart  0-15 Units Subcutaneous Q4H   insulin glargine-yfgn  9 Units Subcutaneous QHS   losartan  50 mg Oral QHS   memantine  10 mg Oral BID   mupirocin ointment  1 Application Nasal BID   rosuvastatin  40 mg Oral Daily   Continuous Infusions:  sodium chloride 100 mL/hr at 09/02/22 0139     LOS: 3 days   Time spent:  Azucena Fallen, DO [facility]  If 7PM-7AM, please contact night-coverage [email or web address]  09/02/2022, 7:08 AM

## 2022-09-02 NOTE — Progress Notes (Signed)
SLP Cancellation Note  Patient Details Name: [name] [name] MRN: [id number] DOB: 1940-02-16   Cancelled treatment:       Reason Eval/Treat Not Completed: SLP screened, no needs identified, will sign off. Upon entrance to room, Dr. Natale Milch stated pt doing well with breakfast and no concerns re: swallowing. Observed pt briefly and spoke with pt and wife. Pt consumes a regular diet with well cut solids at home. Pt and wife deny pt with s/sx pharyngeal dysphagia. Will amend diet to regular consistency with well cut solids. DO aware.  Clyde Canterbury, M.S., CCC-SLP Speech-Language Pathologist Secure Chat Preferred  O: [phone]  Woodroe Chen 09/02/2022, 9:22 AM

## 2022-09-02 NOTE — Evaluation (Signed)
Occupational Therapy Evaluation Patient Details Name: [name] [name] MRN: [id number] DOB: 02/04/1940 Today's Date: 09/02/2022   History of Present Illness Pt is an 82 year old man admitted on 8/18 after a fall in which he fractured his R hip and injured his R elbow. Underwent R hip hemiarthroplasty on 8/20. PMH: dementia, anxiety, arthritis, HLD, HTN, DM2, CVA.   Clinical Impression   Pt typically walks with a cane and can self feed with set up. He is assisted for all ADLs and IADLs. Pt presents with post operative pain, generalized weakness and poor balance. He is agitated at times, his wife is well versed in managing his agitation. Pt requires +2 mod assist for transfers and demonstrated ability to self feed and wash his hands with set up. Wife educated in posterior hip precautions. She prefers to take him home. Recommending HHOT.      If plan is discharge home, recommend the following: Two people to help with walking and/or transfers;Two people to help with bathing/dressing/bathroom;Assistance with cooking/housework;Direct supervision/assist for medications management;Direct supervision/assist for financial management;Assist for transportation;Help with stairs or ramp for entrance    Functional Status Assessment  Patient has had a recent decline in their functional status and demonstrates the ability to make significant improvements in function in a reasonable and predictable amount of time.  Equipment Recommendations  None recommended by OT    Recommendations for Other Services       Precautions / Restrictions Precautions Precautions: Fall;Posterior Hip Precaution Booklet Issued: Yes (comment) Precaution Comments: educated wife in posterior hip precautions and provided written handout Required Braces or Orthoses: Knee Immobilizer - Right Restrictions Weight Bearing Restrictions: Yes RLE Weight Bearing: Weight bearing as tolerated      Mobility Bed Mobility Overal bed mobility:  Needs Assistance Bed Mobility: Supine to Sit     Supine to sit: +2 for physical assistance, Max assist     General bed mobility comments: HOB up, assist for LEs over EOB and to raise trunk    Transfers Overall transfer level: Needs assistance Equipment used: Rolling walker (2 wheels) Transfers: Sit to/from Stand, Bed to chair/wheelchair/BSC Sit to Stand: +2 physical assistance, Mod assist, From elevated surface     Step pivot transfers: +2 physical assistance, Mod assist     General transfer comment: assist to rise and steady, KI donned      Balance Overall balance assessment: Needs assistance   Sitting balance-Leahy Scale: Fair     Standing balance support: Bilateral upper extremity supported Standing balance-Leahy Scale: Poor                             ADL either performed or assessed with clinical judgement   ADL Overall ADL's : Needs assistance/impaired Eating/Feeding: Set up;Sitting   Grooming: Wash/dry hands;Sitting;Set up   Upper Body Bathing: Moderate assistance;Sitting   Lower Body Bathing: Total assistance;+2 for physical assistance;Sit to/from stand   Upper Body Dressing : Minimal assistance;Sitting   Lower Body Dressing: Total assistance;+2 for physical assistance;Sit to/from stand   Toilet Transfer: +2 for physical assistance;Moderate assistance;Stand-pivot;Rolling walker (2 wheels);BSC/3in1   Toileting- Clothing Manipulation and Hygiene: Total assistance;+2 for physical assistance;Sit to/from stand               Vision Baseline Vision/History: 1 Wears glasses Ability to See in Adequate Light: 0 Adequate Patient Visual Report: No change from baseline       Perception  Praxis         Pertinent Vitals/Pain Pain Assessment Pain Assessment: Faces Faces Pain Scale: Hurts little more Pain Location: R hip Pain Descriptors / Indicators: Guarding, Grimacing, Discomfort Pain Intervention(s): Limited activity within  patient's tolerance, Monitored during session     Extremity/Trunk Assessment Upper Extremity Assessment Upper Extremity Assessment: Overall WFL for tasks assessed   Lower Extremity Assessment Lower Extremity Assessment: Defer to PT evaluation   Cervical / Trunk Assessment Cervical / Trunk Assessment: Kyphotic   Communication Communication Cueing Techniques: Verbal cues;Tactile cues   Cognition Arousal: Alert Behavior During Therapy: Agitated (intermittently) Overall Cognitive Status: History of cognitive impairments - at baseline                                       General Comments       Exercises     Shoulder Instructions      Home Living Family/patient expects to be discharged to:: Private residence Living Arrangements: Spouse/significant other;Children Available Help at Discharge: Family;Available 24 hours/day Type of Home: House Home Access: Stairs to enter [employer] of Steps: 1   Home Layout: One level     Bathroom Shower/Tub: Producer, television/film/video: Standard     Home Equipment: Agricultural consultant (2 wheels);Hand held shower head;Shower seat;Cane - single point;Grab bars - tub/shower;Wheelchair - manual          Prior Functioning/Environment Prior Level of Function : Needs assist             Mobility Comments: walks with a cane ADLs Comments: self feed, otherwise assisted for bathing, dressing, toileting and all IADLs        OT Problem List: Decreased strength;Decreased activity tolerance;Impaired balance (sitting and/or standing);Decreased safety awareness;Decreased knowledge of precautions;Pain;Decreased cognition      OT Treatment/Interventions: Self-care/ADL training;DME and/or AE instruction;Therapeutic activities;Patient/family education;Balance training    OT Goals(Current goals can be found in the care plan section) Acute Rehab OT Goals OT Goal Formulation: With patient Time For Goal Achievement:  09/16/22 Potential to Achieve Goals: Good ADL Goals Pt Will Transfer to Toilet: with mod assist;ambulating;bedside commode Additional ADL Goal #1: Pt will perform bed mobility with min assist in preparation for ADLs with wife assisting. Additional ADL Goal #2: Wife will be knowledgeable in posterior hip precautions during ADLs and mobility.  OT Frequency: Min 1X/week    Co-evaluation PT/OT/SLP Co-Evaluation/Treatment: Yes Reason for Co-Treatment: Necessary to address cognition/behavior during functional activity;For patient/therapist safety   OT goals addressed during session: ADL's and self-care      AM-PAC OT "6 Clicks" Daily Activity     Outcome Measure Help from another person eating meals?: A Little Help from another person taking care of personal grooming?: A Little Help from another person toileting, which includes using toliet, bedpan, or urinal?: Total Help from another person bathing (including washing, rinsing, drying)?: A Lot Help from another person to put on and taking off regular upper body clothing?: A Little Help from another person to put on and taking off regular lower body clothing?: Total 6 Click Score: 13   End of Session Equipment Utilized During Treatment: Rolling walker (2 wheels);Gait belt;Right knee immobilizer Nurse Communication: Precautions;Mobility status;Patient requests pain meds  Activity Tolerance: Patient tolerated treatment well Patient left: in chair;with call bell/phone within reach;with chair alarm set;with family/visitor present  OT Visit Diagnosis: Unsteadiness on feet (R26.81);Other abnormalities of gait and mobility (  R26.89);Pain;Muscle weakness (generalized) (M62.81);Other symptoms and signs involving cognitive function                Time: 5621-3086 OT Time Calculation (min): 26 min Charges:  OT General Charges $OT Visit: 1 Visit OT Evaluation $OT Eval Moderate Complexity: 1 Mod  [name] [name], OTR/L Acute Rehabilitation  Services Office: [phone]   Evern Bio 09/02/2022, 11:09 AM

## 2022-09-02 NOTE — Evaluation (Signed)
Physical Therapy Evaluation Patient Details Name: [name] [name] MRN: [id number] DOB: 09/05/1940 Today's Date: 09/02/2022  History of Present Illness  Pt is an 82 year old man admitted on 8/18 after a fall in which he fractured his R hip and injured his R elbow. Underwent R hip hemiarthroplasty on 8/20. PMH: dementia, anxiety, arthritis, HLD, HTN, DM2, CVA.  Clinical Impression   Pt admitted with above diagnosis. Lives at home with wife, in a single-level home with one step to enter; Pt typically walks with a cane and can self feed with set up. He is assisted for all ADLs and IADLs. Pt presents with post operative pain, generalized weakness and poor balance. He is agitated at times, especially with pain, but his wife is well versed in managing his agitation. Pt requires +2 mod assist for transfers; Given WBAT, he has good rehab potential for ambulating with RW; Wife educated in Post Hip Prec; She prefers to bring him home withn medically stable; In considering options for discharge, I value going back to familiar environment, caregivers, and routines for patients with dementia, as well; Rec HHPT/OT and potentially an Aide;  Pt currently with functional limitations due to the deficits listed below (see PT Problem List). Pt will benefit from skilled PT to increase their independence and safety with mobility to allow discharge to the venue listed below.           If plan is discharge home, recommend the following: A lot of help with walking and/or transfers;A lot of help with bathing/dressing/bathroom;Assist for transportation;Help with stairs or ramp for entrance;Supervision due to cognitive status   Can travel by private vehicle        Equipment Recommendations Rolling walker (2 wheels);BSC/3in1;Wheelchair (measurements PT);Wheelchair cushion (measurements PT);Other (comment) (Already well-equipped -- may have above DME)  Recommendations for Other Services  OT consult (as ordered)    Functional  Status Assessment Patient has had a recent decline in their functional status and demonstrates the ability to make significant improvements in function in a reasonable and predictable amount of time.     Precautions / Restrictions Precautions Precautions: Fall;Posterior Hip Precaution Booklet Issued: Yes (comment) Precaution Comments: educated wife in posterior hip precautions and provided written handout Required Braces or Orthoses: Knee Immobilizer - Right Restrictions Weight Bearing Restrictions: Yes RLE Weight Bearing: Weight bearing as tolerated      Mobility  Bed Mobility Overal bed mobility: Needs Assistance Bed Mobility: Supine to Sit     Supine to sit: +2 for physical assistance, Max assist     General bed mobility comments: HOB up, assist for LEs over EOB and to raise trunk    Transfers Overall transfer level: Needs assistance Equipment used: Rolling walker (2 wheels) Transfers: Sit to/from Stand, Bed to chair/wheelchair/BSC Sit to Stand: +2 physical assistance, Mod assist, From elevated surface   Step pivot transfers: +2 physical assistance, Mod assist       General transfer comment: assist to rise and steady, KI donned    Ambulation/Gait                  Stairs            Wheelchair Mobility     Tilt Bed    Modified Rankin (Stroke Patients Only)       Balance     Sitting balance-Leahy Scale: Fair       Standing balance-Leahy Scale: Poor  Pertinent Vitals/Pain Pain Assessment Pain Assessment: Faces Faces Pain Scale: Hurts little more Pain Location: R hip Pain Descriptors / Indicators: Guarding, Grimacing, Discomfort Pain Intervention(s): Limited activity within patient's tolerance    Home Living Family/patient expects to be discharged to:: Private residence Living Arrangements: Spouse/significant other;Children Available Help at Discharge: Family;Available 24 hours/day Type of  Home: House Home Access: Stairs to enter   [employer] of Steps: 1   Home Layout: One level Home Equipment: Agricultural consultant (2 wheels);Hand held shower head;Shower seat;Cane - single point;Grab bars - tub/shower;Wheelchair - manual      Prior Function Prior Level of Function : Needs assist             Mobility Comments: walks with a cane ADLs Comments: self feed, otherwise assisted for bathing, dressing, toileting and all IADLs     Extremity/Trunk Assessment   Upper Extremity Assessment Upper Extremity Assessment: Defer to OT evaluation    Lower Extremity Assessment Lower Extremity Assessment: RLE deficits/detail RLE Deficits / Details: Grossly decr AROM and strength, limited by pain post fracture and surgical fixation; decr tolerance of hip movement, but able to accept some weight in standing RLE: Unable to fully assess due to pain    Cervical / Trunk Assessment Cervical / Trunk Assessment: Kyphotic  Communication   Communication Cueing Techniques: Verbal cues;Gestural cues;Tactile cues  Cognition Arousal: Alert Behavior During Therapy: Agitated (intermittently) Overall Cognitive Status: History of cognitive impairments - at baseline                                          General Comments General comments (skin integrity, edema, etc.): Educated pt's wife re: post hip precautions    Exercises     Assessment/Plan    PT Assessment Patient needs continued PT services  PT Problem List Decreased strength;Decreased range of motion;Decreased activity tolerance;Decreased balance;Decreased mobility;Decreased coordination;Decreased cognition;Decreased knowledge of use of DME;Decreased safety awareness;Decreased knowledge of precautions;Pain       PT Treatment Interventions DME instruction;Gait training;Stair training;Functional mobility training;Therapeutic activities;Therapeutic exercise;Balance training;Neuromuscular re-education;Cognitive  remediation;Patient/family education;Manual techniques;Wheelchair mobility training    PT Goals (Current goals can be found in the Care Plan section)  Acute Rehab PT Goals Patient Stated Goal: Pt agreeable to getting up; Pt's wife very much hopes to be able to take him home PT Goal Formulation: With patient Time For Goal Achievement: 09/16/22 Potential to Achieve Goals: Good Additional Goals Additional Goal #1: Pt's spouse will be abel to state post hip prec and how they impact functional mobility Additional Goal #2: Pt's wife will demonstrate correct wheelchair parts management, propulsion, and negotiating one step    Frequency Min 1X/week     Co-evaluation PT/OT/SLP Co-Evaluation/Treatment: Yes Reason for Co-Treatment: Necessary to address cognition/behavior during functional activity;For patient/therapist safety PT goals addressed during session: Mobility/safety with mobility OT goals addressed during session: ADL's and self-care       AM-PAC PT "6 Clicks" Mobility  Outcome Measure Help needed turning from your back to your side while in a flat bed without using bedrails?: A Lot Help needed moving from lying on your back to sitting on the side of a flat bed without using bedrails?: Total Help needed moving to and from a bed to a chair (including a wheelchair)?: Total Help needed standing up from a chair using your arms (e.g., wheelchair or bedside chair)?: A Lot Help needed to walk in  hospital room?: Total Help needed climbing 3-5 steps with a railing? : Total 6 Click Score: 8    End of Session Equipment Utilized During Treatment: Gait belt;Right knee immobilizer Activity Tolerance: Patient tolerated treatment well Patient left: in chair;with call bell/phone within reach;with chair alarm set;with family/visitor present Nurse Communication: Mobility status;Precautions;Weight bearing status PT Visit Diagnosis: Unsteadiness on feet (R26.81);Muscle weakness (generalized)  (M62.81);Other abnormalities of gait and mobility (R26.89);Pain Pain - Right/Left: Right Pain - part of body: Hip    Time: 0835-0901 PT Time Calculation (min) (ACUTE ONLY): 26 min   Charges:   PT Evaluation $PT Eval Moderate Complexity: 1 Mod   PT General Charges $$ ACUTE PT VISIT: 1 Visit         Van Clines, PT  Acute Rehabilitation Services Office [phone] Secure Chat welcomed   Levi Aland 09/02/2022, 1:40 PM

## 2022-09-02 NOTE — Plan of Care (Signed)

## 2022-09-02 NOTE — Progress Notes (Signed)
Orthopaedic Trauma Service Progress Note  Patient ID: [name] [name] MRN: [id number] DOB/AGE: [birth year] 81 y.o.  Subjective:  Doing well Wife at bedside Some right hip soreness but not too bad  Uses cane at baseline Family hoping to dc home instead of SNF  Cbc pending for this am   ROS As above  Objective:   VITALS:   Vitals:   09/01/22 2237 09/02/22 0006 09/02/22 0430 09/02/22 0830  BP: (!) 124/59  (!) 121/58 (!) 113/54  Pulse:  84 90 74  Resp:  16 16 16   Temp:  97.8 F (36.6 C) 97.9 F (36.6 C) 97.6 F (36.4 C)  TempSrc:  Oral Oral Oral  SpO2:  98% 100% 99%  Weight:      Height:        Estimated body mass index is 20.67 kg/m as calculated from the following:   Height as of this encounter: 5\' 9"  (1.753 m).   Weight as of this encounter: 63.5 kg.   Intake/Output      08/20 0701 08/21 0700 08/21 0701 08/22 0700   P.O.     I.V. (mL/kg) 3502.2 (55.2)    IV Piggyback 300    Total Intake(mL/kg) 3802.2 (59.9)    Urine (mL/kg/hr) 850 (0.6)    Blood 200    Total Output 1050    Net +2752.2         Urine Occurrence 2 x      LABS  Results for orders placed or performed during the hospital encounter of 08/30/22 (from the past 24 hour(s))  Glucose, capillary     Status: Abnormal   Collection Time: 09/01/22 12:06 PM  Result Value Ref Range   Glucose-Capillary 131 (H) 70 - 99 mg/dL  Glucose, capillary     Status: Abnormal   Collection Time: 09/01/22  2:20 PM  Result Value Ref Range   Glucose-Capillary 112 (H) 70 - 99 mg/dL  Glucose, capillary     Status: Abnormal   Collection Time: 09/01/22  7:20 PM  Result Value Ref Range   Glucose-Capillary 108 (H) 70 - 99 mg/dL  CBC     Status: Abnormal   Collection Time: 09/01/22 10:01 PM  Result Value Ref Range   WBC 13.9 (H) 4.0 - 10.5 K/uL   RBC 3.95 (L) 4.22 - 5.81 MIL/uL   Hemoglobin 12.3 (L) 13.0 - 17.0 g/dL   HCT 40.9 (L) 81.1 - 91.4  %   MCV 95.2 80.0 - 100.0 fL   MCH 31.1 26.0 - 34.0 pg   MCHC 32.7 30.0 - 36.0 g/dL   RDW 78.2 95.6 - 21.3 %   Platelets 137 (L) 150 - 400 K/uL   nRBC 0.0 0.0 - 0.2 %  Creatinine, serum     Status: None   Collection Time: 09/01/22 10:01 PM  Result Value Ref Range   Creatinine, Ser 0.75 0.61 - 1.24 mg/dL   GFR, Estimated >08 >65 mL/min  Glucose, capillary     Status: Abnormal   Collection Time: 09/01/22 11:39 PM  Result Value Ref Range   Glucose-Capillary 241 (H) 70 - 99 mg/dL  Glucose, capillary     Status: Abnormal   Collection Time: 09/02/22  3:53 AM  Result Value Ref Range   Glucose-Capillary 204 (H) 70 - 99 mg/dL  Glucose, capillary  Status: Abnormal   Collection Time: 09/02/22  8:06 AM  Result Value Ref Range   Glucose-Capillary 144 (H) 70 - 99 mg/dL     PHYSICAL EXAM:   Gen: sitting up in chair, looks good, NAD Lungs: unlabored Ext:       Right Lower Extremity   Dressing R hip with scant strikethrough but o/w intact  Ext warm   Knee immobilizer removed by myself  Distal motor and sensory functions intact including ankle extension and EHL  No DCT   Compartments are soft    Assessment/Plan: 1 Day Post-Op   Principal Problem:   Hip fracture (HCC) Active Problems:   Diabetes mellitus type 2, uncomplicated (HCC)   Hyperlipemia   Hypertension   Dehydration   Dementia (HCC)   Fall at home, initial encounter   Anti-infectives (From admission, onward)    Start     Dose/Rate Route Frequency Ordered Stop   09/01/22 2130  ceFAZolin (ANCEF) IVPB 2g/100 mL premix        2 g 200 mL/hr over 30 Minutes Intravenous Every 6 hours 09/01/22 2034 09/02/22 0458   09/01/22 1831  vancomycin (VANCOCIN) powder  Status:  Discontinued          As needed 09/01/22 1832 09/01/22 1910   09/01/22 0600  ceFAZolin (ANCEF) IVPB 2g/100 mL premix        2 g 200 mL/hr over 30 Minutes Intravenous On call to O.R. 08/31/22 1855 09/01/22 1646     .  POD/HD#: 1  82 y/o male s/p  fall with right femoral neck fracture   S/p R hip hemiarthroplasty for right femoral neck fracture    Weightbearing: WBAT RLE with walker and assistance  ROM: posterior hip precautions right hip  Insicional and dressing care: Daily dressing changes with 4x4 gauze and tape or silicone foam dressing starting on 09/04/2022 Pain management: multimodal, minimize narcotics  YQ:[id number] abx  Impediments to Fracture Healing: fracture indicative of fragility fracture. Recommend DEXA as outpt  Bone Health/Optimization: continue with home vitamin D   Orthopedic device(s):  walker   Showering: ok to bathe and clean wound with soap and water once there is no drainage  VTE prophylaxis: Lovenox 40mg  qd  x 30 days  Dispo: PT/OT evals.  Family wants to dc home once stable.  They have adequate help and are concerned about SNF with his dementia   Follow - up plan: 2 weeks  Contact information:  [name] MD, Montez Morita PA-C   Mearl Latin, PA-C [phone] (C) 09/02/2022, 10:32 AM  [facility] [address] [place] [address] [phone] [phone] (F)    After 5pm and on the weekends please log on to Amion, go to orthopaedics and the look under the [facility] Call for the provider(s) on call. You can also call our office at [phone] and then follow the prompts to be connected to the call team.  Patient ID: [name] [name], male   DOB: Jun 09, 1940, 82 y.o.   MRN: [id number]

## 2022-09-03 ENCOUNTER — Other Ambulatory Visit (HOSPITAL_COMMUNITY): Payer: Self-pay

## 2022-09-03 DIAGNOSIS — S72001A Fracture of unspecified part of neck of right femur, initial encounter for closed fracture: Secondary | ICD-10-CM | POA: Diagnosis not present

## 2022-09-03 DIAGNOSIS — E785 Hyperlipidemia, unspecified: Secondary | ICD-10-CM | POA: Diagnosis not present

## 2022-09-03 DIAGNOSIS — W19XXXA Unspecified fall, initial encounter: Secondary | ICD-10-CM | POA: Diagnosis not present

## 2022-09-03 DIAGNOSIS — E860 Dehydration: Secondary | ICD-10-CM | POA: Diagnosis not present

## 2022-09-03 LAB — CBC
HCT: 31.2 % — ABNORMAL LOW (ref 39.0–52.0)
Hemoglobin: 10.1 g/dL — ABNORMAL LOW (ref 13.0–17.0)
MCH: 30.5 pg (ref 26.0–34.0)
MCHC: 32.4 g/dL (ref 30.0–36.0)
MCV: 94.3 fL (ref 80.0–100.0)
Platelets: 198 10*3/uL (ref 150–400)
RBC: 3.31 MIL/uL — ABNORMAL LOW (ref 4.22–5.81)
RDW: 13.1 % (ref 11.5–15.5)
WBC: 9.5 10*3/uL (ref 4.0–10.5)
nRBC: 0 % (ref 0.0–0.2)

## 2022-09-03 LAB — GLUCOSE, CAPILLARY
Glucose-Capillary: 145 mg/dL — ABNORMAL HIGH (ref 70–99)
Glucose-Capillary: 149 mg/dL — ABNORMAL HIGH (ref 70–99)
Glucose-Capillary: 154 mg/dL — ABNORMAL HIGH (ref 70–99)
Glucose-Capillary: 210 mg/dL — ABNORMAL HIGH (ref 70–99)
Glucose-Capillary: 238 mg/dL — ABNORMAL HIGH (ref 70–99)
Glucose-Capillary: 366 mg/dL — ABNORMAL HIGH (ref 70–99)
Glucose-Capillary: 55 mg/dL — ABNORMAL LOW (ref 70–99)
Glucose-Capillary: 56 mg/dL — ABNORMAL LOW (ref 70–99)
Glucose-Capillary: 63 mg/dL — ABNORMAL LOW (ref 70–99)

## 2022-09-03 MED ORDER — DEXTROSE 50 % IV SOLN
12.5000 g | INTRAVENOUS | Status: AC
  Administered 2022-09-03: 12.5 g via INTRAVENOUS

## 2022-09-03 MED ORDER — ACETAMINOPHEN 325 MG PO TABS
650.0000 mg | ORAL_TABLET | Freq: Three times a day (TID) | ORAL | 0 refills | Status: DC | PRN
  Filled 2022-09-03: qty 100, 17d supply, fill #0

## 2022-09-03 MED ORDER — DEXTROSE 50 % IV SOLN
INTRAVENOUS | Status: AC
  Filled 2022-09-03: qty 50

## 2022-09-03 MED ORDER — APIXABAN 2.5 MG PO TABS
2.5000 mg | ORAL_TABLET | Freq: Two times a day (BID) | ORAL | 0 refills | Status: DC
  Filled 2022-09-03: qty 60, 30d supply, fill #0

## 2022-09-03 MED ORDER — HYDROCODONE-ACETAMINOPHEN 5-325 MG PO TABS
1.0000 | ORAL_TABLET | Freq: Four times a day (QID) | ORAL | 0 refills | Status: DC | PRN
  Filled 2022-09-03: qty 40, 10d supply, fill #0

## 2022-09-03 NOTE — Progress Notes (Signed)
Physical Therapy Treatment Patient Details Name: [name] [name] MRN: [id number] DOB: [birth year] Today's Date: 09/03/2022   History of Present Illness Pt is an 82 year old man admitted on 8/18 after a fall in which he fractured his R hip and injured his R elbow. Underwent R hip hemiarthroplasty on 8/20. PMH: dementia, anxiety, arthritis, HLD, HTN, DM2, CVA.    PT Comments  Pt is slowly progressing towards goals. Continues to require Mod A +2 for bed mobility and sit to stand. Pt was able to progress gait this session into hall with chair follow for safety due to cognitive status and physical status. Pt will benefit from W/C level mobility at home with 24/7 supervision until strength improves performing short transfers with family. Pt has one step at home that family will be able to navigate with W/C. Due to current cognitive status, home set up and available assistance at home recommending skilled physical therapy services 3x/weekly on discharge from acute care hospital setting with 24/7 supervision/physical assistance with W/C and transfers in order to decrease risk for falls, injury and re-hospitalization.     If plan is discharge home, recommend the following: Assist for transportation;Help with stairs or ramp for entrance;Supervision due to cognitive status;A lot of help with walking and/or transfers   Can travel by private vehicle        Equipment Recommendations  Rolling walker (2 wheels);BSC/3in1;Wheelchair (measurements PT);Wheelchair cushion (measurements PT);Other (comment)    Recommendations for Other Services       Precautions / Restrictions Precautions Precautions: Fall;Posterior Hip Precaution Comments: pt spouse was educated on posterior hip precautions and provided with written handout at evaluation Restrictions Weight Bearing Restrictions: Yes RLE Weight Bearing: Weight bearing as tolerated     Mobility  Bed Mobility Overal bed mobility: Needs Assistance Bed Mobility:  Supine to Sit     Supine to sit: +2 for physical assistance, Mod assist     General bed mobility comments: Min A at trunk and Mod A at bil LE. Pt is resistant to movement due to pain and difficulty understanding why he is experiencing pain in the hip. Oriented multiple times throughout session that he had surgery.    Transfers Overall transfer level: Needs assistance Equipment used: Rolling walker (2 wheels) Transfers: Sit to/from Stand, Bed to chair/wheelchair/BSC Sit to Stand: +2 physical assistance, Mod assist, From elevated surface   Step pivot transfers: +2 physical assistance, Mod assist       General transfer comment: Mod A +1 and Min A +1 sit to stand from elevated EOB for initial momentum to get to standing EOB with extra time and posterior lean initially that resulted in unsuccessful stand first attempt and successful stand on second attempt.    Ambulation/Gait Ambulation/Gait assistance: Min assist, +2 safety/equipment Gait Distance (Feet): 25 Feet Assistive device: Rolling walker (2 wheels) Gait Pattern/deviations: Step-to pattern, Decreased stance time - right, Decreased step length - left, Narrow base of support Gait velocity: Significantly decreased cadence. Gait velocity interpretation: <1.31 ft/sec, indicative of household ambulator   General Gait Details: Heavy reliance on AD, Min A for ambulation as pt progressed knees began to flex and at 25 ft pt required chair to be behind him due to progressing weakness.   Stairs Stairs:  (did not attempt at this time.)               Balance Overall balance assessment: Needs assistance   Sitting balance-Leahy Scale: Fair     Standing balance support: Bilateral upper  extremity supported Standing balance-Leahy Scale: Poor Standing balance comment: Requires Min A to maintain upright standing balance with RW.        Cognition Arousal: Alert Behavior During Therapy: Agitated (intermittently) Overall  Cognitive Status: History of cognitive impairments - at baseline         General Comments General comments (skin integrity, edema, etc.): Pt incision site is dry and covered without excessive drainage.      Pertinent Vitals/Pain Pain Assessment Pain Assessment: Faces Faces Pain Scale: Hurts little more Breathing: normal Negative Vocalization: none Facial Expression: smiling or inexpressive Body Language: relaxed Consolability: no need to console PAINAD Score: 0 Pain Location: R hip Pain Descriptors / Indicators: Guarding, Grimacing, Discomfort Pain Intervention(s): Limited activity within patient's tolerance, Monitored during session    PT Goals (current goals can now be found in the care plan section) Acute Rehab PT Goals Patient Stated Goal: Pt agreeable to getting up; Pt's wife very much hopes to be able to take him home PT Goal Formulation: With patient Time For Goal Achievement: 09/16/22 Potential to Achieve Goals: Good Additional Goals Additional Goal #1: Pt's spouse will be abel to state post hip prec and how they impact functional mobility Additional Goal #2: Pt's wife will demonstrate correct wheelchair parts management, propulsion, and negotiating one step Progress towards PT goals: Progressing toward goals    Frequency    Min 1X/week      PT Plan  Continue with current POC    Co-evaluation PT/OT/SLP Co-Evaluation/Treatment: Yes Reason for Co-Treatment: Necessary to address cognition/behavior during functional activity;For patient/therapist safety PT goals addressed during session: Mobility/safety with mobility        AM-PAC PT "6 Clicks" Mobility   Outcome Measure  Help needed turning from your back to your side while in a flat bed without using bedrails?: A Lot Help needed moving from lying on your back to sitting on the side of a flat bed without using bedrails?: Total Help needed moving to and from a bed to a chair (including a wheelchair)?:  Total Help needed standing up from a chair using your arms (e.g., wheelchair or bedside chair)?: A Lot Help needed to walk in hospital room?: A Little Help needed climbing 3-5 steps with a railing? : Total 6 Click Score: 10    End of Session Equipment Utilized During Treatment: Gait belt Activity Tolerance: Patient tolerated treatment well Patient left: in chair;with call bell/phone within reach;with chair alarm set Nurse Communication: Mobility status;Precautions PT Visit Diagnosis: Unsteadiness on feet (R26.81);Muscle weakness (generalized) (M62.81);Other abnormalities of gait and mobility (R26.89);Pain Pain - Right/Left: Right Pain - part of body: Hip     Time: 3664-4034 PT Time Calculation (min) (ACUTE ONLY): 27 min  Charges:    $Therapeutic Activity: 8-22 mins PT General Charges $$ ACUTE PT VISIT: 1 Visit                     Harrel Carina, DPT, CLT  Acute Rehabilitation Services Office: [phone] (Secure chat preferred)    Claudia Desanctis 09/03/2022, 10:14 AM

## 2022-09-03 NOTE — Plan of Care (Signed)
  Problem: Education: Goal: Ability to describe self-care measures that may prevent or decrease complications (Diabetes Survival Skills Education) will improve Outcome: Progressing   Problem: Education: Goal: Knowledge of General Education information will improve Description: Including pain rating scale, medication(s)/side effects and non-pharmacologic comfort measures Outcome: Progressing   Problem: Health Behavior/Discharge Planning: Goal: Ability to manage health-related needs will improve Outcome: Progressing   Problem: Activity: Goal: Risk for activity intolerance will decrease Outcome: Progressing   Problem: Nutrition: Goal: Adequate nutrition will be maintained Outcome: Progressing   Problem: Coping: Goal: Level of anxiety will decrease Outcome: Progressing   Problem: Elimination: Goal: Will not experience complications related to bowel motility Outcome: Progressing   Problem: Pain Managment: Goal: General experience of comfort will improve Outcome: Progressing

## 2022-09-03 NOTE — Progress Notes (Signed)
Occupational Therapy Treatment Patient Details Name: [name] [name] MRN: [id number] DOB: 1940-08-24 Today's Date: 09/03/2022   History of present illness Pt is an 82 year old man admitted on 8/18 after a fall in which he fractured his R hip and injured his R elbow. Underwent R hip hemiarthroplasty on 8/20. PMH: dementia, anxiety, arthritis, HLD, HTN, DM2, CVA.   OT comments  With max encouragement and +2 mod assist, pt able to achieve supine to sit and sit to stand from elevated bed with RW. He ambulated with min assist and close chair follow in hall. Pt remained up in chair at end of session for grooming and self feeding breakfast with set up. Continue to recommend HHOT if family is able to manage pt at home with w/c and RW combination for mobility.       If plan is discharge home, recommend the following:  Two people to help with walking and/or transfers;Two people to help with bathing/dressing/bathroom;Assistance with cooking/housework;Direct supervision/assist for medications management;Direct supervision/assist for financial management;Assist for transportation;Help with stairs or ramp for entrance   Equipment Recommendations  None recommended by OT    Recommendations for Other Services      Precautions / Restrictions Precautions Precautions: Fall;Posterior Hip Precaution Comments: pt spouse was educated on posterior hip precautions and provided with written handout at evaluation Restrictions Weight Bearing Restrictions: Yes RLE Weight Bearing: Weight bearing as tolerated       Mobility Bed Mobility Overal bed mobility: Needs Assistance Bed Mobility: Supine to Sit     Supine to sit: +2 for physical assistance, Mod assist     General bed mobility comments: Min A at trunk and Mod A at bil LE. Pt is resistant to movement due to pain and difficulty understanding why he is experiencing pain in the hip. Oriented multiple times throughout session that he had surgery.     Transfers Overall transfer level: Needs assistance Equipment used: Rolling walker (2 wheels) Transfers: Sit to/from Stand Sit to Stand: +2 physical assistance, Mod assist, From elevated surface           General transfer comment: Mod A +1 and Min A +1 sit to stand from elevated EOB for initial momentum to get to standing EOB with extra time and posterior lean initially that resulted in unsuccessful stand first attempt and successful stand on second attempt.     Balance Overall balance assessment: Needs assistance   Sitting balance-Leahy Scale: Fair     Standing balance support: Bilateral upper extremity supported Standing balance-Leahy Scale: Poor                             ADL either performed or assessed with clinical judgement   ADL Overall ADL's : Needs assistance/impaired Eating/Feeding: Set up;Sitting   Grooming: Wash/dry hands;Wash/dry face;Sitting;Set up                               Functional mobility during ADLs: Minimal assistance;+2 for safety/equipment;Rolling walker (2 wheels) (chair follow)      Extremity/Trunk Assessment              Vision       Perception     Praxis      Cognition Arousal: Alert Behavior During Therapy: Agitated (intermittently) Overall Cognitive Status: History of cognitive impairments - at baseline  Exercises      Shoulder Instructions       General Comments Pt incision site is dry and covered without excessive drainage.    Pertinent Vitals/ Pain       Pain Assessment Pain Assessment: Faces Faces Pain Scale: Hurts little more Pain Location: R hip Pain Descriptors / Indicators: Guarding, Grimacing, Discomfort, Sore Pain Intervention(s): Monitored during session, Repositioned  Home Living                                          Prior Functioning/Environment              Frequency  Min 1X/week         Progress Toward Goals  OT Goals(current goals can now be found in the care plan section)  Progress towards OT goals: Progressing toward goals  Acute Rehab OT Goals OT Goal Formulation: With patient Time For Goal Achievement: 09/16/22 Potential to Achieve Goals: Good  Plan      Co-evaluation    PT/OT/SLP Co-Evaluation/Treatment: Yes Reason for Co-Treatment: Necessary to address cognition/behavior during functional activity;For patient/therapist safety PT goals addressed during session: Mobility/safety with mobility OT goals addressed during session: ADL's and self-care      AM-PAC OT "6 Clicks" Daily Activity     Outcome Measure   Help from another person eating meals?: A Little Help from another person taking care of personal grooming?: A Little Help from another person toileting, which includes using toliet, bedpan, or urinal?: Total Help from another person bathing (including washing, rinsing, drying)?: A Lot Help from another person to put on and taking off regular upper body clothing?: A Little Help from another person to put on and taking off regular lower body clothing?: Total 6 Click Score: 13    End of Session Equipment Utilized During Treatment: Gait belt;Rolling walker (2 wheels)  OT Visit Diagnosis: Unsteadiness on feet (R26.81);Other abnormalities of gait and mobility (R26.89);Pain;Muscle weakness (generalized) (M62.81);Other symptoms and signs involving cognitive function Pain - Right/Left: Right   Activity Tolerance Patient tolerated treatment well   Patient Left in chair;with call bell/phone within reach;with chair alarm set   Nurse Communication Mobility status        Time: 9629-5284 OT Time Calculation (min): 32 min  Charges: OT General Charges $OT Visit: 1 Visit OT Treatments $Self Care/Home Management : 8-22 mins  Berna Spare, OTR/L Acute Rehabilitation Services Office: [phone]   Evern Bio 09/03/2022, 12:24 PM

## 2022-09-03 NOTE — Care Management Important Message (Signed)
Important Message  Patient Details  Name: [name] [name] MRN: [id number] Date of Birth: 07/26/1940   Medicare Important Message Given:  Yes     Dorena Bodo 09/03/2022, 2:54 PM

## 2022-09-03 NOTE — Progress Notes (Addendum)
s                                                                         Orthopaedic Trauma Service Progress Note  Patient ID: [name] [name] MRN: [id number] DOB/AGE: 1940-03-28 83 y.o.  Subjective:  Doing well  Ambulated 25 ft today with PT No other ortho complaints 0  ROS As above  Objective:   VITALS:   Vitals:   09/02/22 2200 09/03/22 0421 09/03/22 0801 09/03/22 1338  BP: (!) 131/50 139/62 (!) 171/83 (!) 130/49  Pulse: 81 (!) 102 (!) 103 97  Resp: 16 16 17 17   Temp: 97.9 F (36.6 C) 98.1 F (36.7 C) 97.6 F (36.4 C) 98 F (36.7 C)  TempSrc: Oral Oral Oral Oral  SpO2: 98% 96% 100% 100%  Weight:      Height:        Estimated body mass index is 20.67 kg/m as calculated from the following:   Height as of this encounter: 5\' 9"  (1.753 m).   Weight as of this encounter: 63.5 kg.   Intake/Output      08/21 0701 08/22 0700 08/22 0701 08/23 0700   I.V. (mL/kg)     IV Piggyback     Total Intake(mL/kg)     Urine (mL/kg/hr) 400 (0.3) 1000 (1.8)   Blood     Total Output 400 1000   Net -400 -1000          LABS  Results for orders placed or performed during the hospital encounter of 08/30/22 (from the past 24 hour(s))  Glucose, capillary     Status: Abnormal   Collection Time: 09/02/22  4:13 PM  Result Value Ref Range   Glucose-Capillary 147 (H) 70 - 99 mg/dL  Glucose, capillary     Status: Abnormal   Collection Time: 09/02/22  7:47 PM  Result Value Ref Range   Glucose-Capillary 264 (H) 70 - 99 mg/dL  Glucose, capillary     Status: Abnormal   Collection Time: 09/03/22 12:12 AM  Result Value Ref Range   Glucose-Capillary 154 (H) 70 - 99 mg/dL  Glucose, capillary     Status: Abnormal   Collection Time: 09/03/22  3:56 AM  Result Value Ref Range   Glucose-Capillary 56 (L) 70 - 99 mg/dL  Glucose, capillary     Status: Abnormal   Collection Time: 09/03/22  4:17 AM  Result Value Ref Range   Glucose-Capillary 63 (L) 70 - 99 mg/dL  Glucose, capillary      Status: Abnormal   Collection Time: 09/03/22  4:43 AM  Result Value Ref Range   Glucose-Capillary 55 (L) 70 - 99 mg/dL  Glucose, capillary     Status: Abnormal   Collection Time: 09/03/22  5:06 AM  Result Value Ref Range   Glucose-Capillary 145 (H) 70 - 99 mg/dL  Glucose, capillary     Status: Abnormal   Collection Time: 09/03/22  7:58 AM  Result Value Ref Range   Glucose-Capillary 149 (H) 70 - 99 mg/dL  CBC     Status: Abnormal   Collection Time: 09/03/22 11:23 AM  Result Value Ref Range   WBC 9.5 4.0 - 10.5 K/uL   RBC 3.31 (L) 4.22 - 5.81 MIL/uL  Hemoglobin 10.1 (L) 13.0 - 17.0 g/dL   HCT 16.1 (L) 09.6 - 04.5 %   MCV 94.3 80.0 - 100.0 fL   MCH 30.5 26.0 - 34.0 pg   MCHC 32.4 30.0 - 36.0 g/dL   RDW 40.9 81.1 - 91.4 %   Platelets 198 150 - 400 K/uL   nRBC 0.0 0.0 - 0.2 %  Glucose, capillary     Status: Abnormal   Collection Time: 09/03/22 11:58 AM  Result Value Ref Range   Glucose-Capillary 366 (H) 70 - 99 mg/dL     PHYSICAL EXAM:   Gen: sleeping in chair  Lungs: unlabored Ext:       Right Lower Extremity              Dressing R hip with scant strikethrough but o/w intact             Ext warm              No DCT              Compartments are soft   Assessment/Plan: 2 Days Post-Op   Principal Problem:   Hip fracture (HCC) Active Problems:   Diabetes mellitus type 2, uncomplicated (HCC)   Hyperlipemia   Hypertension   Dehydration   Dementia (HCC)   Fall at home, initial encounter   Anti-infectives (From admission, onward)    Start     Dose/Rate Route Frequency Ordered Stop   09/01/22 2130  ceFAZolin (ANCEF) IVPB 2g/100 mL premix        2 g 200 mL/hr over 30 Minutes Intravenous Every 6 hours 09/01/22 2034 09/02/22 0458   09/01/22 1831  vancomycin (VANCOCIN) powder  Status:  Discontinued          As needed 09/01/22 1832 09/01/22 1910   09/01/22 0600  ceFAZolin (ANCEF) IVPB 2g/100 mL premix        2 g 200 mL/hr over 30 Minutes Intravenous On call to O.R.  08/31/22 1855 09/01/22 1646     .  POD/HD#: 2  82 y/o male s/p fall with right femoral neck fracture    S/p R hip hemiarthroplasty for right femoral neck fracture    Weightbearing: WBAT RLE with walker and assistance  ROM: posterior hip precautions right hip  Insicional and dressing care: Daily dressing changes with 4x4 gauze and tape or silicone foam dressing starting on 09/08/2022 Pain management: multimodal, minimize narcotics  NW:[id number] abx  Impediments to Fracture Healing: fracture indicative of fragility fracture. Recommend DEXA as outpt  Bone Health/Optimization: continue with home vitamin D    Orthopedic device(s):  walker    Showering: ok to bathe and clean wound with soap and water once there is no drainage   VTE prophylaxis: Lovenox 40mg  qd  x 30 days   Dispo: PT/OT evals.  Family wants to dc home once stable.  They have adequate help and are concerned about SNF with his dementia  Stable for DC home from ortho standpoint      Follow - up plan: 2 weeks   Contact information:  [name] MD, Montez Morita PA-  Mearl Latin, PA-C [phone] (C) 09/03/2022, 3:50 PM  [facility] [address] [place] [address] [phone] [phone] (F)    After 5pm and on the weekends please log on to Amion, go to orthopaedics and the look under the [facility] Call for the provider(s) on call. You can also call our office at [phone]  and then follow the prompts to be connected to the call team.  Patient ID: [name] [name], male   DOB: 1940-02-26, 82 y.o.   MRN: [id number]

## 2022-09-03 NOTE — Discharge Instructions (Signed)
[facility] Discharge Instructions   General Discharge Instructions   WEIGHT BEARING STATUS: weightbearing as tolerated R leg with walker   RANGE OF MOTION/ACTIVITY: posterior hip precautions Right hip otherwise activity as tolerated   Bone health: continue with vitamin d supplementation. Will arrange for bone density scan (DEXA)  Review the following resource for additional information regarding bone health  [email or web address]  Wound Care:daily dressing changes starting on 09/08/2022. Use 4x4 gauze and tape or a silicone foam dressing. Can leave open to air once there is no drainage. Can shower and clean with soap and water only once there is no drainage   DVT/PE prophylaxis: eliquis 2.5 mg tablets every 12 hours x 30 days for blood clot prevention   Diet: as you were eating previously.  Can use over the counter stool softeners and bowel preparations, such as Miralax, to help with bowel movements.  Narcotics can be constipating.  Be sure to drink plenty of fluids  PAIN MEDICATION USE AND EXPECTATIONS  You have likely been given narcotic medications to help control your pain.  After a traumatic event that results in an fracture (broken bone) with or without surgery, it is ok to use narcotic pain medications to help control one's pain.  We understand that everyone responds to pain differently and each individual patient will be evaluated on a regular basis for the continued need for narcotic medications. Ideally, narcotic medication use should last no more than 6-8 weeks (coinciding with fracture healing).   As a patient it is your responsibility as well to monitor narcotic medication use and report the amount and frequency you use these medications when you come to your office visit.   We would also advise that if you are using narcotic medications, you should take a dose prior to therapy to maximize you participation.  IF YOU ARE ON NARCOTIC  MEDICATIONS IT IS NOT PERMISSIBLE TO OPERATE A MOTOR VEHICLE (MOTORCYCLE/CAR/TRUCK/MOPED) OR HEAVY MACHINERY DO NOT MIX NARCOTICS WITH OTHER CNS (CENTRAL NERVOUS SYSTEM) DEPRESSANTS SUCH AS ALCOHOL   POST-OPERATIVE OPIOID TAPER INSTRUCTIONS: It is important to wean off of your opioid medication as soon as possible. If you do not need pain medication after your surgery it is ok to stop day one. Opioids include: Codeine, Hydrocodone(Norco, Vicodin), Oxycodone(Percocet, oxycontin) and hydromorphone amongst others.  Long term and even short term use of opiods can cause: Increased pain response Dependence Constipation Depression Respiratory depression And more.  Withdrawal symptoms can include Flu like symptoms Nausea, vomiting And more Techniques to manage these symptoms Hydrate well Eat regular healthy meals Stay active Use relaxation techniques(deep breathing, meditating, yoga) Do Not substitute Alcohol to help with tapering If you have been on opioids for less than two weeks and do not have pain than it is ok to stop all together.  Plan to wean off of opioids This plan should start within one week post op of your fracture surgery  Maintain the same interval or time between taking each dose and first decrease the dose.  Cut the total daily intake of opioids by one tablet each day Next start to increase the time between doses. The last dose that should be eliminated is the evening dose.    STOP SMOKING OR USING NICOTINE PRODUCTS!!!!  As discussed nicotine severely impairs your body's ability to heal surgical and traumatic wounds but also impairs bone healing.  Wounds and bone heal by forming microscopic blood vessels (angiogenesis) and nicotine is a vasoconstrictor (essentially, shrinks blood vessels).  Therefore, if vasoconstriction occurs to these microscopic blood vessels they essentially disappear and are unable to deliver necessary nutrients to the healing tissue.  This is one  modifiable factor that you can do to dramatically increase your chances of healing your injury.    (This means no smoking, no nicotine gum, patches, etc)  DO NOT USE NONSTEROIDAL ANTI-INFLAMMATORY DRUGS (NSAID'S)  Using products such as Advil (ibuprofen), Aleve (naproxen), Motrin (ibuprofen) for additional pain control during fracture healing can delay and/or prevent the healing response.  If you would like to take over the counter (OTC) medication, Tylenol (acetaminophen) is ok.  However, some narcotic medications that are given for pain control contain acetaminophen as well. Therefore, you should not exceed more than 4000 mg of tylenol in a day if you do not have liver disease.  Also note that there are may OTC medicines, such as cold medicines and allergy medicines that my contain tylenol as well.  If you have any questions about medications and/or interactions please ask your doctor/PA or your pharmacist.      ICE AND ELEVATE INJURED/OPERATIVE EXTREMITY  Using ice and elevating the injured extremity above your heart can help with swelling and pain control.  Icing in a pulsatile fashion, such as 20 minutes on and 20 minutes off, can be followed.    Do not place ice directly on skin. Make sure there is a barrier between to skin and the ice pack.    Using frozen items such as frozen peas works well as the conform nicely to the are that needs to be iced.  USE AN ACE WRAP OR TED HOSE FOR SWELLING CONTROL  In addition to icing and elevation, Ace wraps or TED hose are used to help limit and resolve swelling.  It is recommended to use Ace wraps or TED hose until you are informed to stop.    When using Ace Wraps start the wrapping distally (farthest away from the body) and wrap proximally (closer to the body)   Example: If you had surgery on your leg or thing and you do not have a splint on, start the ace wrap at the toes and work your way up to the thigh        If you had surgery on your upper extremity  and do not have a splint on, start the ace wrap at your fingers and work your way up to the upper arm  IF YOU ARE IN A SPLINT OR CAST DO NOT REMOVE IT FOR ANY REASON   If your splint gets wet for any reason please contact the office immediately. You may shower in your splint or cast as long as you keep it dry.  This can be done by wrapping in a cast cover or garbage back (or similar)  Do Not stick any thing down your splint or cast such as pencils, money, or hangers to try and scratch yourself with.  If you feel itchy take benadryl as prescribed on the bottle for itching  IF YOU ARE IN A CAM BOOT (BLACK BOOT)  You may remove boot periodically. Perform daily dressing changes as noted below.  Wash the liner of the boot regularly and wear a sock when wearing the boot. It is recommended that you sleep in the boot until told otherwise    Call office for the following: Temperature greater than 101F Persistent nausea and vomiting Severe uncontrolled pain Redness, tenderness, or signs of infection (pain, swelling, redness, odor or green/yellow discharge around  the site) Difficulty breathing, headache or visual disturbances Hives Persistent dizziness or light-headedness Extreme fatigue Any other questions or concerns you may have after discharge  In an emergency, call 911 or go to an Emergency Department at a nearby hospital  HELPFUL INFORMATION  If you had a block, it will wear off between 8-24 hrs postop typically.  This is period when your pain may go from nearly zero to the pain you would have had postop without the block.  This is an abrupt transition but nothing dangerous is happening.  You may take an extra dose of narcotic when this happens.  You should wean off your narcotic medicines as soon as you are able.  Most patients will be off or using minimal narcotics before their first postop appointment.   We suggest you use the pain medication the first night prior to going to bed, in  order to ease any pain when the anesthesia wears off. You should avoid taking pain medications on an empty stomach as it will make you nauseous.  Do not drink alcoholic beverages or take illicit drugs when taking pain medications.  In most states it is against the law to drive while you are in a splint or sling.  And certainly against the law to drive while taking narcotics.  You may return to work/school in the next couple of days when you feel up to it.   Pain medication may make you constipated.  Below are a few solutions to try in this order: Decrease the amount of pain medication if you aren't having pain. Drink lots of decaffeinated fluids. Drink prune juice and/or each dried prunes  If the first 3 don't work start with additional solutions Take Colace - an over-the-counter stool softener Take Senokot - an over-the-counter laxative Take Miralax - a stronger over-the-counter laxative     CALL THE OFFICE WITH ANY QUESTIONS OR CONCERNS: [phone]   VISIT OUR WEBSITE FOR ADDITIONAL INFORMATION: [email or web address]      Discharge Pin Site Instructions  Dress pins daily with Kerlix roll starting on POD 2. Wrap the Kerlix so that it tamps the skin down around the pin-skin interface to prevent/limit motion of the skin relative to the pin.  (Pin-skin motion is the primary cause of pain and infection related to external fixator pin sites).  Remove any crust or coagulum that may obstruct drainage with soap and water.  After POD 3, if there is no discernable drainage on the pin site dressing, the interval for change can by increased to every other day.  You may shower with the fixator, cleaning all pin sites gently with soap and water.  If you have a surgical wound this needs to be completely dry and without drainage before showering. Alternatively you can use a washcloth with soap and water and gently clean the injured extremity and external fixator, including all pinsites and  surgical wounds   The extremity can be lifted by the fixator to facilitate wound care and transfers.  Notify the office/Doctor if you experience increasing drainage, redness, or pain from a pin site, or if you notice purulent (thick, snot-like) drainage. As we discussed pin tract infections are common in this is most likely as a result of mechanical irritation from the skin pin interface. Primary treatment is hygiene and cleaning with soap and water. If this does not resolve with regular cleaning contact the office  Discharge Wound Care Instructions  Do NOT apply any ointments, solutions or lotions to pin sites or  surgical wounds.  These prevent needed drainage and even though solutions like hydrogen peroxide kill bacteria, they also damage cells lining the pin sites that help fight infection.  Applying lotions or ointments can keep the wounds moist and can cause them to breakdown and open up as well. This can increase the risk for infection. When in doubt call the office.  Surgical incisions should be dressed daily.  If any drainage is noted, use one layer of adaptic or Mepitel, then gauze, Kerlix, and an ace wrap.  [email or web address] [email or web address]  [email or web address]  These dressing supplies should be available at local medical supply stores ([employer], [facility], etc). They are not usually carried at places like [facility], [facility], [facility], etc  Once the incision is completely dry and without drainage, it may be left open to air out.  Showering may begin 36-48 hours later.  Cleaning gently with soap and water.  Traumatic wounds should be dressed daily as well.    One layer of adaptic, gauze, Kerlix, then ace wrap.  The adaptic can be discontinued once the draining  has ceased    If you have a wet to dry dressing: wet the gauze with saline the squeeze as much saline out so the gauze is moist (not soaking wet), place moistened gauze over wound, then place a dry gauze over the moist one, followed by Kerlix wrap, then ace wrap.

## 2022-09-03 NOTE — Progress Notes (Signed)
PROGRESS NOTE    [name] [name]  ZOX:[id number] DOB: 01/13/1940 DOA: 08/30/2022 PCP: Donita Brooks, MD   Brief Narrative:  This 82 year old male with PMH significant of anxiety, dementia, GERD, hyperlipidemia, hypertension, type 2 diabetes presented to the ED s/p mechanical fall.  He is found to have right hip pain and right elbow pain.  He could not bear weight after his fall.  X-ray right hip showed basiccervical femoral neck fracture.  Orthopedics is consulted, Hospitalist called for admission.    Assessment & Plan:   Principal Problem:   Hip fracture (HCC) Active Problems:   Diabetes mellitus type 2, uncomplicated (HCC)   Hyperlipemia   Hypertension   Dehydration   Dementia (HCC)   Fall at home, initial encounter   Right Hip fracture Ambulatory dysfunction/fall Intractable hip pain(improving): Patient presented status post mechanical fall. Right hip x-ray showed basicervical femoral neck fracture. Ortho consulted , ORIF 8/20. Tolerated well PT and OT evaluation ongoing - plan for DC home with family per their request. Discussed that if patient is unsafe to dispo home we would need to discuss SNF.   Leukocytosis, reactive Downtrending appropriately Peri-operative antibiotics only for now  Dementia (HCC) Continue Namenda and Aricept    Hypertension : Continue ARB  Hyperlipemia Continue Crestor   Diabetes mellitus type 2, uncontrolled with hyperglycemia,poa Last A1c 9.5 Markedly elevated at intake >500 Continue sliding scale Hypoglycemia overnight - stop long acting insulin and follow along  DVT prophylaxis: enoxaparin (LOVENOX) injection 40 mg Start: 09/02/22 0800 SCDs Start: 09/01/22 2034 SCDs Start: 08/30/22 2301 Code Status:   Code Status: Full Code Family Communication: Wife at bedside  Status is: Inpt  Dispo: The patient is from: Home              Anticipated d/c is to: TBD              Anticipated d/c date is: 48-72h              Patient  currently NOT medically stable for discharge given ongoing need for pain control and PT evaluation post operatively  Consultants:  Ortho  Procedures:  ORIF 8/20  Antimicrobials:  Peri-operatively   Subjective: Hypoglycemic overnight, discontinue long-acting insulin, patient feels well but remains a poor historian based on mental status baseline and dementia  Objective: Vitals:   09/02/22 1338 09/02/22 1800 09/02/22 2200 09/03/22 0421  BP: (!) 125/50  (!) 131/50 139/62  Pulse:  71 81 (!) 102  Resp: 17  16 16   Temp: 97.6 F (36.4 C)  97.9 F (36.6 C) 98.1 F (36.7 C)  TempSrc: Oral  Oral Oral  SpO2:  100% 98% 96%  Weight:      Height:        Intake/Output Summary (Last 24 hours) at 09/03/2022 0719 Last data filed at 09/03/2022 0432 Gross per 24 hour  Intake --  Output 400 ml  Net -400 ml   Filed Weights   08/30/22 1713  Weight: 63.5 kg    Examination:  General exam: Appears calm and comfortable  Respiratory system: Clear to auscultation. Respiratory effort normal. Cardiovascular system: S1 & S2 heard, RRR. No JVD, murmurs, rubs, gallops or clicks. No pedal edema. Gastrointestinal system: Abdomen is nondistended, soft and nontender. No organomegaly or masses felt. Normal bowel sounds heard. Central nervous system: Alert and oriented. No focal neurological deficits. Extremities: Symmetric 5 x 5 power other than RLE limited by pain.   Data Reviewed: I have personally reviewed following labs  and imaging studies  CBC: Recent Labs  Lab 08/30/22 2002 09/01/22 2201 09/02/22 1129  WBC 15.6* 13.9* 11.8*  NEUTROABS 13.6*  --   --   HGB 13.0 12.3* 11.1*  HCT 40.0 37.6* 33.4*  MCV 96.2 95.2 93.6  PLT 200 137* 180   Basic Metabolic Panel: Recent Labs  Lab 08/30/22 2002 09/01/22 2201 09/02/22 1129  NA 132*  --  137  K 4.1  --  3.9  CL 97*  --  102  CO2 23  --  25  GLUCOSE 526*  --  218*  BUN 32*  --  19  CREATININE 0.96 0.75 0.92  CALCIUM 9.2  --  8.7*    GFR: Estimated Creatinine Clearance: 56.6 mL/min (by C-G formula based on SCr of 0.92 mg/dL). Liver Function Tests: No results for input(s): "AST", "ALT", "ALKPHOS", "BILITOT", "PROT", "ALBUMIN" in the last 168 hours. No results for input(s): "LIPASE", "AMYLASE" in the last 168 hours. No results for input(s): "AMMONIA" in the last 168 hours. Coagulation Profile: Recent Labs  Lab 08/30/22 2002  INR 1.0   Cardiac Enzymes: No results for input(s): "CKTOTAL", "CKMB", "CKMBINDEX", "TROPONINI" in the last 168 hours. BNP (last 3 results) No results for input(s): "PROBNP" in the last 8760 hours. HbA1C: No results for input(s): "HGBA1C" in the last 72 hours. CBG: Recent Labs  Lab 09/03/22 0012 09/03/22 0356 09/03/22 0417 09/03/22 0443 09/03/22 0506  GLUCAP 154* 56* 63* 55* 145*   Lipid Profile: No results for input(s): "CHOL", "HDL", "LDLCALC", "TRIG", "CHOLHDL", "LDLDIRECT" in the last 72 hours. Thyroid Function Tests: No results for input(s): "TSH", "T4TOTAL", "FREET4", "T3FREE", "THYROIDAB" in the last 72 hours. Anemia Panel: No results for input(s): "VITAMINB12", "FOLATE", "FERRITIN", "TIBC", "IRON", "RETICCTPCT" in the last 72 hours. Sepsis Labs: No results for input(s): "PROCALCITON", "LATICACIDVEN" in the last 168 hours.  Recent Results (from the past 240 hour(s))  Surgical pcr screen     Status: Abnormal   Collection Time: 08/31/22  1:20 PM   Specimen: Nasal Mucosa; Nasal Swab  Result Value Ref Range Status   MRSA, PCR NEGATIVE NEGATIVE Final   Staphylococcus aureus POSITIVE (A) NEGATIVE Final    Comment: (NOTE) The Xpert SA Assay (FDA approved for NASAL specimens in patients 64 years of age and older), is one component of a comprehensive surveillance program. It is not intended to diagnose infection nor to guide or monitor treatment. Performed at [facility], [address] [place], [place] [address]          Radiology Studies: DG Hip Port Unilat  With Pelvis 1V Right  Result Date: 09/01/2022 CLINICAL DATA:  Femoral neck fracture EXAM: DG HIP (WITH OR WITHOUT PELVIS) 1V PORT RIGHT COMPARISON:  Right hip x-ray 08/30/2022 FINDINGS: There is a new right hip arthroplasty in anatomic alignment. There is lateral hip soft tissue swelling and air compatible with recent surgery. No acute fractures are seen. There is a healed right inferior pubic ramus fracture. No dislocation. There are mild degenerative changes of the left hip. IMPRESSION: Right hip arthroplasty in anatomic alignment. Electronically Signed   By: Darliss Cheney M.D.   On: 09/01/2022 19:49        Scheduled Meds:  acetaminophen  650 mg Oral Q8H   Or   acetaminophen  650 mg Rectal Q8H   Chlorhexidine Gluconate Cloth  6 each Topical Q0600   docusate sodium  100 mg Oral BID   donepezil  10 mg Oral QHS   enoxaparin (LOVENOX)  injection  40 mg Subcutaneous Q24H   insulin aspart  0-15 Units Subcutaneous Q4H   insulin glargine-yfgn  9 Units Subcutaneous QHS   losartan  50 mg Oral QHS   memantine  10 mg Oral BID   mupirocin ointment  1 Application Nasal BID   rosuvastatin  40 mg Oral Daily   Continuous Infusions:  sodium chloride 100 mL/hr at 09/02/22 0139     LOS: 4 days   Time spent:  Azucena Fallen, DO [facility]  If 7PM-7AM, please contact night-coverage [email or web address]  09/03/2022, 7:19 AM

## 2022-09-03 NOTE — Progress Notes (Signed)
Pt had hypoglycemic event CBG 56 at 0356. Orange juice 4 oz given. At 0417 CBG 63. Another orange juice given. At 0443, CBG 55. Dextrose 50% 12.5 g given. At 0506 CBG 145. Pt asymptomatic, no change in mental status. Virgel Manifold, NP notified.

## 2022-09-04 ENCOUNTER — Other Ambulatory Visit (HOSPITAL_COMMUNITY): Payer: Self-pay

## 2022-09-04 DIAGNOSIS — E860 Dehydration: Secondary | ICD-10-CM | POA: Diagnosis not present

## 2022-09-04 DIAGNOSIS — E119 Type 2 diabetes mellitus without complications: Secondary | ICD-10-CM | POA: Diagnosis not present

## 2022-09-04 DIAGNOSIS — S72001A Fracture of unspecified part of neck of right femur, initial encounter for closed fracture: Secondary | ICD-10-CM | POA: Diagnosis not present

## 2022-09-04 DIAGNOSIS — F0390 Unspecified dementia without behavioral disturbance: Secondary | ICD-10-CM | POA: Diagnosis not present

## 2022-09-04 LAB — GLUCOSE, CAPILLARY
Glucose-Capillary: 135 mg/dL — ABNORMAL HIGH (ref 70–99)
Glucose-Capillary: 166 mg/dL — ABNORMAL HIGH (ref 70–99)
Glucose-Capillary: 217 mg/dL — ABNORMAL HIGH (ref 70–99)
Glucose-Capillary: 253 mg/dL — ABNORMAL HIGH (ref 70–99)

## 2022-09-04 MED ORDER — OXYCODONE HCL 5 MG PO TABS
5.0000 mg | ORAL_TABLET | ORAL | 0 refills | Status: AC | PRN
  Filled 2022-09-04: qty 18, 3d supply, fill #0

## 2022-09-04 MED ORDER — INSULIN GLARGINE-YFGN 100 UNIT/ML ~~LOC~~ SOLN
5.0000 [IU] | Freq: Every day | SUBCUTANEOUS | Status: DC
  Administered 2022-09-04: 5 [IU] via SUBCUTANEOUS
  Filled 2022-09-04: qty 0.05

## 2022-09-04 NOTE — Discharge Summary (Signed)
Physician Discharge Summary  [name] [name] AOZ:[id number] DOB: [birth year] DOA: 08/30/2022  PCP: Donita Brooks, MD  Admit date: 08/30/2022 Discharge date: 09/04/2022  Admitted From: Home Disposition: Home  Recommendations for Outpatient Follow-up:  Follow up with PCP in 1-2 weeks Follow-up with orthopedic surgery as scheduled in the next 2 weeks:  Home Health: PT Equipment/Devices: Wheelchair  Discharge Condition: Stable CODE STATUS: Full Diet recommendation: As tolerated  Brief/Interim Summary: This 82 year old male with PMH significant of anxiety, dementia, GERD, hyperlipidemia, hypertension, type 2 diabetes presented to the ED s/p mechanical fall.  He is found to have right hip pain and right elbow pain.  He could not bear weight after his fall.  X-ray right hip showed basiccervical femoral neck fracture.  Orthopedics is consulted, Hospitalist called for admission.   Patient taken to surgery on the 20th with ORIF per orthopedics, tolerated procedure quite well, ambulating with physical therapy over the past 48 hours and per family discussion he is otherwise safe and stable for discharge home.  We did discuss possible discharge to skilled nursing facility but family refused indicating that given the patient's mental status and age he would not do well at a facility; thus a safe discharge home was our primary endpoint with therapy.  Family confirms they are able to care for the patient given his current level of needs and otherwise he is stable for discharge.     Discharge Diagnoses:  Principal Problem:   Hip fracture (HCC) Active Problems:   Diabetes mellitus type 2, uncomplicated (HCC)   Hyperlipemia   Hypertension   Dehydration   Dementia (HCC)   Fall at home, initial encounter  Right Hip fracture Ambulatory dysfunction/fall Intractable hip pain(improving): Leukocytosis, reactive Dementia (HCC) Hypertension : Hyperlipemia Diabetes mellitus type 2, uncontrolled with  hyperglycemia,poa  Discharge Instructions  Discharge Instructions     Face-to-face encounter (required for Medicare/Medicaid patients)   Complete by: As directed    I Azucena Fallen certify that this patient is under my care and that I, or a nurse practitioner or physician's assistant working with me, had a face-to-face encounter that meets the physician face-to-face encounter requirements with this patient on 09/04/2022. The encounter with the patient was in whole, or in part for the following medical condition(s) which is the primary reason for home health care (List medical condition): Ambulatory dysfunction/hip fracturea   The encounter with the patient was in whole, or in part, for the following medical condition, which is the primary reason for home health care: Ambulatory dysfunction, hip fracture   I certify that, based on my findings, the following services are medically necessary home health services: Physical therapy   Reason for Medically Necessary Home Health Services:  Skilled Nursing- Change/Decline in Patient Status Therapy- Investment banker, operational, Patent examiner Therapy- Instruction on use of Assistive Device for Ambulation on all Surfaces     My clinical findings support the need for the above services: Unable to leave home safely without assistance and/or assistive device   Further, I certify that my clinical findings support that this patient is homebound due to: Unable to leave home safely without assistance   Home Health   Complete by: As directed    To provide the following care/treatments:  PT OT        Allergies as of 09/04/2022       Reactions   Invokana [canagliflozin] Palpitations, Rash, Other (See Comments)   Rash, tachycardia,weight loss   Plavix [clopidogrel Bisulfate]  Upset stomach        Medication List     TAKE these medications    acetaminophen 325 MG tablet Commonly known as: TYLENOL Take 2 tablets (650 mg total) by  mouth every 8 (eight) hours as needed for mild pain or moderate pain.   apixaban 2.5 MG Tabs tablet Commonly known as: Eliquis Take 1 tablet (2.5 mg total) by mouth 2 (two) times daily.   B-D ULTRAFINE III SHORT PEN 31G X 8 MM Misc Generic drug: Insulin Pen Needle USE TO INJECT INSULIN EVERY DAY   brexpiprazole 1 MG Tabs tablet Commonly known as: Rexulti Take 1 tablet (1 mg total) by mouth daily.   Creon 24000-76000 units Cpep Generic drug: Pancrelipase (Lip-Prot-Amyl) TAKE 2 CAPSULES BY MOUTH PRIOR TO EACH MEAL AND 1 CAPSULE PRIOR TO EACH SNACK   Dexcom G7 Sensor Misc Inject 1 Application into the skin as directed. Change sensor every 10 days as directed.   Dialyvite Vitamin D 5000 125 MCG (5000 UT) capsule Generic drug: Cholecalciferol Take 5,000 Units by mouth daily.   donepezil 10 MG tablet Commonly known as: ARICEPT TAKE 1 TABLET(10 MG) BY MOUTH AT BEDTIME   HYDROcodone-acetaminophen 5-325 MG tablet Commonly known as: Norco Take 1 tablet by mouth every 6 (six) hours as needed for moderate pain.   Lantus SoloStar 100 UNIT/ML Solostar Pen Generic drug: insulin glargine ADMINISTER 15 UNITS UNDER THE SKIN DAILY   losartan 50 MG tablet Commonly known as: COZAAR TAKE 1 TABLET BY MOUTH EVERY NIGHT AT BEDTIME   memantine 10 MG tablet Commonly known as: NAMENDA TAKE 1 TABLET(10 MG) BY MOUTH TWICE DAILY   OneTouch Delica Plus Lancet33G Misc USE TO CHECK BLOOD SUGAR TWICE DAILY. Dx: E11.9.   OneTouch Verio test strip Generic drug: glucose blood USE TO TEST BLOOD SUGAR TWO TO THREE TIMES DAILY   OneTouch Verio w/Device Kit USE TO TEST BLOOD SUGAR LEVELS THREE TIMES DAILY   oxyCODONE 5 MG immediate release tablet Commonly known as: Oxy IR/ROXICODONE Take 1 tablet (5 mg total) by mouth every 4 (four) hours as needed for up to 3 days for breakthrough pain.   pioglitazone 45 MG tablet Commonly known as: ACTOS Take 1 tablet (45 mg total) by mouth daily.    rosuvastatin 40 MG tablet Commonly known as: CRESTOR TAKE 1 TABLET(40 MG) BY MOUTH DAILY               Durable Medical Equipment  (From admission, onward)           Start     Ordered   09/04/22 0932  For home use only DME lightweight manual wheelchair with seat cushion  Once       Comments: Patient suffers from  R hip fx which impairs their ability to perform daily activities like bathing in the home.  A walker will not resolve  issue with performing activities of daily living. A wheelchair will allow patient to safely perform daily activities. Patient is not able to propel themselves in the home using a standard weight wheelchair due to general weakness. Patient can self propel in the lightweight wheelchair. Length of need 6 months . Accessories: elevating leg rests (ELRs), wheel locks, extensions and anti-tippers.   09/04/22 0932            Follow-up Information     Donita Brooks, MD Follow up.   Specialty: Family Medicine Contact information: [address] [place] [address] [phone]  [facility] Follow up.   Why: [facility] will provide home health PT services, start of care within 48 hours post discharge Contact information: [address] [place] [address] [phone]         [name], MD. Schedule an appointment as soon as possible for a visit in 2 week(s).   Specialty: Orthopedic Surgery Contact information: [address] [place] [address] [phone]                Allergies  Allergen Reactions   Invokana [Canagliflozin] Palpitations, Rash and Other (See Comments)    Rash, tachycardia,weight loss    Plavix [Clopidogrel Bisulfate]     Upset stomach    Consultations: Orthopedic surgery  Procedures/Studies: DG Hip Port Unilat With Pelvis 1V Right  Result Date: 09/01/2022 CLINICAL DATA:  Femoral neck fracture EXAM: DG HIP (WITH OR WITHOUT PELVIS)  1V PORT RIGHT COMPARISON:  Right hip x-ray 08/30/2022 FINDINGS: There is a new right hip arthroplasty in anatomic alignment. There is lateral hip soft tissue swelling and air compatible with recent surgery. No acute fractures are seen. There is a healed right inferior pubic ramus fracture. No dislocation. There are mild degenerative changes of the left hip. IMPRESSION: Right hip arthroplasty in anatomic alignment. Electronically Signed   By: Darliss Cheney M.D.   On: 09/01/2022 19:49   DG Knee Right Port  Result Date: 08/31/2022 CLINICAL DATA:  Femoral neck fracture., Recent fall. EXAM: PORTABLE RIGHT KNEE - 1-2 VIEW COMPARISON:  None Available. FINDINGS: No evidence of fracture, dislocation, or joint effusion. Minor degenerative change for age with peripheral spurring. No erosion or focal bone abnormality. Soft tissues are unremarkable. IMPRESSION: No fracture or subluxation of the right knee. Minor degenerative change. Electronically Signed   By: Narda Rutherford M.D.   On: 08/31/2022 15:08   DG Chest Port 1 View  Result Date: 08/30/2022 CLINICAL DATA:  Fall, hip fracture EXAM: PORTABLE CHEST 1 VIEW COMPARISON:  06/10/2011 FINDINGS: Heart and mediastinal contours within normal limits. Aortic atherosclerosis. Lungs clear. No effusions or pneumothorax. No acute bony abnormality. IMPRESSION: No active disease. Electronically Signed   By: Charlett Nose M.D.   On: 08/30/2022 19:14   DG Humerus Right  Result Date: 08/30/2022 CLINICAL DATA:  Fall, right arm pain EXAM: RIGHT HUMERUS - 2+ VIEW COMPARISON:  None Available. FINDINGS: No acute bony abnormality. Specifically, no fracture, subluxation, or dislocation. Degenerative changes in the right AC joint. IMPRESSION: No acute bony abnormality. Electronically Signed   By: Charlett Nose M.D.   On: 08/30/2022 19:13   DG Elbow Complete Right  Result Date: 08/30/2022 CLINICAL DATA:  Fall, right elbow pain EXAM: RIGHT ELBOW - COMPLETE 3+ VIEW COMPARISON:  None  Available. FINDINGS: Suboptimal visualization due to positioning. No visible fracture, subluxation or dislocation. No visible joint effusion. IMPRESSION: Suboptimal study due to positioning. No visible acute bony abnormality. Electronically Signed   By: Charlett Nose M.D.   On: 08/30/2022 19:13   DG Hip Unilat  With Pelvis 2-3 Views Right  Result Date: 08/30/2022 CLINICAL DATA:  Fall, right hip pain EXAM: DG HIP (WITH OR WITHOUT PELVIS) 2-3V RIGHT COMPARISON:  09/15/2020 FINDINGS: There is a right basicervical femoral neck fracture. Minimal displacement. No significant angulation. No subluxation or dislocation. Hip joints and SI joints are symmetric. Chronic appearing right inferior pubic ramus fracture. IMPRESSION: Right basicervical femoral neck fracture. Electronically Signed   By: Charlett Nose M.D.   On:  08/30/2022 19:12     Subjective: No acute issues or events overnight, patient up in bedside chair today tolerating breakfast without any difficulty.  Review of systems limited given patient's mental status but denies any overt pain   Discharge Exam: Vitals:   09/04/22 0419 09/04/22 0731  BP: (!) 157/130 129/63  Pulse:  83  Resp: 18 20  Temp: 99 F (37.2 C) 98.4 F (36.9 C)  SpO2: 94% 94%   Vitals:   09/03/22 1338 09/03/22 2028 09/04/22 0419 09/04/22 0731  BP: (!) 130/49 (!) 161/80 (!) 157/130 129/63  Pulse: 97   83  Resp: 17 18 18 20   Temp: 98 F (36.7 C) 97.9 F (36.6 C) 99 F (37.2 C) 98.4 F (36.9 C)  TempSrc: Oral Oral Oral Oral  SpO2: 100% 98% 94% 94%  Weight:      Height:        General exam: Appears calm and comfortable  Respiratory system: Clear to auscultation. Respiratory effort normal. Cardiovascular system: S1 & S2 heard, RRR. No JVD, murmurs, rubs, gallops or clicks. No pedal edema. Gastrointestinal system: Abdomen is nondistended, soft and nontender. No organomegaly or masses felt. Normal bowel sounds heard. Central nervous system: Alert and oriented. No  focal neurological deficits. Extremities: Symmetric 5 x 5 power other than RLE limited by pain.  The results of significant diagnostics from this hospitalization (including imaging, microbiology, ancillary and laboratory) are listed below for reference.     Microbiology: Recent Results (from the past 240 hour(s))  Surgical pcr screen     Status: Abnormal   Collection Time: 08/31/22  1:20 PM   Specimen: Nasal Mucosa; Nasal Swab  Result Value Ref Range Status   MRSA, PCR NEGATIVE NEGATIVE Final   Staphylococcus aureus POSITIVE (A) NEGATIVE Final    Comment: (NOTE) The Xpert SA Assay (FDA approved for NASAL specimens in patients 27 years of age and older), is one component of a comprehensive surveillance program. It is not intended to diagnose infection nor to guide or monitor treatment. Performed at [facility], [address], [place], [place] [address]      Labs: BNP (last 3 results) No results for input(s): "BNP" in the last 8760 hours. Basic Metabolic Panel: Recent Labs  Lab 08/30/22 2002 09/01/22 2201 09/02/22 1129  NA 132*  --  137  K 4.1  --  3.9  CL 97*  --  102  CO2 23  --  25  GLUCOSE 526*  --  218*  BUN 32*  --  19  CREATININE 0.96 0.75 0.92  CALCIUM 9.2  --  8.7*   CBC: Recent Labs  Lab 08/30/22 2002 09/01/22 2201 09/02/22 1129 09/03/22 1123  WBC 15.6* 13.9* 11.8* 9.5  NEUTROABS 13.6*  --   --   --   HGB 13.0 12.3* 11.1* 10.1*  HCT 40.0 37.6* 33.4* 31.2*  MCV 96.2 95.2 93.6 94.3  PLT 200 137* 180 198   Urinalysis    Component Value Date/Time   COLORURINE STRAW (A) 08/30/2022 2205   APPEARANCEUR CLEAR 08/30/2022 2205   LABSPEC 1.028 08/30/2022 2205   PHURINE 5.0 08/30/2022 2205   GLUCOSEU >=500 (A) 08/30/2022 2205   HGBUR NEGATIVE 08/30/2022 2205   BILIRUBINUR NEGATIVE 08/30/2022 2205   KETONESUR 20 (A) 08/30/2022 2205   PROTEINUR NEGATIVE 08/30/2022 2205   NITRITE NEGATIVE 08/30/2022 2205   LEUKOCYTESUR NEGATIVE 08/30/2022 2205    Sepsis Labs Recent Labs  Lab 08/30/22 2002 09/01/22 2201 09/02/22 1129 09/03/22 1123  WBC 15.6* 13.9* 11.8* 9.5   Microbiology Recent Results (from the past 240 hour(s))  Surgical pcr screen     Status: Abnormal   Collection Time: 08/31/22  1:20 PM   Specimen: Nasal Mucosa; Nasal Swab  Result Value Ref Range Status   MRSA, PCR NEGATIVE NEGATIVE Final   Staphylococcus aureus POSITIVE (A) NEGATIVE Final    Comment: (NOTE) The Xpert SA Assay (FDA approved for NASAL specimens in patients 4 years of age and older), is one component of a comprehensive surveillance program. It is not intended to diagnose infection nor to guide or monitor treatment. Performed at [facility], [address] [place], [place] [address]      Time coordinating discharge: Over 30 minutes  SIGNED:   Azucena Fallen, DO [facility] 09/04/2022, 11:52 AM Pager   If 7PM-7AM, please contact night-coverage [email or web address]

## 2022-09-04 NOTE — Progress Notes (Signed)
Occupational Therapy Treatment Patient Details Name: [name] [name] MRN: [id number] DOB: Mar 08, 1940 Today's Date: 09/04/2022   History of present illness Pt is an 82 year old man admitted on 8/18 after a fall in which he fractured his R hip and injured his R elbow. Underwent R hip hemiarthroplasty on 8/20. PMH: dementia, anxiety, arthritis, HLD, HTN, DM2, CVA.   OT comments  Wife eager to take pt home, reports walking pt to bathroom by herself last night. Requesting w/c for home, CM notified. Reinforced posterior hip precautions with wife, assisting pt to EOB with bed pad. Wife to request more fabric bed pads from company of which she is employed as his caregiver. Family will use w/c to transport pt from vehicle into home.       If plan is discharge home, recommend the following:  Assistance with cooking/housework;Direct supervision/assist for medications management;Direct supervision/assist for financial management;Assist for transportation;Help with stairs or ramp for entrance;A little help with walking and/or transfers;A lot of help with bathing/dressing/bathroom   Equipment Recommendations  Wheelchair (measurements OT);Wheelchair cushion (measurements OT)    Recommendations for Other Services      Precautions / Restrictions Precautions Precautions: Fall;Posterior Hip Precaution Booklet Issued: Yes (comment) Precaution Comments: reinforced posterior hip precautions with wife Restrictions Weight Bearing Restrictions: Yes RLE Weight Bearing: Weight bearing as tolerated       Mobility Bed Mobility Overal bed mobility: Needs Assistance Bed Mobility: Supine to Sit, Sit to Supine     Supine to sit: Mod assist Sit to supine: Mod assist   General bed mobility comments: pt resistant to bed mobility due to pain, wife has bed pads at home she can use to assist hip to EOB    Transfers                   General transfer comment: wife reports walking pt to bathroom last night  with RW     Balance Overall balance assessment: Needs assistance   Sitting balance-Leahy Scale: Fair                                     ADL either performed or assessed with clinical judgement   ADL                                              Extremity/Trunk Assessment              Vision       Perception     Praxis      Cognition Arousal: Alert Behavior During Therapy: Agitated (intermittently) Overall Cognitive Status: History of cognitive impairments - at baseline                                          Exercises      Shoulder Instructions       General Comments      Pertinent Vitals/ Pain       Pain Assessment Pain Assessment: Faces Faces Pain Scale: Hurts even more Pain Location: R hip Pain Descriptors / Indicators: Guarding, Grimacing, Discomfort, Sore Pain Intervention(s): Patient requesting pain meds-RN notified  Home Living  Prior Functioning/Environment              Frequency  Min 1X/week        Progress Toward Goals  OT Goals(current goals can now be found in the care plan section)  Progress towards OT goals: Progressing toward goals  Acute Rehab OT Goals OT Goal Formulation: With patient Time For Goal Achievement: 09/16/22 Potential to Achieve Goals: Good  Plan      Co-evaluation                 AM-PAC OT "6 Clicks" Daily Activity     Outcome Measure   Help from another person eating meals?: A Little Help from another person taking care of personal grooming?: A Little Help from another person toileting, which includes using toliet, bedpan, or urinal?: Total Help from another person bathing (including washing, rinsing, drying)?: A Lot Help from another person to put on and taking off regular upper body clothing?: A Little Help from another person to put on and taking off regular lower body  clothing?: Total 6 Click Score: 13    End of Session    OT Visit Diagnosis: Unsteadiness on feet (R26.81);Other abnormalities of gait and mobility (R26.89);Pain;Muscle weakness (generalized) (M62.81);Other symptoms and signs involving cognitive function Pain - Right/Left: Right   Activity Tolerance Patient limited by pain   Patient Left in bed;with call bell/phone within reach;with family/visitor present;with bed alarm set   Nurse Communication          Time: 5366-4403 OT Time Calculation (min): 29 min  Charges: OT General Charges $OT Visit: 1 Visit OT Treatments $Self Care/Home Management : 23-37 mins  Berna Spare, OTR/L Acute Rehabilitation Services Office: [phone]   Evern Bio 09/04/2022, 9:48 AM

## 2022-09-04 NOTE — Progress Notes (Signed)
    Durable Medical Equipment  (From admission, onward)           Start     Ordered   09/04/22 0932  For home use only DME lightweight manual wheelchair with seat cushion  Once       Comments: Patient suffers from  R hip fx which impairs their ability to perform daily activities like bathing in the home.  A walker will not resolve  issue with performing activities of daily living. A wheelchair will allow patient to safely perform daily activities. Patient is not able to propel themselves in the home using a standard weight wheelchair due to general weakness. Patient can self propel in the lightweight wheelchair. Length of need 6 months . Accessories: elevating leg rests (ELRs), wheel locks, extensions and anti-tippers.   09/04/22 0932

## 2022-09-04 NOTE — Plan of Care (Signed)
AVS reviewed with patient and family at bedside.  DME delivered.  Patient and family excited for discharge. Waiting for TOC meds.  Patient DC home with family.

## 2022-09-04 NOTE — TOC Progression Note (Signed)
Transition of Care [facility]) - Progression Note    Patient Details  Name: [name] [name] MRN: [id number] Date of Birth: [birth year]  Transition of Care [facility]) CM/SW Contact  Epifanio Lesches, RN Phone Number: 09/04/2022, 9:49 AM  Clinical Narrative:     Referral made with [facility] for W/C. Equipment will be delivered to bedside prior to d/c. [facility] team will continue assisting with needs....  Expected Discharge Plan: Home w Home Health Services (vs SNF) Barriers to Discharge: Continued Medical Work up  Expected Discharge Plan and Services   Discharge Planning Services: CM Consult   Living arrangements for the past 2 months: Single Family Home                 DME Arranged: Community education officer wheelchair with seat cushion   Date DME Agency Contacted: 09/04/22 Time DME Agency Contacted: [phone] Representative spoke with at DME Agency: [facility] HH Arranged: PT, OT HH Agency: [facility] Date [facility] Agency Contacted: 09/03/22 Time HH Agency Contacted: 0830 Representative spoke with at [facility] Agency: [facility]   Social Determinants of Health (SDOH) Interventions SDOH Screenings   Food Insecurity: No Food Insecurity (11/05/2021)  Housing: Low Risk  (11/05/2021)  Transportation Needs: No Transportation Needs (11/05/2021)  Utilities: Not At Risk (11/05/2021)  Alcohol Screen: Low Risk  (11/05/2021)  Depression (PHQ2-9): High Risk (11/05/2021)  Financial Resource Strain: Low Risk  (11/05/2021)  Physical Activity: Insufficiently Active (11/05/2021)  Social Connections: Moderately Integrated (11/05/2021)  Stress: No Stress Concern Present (11/05/2021)  Tobacco Use: Medium Risk (09/01/2022)    Readmission Risk Interventions     No data to display

## 2022-09-04 NOTE — TOC Transition Note (Signed)
Transition of Care [facility]) - CM/SW Discharge Note   Patient Details  Name: [name] [name] MRN: [id number] Date of Birth: 1940/12/06  Transition of Care [facility]) CM/SW Contact:  Epifanio Lesches, RN Phone Number: 09/04/2022, 10:04 AM   Clinical Narrative:    Patient will DC [id number] Anticipated DC date: 09/04/2022 Family notified: yes Transport by: car   Per MD patient ready for DC today. RN, patient, patient's family, and [facility] notified of DC. Wife/ family to assist with care once d/c. DME : W/C will be delivered to bedside prior to d/c by [facility]. TOC pharmacy to deliver RX meds to bedside prior to d/c. Post hospital f/u noted on AVS. Wife to provide transportation to home.  RNCM will sign off for now as intervention is no longer needed. Please consult [place] again if new needs arise.    Final next level of care: Home w Home Health Services Barriers to Discharge: No Barriers Identified   Patient Goals and CMS Choice   Choice offered to / list presented to : Spouse  Discharge Placement                         Discharge Plan and Services Additional resources added to the After Visit Summary for     Discharge Planning Services: CM Consult            DME Arranged: Lightweight manual wheelchair with seat cushion   Date DME Agency Contacted: 09/04/22 Time DME Agency Contacted: [phone] Representative spoke with at DME Agency: [facility] HH Arranged: PT, OT HH Agency: [facility] Date [facility] Agency Contacted: 09/03/22 Time HH Agency Contacted: 0830 Representative spoke with at [facility] Agency: [facility]  Social Determinants of Health (SDOH) Interventions SDOH Screenings   Food Insecurity: No Food Insecurity (11/05/2021)  Housing: Low Risk  (11/05/2021)  Transportation Needs: No Transportation Needs (11/05/2021)  Utilities: Not At Risk (11/05/2021)  Alcohol Screen: Low Risk  (11/05/2021)  Depression (PHQ2-9): High Risk (11/05/2021)  Financial Resource Strain: Low Risk   (11/05/2021)  Physical Activity: Insufficiently Active (11/05/2021)  Social Connections: Moderately Integrated (11/05/2021)  Stress: No Stress Concern Present (11/05/2021)  Tobacco Use: Medium Risk (09/01/2022)     Readmission Risk Interventions     No data to display

## 2022-09-07 ENCOUNTER — Telehealth: Payer: Self-pay | Admitting: Family Medicine

## 2022-09-07 ENCOUNTER — Telehealth: Payer: Self-pay

## 2022-09-07 LAB — GLUCOSE, CAPILLARY
Glucose-Capillary: 140 mg/dL — ABNORMAL HIGH (ref 70–99)
Glucose-Capillary: 51 mg/dL — ABNORMAL LOW (ref 70–99)
Glucose-Capillary: 69 mg/dL — ABNORMAL LOW (ref 70–99)
Glucose-Capillary: 173 mg/dL — ABNORMAL HIGH (ref 70–99)

## 2022-09-07 NOTE — Transitions of Care (Post Inpatient/ED Visit) (Signed)
09/07/2022  Name: [name] [name] MRN: [id number] DOB: 03-10-1940  Today's TOC FU Call Status: Today's TOC FU Call Status:: Successful TOC FU Call Completed TOC FU Call Complete Date: 09/07/22 Patient's Name and Date of Birth confirmed.  Transition Care Management Follow-up Telephone Call Date of Discharge: 09/04/22 Discharge Facility: [facility]) Type of Discharge: Inpatient Admission Primary Inpatient Discharge Diagnosis:: Right Hip Fracture How have you been since you were released from the hospital?: Better (Patients spouse reports he is making slow progress) Any questions or concerns?: No  Items Reviewed: Did you receive and understand the discharge instructions provided?: Yes Medications obtained,verified, and reconciled?: Yes (Medications Reviewed) Any new allergies since your discharge?: No Dietary orders reviewed?: Yes Type of Diet Ordered:: As tolerated Do you have support at home?: Yes People in Home: spouse Name of Support/Comfort Primary Source: Rene Kocher  Medications Reviewed Today: Medications Reviewed Today     Reviewed by Jodelle Gross, RN (Case Manager) on 09/07/22 at 1318  Med List Status: <None>   Medication Order Taking? Sig Documenting Provider Last Dose Status Informant  acetaminophen (TYLENOL) 325 MG tablet [id number] Yes Take 2 tablets (650 mg total) by mouth every 8 (eight) hours as needed for mild pain or moderate pain. Montez Morita, PA-C Taking Active   apixaban (ELIQUIS) 2.5 MG TABS tablet [id number] Yes Take 1 tablet (2.5 mg total) by mouth 2 (two) times daily. Montez Morita, PA-C Taking Active   B-D ULTRAFINE III SHORT PEN 31G X 8 MM MISC [id number] Yes USE TO INJECT INSULIN EVERY DAY Donita Brooks, MD Taking Active Spouse/Significant Other, Pharmacy Records  Blood Glucose Monitoring Suppl [facility] VERIO) w/Device Andria Rhein [id number] Yes USE TO TEST BLOOD SUGAR LEVELS THREE TIMES DAILY Dorena Bodo, PA-C Taking Active Spouse/Significant Other, Pharmacy  Records  brexpiprazole (REXULTI) 1 MG TABS tablet [id number] Yes Take 1 tablet (1 mg total) by mouth daily. Donita Brooks, MD Taking Active Spouse/Significant Other, Pharmacy Records  Cholecalciferol (DIALYVITE VITAMIN D 5000) 125 MCG (5000 UT) capsule [id number] Yes Take 5,000 Units by mouth daily. [provider] Taking Active Spouse/Significant Other, Pharmacy Records  Continuous Glucose Sensor (DEXCOM G7 SENSOR) MISC [id number] Yes Inject 1 Application into the skin as directed. Change sensor every 10 days as directed. Dani Gobble, NP Taking Active Spouse/Significant Other, Pharmacy Records  donepezil (ARICEPT) 10 MG tablet [id number] Yes TAKE 1 TABLET(10 MG) BY MOUTH AT BEDTIME Donita Brooks, MD Taking Active Spouse/Significant Other, Pharmacy Records  HYDROcodone-acetaminophen [facility]) 5-325 MG tablet [id number] Yes Take 1 tablet by mouth every 6 (six) hours as needed for moderate pain. Montez Morita, PA-C Taking Active   Lancets [facility] DELICA PLUS [place]) [place] [id number] Yes USE TO CHECK BLOOD SUGAR TWICE DAILY. Dx: E11.9. Donita Brooks, MD Taking Active Spouse/Significant Other, Pharmacy Records  LANTUS SOLOSTAR 100 UNIT/ML Solostar Pen [id number] Yes ADMINISTER 15 UNITS UNDER THE SKIN DAILY Donita Brooks, MD Taking Active Spouse/Significant Other, Pharmacy Records  losartan (COZAAR) 50 MG tablet [id number] Yes TAKE 1 TABLET BY MOUTH EVERY NIGHT AT BEDTIME Donita Brooks, MD Taking Active Spouse/Significant Other, Pharmacy Records  memantine [facility]) 10 MG tablet [id number] Yes TAKE 1 TABLET(10 MG) BY MOUTH TWICE DAILY Donita Brooks, MD Taking Active Spouse/Significant Other, Pharmacy Records  [facility] VERIO test strip [id number] Yes USE TO TEST BLOOD SUGAR TWO TO THREE TIMES DAILY Donita Brooks, MD Taking Active Spouse/Significant Other, Pharmacy Records  oxyCODONE (OXY IR/ROXICODONE) 5 MG immediate release tablet [id number] Yes Take 1 tablet (  5 mg total) by  mouth every 4 (four) hours as needed for up to 3 days for breakthrough pain. Azucena Fallen, MD Taking Active   Pancrelipase, Lip-Prot-Amyl, (CREON) 24000-76000 units CPEP [id number] Yes TAKE 2 CAPSULES BY MOUTH PRIOR TO EACH MEAL AND 1 CAPSULE PRIOR TO EACH SNACK Dani Gobble, NP Taking Active Spouse/Significant Other, Pharmacy Records  pioglitazone (ACTOS) 45 MG tablet [id number] Yes Take 1 tablet (45 mg total) by mouth daily. Dani Gobble, NP Taking Active Spouse/Significant Other, Pharmacy Records           Med Note Erin Fulling, [facility]   Mon Aug 31, 2022  9:22 AM)    rosuvastatin (CRESTOR) 40 MG tablet [id number] Yes TAKE 1 TABLET(40 MG) BY MOUTH DAILY Donita Brooks, MD Taking Active Spouse/Significant Other, Pharmacy Records            Home Care and Equipment/Supplies: Were Home Health Services Ordered?: Yes Name of Home Health Agency:: [phone] Has Agency set up a time to come to your home?: No EMR reviewed for Home Health Orders:  (Patient's wife spoke with [facility], they will call to schedule later today) Any new equipment or medical supplies ordered?: Yes Name of Medical supply agency?: [employer] Were you able to get the equipment/medical supplies?: Yes Do you have any questions related to the use of the equipment/supplies?: No  Functional Questionnaire: Do you need assistance with bathing/showering or dressing?: Yes Do you need assistance with meal preparation?: Yes Do you need assistance with eating?: No Do you have difficulty maintaining continence: Yes Do you need assistance with getting out of bed/getting out of a chair/moving?: Yes Do you have difficulty managing or taking your medications?: Yes  Follow up appointments reviewed: PCP Follow-up appointment confirmed?: Yes Date of PCP follow-up appointment?: 09/15/22 Follow-up Provider: Dr. Tanya Nones Specialist [facility] Follow-up appointment confirmed?: Yes Date of Specialist follow-up appointment?:  09/23/22 Follow-Up Specialty Provider:: Dr. Carola Frost Do you need transportation to your follow-up appointment?: No Do you understand care options if your condition(s) worsen?: Yes-patient verbalized understanding  SDOH Interventions Today    Flowsheet Row Most Recent Value  SDOH Interventions   Food Insecurity Interventions Intervention Not Indicated  Housing Interventions Intervention Not Indicated  Transportation Interventions Intervention Not Indicated  Utilities Interventions Intervention Not Indicated      TOC Interventions Today    Flowsheet Row Most Recent Value  TOC Interventions   TOC Interventions Discussed/Reviewed TOC Interventions Discussed, TOC Interventions Reviewed, Post discharge activity limitations per provider       Jodelle Gross RN, BSN, CCM [facility] RN Care Coordinator/ Transitions of Care Direct Dial: [phone]  Fax: [phone]

## 2022-09-07 NOTE — Telephone Encounter (Signed)
Patient's wife called to report patient has developed a yeast infection from prescribed antibiotics.  Requesting script.  Pharmacy confirmed as  [id number] - [place],  - [address], [place] [address] Phone: [phone]  Fax: [phone] DEA #: [id number]   Please advise at [phone]

## 2022-09-08 ENCOUNTER — Telehealth: Payer: Self-pay

## 2022-09-08 ENCOUNTER — Other Ambulatory Visit: Payer: Self-pay

## 2022-09-08 ENCOUNTER — Other Ambulatory Visit: Payer: Self-pay | Admitting: Family Medicine

## 2022-09-08 DIAGNOSIS — E119 Type 2 diabetes mellitus without complications: Secondary | ICD-10-CM

## 2022-09-08 MED ORDER — FLUCONAZOLE 150 MG PO TABS: 150.0000 mg | ORAL_TABLET | ORAL | 0 refills | Status: DC

## 2022-09-08 MED ORDER — CLOTRIMAZOLE 1 % EX CREA: 1.0000 | TOPICAL_CREAM | Freq: Two times a day (BID) | CUTANEOUS | 0 refills | Status: DC

## 2022-09-08 MED ORDER — LANTUS SOLOSTAR 100 UNIT/ML ~~LOC~~ SOPN: 30.0000 [IU] | PEN_INJECTOR | Freq: Every day | SUBCUTANEOUS | 0 refills | Status: DC

## 2022-09-08 MED ORDER — METFORMIN HCL 500 MG PO TABS: 500.0000 mg | ORAL_TABLET | Freq: Two times a day (BID) | ORAL | 0 refills | Status: DC

## 2022-09-08 NOTE — Telephone Encounter (Signed)
Yeast infection is all over his penis, scrotum, between both legs.   Please advise.

## 2022-09-08 NOTE — Telephone Encounter (Signed)
Left a message requesting pt return call to the office. 

## 2022-09-08 NOTE — Telephone Encounter (Signed)
Pt's wife called stating pt's glucose has averaged 429 for the past 3 days and while speaking to her she checked his glucose which resulted in 393. States pt has not been able to get his glucose below 200 since being discharged from the hospital after a partial hip replacement. States he takes Lantus 15 units daily and Actos 45mg  every day. Discussed with Dr.Nida. Advised her pt needs to increase his Lantus to 30 units daily and add metformin 500mg  bid and if his glucose continues to run over 300 to contact us tomorrow per Dr.Nida's orders. Pt's wife voiced understanding. Rx's for metformin 500mg  bid and lantus 30 units daily sent to [facility] in [place] on [address].

## 2022-09-13 ENCOUNTER — Inpatient Hospital Stay (HOSPITAL_COMMUNITY)
Admission: EM | Admit: 2022-09-13 | Discharge: 2022-09-25 | DRG: 683 | Disposition: A | Discharge status: Home Health | Payer: PPO | Attending: Family Medicine | Admitting: Family Medicine

## 2022-09-13 ENCOUNTER — Encounter (HOSPITAL_COMMUNITY): Payer: Self-pay | Admitting: *Deleted

## 2022-09-13 ENCOUNTER — Emergency Department (HOSPITAL_COMMUNITY): Payer: PPO

## 2022-09-13 ENCOUNTER — Other Ambulatory Visit: Payer: Self-pay

## 2022-09-13 DIAGNOSIS — R339 Retention of urine, unspecified: Secondary | ICD-10-CM | POA: Diagnosis present

## 2022-09-13 DIAGNOSIS — Z8041 Family history of malignant neoplasm of ovary: Secondary | ICD-10-CM

## 2022-09-13 DIAGNOSIS — B369 Superficial mycosis, unspecified: Secondary | ICD-10-CM | POA: Diagnosis present

## 2022-09-13 DIAGNOSIS — E860 Dehydration: Secondary | ICD-10-CM | POA: Diagnosis present

## 2022-09-13 DIAGNOSIS — D75839 Thrombocytosis, unspecified: Secondary | ICD-10-CM | POA: Diagnosis present

## 2022-09-13 DIAGNOSIS — E1165 Type 2 diabetes mellitus with hyperglycemia: Secondary | ICD-10-CM | POA: Diagnosis not present

## 2022-09-13 DIAGNOSIS — E46 Unspecified protein-calorie malnutrition: Secondary | ICD-10-CM | POA: Insufficient documentation

## 2022-09-13 DIAGNOSIS — Z86010 Personal history of colonic polyps: Secondary | ICD-10-CM

## 2022-09-13 DIAGNOSIS — K828 Other specified diseases of gallbladder: Secondary | ICD-10-CM | POA: Diagnosis present

## 2022-09-13 DIAGNOSIS — Z808 Family history of malignant neoplasm of other organs or systems: Secondary | ICD-10-CM

## 2022-09-13 DIAGNOSIS — I10 Essential (primary) hypertension: Secondary | ICD-10-CM | POA: Diagnosis not present

## 2022-09-13 DIAGNOSIS — D72829 Elevated white blood cell count, unspecified: Secondary | ICD-10-CM | POA: Diagnosis not present

## 2022-09-13 DIAGNOSIS — N320 Bladder-neck obstruction: Secondary | ICD-10-CM | POA: Diagnosis not present

## 2022-09-13 DIAGNOSIS — E876 Hypokalemia: Secondary | ICD-10-CM | POA: Diagnosis present

## 2022-09-13 DIAGNOSIS — I700 Atherosclerosis of aorta: Secondary | ICD-10-CM | POA: Diagnosis not present

## 2022-09-13 DIAGNOSIS — D638 Anemia in other chronic diseases classified elsewhere: Secondary | ICD-10-CM | POA: Diagnosis not present

## 2022-09-13 DIAGNOSIS — R404 Transient alteration of awareness: Secondary | ICD-10-CM | POA: Diagnosis not present

## 2022-09-13 DIAGNOSIS — Z5320 Procedure and treatment not carried out because of patient's decision for unspecified reasons: Secondary | ICD-10-CM | POA: Diagnosis present

## 2022-09-13 DIAGNOSIS — M1990 Unspecified osteoarthritis, unspecified site: Secondary | ICD-10-CM | POA: Diagnosis present

## 2022-09-13 DIAGNOSIS — K5900 Constipation, unspecified: Secondary | ICD-10-CM | POA: Diagnosis present

## 2022-09-13 DIAGNOSIS — I959 Hypotension, unspecified: Secondary | ICD-10-CM | POA: Diagnosis not present

## 2022-09-13 DIAGNOSIS — Z833 Family history of diabetes mellitus: Secondary | ICD-10-CM

## 2022-09-13 DIAGNOSIS — Z8249 Family history of ischemic heart disease and other diseases of the circulatory system: Secondary | ICD-10-CM

## 2022-09-13 DIAGNOSIS — E782 Mixed hyperlipidemia: Secondary | ICD-10-CM | POA: Diagnosis not present

## 2022-09-13 DIAGNOSIS — R4182 Altered mental status, unspecified: Secondary | ICD-10-CM | POA: Diagnosis not present

## 2022-09-13 DIAGNOSIS — N179 Acute kidney failure, unspecified: Principal | ICD-10-CM | POA: Diagnosis present

## 2022-09-13 DIAGNOSIS — Z7901 Long term (current) use of anticoagulants: Secondary | ICD-10-CM

## 2022-09-13 DIAGNOSIS — Z87891 Personal history of nicotine dependence: Secondary | ICD-10-CM

## 2022-09-13 DIAGNOSIS — Z85828 Personal history of other malignant neoplasm of skin: Secondary | ICD-10-CM

## 2022-09-13 DIAGNOSIS — R109 Unspecified abdominal pain: Secondary | ICD-10-CM | POA: Diagnosis not present

## 2022-09-13 DIAGNOSIS — G8929 Other chronic pain: Secondary | ICD-10-CM | POA: Diagnosis present

## 2022-09-13 DIAGNOSIS — Z89022 Acquired absence of left finger(s): Secondary | ICD-10-CM

## 2022-09-13 DIAGNOSIS — Z794 Long term (current) use of insulin: Secondary | ICD-10-CM

## 2022-09-13 DIAGNOSIS — Z79899 Other long term (current) drug therapy: Secondary | ICD-10-CM

## 2022-09-13 DIAGNOSIS — Z7984 Long term (current) use of oral hypoglycemic drugs: Secondary | ICD-10-CM

## 2022-09-13 DIAGNOSIS — Z96641 Presence of right artificial hip joint: Secondary | ICD-10-CM | POA: Diagnosis present

## 2022-09-13 DIAGNOSIS — E11649 Type 2 diabetes mellitus with hypoglycemia without coma: Secondary | ICD-10-CM | POA: Diagnosis present

## 2022-09-13 DIAGNOSIS — Z888 Allergy status to other drugs, medicaments and biological substances status: Secondary | ICD-10-CM

## 2022-09-13 DIAGNOSIS — Z83438 Family history of other disorder of lipoprotein metabolism and other lipidemia: Secondary | ICD-10-CM

## 2022-09-13 DIAGNOSIS — Z66 Do not resuscitate: Secondary | ICD-10-CM | POA: Diagnosis present

## 2022-09-13 DIAGNOSIS — F0394 Unspecified dementia, unspecified severity, with anxiety: Secondary | ICD-10-CM | POA: Diagnosis present

## 2022-09-13 DIAGNOSIS — L89152 Pressure ulcer of sacral region, stage 2: Secondary | ICD-10-CM | POA: Diagnosis present

## 2022-09-13 DIAGNOSIS — Z515 Encounter for palliative care: Secondary | ICD-10-CM

## 2022-09-13 DIAGNOSIS — E8809 Other disorders of plasma-protein metabolism, not elsewhere classified: Secondary | ICD-10-CM | POA: Diagnosis present

## 2022-09-13 DIAGNOSIS — R000 Tachycardia, unspecified: Secondary | ICD-10-CM | POA: Diagnosis present

## 2022-09-13 DIAGNOSIS — E119 Type 2 diabetes mellitus without complications: Secondary | ICD-10-CM

## 2022-09-13 DIAGNOSIS — I6782 Cerebral ischemia: Secondary | ICD-10-CM | POA: Diagnosis not present

## 2022-09-13 LAB — CBC WITH DIFFERENTIAL/PLATELET
Abs Immature Granulocytes: 0.19 10*3/uL — ABNORMAL HIGH (ref 0.00–0.07)
Basophils Absolute: 0.1 10*3/uL (ref 0.0–0.1)
Basophils Relative: 0 %
Eosinophils Absolute: 0.1 10*3/uL (ref 0.0–0.5)
Eosinophils Relative: 1 %
HCT: 32.8 % — ABNORMAL LOW (ref 39.0–52.0)
Hemoglobin: 10.6 g/dL — ABNORMAL LOW (ref 13.0–17.0)
Immature Granulocytes: 1 %
Lymphocytes Relative: 11 %
Lymphs Abs: 2 10*3/uL (ref 0.7–4.0)
MCH: 31 pg (ref 26.0–34.0)
MCHC: 32.3 g/dL (ref 30.0–36.0)
MCV: 95.9 fL (ref 80.0–100.0)
Monocytes Absolute: 1.4 10*3/uL — ABNORMAL HIGH (ref 0.1–1.0)
Monocytes Relative: 8 %
Neutro Abs: 14.5 10*3/uL — ABNORMAL HIGH (ref 1.7–7.7)
Neutrophils Relative %: 79 %
Platelets: 518 10*3/uL — ABNORMAL HIGH (ref 150–400)
RBC: 3.42 MIL/uL — ABNORMAL LOW (ref 4.22–5.81)
RDW: 13.1 % (ref 11.5–15.5)
WBC: 18.2 10*3/uL — ABNORMAL HIGH (ref 4.0–10.5)
nRBC: 0 % (ref 0.0–0.2)

## 2022-09-13 LAB — COMPREHENSIVE METABOLIC PANEL
ALT: 27 U/L (ref 0–44)
AST: 30 U/L (ref 15–41)
Albumin: 2.6 g/dL — ABNORMAL LOW (ref 3.5–5.0)
Alkaline Phosphatase: 72 U/L (ref 38–126)
Anion gap: 13 (ref 5–15)
BUN: 67 mg/dL — ABNORMAL HIGH (ref 8–23)
CO2: 26 mmol/L (ref 22–32)
Calcium: 9.4 mg/dL (ref 8.9–10.3)
Chloride: 96 mmol/L — ABNORMAL LOW (ref 98–111)
Creatinine, Ser: 1.98 mg/dL — ABNORMAL HIGH (ref 0.61–1.24)
GFR, Estimated: 33 mL/min — ABNORMAL LOW (ref >60)
Glucose, Bld: 176 mg/dL — ABNORMAL HIGH (ref 70–99)
Potassium: 4 mmol/L (ref 3.5–5.1)
Sodium: 135 mmol/L (ref 135–145)
Total Bilirubin: 0.6 mg/dL (ref 0.3–1.2)
Total Protein: 6.5 g/dL (ref 6.5–8.1)

## 2022-09-13 LAB — URINALYSIS, ROUTINE W REFLEX MICROSCOPIC
Bacteria, UA: NONE SEEN
Bilirubin Urine: NEGATIVE
Glucose, UA: >=500 mg/dL — AB
Ketones, ur: NEGATIVE mg/dL
Nitrite: NEGATIVE
Protein, ur: 30 mg/dL — AB
Specific Gravity, Urine: 1.009 (ref 1.005–1.030)
WBC, UA: >50 WBC/hpf (ref 0–5)
pH: 6 (ref 5.0–8.0)

## 2022-09-13 LAB — LIPASE, BLOOD: Lipase: 26 U/L (ref 11–51)

## 2022-09-13 MED ORDER — IOHEXOL 300 MG/ML  SOLN
80.0000 mL | Freq: Once | INTRAMUSCULAR | Status: AC | PRN
  Administered 2022-09-13: 80 mL via INTRAVENOUS

## 2022-09-13 MED ORDER — SODIUM CHLORIDE 0.9 % IV BOLUS
500.0000 mL | Freq: Once | INTRAVENOUS | Status: AC
  Administered 2022-09-13: 500 mL via INTRAVENOUS

## 2022-09-13 MED ORDER — SODIUM CHLORIDE 0.9 % IV SOLN: INTRAVENOUS | Status: DC
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
## 2022-09-13 NOTE — ED Triage Notes (Signed)
Pt BIB EMS for complains of constipation x 2 days. Pt had hip surgery a few weeks ago and has been on pain medication. Pt had home health nurse on Friday give enema with minimal output. Pt is demented at baseline and complains of all over pain.

## 2022-09-13 NOTE — ED Provider Notes (Signed)
[facility] AT [facility] Provider Note   CSN: [id number] Arrival date & time: 09/13/22  5621     History  Chief Complaint  Patient presents with   Constipation    [name] [name] is a 82 y.o. male.  Patient brought in by EMS from home.  Concern was that patient was constipated.  Home health nurse did a rectal exam trying to disimpact him.  Apparently has not had a bowel movement for 2 days.  Patient was admitted August 18 discharged home on August 23.  Patient has a history of anxiety dementia hyperlipidemia hypertension type 2 diabetes had a mechanical fall found to have right hip pain right elbow pain x-ray of the right hip showed femoral neck fracture orthopedic was consulted hospitalist called for admission patient went to surgery on the 20th for open reduction internal fixation per orthopedics.  Family states now that patient has not had a bowel movement also has not been urinating.  Patient not taking p.o. very well but they claim that he has been drinking a lot of fluids.  Patient according to them is a little bit more confused than baseline.       Home Medications Prior to Admission medications   Medication Sig Start Date End Date Taking? Authorizing Provider  clotrimazole (LOTRIMIN AF) 1 % cream Apply 1 Application topically 2 (two) times daily. 09/08/22   Donita Brooks, MD  fluconazole (DIFLUCAN) 150 MG tablet Take 1 tablet (150 mg total) by mouth once a week. 09/08/22   Donita Brooks, MD  acetaminophen (TYLENOL) 325 MG tablet Take 2 tablets (650 mg total) by mouth every 8 (eight) hours as needed for mild pain or moderate pain. 09/03/22   Montez Morita, PA-C  apixaban (ELIQUIS) 2.5 MG TABS tablet Take 1 tablet (2.5 mg total) by mouth 2 (two) times daily. 09/03/22 10/04/22  Montez Morita, PA-C  B-D ULTRAFINE III SHORT PEN 31G X 8 MM MISC USE TO INJECT INSULIN EVERY DAY 05/26/22   Donita Brooks, MD  Blood Glucose Monitoring Suppl [facility] VERIO)  w/Device KIT USE TO TEST BLOOD SUGAR LEVELS THREE TIMES DAILY 05/13/15   Allayne Butcher B, PA-C  brexpiprazole (REXULTI) 1 MG TABS tablet Take 1 tablet (1 mg total) by mouth daily. 06/25/22   Donita Brooks, MD  Cholecalciferol (DIALYVITE VITAMIN D 5000) 125 MCG (5000 UT) capsule Take 5,000 Units by mouth daily.    [provider]  Continuous Glucose Sensor (DEXCOM G7 SENSOR) MISC Inject 1 Application into the skin as directed. Change sensor every 10 days as directed. 06/17/22   Dani Gobble, NP  donepezil (ARICEPT) 10 MG tablet TAKE 1 TABLET(10 MG) BY MOUTH AT BEDTIME 04/14/22   Donita Brooks, MD  HYDROcodone-acetaminophen (NORCO) 5-325 MG tablet Take 1 tablet by mouth every 6 (six) hours as needed for moderate pain. 09/03/22   Montez Morita, PA-C  insulin glargine (LANTUS SOLOSTAR) 100 UNIT/ML Solostar Pen Inject 30 Units into the skin daily. 09/08/22   Roma Kayser, MD  Lancets (ONETOUCH DELICA PLUS LANCET33G) MISC USE TO CHECK BLOOD SUGAR TWICE DAILY. Dx: E11.9. 11/13/19   Donita Brooks, MD  losartan (COZAAR) 50 MG tablet TAKE 1 TABLET BY MOUTH EVERY NIGHT AT BEDTIME 07/31/22   Donita Brooks, MD  memantine (NAMENDA) 10 MG tablet TAKE 1 TABLET(10 MG) BY MOUTH TWICE DAILY 10/22/21   Donita Brooks, MD  metFORMIN (GLUCOPHAGE) 500 MG tablet Take 1 tablet (500 mg  total) by mouth 2 (two) times daily with a meal. 09/08/22   Nida, Denman George, MD  ONETOUCH VERIO test strip USE TO TEST BLOOD SUGAR TWO TO THREE TIMES DAILY 01/13/22   Donita Brooks, MD  Pancrelipase, Lip-Prot-Amyl, (CREON) 24000-76000 units CPEP TAKE 2 CAPSULES BY MOUTH PRIOR TO EACH MEAL AND 1 CAPSULE PRIOR TO [facility] 08/25/22   Reardon, Freddi Starr, NP  pioglitazone (ACTOS) 45 MG tablet Take 1 tablet (45 mg total) by mouth daily. 06/17/22   Dani Gobble, NP  rosuvastatin (CRESTOR) 40 MG tablet TAKE 1 TABLET(40 MG) BY MOUTH DAILY 05/11/22   Donita Brooks, MD      Allergies    Invokana [canagliflozin]  and Plavix [clopidogrel bisulfate]    Review of Systems   Review of Systems  Unable to perform ROS: Dementia    Physical Exam Updated Vital Signs BP (!) 140/80   Pulse (!) 110   Temp 98.4 F (36.9 C)   Resp 19   Ht 1.753 m (5\' 9" )   Wt 63 kg   SpO2 100%   BMI 20.51 kg/m  Physical Exam Vitals and nursing note reviewed.  Constitutional:      General: He is not in acute distress.    Appearance: Normal appearance. He is well-developed.  HENT:     Head: Normocephalic and atraumatic.     Mouth/Throat:     Mouth: Mucous membranes are dry.     Comments: Mucous membranes very dry. Eyes:     Conjunctiva/sclera: Conjunctivae normal.  Cardiovascular:     Rate and Rhythm: Normal rate and regular rhythm.     Heart sounds: No murmur heard. Pulmonary:     Effort: Pulmonary effort is normal. No respiratory distress.     Breath sounds: Normal breath sounds.  Abdominal:     General: There is distension.     Palpations: Abdomen is soft. There is mass.     Tenderness: There is no abdominal tenderness.     Comments: Patient with palpable bladder up above the umbilicus.  Upper part of the abdomen otherwise is flat.  Musculoskeletal:        General: No swelling.     Cervical back: Neck supple.     Comments: Right hip wound dressing in place no erythema looks very normal no bleeding.  Distally good cap refill.  No obvious deformities.  Skin:    General: Skin is warm and dry.     Capillary Refill: Capillary refill takes less than 2 seconds.  Neurological:     Mental Status: He is alert. Mental status is at baseline.     Comments: Confused but will talk.  Does state that he has some abdominal pain.  Psychiatric:        Mood and Affect: Mood normal.     ED Results / Procedures / Treatments   Labs (all labs ordered are listed, but only abnormal results are displayed) Labs Reviewed  CBC WITH DIFFERENTIAL/PLATELET - Abnormal; Notable for the following components:      Result Value    WBC 18.2 (*)    RBC 3.42 (*)    Hemoglobin 10.6 (*)    HCT 32.8 (*)    Platelets 518 (*)    Neutro Abs 14.5 (*)    Monocytes Absolute 1.4 (*)    Abs Immature Granulocytes 0.19 (*)    All other components within normal limits  COMPREHENSIVE METABOLIC PANEL - Abnormal; Notable for the following components:   Chloride  96 (*)    Glucose, Bld 176 (*)    BUN 67 (*)    Creatinine, Ser 1.98 (*)    Albumin 2.6 (*)    GFR, Estimated 33 (*)    All other components within normal limits  URINALYSIS, ROUTINE W REFLEX MICROSCOPIC - Abnormal; Notable for the following components:   Glucose, UA >=500 (*)    Hgb urine dipstick MODERATE (*)    Protein, ur 30 (*)    Leukocytes,Ua SMALL (*)    All other components within normal limits  LIPASE, BLOOD    EKG None  Radiology CT ABDOMEN PELVIS W CONTRAST  Result Date: 09/13/2022 CLINICAL DATA:  Increasing abdominal pain and distension EXAM: CT ABDOMEN AND PELVIS WITH CONTRAST TECHNIQUE: Multidetector CT imaging of the abdomen and pelvis was performed using the standard protocol following bolus administration of intravenous contrast. RADIATION DOSE REDUCTION: This exam was performed according to the departmental dose-optimization program which includes automated exposure control, adjustment of the mA and/or kV according to patient size and/or use of iterative reconstruction technique. CONTRAST:  80mL OMNIPAQUE IOHEXOL 300 MG/ML  SOLN COMPARISON:  None Available. FINDINGS: Lower chest: No acute pleural or parenchymal lung disease. Hepatobiliary: Calcified gallbladder wall consistent with porcelain gallbladder. No evidence of acute cholecystitis. The liver is unremarkable. Pancreas: Unremarkable. No pancreatic ductal dilatation or surrounding inflammatory changes. Spleen: Normal in size without focal abnormality. Adrenals/Urinary Tract: No acute renal abnormalities. No urinary tract calculi or obstructive uropathy. The adrenals are unremarkable. The bladder is  decompressed with a Foley catheter, with bladder wall thickening and trabeculation suggesting chronic bladder outlet obstruction. Stomach/Bowel: No bowel obstruction or ileus. No bowel wall thickening or inflammatory change. Vascular/Lymphatic: Aortic atherosclerosis. No enlarged abdominal or pelvic lymph nodes. Reproductive: Prostate is unremarkable. Other: No free fluid or free intraperitoneal gas. No abdominal wall hernia. Musculoskeletal: No acute or destructive bony abnormalities. Postsurgical changes from recent right hip hemiarthroplasty, without evidence of complication. Soft tissue swelling overlying the right hip consistent with recent surgical intervention. Reconstructed images demonstrate no additional findings. IMPRESSION: 1. Porcelain gallbladder.  No evidence of acute cholecystitis. 2. No bowel obstruction or ileus. 3. Bladder wall thickening with trabeculations, consistent with chronic bladder outlet obstruction. The bladder is decompressed with an indwelling Foley catheter. 4.  Aortic Atherosclerosis (ICD10-I70.0). 5. Postsurgical changes from recent right hip hemiarthroplasty. No evidence of acute complication. Electronically Signed   By: Sharlet Salina M.D.   On: 09/13/2022 23:10   CT Head Wo Contrast  Result Date: 09/13/2022 CLINICAL DATA:  Altered level of consciousness, abdominal pain EXAM: CT HEAD WITHOUT CONTRAST TECHNIQUE: Contiguous axial images were obtained from the base of the skull through the vertex without intravenous contrast. RADIATION DOSE REDUCTION: This exam was performed according to the departmental dose-optimization program which includes automated exposure control, adjustment of the mA and/or kV according to patient size and/or use of iterative reconstruction technique. COMPARISON:  04/11/2017 FINDINGS: Brain: Chronic ischemic changes are seen within the periventricular white matter and left basal ganglia. No evidence of acute infarct or hemorrhage. Lateral ventricles and  remaining midline structures are unremarkable. No acute extra-axial fluid collections. No mass effect. Vascular: No hyperdense vessel or unexpected calcification. Skull: Normal. Negative for fracture or focal lesion. Sinuses/Orbits: No acute finding. Other: None. IMPRESSION: 1. Chronic ischemic changes as above. No acute intracranial process. Electronically Signed   By: Sharlet Salina M.D.   On: 09/13/2022 23:03   DG Chest Port 1 View  Result Date: 09/13/2022 CLINICAL DATA:  Mental  status changes EXAM: PORTABLE CHEST 1 VIEW COMPARISON:  08/30/2022 FINDINGS: Heart and mediastinal contours are within normal limits. No focal opacities or effusions. No acute bony abnormality. Aortic atherosclerosis. IMPRESSION: No active cardiopulmonary disease. Electronically Signed   By: Charlett Nose M.D.   On: 09/13/2022 22:20    Procedures Procedures    Medications Ordered in ED Medications  0.9 %  sodium chloride infusion ( Intravenous New Bag/Given 09/13/22 2302)  iohexol (OMNIPAQUE) 300 MG/ML solution 80 mL (80 mLs Intravenous Contrast Given 09/13/22 2237)    ED Course/ Medical Decision Making/ A&P                                 Medical Decision Making Amount and/or Complexity of Data Reviewed Labs: ordered. Radiology: ordered.  Risk Prescription drug management. Decision regarding hospitalization.   Bladder scan was done which confirmed a large amount of urine in the bladder.  Foley catheter placed.  We got out over a liter.  Patient feeling much better.  Urine be sent for culture.  Urinalysis RBC 6-10 white blood cells greater than 50 no bacteria seen.  Suspect this is all due to the bladder distention.  And not a UTI urinary tract infection.  Vital signs temp 98.4 heart rate 108 blood pressure 143/62 oxygen saturation is 95% on room air.  CBC white count markedly elevated 18,000 hemoglobin 18.6 platelets 518.  Complete metabolic panel significant for creatinine 1.98 and GFR of 33 normal he is  greater than 60.  Patient's glucose was 176.  Potassium normal at 4.0.  Lipase good at 26.  Portable chest x-ray no acute cardiopulmonary disease.  CT head chronic ischemic changes nothing acute.  CT of abdomen pelvis porcelain gallbladder no evidence of acute cholecystitis no bowel obstruction or ileus bladder wall thickening consistent with chronic bladder outlet obstruction.  Bladder decompressed by the Foley catheter as we expect postsurgical changes in the recent right hip hemiarthroplasty no evidence of any acute complication.  Based on this patient warrants admission for acute kidney injury.  Will keep Foley catheter in place.  Will continue to hydrate.  Final Clinical Impression(s) / ED Diagnoses Final diagnoses:  Urinary retention  AKI (acute kidney injury) [facility])    Rx / DC Orders ED Discharge Orders     None         Vanetta Mulders, MD 09/13/22 2322

## 2022-09-13 NOTE — ED Notes (Signed)
Per wife at bedside, pt has had a decline in mental status over several days with increased pain in his abdomen. Wife reports abd distention, denies urinary issues. Pt has not received vicodin since Friday. Has been receiving tylenol as needed for pain
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
## 2022-09-14 ENCOUNTER — Encounter (HOSPITAL_COMMUNITY): Payer: Self-pay | Admitting: Internal Medicine

## 2022-09-14 DIAGNOSIS — N320 Bladder-neck obstruction: Secondary | ICD-10-CM | POA: Insufficient documentation

## 2022-09-14 DIAGNOSIS — E8809 Other disorders of plasma-protein metabolism, not elsewhere classified: Secondary | ICD-10-CM | POA: Diagnosis not present

## 2022-09-14 DIAGNOSIS — D72829 Elevated white blood cell count, unspecified: Secondary | ICD-10-CM | POA: Diagnosis not present

## 2022-09-14 DIAGNOSIS — D75839 Thrombocytosis, unspecified: Secondary | ICD-10-CM | POA: Diagnosis not present

## 2022-09-14 DIAGNOSIS — Z66 Do not resuscitate: Secondary | ICD-10-CM | POA: Diagnosis not present

## 2022-09-14 DIAGNOSIS — E1165 Type 2 diabetes mellitus with hyperglycemia: Secondary | ICD-10-CM | POA: Insufficient documentation

## 2022-09-14 DIAGNOSIS — M85861 Other specified disorders of bone density and structure, right lower leg: Secondary | ICD-10-CM | POA: Diagnosis not present

## 2022-09-14 DIAGNOSIS — E11649 Type 2 diabetes mellitus with hypoglycemia without coma: Secondary | ICD-10-CM | POA: Diagnosis not present

## 2022-09-14 DIAGNOSIS — F0390 Unspecified dementia without behavioral disturbance: Secondary | ICD-10-CM

## 2022-09-14 DIAGNOSIS — Z87891 Personal history of nicotine dependence: Secondary | ICD-10-CM | POA: Diagnosis not present

## 2022-09-14 DIAGNOSIS — Z85828 Personal history of other malignant neoplasm of skin: Secondary | ICD-10-CM | POA: Diagnosis not present

## 2022-09-14 DIAGNOSIS — E46 Unspecified protein-calorie malnutrition: Secondary | ICD-10-CM | POA: Diagnosis not present

## 2022-09-14 DIAGNOSIS — L89152 Pressure ulcer of sacral region, stage 2: Secondary | ICD-10-CM | POA: Diagnosis not present

## 2022-09-14 DIAGNOSIS — E08649 Diabetes mellitus due to underlying condition with hypoglycemia without coma: Secondary | ICD-10-CM | POA: Diagnosis not present

## 2022-09-14 DIAGNOSIS — Z794 Long term (current) use of insulin: Secondary | ICD-10-CM

## 2022-09-14 DIAGNOSIS — I10 Essential (primary) hypertension: Secondary | ICD-10-CM | POA: Diagnosis not present

## 2022-09-14 DIAGNOSIS — M25561 Pain in right knee: Secondary | ICD-10-CM | POA: Diagnosis not present

## 2022-09-14 DIAGNOSIS — Z7901 Long term (current) use of anticoagulants: Secondary | ICD-10-CM | POA: Diagnosis not present

## 2022-09-14 DIAGNOSIS — E876 Hypokalemia: Secondary | ICD-10-CM | POA: Diagnosis not present

## 2022-09-14 DIAGNOSIS — E860 Dehydration: Secondary | ICD-10-CM | POA: Diagnosis not present

## 2022-09-14 DIAGNOSIS — Z7189 Other specified counseling: Secondary | ICD-10-CM | POA: Diagnosis not present

## 2022-09-14 DIAGNOSIS — N179 Acute kidney failure, unspecified: Secondary | ICD-10-CM | POA: Diagnosis present

## 2022-09-14 DIAGNOSIS — Z515 Encounter for palliative care: Secondary | ICD-10-CM | POA: Diagnosis not present

## 2022-09-14 DIAGNOSIS — R339 Retention of urine, unspecified: Secondary | ICD-10-CM | POA: Diagnosis present

## 2022-09-14 DIAGNOSIS — E782 Mixed hyperlipidemia: Secondary | ICD-10-CM

## 2022-09-14 DIAGNOSIS — S72009A Fracture of unspecified part of neck of unspecified femur, initial encounter for closed fracture: Secondary | ICD-10-CM | POA: Diagnosis not present

## 2022-09-14 DIAGNOSIS — Z86010 Personal history of colonic polyps: Secondary | ICD-10-CM | POA: Diagnosis not present

## 2022-09-14 DIAGNOSIS — Z96641 Presence of right artificial hip joint: Secondary | ICD-10-CM | POA: Diagnosis not present

## 2022-09-14 DIAGNOSIS — F0394 Unspecified dementia, unspecified severity, with anxiety: Secondary | ICD-10-CM | POA: Diagnosis present

## 2022-09-14 DIAGNOSIS — Z8249 Family history of ischemic heart disease and other diseases of the circulatory system: Secondary | ICD-10-CM | POA: Diagnosis not present

## 2022-09-14 DIAGNOSIS — D638 Anemia in other chronic diseases classified elsewhere: Secondary | ICD-10-CM | POA: Insufficient documentation

## 2022-09-14 DIAGNOSIS — B369 Superficial mycosis, unspecified: Secondary | ICD-10-CM | POA: Diagnosis not present

## 2022-09-14 LAB — COMPREHENSIVE METABOLIC PANEL
ALT: 21 U/L (ref 0–44)
AST: 26 U/L (ref 15–41)
Albumin: 2.3 g/dL — ABNORMAL LOW (ref 3.5–5.0)
Alkaline Phosphatase: 61 U/L (ref 38–126)
Anion gap: 4 — ABNORMAL LOW (ref 5–15)
BUN: 48 mg/dL — ABNORMAL HIGH (ref 8–23)
CO2: 32 mmol/L (ref 22–32)
Calcium: 9 mg/dL (ref 8.9–10.3)
Chloride: 102 mmol/L (ref 98–111)
Creatinine, Ser: 1.37 mg/dL — ABNORMAL HIGH (ref 0.61–1.24)
GFR, Estimated: 52 mL/min — ABNORMAL LOW (ref >60)
Glucose, Bld: 58 mg/dL — ABNORMAL LOW (ref 70–99)
Potassium: 3.3 mmol/L — ABNORMAL LOW (ref 3.5–5.1)
Sodium: 138 mmol/L (ref 135–145)
Total Bilirubin: 0.4 mg/dL (ref 0.3–1.2)
Total Protein: 5.7 g/dL — ABNORMAL LOW (ref 6.5–8.1)

## 2022-09-14 LAB — GLUCOSE, CAPILLARY
Glucose-Capillary: 78 mg/dL (ref 70–99)
Glucose-Capillary: 46 mg/dL — ABNORMAL LOW (ref 70–99)
Glucose-Capillary: 58 mg/dL — ABNORMAL LOW (ref 70–99)
Glucose-Capillary: 87 mg/dL (ref 70–99)
Glucose-Capillary: 189 mg/dL — ABNORMAL HIGH (ref 70–99)
Glucose-Capillary: 118 mg/dL — ABNORMAL HIGH (ref 70–99)
Glucose-Capillary: 144 mg/dL — ABNORMAL HIGH (ref 70–99)

## 2022-09-14 LAB — CBC
HCT: 29.5 % — ABNORMAL LOW (ref 39.0–52.0)
Hemoglobin: 9.4 g/dL — ABNORMAL LOW (ref 13.0–17.0)
MCH: 30.7 pg (ref 26.0–34.0)
MCHC: 31.9 g/dL (ref 30.0–36.0)
MCV: 96.4 fL (ref 80.0–100.0)
Platelets: 452 10*3/uL — ABNORMAL HIGH (ref 150–400)
RBC: 3.06 MIL/uL — ABNORMAL LOW (ref 4.22–5.81)
RDW: 12.8 % (ref 11.5–15.5)
WBC: 16.9 10*3/uL — ABNORMAL HIGH (ref 4.0–10.5)
nRBC: 0 % (ref 0.0–0.2)

## 2022-09-14 LAB — PHOSPHORUS: Phosphorus: 3.4 mg/dL (ref 2.5–4.6)

## 2022-09-14 LAB — MAGNESIUM: Magnesium: 2 mg/dL (ref 1.7–2.4)

## 2022-09-14 MED ORDER — ROSUVASTATIN CALCIUM 20 MG PO TABS
40.0000 mg | ORAL_TABLET | Freq: Every day | ORAL | Status: DC
  Administered 2022-09-14 – 2022-09-25 (×12): 40 mg via ORAL
  Filled 2022-09-14 (×12): qty 2

## 2022-09-14 MED ORDER — INSULIN ASPART 100 UNIT/ML IJ SOLN
0.0000 [IU] | Freq: Every day | INTRAMUSCULAR | Status: DC
  Administered 2022-09-17: 4 [IU] via SUBCUTANEOUS
  Administered 2022-09-20: 5 [IU] via SUBCUTANEOUS
  Administered 2022-09-21 – 2022-09-22 (×2): 3 [IU] via SUBCUTANEOUS

## 2022-09-14 MED ORDER — ENOXAPARIN SODIUM 30 MG/0.3ML IJ SOSY
30.0000 mg | PREFILLED_SYRINGE | INTRAMUSCULAR | Status: DC
  Administered 2022-09-14: 30 mg via SUBCUTANEOUS
  Filled 2022-09-14: qty 0.3

## 2022-09-14 MED ORDER — ACETAMINOPHEN 650 MG RE SUPP: 650.0000 mg | Freq: Four times a day (QID) | RECTAL | Status: DC | PRN

## 2022-09-14 MED ORDER — HYDROCODONE-ACETAMINOPHEN 5-325 MG PO TABS
1.0000 | ORAL_TABLET | Freq: Three times a day (TID) | ORAL | Status: DC | PRN
  Administered 2022-09-14 – 2022-09-25 (×6): 1 via ORAL
  Filled 2022-09-14 (×6): qty 1

## 2022-09-14 MED ORDER — INSULIN GLARGINE-YFGN 100 UNIT/ML ~~LOC~~ SOLN
10.0000 [IU] | Freq: Every day | SUBCUTANEOUS | Status: DC
  Administered 2022-09-14 – 2022-09-23 (×10): 10 [IU] via SUBCUTANEOUS
  Filled 2022-09-14 (×11): qty 0.1

## 2022-09-14 MED ORDER — ONDANSETRON HCL 4 MG PO TABS: 4.0000 mg | ORAL_TABLET | Freq: Four times a day (QID) | ORAL | Status: DC | PRN

## 2022-09-14 MED ORDER — ACETAMINOPHEN 325 MG PO TABS: 650.0000 mg | ORAL_TABLET | Freq: Four times a day (QID) | ORAL | Status: DC | PRN

## 2022-09-14 MED ORDER — ONDANSETRON HCL 4 MG/2ML IJ SOLN: 4.0000 mg | Freq: Four times a day (QID) | INTRAMUSCULAR | Status: DC | PRN

## 2022-09-14 MED ORDER — INSULIN ASPART 100 UNIT/ML IJ SOLN
0.0000 [IU] | Freq: Three times a day (TID) | INTRAMUSCULAR | Status: DC
  Administered 2022-09-14 – 2022-09-15 (×3): 3 [IU] via SUBCUTANEOUS
  Administered 2022-09-16: 5 [IU] via SUBCUTANEOUS
  Administered 2022-09-17: 3 [IU] via SUBCUTANEOUS
  Administered 2022-09-17 – 2022-09-18 (×2): 2 [IU] via SUBCUTANEOUS
  Administered 2022-09-18 (×2): 3 [IU] via SUBCUTANEOUS
  Administered 2022-09-19: 5 [IU] via SUBCUTANEOUS
  Administered 2022-09-19: 3 [IU] via SUBCUTANEOUS
  Administered 2022-09-19: 8 [IU] via SUBCUTANEOUS
  Administered 2022-09-20: 15 [IU] via SUBCUTANEOUS
  Administered 2022-09-20: 3 [IU] via SUBCUTANEOUS
  Administered 2022-09-21: 2 [IU] via SUBCUTANEOUS
  Administered 2022-09-21: 5 [IU] via SUBCUTANEOUS
  Administered 2022-09-22: 3 [IU] via SUBCUTANEOUS
  Administered 2022-09-22 – 2022-09-23 (×2): 8 [IU] via SUBCUTANEOUS
  Administered 2022-09-23: 2 [IU] via SUBCUTANEOUS
  Administered 2022-09-23: 3 [IU] via SUBCUTANEOUS
  Administered 2022-09-24: 2 [IU] via SUBCUTANEOUS
  Administered 2022-09-24 – 2022-09-25 (×2): 3 [IU] via SUBCUTANEOUS

## 2022-09-14 MED ORDER — GLUCERNA SHAKE PO LIQD
237.0000 mL | Freq: Three times a day (TID) | ORAL | Status: DC
  Administered 2022-09-14 – 2022-09-25 (×27): 237 mL via ORAL
  Filled 2022-09-14 (×2): qty 237

## 2022-09-14 MED ORDER — CHLORHEXIDINE GLUCONATE CLOTH 2 % EX PADS
6.0000 | MEDICATED_PAD | Freq: Every day | CUTANEOUS | Status: DC
  Administered 2022-09-15 – 2022-09-22 (×8): 6 via TOPICAL

## 2022-09-14 MED ORDER — ENOXAPARIN SODIUM 40 MG/0.4ML IJ SOSY
40.0000 mg | PREFILLED_SYRINGE | INTRAMUSCULAR | Status: DC
  Administered 2022-09-15 – 2022-09-25 (×9): 40 mg via SUBCUTANEOUS
  Filled 2022-09-14 (×11): qty 0.4

## 2022-09-14 MED ORDER — ACETAMINOPHEN 325 MG PO TABS
650.0000 mg | ORAL_TABLET | Freq: Two times a day (BID) | ORAL | Status: DC | PRN
  Administered 2022-09-17 – 2022-09-25 (×5): 650 mg via ORAL
  Filled 2022-09-14 (×5): qty 2

## 2022-09-14 NOTE — Progress Notes (Signed)
Progress Note   Patient: [name] [name] WUJ:[id number] DOB: 18-Aug-1940 DOA: 09/13/2022     0 DOS: the patient was seen and examined on 09/14/2022   Brief hospital admission narrative: As per H&P written by Dr. Thomes Dinning on 09/13/2022 [name] [name] is a 82 y.o. male with medical history significant of hypertension, hyperlipidemia, T2DM, dementia, anxiety who presents to the emergency department due to concern with constipation.  Patient states that home health nurse tried to disimpact him by doing a rectal exam, he states that he has not had a bowel movement in 2 days.  Patient also of not being able to urinate within the last 2 days.  Apparently, patient has been having poor food intake, but has been drinking a lot of fluids.  Patient was reported to be more confused than baseline Patient was admitted from 8/15 to 8/23 due to right femoral neck fracture s/p ORIF by orthopedics.   ED Course:  In the emergency department, patient was tachycardic, BP was 142/63, other vital signs were within normal range.  Workup in the ED showed leukocytosis and normocytic anemia, platelets 518 BMP was normal except for chloride of 96, and blood glucose 176, BUN/creatinine 67/1.98 (baseline creatinine at 0.8-0.9), albumin 2.6. CT abdomen and pelvis with contrast showed porcelain gallbladder with no evidence of acute cholecystitis, no bowel obstruction or ileus. CT head without contrast showed no acute intracranial process Chest x-ray showed no active cardiopulmonary disease IV hydration was provided.  Hospitalist was asked to admit patient for further evaluation and management.  Assessment and Plan: 1-acute kidney injury -Appears to be prerenal azotemia from dehydration, decreased oral intake and also postrenal in the setting of bladder outlet obstruction -Patient received Foley catheter placement with almost 2 L of urine removed. -Renal function improving adequately with supportive care and fluid resuscitation -Will  continue to follow trend, continue to maintain adequate hydration and minimize the use of nephrotoxic agents.  2-bladder outlet obstruction -No prior history of BPH reported -Continue Foley catheter placement; if blood pressure allows-will initiate Flomax nightly and arrange for urology follow-up as an outpatient.  3-anemia of chronic disease -No overt bleeding appreciated -Continue to follow hemoglobin trend -Currently we will 10.6.  4-hypertension -Blood pressure soft currently; we will continue holding antihypertensive agents at the moment and provide fluid resuscitation -Heart healthy diet has been recommended.  5-mixed hyperlipidemia -Continue statin.  6-type 2 diabetes mellitus with hyperglycemia/hypoglycemia -Associated with insulin usage and decreased oral intake as part of hypoglycemia process. -Patient has been experiencing elevated blood sugars recently prior to admission -Continue to follow CBGs fluctuation and adjust regimen as needed -Continue sliding scale insulin.  7-physical deconditioning -Follow recommendation by physical therapy; patient will benefit of skilled nursing facility placement for rehabilitation.  8-advance care planning/goals of care discussion -Palliative care has been consulted; appreciate assistance and recommendation -At the moment patient is DNR/DNI -Continue supportive care and provide treatment of acute conditions. -Interested in short-term rehabilitation if recommended by PT.  9-stage II pressure injury in his sacrum -Continue constant repositioning and preventive measures -Pressure injury present at time of admission without signs of superimposed infection.  10-leukocytosis -In the setting of a stress demargination/dehydration -Trending down adequately -No acute source of infection appreciated -Continue fluid resuscitation and follow WBC trend  11-hypokalemia -Replete electrolytes and follow trend. -Magnesium within normal  limits.  Subjective:  Somnolent, chronically ill in appearance, weak and deconditioned.  Reports no chest pain or shortness of breath currently.  Patient is afebrile.  Family  member at bedside expressing decreased appetite.  Physical Exam: Vitals:   09/13/22 2325 09/14/22 0000 09/14/22 0053 09/14/22 0533  BP:  (!) 92/53 (!) 101/59 (!) 145/55  Pulse:  99 84 96  Resp:  (!) 21 18 19   Temp: 97.6 F (36.4 C)  98.1 F (36.7 C) 98 F (36.7 C)  TempSrc: Oral  Oral   SpO2:  100% 100% 96%  Weight:   63 kg   Height:   5' 9.02" (1.753 m)    General exam: Somnolent but easily aroused and able to follow simple commands.  No chest pain, no nausea, no vomiting, no shortness of breath.  Chronically ill in appearance. Respiratory system: Clear to auscultation. Respiratory effort normal.  Good saturation on room air. Cardiovascular system:RRR. No rubs or gallops; no JVD. Gastrointestinal system/GU: Abdomen is nondistended, soft and nontender.  Positive bowel sounds.  Foley catheter in place Central nervous system: Alert and oriented. No focal neurological deficits. Extremities: No cyanosis or clubbing. Skin: No petechiae.  Stage II pressure injury in his sacrum; present at time of admission without signs of superimposed infection. Psychiatry: Flat affect.  Data Reviewed: Comprehensive metabolic panel: Sodium 138, potassium 3.3, chloride 102, bicarb 32, BUN 48, creatinine 1.37, normal LFTs; GFR 52 Magnesium: 2.0 Phosphorus: 3.4 CBC: WBCs 16.9, hemoglobin 9.4 and platelet count 452K.  Family Communication: Wife at bedside.  Disposition: Status is: Inpatient Remains inpatient appropriate because: Continue IV fluids.  Follow renal function trend and clinical response.   Planned Discharge Destination: Skilled nursing facility   Time spent: 50 minutes  Author: Vassie Loll, MD 09/14/2022 4:40 PM  For on call review [email or web address].
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
## 2022-09-14 NOTE — TOC CM/SW Note (Signed)
Transition of Care [facility]) - Inpatient Brief Assessment   Patient Details  Name: [name] [name] MRN: [id number] Date of Birth: 24-Jul-1940  Transition of Care [facility]) CM/SW Contact:    Villa Herb, LCSWA Phone Number: 09/14/2022, 1:14 PM   Clinical Narrative: PT eval is pending at this time. TOC to follow for needs per PT recommendation. CSW updated by [facility] with [facility] that they are not able to accept pt back for their services. TOC to follow.   Transition of Care Asessment: Insurance and Status: Insurance coverage has been reviewed Patient has primary care physician: Yes Home environment has been reviewed: from home Prior level of function:: needs assistance Prior/Current Home Services: No current home services Social Determinants of Health Reivew: SDOH reviewed no interventions necessary Readmission risk has been reviewed: Yes Transition of care needs: no transition of care needs at this time

## 2022-09-15 ENCOUNTER — Inpatient Hospital Stay: Payer: PPO | Admitting: Family Medicine

## 2022-09-15 DIAGNOSIS — E1165 Type 2 diabetes mellitus with hyperglycemia: Secondary | ICD-10-CM | POA: Diagnosis not present

## 2022-09-15 DIAGNOSIS — N179 Acute kidney failure, unspecified: Secondary | ICD-10-CM | POA: Diagnosis not present

## 2022-09-15 DIAGNOSIS — N320 Bladder-neck obstruction: Secondary | ICD-10-CM | POA: Diagnosis not present

## 2022-09-15 DIAGNOSIS — E782 Mixed hyperlipidemia: Secondary | ICD-10-CM | POA: Diagnosis not present

## 2022-09-15 LAB — BASIC METABOLIC PANEL
Anion gap: 11 (ref 5–15)
BUN: 21 mg/dL (ref 8–23)
CO2: 28 mmol/L (ref 22–32)
Calcium: 8.5 mg/dL — ABNORMAL LOW (ref 8.9–10.3)
Chloride: 97 mmol/L — ABNORMAL LOW (ref 98–111)
Creatinine, Ser: 0.78 mg/dL (ref 0.61–1.24)
GFR, Estimated: >60 mL/min (ref >60)
Glucose, Bld: 111 mg/dL — ABNORMAL HIGH (ref 70–99)
Potassium: 3.5 mmol/L (ref 3.5–5.1)
Sodium: 136 mmol/L (ref 135–145)

## 2022-09-15 LAB — GLUCOSE, CAPILLARY
Glucose-Capillary: 98 mg/dL (ref 70–99)
Glucose-Capillary: 155 mg/dL — ABNORMAL HIGH (ref 70–99)
Glucose-Capillary: 153 mg/dL — ABNORMAL HIGH (ref 70–99)
Glucose-Capillary: 170 mg/dL — ABNORMAL HIGH (ref 70–99)

## 2022-09-15 MED ORDER — NYSTATIN 100000 UNIT/GM EX POWD
Freq: Two times a day (BID) | CUTANEOUS | Status: DC
  Filled 2022-09-15 (×3): qty 15

## 2022-09-15 MED ORDER — TAMSULOSIN HCL 0.4 MG PO CAPS
0.4000 mg | ORAL_CAPSULE | Freq: Every day | ORAL | Status: DC
  Administered 2022-09-15 – 2022-09-24 (×10): 0.4 mg via ORAL
  Filled 2022-09-15 (×10): qty 1

## 2022-09-15 NOTE — [place] FL2 (Signed)
[place] MEDICAID FL2 LEVEL OF CARE FORM     IDENTIFICATION  Patient Name: [name] [name] Birthdate: 05-Oct-1940 Sex: male Admission Date (Current Location): 09/13/2022  [place] and [place] Number:  [name] Edelman [id number] [facility] Facility and Address:  [facility],  [address], [place] [address]      Provider Number: [id number]  Attending Physician Name and Address:  Vassie Loll, MD  Relative Name and Phone Number:  Taiga, Gundy (Spouse)  [phone] (    Current Level of Care: Hospital Recommended Level of Care: Skilled Nursing Facility Prior Approval Number:    Date Approved/Denied:   PASRR Number: [id number]  Discharge Plan: SNF    Current Diagnoses: Patient Active Problem List   Diagnosis Date Noted   Bladder outlet obstruction 09/14/2022   Hypoalbuminemia due to protein-calorie malnutrition (HCC) 09/14/2022   Leukocytosis 09/14/2022   Thrombocytosis 09/14/2022   Anemia of chronic disease 09/14/2022   Essential hypertension 09/14/2022   Type 2 diabetes mellitus with hyperglycemia (HCC) 09/14/2022   AKI (acute kidney injury) (HCC) 09/14/2022   Acute kidney injury (HCC) 09/13/2022   Dehydration 08/31/2022   Dementia (HCC) 08/31/2022   Fall at home, initial encounter 08/31/2022   Hip fracture (HCC) 08/30/2022   Vitamin D deficiency 05/17/2020   Cerebrovascular accident (CVA) (HCC) 05/17/2020   Carotid stenosis 06/15/2016   Carotid disease, bilateral (HCC) 04/12/2015   History of colonic polyps    Iron deficiency anemia 12/21/2013   Smoker 12/21/2013   Anxiety    Diabetes mellitus (HCC)    Diabetes mellitus type 2, uncomplicated (HCC)    Mixed hyperlipidemia    Hypertension    Cellulitis of foot, left 08/09/2010    Orientation RESPIRATION BLADDER Height & Weight     Time  Normal Incontinent Weight: 138 lb 14.2 oz (63 kg) Height:  5' 9.02" (175.3 cm)  BEHAVIORAL SYMPTOMS/MOOD NEUROLOGICAL BOWEL NUTRITION STATUS      Incontinent Diet (heart  healthy/carb modified)  AMBULATORY STATUS COMMUNICATION OF NEEDS Skin   Extensive Assist Verbally PU Stage and Appropriate Care (sacrum)                       Personal Care Assistance Level of Assistance  Bathing, Feeding, Dressing Bathing Assistance: Limited assistance Feeding assistance: Limited assistance Dressing Assistance: Limited assistance     Functional Limitations Info  Sight, Hearing, Speech Sight Info: Adequate Hearing Info: Adequate Speech Info: Adequate    SPECIAL CARE FACTORS FREQUENCY  PT (By licensed PT)     PT Frequency: 5x/week              Contractures Contractures Info: Not present    Additional Factors Info  Code Status, Allergies, Psychotropic Code Status Info: DNR-Limited Allergies Info: Invokana, Plavix Psychotropic Info: n/a         Current Medications (09/15/2022):  This is the current hospital active medication list Current Facility-Administered Medications  Medication Dose Route Frequency Provider Last Rate Last Admin   0.9 %  sodium chloride infusion   Intravenous Continuous Vassie Loll, MD 75 mL/hr at 09/15/22 0633 New Bag at 09/15/22 8413   acetaminophen (TYLENOL) tablet 650 mg  650 mg Oral BID PRN Ulice Bold, NP       Or   acetaminophen (TYLENOL) suppository 650 mg  650 mg Rectal Q6H PRN Ulice Bold, NP       Chlorhexidine Gluconate Cloth 2 % PADS 6 each  6 each Topical Daily Vassie Loll, MD   6 each  at 09/15/22 0816   enoxaparin (LOVENOX) injection 40 mg  40 mg Subcutaneous Q24H Earnie Larsson, RPH   40 mg at 09/15/22 0816   feeding supplement (GLUCERNA SHAKE) (GLUCERNA SHAKE) liquid 237 mL  237 mL Oral TID BM Adefeso, Oladapo, DO   237 mL at 09/15/22 0819   HYDROcodone-acetaminophen (NORCO/VICODIN) 5-325 MG per tablet 1 tablet  1 tablet Oral Q8H PRN Ulice Bold, NP   1 tablet at 09/15/22 1056   insulin aspart (novoLOG) injection 0-15 Units  0-15 Units Subcutaneous TID WC Adefeso, Oladapo, DO   3 Units at  09/15/22 1254   insulin aspart (novoLOG) injection 0-5 Units  0-5 Units Subcutaneous QHS Adefeso, Oladapo, DO       insulin glargine-yfgn (SEMGLEE) injection 10 Units  10 Units Subcutaneous QHS Adefeso, Oladapo, DO   10 Units at 09/14/22 2222   ondansetron (ZOFRAN) tablet 4 mg  4 mg Oral Q6H PRN Adefeso, Oladapo, DO       Or   ondansetron (ZOFRAN) injection 4 mg  4 mg Intravenous Q6H PRN Adefeso, Oladapo, DO       rosuvastatin (CRESTOR) tablet 40 mg  40 mg Oral Daily Adefeso, Oladapo, DO   40 mg at 09/15/22 4332     Discharge Medications: Please see discharge summary for a list of discharge medications.  Relevant Imaging Results:  Relevant Lab Results:   Additional Information SSN [id number]  Annice Needy, LCSW

## 2022-09-15 NOTE — Plan of Care (Signed)
  Problem: Activity: Goal: Ability to ambulate and perform ADLs will improve Outcome: Progressing   Problem: Clinical Measurements: Goal: Ability to maintain clinical measurements within normal limits will improve Outcome: Progressing   Problem: Clinical Measurements: Goal: Will remain free from infection Outcome: Progressing   Problem: Clinical Measurements: Goal: Diagnostic test results will improve Outcome: Progressing   Problem: Activity: Goal: Risk for activity intolerance will decrease Outcome: Progressing

## 2022-09-15 NOTE — Progress Notes (Signed)
Progress Note   Patient: [name] [name] ZOX:[id number] DOB: 14-Jul-1940 DOA: 09/13/2022     1 DOS: the patient was seen and examined on 09/15/2022   Brief hospital admission narrative: As per H&P written by Dr. Thomes Dinning on 09/13/2022 [name] [name] is a 82 y.o. male with medical history significant of hypertension, hyperlipidemia, T2DM, dementia, anxiety who presents to the emergency department due to concern with constipation.  Patient states that home health nurse tried to disimpact him by doing a rectal exam, he states that he has not had a bowel movement in 2 days.  Patient also of not being able to urinate within the last 2 days.  Apparently, patient has been having poor food intake, but has been drinking a lot of fluids.  Patient was reported to be more confused than baseline Patient was admitted from 8/15 to 8/23 due to right femoral neck fracture s/p ORIF by orthopedics.   ED Course:  In the emergency department, patient was tachycardic, BP was 142/63, other vital signs were within normal range.  Workup in the ED showed leukocytosis and normocytic anemia, platelets 518 BMP was normal except for chloride of 96, and blood glucose 176, BUN/creatinine 67/1.98 (baseline creatinine at 0.8-0.9), albumin 2.6. CT abdomen and pelvis with contrast showed porcelain gallbladder with no evidence of acute cholecystitis, no bowel obstruction or ileus. CT head without contrast showed no acute intracranial process Chest x-ray showed no active cardiopulmonary disease IV hydration was provided.  Hospitalist was asked to admit patient for further evaluation and management.  Assessment and Plan: 1-acute kidney injury -Appears to be prerenal azotemia from dehydration, decreased oral intake and also with postrenal component in the setting of bladder outlet obstruction -Renal function improved/resolved after fluid resuscitation and Foley catheter placement. -Will continue to follow trend, continue to maintain adequate  hydration and minimize the use of nephrotoxic agents.  2-bladder outlet obstruction -No prior history of BPH reported -Continue Foley catheter placement; outpatient follow-up with urology service recommended. -Low-dose Flomax will be started.  3-anemia of chronic disease -Continue to follow hemoglobin trend -Current hemoglobin 10.6. -No signs of overt bleeding.  4-hypertension -Blood pressure has stabilized and rising -Low-dose Flomax will be started -Follow vital signs.  5-mixed hyperlipidemia -Continue statin.  6-type 2 diabetes mellitus with hyperglycemia/hypoglycemia -Associated with insulin usage and decreased oral intake as part of hypoglycemia process. -Patient has been experiencing elevated blood sugars recently prior to admission -Continue to follow CBGs fluctuation and adjust regimen as needed -Continue sliding scale insulin.  7-physical deconditioning -Patient has been seen by physical therapy with recommendations for short-term rehabilitation at a skilled nursing facility. -TOC aware and will follow assistance for discharge plans.  8-advance care planning/goals of care discussion -Palliative care has been consulted; appreciate assistance and recommendation -At the moment patient is DNR/DNI. -Continue supportive care and provide treatment of acute conditions. -Interested in short-term rehabilitation if recommended by PT.  9-stage II pressure injury in his sacrum -Continue constant repositioning and preventive measures -Pressure injury present at time of admission without signs of superimposed infection.  10-leukocytosis -In the setting of a stress demargination/dehydration -Trending down adequately -No acute source of infection appreciated -Continue fluid resuscitation and follow WBC trend -Continue to hold on antibiotic regimen.  11-hypokalemia -Continue to follow ultralights and further replete as needed -Potassium 3.5 currently.  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  Subjective:  1 alert, following commands appropriately; no fever.  Chronically ill, weak and deconditioned.  Foley catheter in place, no hematuria or dysuria reported.  Physical Exam: Vitals:   09/14/22 0053 09/14/22 0533 09/14/22 2240 09/15/22 1239  BP: (!) 101/59 (!) 145/55 (!) 116/53 (!) 123/59  Pulse: 84 96 85 92  Resp: 18 19 16 17   Temp: 98.1 F (36.7 C) 98 F (36.7 C) 98.3 F (36.8 C) 98.5 F (36.9 C)  TempSrc: Oral  Oral Oral  SpO2: 100% 96% 98% 96%  Weight: 63 kg     Height: 5' 9.02" (1.753 m)      General exam: Awake, oriented x 2; intermittently confused but definitely more alert, able to follow simple commands and in no acute distress. Respiratory system: Good air movement bilaterally; no using accessory muscle.  Good saturation on room air. Cardiovascular system:RRR. No rubs or gallops; no JVD. Gastrointestinal system: Abdomen is nondistended, soft and nontender. No organomegaly or masses felt. Normal bowel sounds heard. Central nervous system: Generally weak.  No focal neurological deficits. Extremities: No cyanosis or clubbing. Skin: No petechiae; patient with erythematous changes in his groin and scrotum and from fungal dermatitis.  Foley catheter in place.  Stage II pressure injury appreciated in his sacrum; no signs of superimposed infection. Psychiatry: Judgement and insight appear impaired secondary to underlying dementia; no hallucinations.  No agitation.   Data Reviewed: Comprehensive metabolic panel: Sodium 136, potassium 3.5, chloride 97, bicarb 28, BUN 21, creatinine 0.78 and GFR >60 Magnesium: 2.0 Phosphorus: 3.4 CBC: WBCs 16.9, hemoglobin 9.4 and platelet count 452K.  Family Communication: Wife at bedside.  Disposition: Status is: Inpatient  Remains inpatient appropriate because: Continue IV fluids.  Follow renal function trend and clinical response.   Planned Discharge  Destination: Skilled nursing facility   Time spent: 50 minutes  Author: Vassie Loll, MD 09/15/2022 5:51 PM  For on call review [email or web address].

## 2022-09-15 NOTE — TOC Progression Note (Addendum)
Transition of Care [facility]) - Progression Note    Patient Details  Name: [name] [name] MRN: [id number] Date of Birth: 12/07/1940  [facility] of [facility]) CM/SW Contact  Annice Needy, LCSW Phone Number: 09/15/2022, 3:43 PM  Clinical Narrative:    PT recommends SNF. [name] is agreeable to SNF placement. Prefers for patient to stay in [place] for SNF. Referred to SNFs in [place]. HTA authorization started.     Expected Discharge Plan: Skilled Nursing Facility Barriers to Discharge: Continued Medical Work up  Expected Discharge Plan and Services                                               Social Determinants of Health (SDOH) Interventions SDOH Screenings   Food Insecurity: Patient Unable To Answer (09/14/2022)  Housing: Patient Unable To Answer (09/14/2022)  Transportation Needs: Patient Unable To Answer (09/14/2022)  Utilities: Patient Unable To Answer (09/14/2022)  Alcohol Screen: Low Risk  (11/05/2021)  Depression (PHQ2-9): High Risk (11/05/2021)  Financial Resource Strain: Low Risk  (11/05/2021)  Physical Activity: Insufficiently Active (11/05/2021)  Social Connections: Moderately Integrated (11/05/2021)  Stress: No Stress Concern Present (11/05/2021)  Tobacco Use: Medium Risk (09/14/2022)    Readmission Risk Interventions     No data to display

## 2022-09-15 NOTE — Evaluation (Signed)
Physical Therapy Evaluation Patient Details Name: [name] [name] MRN: [id number] DOB: 12/22/1940 Today's Date: 09/15/2022  History of Present Illness  [name] [name] is a 83 y.o. male with medical history significant of hypertension, hyperlipidemia, T2DM, dementia, anxiety who presents to the emergency department due to concern with constipation.  Patient states that home health nurse tried to disimpact him by doing a rectal exam, he states that he has not had a bowel movement in 2 days.  Patient also of not being able to urinate within the last 2 days.  Apparently, patient has been having poor food intake, but has been drinking a lot of fluids.  Patient was reported to be more confused than baseline  Patient was admitted from 8/15 to 8/23 due to right femoral neck fracture s/p ORIF by orthopedics.   Clinical Impression  Patient demonstrates slow labored movement for sitting up at bedside, once seated had frequent falling backwards, required constant verbal/tactile cueing for safety due to extreme fear of falling, poor carryover for using RW and only able to partially stand with bilateral hand held assist.  Patient required Mod/max assist to reposition when put back to bed - his spouse present throughout visit.  Patient will benefit from continued skilled physical therapy in hospital and recommended venue below to increase strength, balance, endurance for safe ADLs and gait.          If plan is discharge home, recommend the following: A lot of help with walking and/or transfers;Help with stairs or ramp for entrance;A lot of help with bathing/dressing/bathroom;Assistance with cooking/housework   Can travel by private vehicle   No    Equipment Recommendations Rolling walker (2 wheels);BSC/3in1;Wheelchair (measurements PT);Wheelchair cushion (measurements PT);Other (comment)  Recommendations for Other Services       Functional Status Assessment Patient has had a recent decline in their functional  status and demonstrates the ability to make significant improvements in function in a reasonable and predictable amount of time.     Precautions / Restrictions Precautions Precautions: Fall;Posterior Hip Restrictions Weight Bearing Restrictions: Yes RLE Weight Bearing: Weight bearing as tolerated      Mobility  Bed Mobility Overal bed mobility: Needs Assistance Bed Mobility: Supine to Sit, Sit to Supine     Supine to sit: Max assist Sit to supine: Max assist   General bed mobility comments: required repeated verbal/tactile cueing for following instructions    Transfers Overall transfer level: Needs assistance Equipment used: Rolling walker (2 wheels) Transfers: Sit to/from Stand Sit to Stand: Max assist           General transfer comment: limitd to partial standing using RW due to BLE weakness and apprehension    Ambulation/Gait                  Stairs            Wheelchair Mobility     Tilt Bed    Modified Rankin (Stroke Patients Only)       Balance Overall balance assessment: Needs assistance Sitting-balance support: Feet supported, No upper extremity supported Sitting balance-Leahy Scale: Poor Sitting balance - Comments: frequent falling backwards Postural control: Posterior lean Standing balance support: Reliant on assistive device for balance, During functional activity Standing balance-Leahy Scale: Poor Standing balance comment: partial standing with bilateral hand held assist                             Pertinent Vitals/Pain Pain Assessment  Pain Assessment: Faces Faces Pain Scale: Hurts little more Pain Location: BLE with movement Pain Descriptors / Indicators: Grimacing, Guarding, Sore Pain Intervention(s): Limited activity within patient's tolerance, Monitored during session, Repositioned    Home Living Family/patient expects to be discharged to:: Private residence Living Arrangements: Spouse/significant  other Available Help at Discharge: Family;Available 24 hours/day Type of Home: House Home Access: Stairs to enter   [employer] of Steps: 1   Home Layout: One level Home Equipment: Agricultural consultant (2 wheels);Hand held shower head;Shower seat;Cane - single point;Grab bars - tub/shower;Wheelchair - manual      Prior Function Prior Level of Function : Needs assist       Physical Assist : Mobility (physical);ADLs (physical) Mobility (physical): Bed mobility;Transfers;Gait;Stairs   Mobility Comments: Assisted household ambulator using RW ADLs Comments: self feed, otherwise assisted for bathing, dressing, toileting and all IADLs     Extremity/Trunk Assessment   Upper Extremity Assessment Upper Extremity Assessment: Generalized weakness    Lower Extremity Assessment Lower Extremity Assessment: Generalized weakness    Cervical / Trunk Assessment Cervical / Trunk Assessment: Kyphotic  Communication   Communication Cueing Techniques: Verbal cues;Tactile cues  Cognition Arousal: Alert Behavior During Therapy: Agitated, Anxious Overall Cognitive Status: History of cognitive impairments - at baseline                                          General Comments      Exercises     Assessment/Plan    PT Assessment Patient needs continued PT services  PT Problem List Decreased strength;Decreased activity tolerance;Decreased balance;Decreased mobility       PT Treatment Interventions DME instruction;Gait training;Stair training;Functional mobility training;Therapeutic activities;Therapeutic exercise;Balance training;Patient/family education    PT Goals (Current goals can be found in the Care Plan section)  Acute Rehab PT Goals Patient Stated Goal: return home with family to assist PT Goal Formulation: With patient/family Time For Goal Achievement: 09/29/22 Potential to Achieve Goals: Fair    Frequency Min 3X/week     Co-evaluation                AM-PAC PT "6 Clicks" Mobility  Outcome Measure Help needed turning from your back to your side while in a flat bed without using bedrails?: A Lot Help needed moving from lying on your back to sitting on the side of a flat bed without using bedrails?: A Lot Help needed moving to and from a bed to a chair (including a wheelchair)?: Total Help needed standing up from a chair using your arms (e.g., wheelchair or bedside chair)?: A Lot Help needed to walk in hospital room?: Total Help needed climbing 3-5 steps with a railing? : Total 6 Click Score: 9    End of Session   Activity Tolerance: Patient limited by fatigue;Treatment limited secondary to agitation;Patient tolerated treatment well Patient left: in bed;with call bell/phone within reach;with family/visitor present Nurse Communication: Mobility status PT Visit Diagnosis: Unsteadiness on feet (R26.81);Muscle weakness (generalized) (M62.81);Other abnormalities of gait and mobility (R26.89);Pain Pain - Right/Left: Right Pain - part of body: Hip    Time: 9562-1308 PT Time Calculation (min) (ACUTE ONLY): 24 min   Charges:   PT Evaluation $PT Eval Moderate Complexity: 1 Mod PT Treatments $Therapeutic Activity: 23-37 mins PT General Charges $$ ACUTE PT VISIT: 1 Visit         2:45 PM, 09/15/22 Ocie Bob, MPT Physical  Therapist with [name] [name] [facility] [phone] office [phone] mobile phone

## 2022-09-15 NOTE — Plan of Care (Signed)
  Problem: Acute Rehab PT Goals(only PT should resolve) Goal: Pt Will Go Supine/Side To Sit Outcome: Progressing Flowsheets (Taken 09/15/2022 1446) Pt will go Supine/Side to Sit:  with moderate assist  with minimal assist Goal: Patient Will Transfer Sit To/From Stand Outcome: Progressing Flowsheets (Taken 09/15/2022 1446) Patient will transfer sit to/from stand: with moderate assist Goal: Pt Will Transfer Bed To Chair/Chair To Bed Outcome: Progressing Flowsheets (Taken 09/15/2022 1446) Pt will Transfer Bed to Chair/Chair to Bed: with mod assist Goal: Pt Will Ambulate Outcome: Progressing Flowsheets (Taken 09/15/2022 1446) Pt will Ambulate:  25 feet  with moderate assist  with rolling walker   2:46 PM, 09/15/22 Ocie Bob, MPT Physical Therapist with [facility] [phone] office [phone] mobile phone

## 2022-09-15 NOTE — Care Management Important Message (Signed)
Important Message  Patient Details  Name: FAOLAN HONKOMP MRN: [id number] Date of Birth: 07-04-1940   Medicare Important Message Given:  N/A - LOS <3 / Initial given by admissions     Corey Harold 09/15/2022, 11:56 AM

## 2022-09-16 ENCOUNTER — Telehealth: Payer: Self-pay | Admitting: Family Medicine

## 2022-09-16 DIAGNOSIS — R339 Retention of urine, unspecified: Secondary | ICD-10-CM

## 2022-09-16 DIAGNOSIS — E8809 Other disorders of plasma-protein metabolism, not elsewhere classified: Secondary | ICD-10-CM

## 2022-09-16 DIAGNOSIS — E46 Unspecified protein-calorie malnutrition: Secondary | ICD-10-CM

## 2022-09-16 DIAGNOSIS — D72829 Elevated white blood cell count, unspecified: Secondary | ICD-10-CM | POA: Diagnosis not present

## 2022-09-16 DIAGNOSIS — N179 Acute kidney failure, unspecified: Secondary | ICD-10-CM | POA: Diagnosis not present

## 2022-09-16 LAB — GLUCOSE, CAPILLARY
Glucose-Capillary: 100 mg/dL — ABNORMAL HIGH (ref 70–99)
Glucose-Capillary: 77 mg/dL (ref 70–99)
Glucose-Capillary: 211 mg/dL — ABNORMAL HIGH (ref 70–99)
Glucose-Capillary: 85 mg/dL (ref 70–99)
Glucose-Capillary: 156 mg/dL — ABNORMAL HIGH (ref 70–99)

## 2022-09-16 NOTE — TOC Progression Note (Addendum)
Transition of Care [facility]) - Progression Note    Patient Details  Name: [name] [name] MRN: [id number] Date of Birth: 07-26-1940  Transition of Care [facility]) CM/SW Contact  Elliot Gault, LCSW Phone Number: 09/16/2022, 3:50 PM  Clinical Narrative:     TOC following. Bed offers presented to pt's wife who selects [facility]. Updated Tami at [facility]. Awaiting auth decision from pt's insurance. Updated MD. Will follow up tomorrow.  Expected Discharge Plan: Skilled Nursing Facility Barriers to Discharge: Continued Medical Work up  Expected Discharge Plan and Services                                               Social Determinants of Health (SDOH) Interventions SDOH Screenings   Food Insecurity: Patient Unable To Answer (09/14/2022)  Housing: Patient Unable To Answer (09/14/2022)  Transportation Needs: Patient Unable To Answer (09/14/2022)  Utilities: Patient Unable To Answer (09/14/2022)  Alcohol Screen: Low Risk  (11/05/2021)  Depression (PHQ2-9): High Risk (11/05/2021)  Financial Resource Strain: Low Risk  (11/05/2021)  Physical Activity: Insufficiently Active (11/05/2021)  Social Connections: Moderately Integrated (11/05/2021)  Stress: No Stress Concern Present (11/05/2021)  Tobacco Use: Medium Risk (09/14/2022)    Readmission Risk Interventions     No data to display

## 2022-09-16 NOTE — Progress Notes (Signed)
Progress Note   Patient: [name] [name] UUV:[id number] DOB: 05-15-1940 DOA: 09/13/2022     2 DOS: the patient was seen and examined on 09/16/2022   Brief hospital admission narrative: As per H&P written by Dr. Thomes Dinning on 09/13/2022 [name] [name] is a 82 y.o. male with medical history significant of hypertension, hyperlipidemia, T2DM, dementia, anxiety who presents to the emergency department due to concern with constipation.  Patient states that home health nurse tried to disimpact him by doing a rectal exam, he states that he has not had a bowel movement in 2 days.  Patient also of not being able to urinate within the last 2 days.  Apparently, patient has been having poor food intake, but has been drinking a lot of fluids.  Patient was reported to be more confused than baseline Patient was admitted from 8/15 to 8/23 due to right femoral neck fracture s/p ORIF by orthopedics.   ED Course:  In the emergency department, patient was tachycardic, BP was 142/63, other vital signs were within normal range.  Workup in the ED showed leukocytosis and normocytic anemia, platelets 518 BMP was normal except for chloride of 96, and blood glucose 176, BUN/creatinine 67/1.98 (baseline creatinine at 0.8-0.9), albumin 2.6. CT abdomen and pelvis with contrast showed porcelain gallbladder with no evidence of acute cholecystitis, no bowel obstruction or ileus. CT head without contrast showed no acute intracranial process Chest x-ray showed no active cardiopulmonary disease IV hydration was provided.  Hospitalist was asked to admit patient for further evaluation and management.  Assessment and Plan: 1-acute kidney injury -Appears to be prerenal azotemia from dehydration, decreased oral intake and also with postrenal component in the setting of bladder outlet obstruction -Renal function improved/resolved after fluid resuscitation and Foley catheter placement. -Will continue to follow trend, continue to maintain adequate  hydration and continue to minimize the use of nephrotoxic agents.  2-bladder outlet obstruction -No prior history of BPH reported -Continue Foley catheter placement; outpatient follow-up with urology service recommended. -Low-dose Flomax will be started.  3-anemia of chronic disease -Continue to follow hemoglobin trend -Current hemoglobin 10.6. -No signs of overt bleeding.  4-hypertension -Blood pressure has stabilized and rising -Low-dose Flomax will be started -Follow vital signs.  5-mixed hyperlipidemia -Continue statin.  6-type 2 diabetes mellitus with hyperglycemia/hypoglycemia -Associated with insulin usage and decreased oral intake as part of hypoglycemia process. -Patient has been experiencing elevated blood sugars recently prior to admission -Continue to follow CBGs fluctuation and adjust regimen as needed -Continue sliding scale insulin.  7-physical deconditioning -Patient has been seen by physical therapy with recommendations for short-term rehabilitation at a skilled nursing facility. -TOC aware and will follow assistance for discharge plans.  8-advance care planning/goals of care discussion -Palliative care has been consulted; appreciate assistance and recommendation -At the moment patient is DNR/DNI. -Continue supportive care and provide treatment of acute conditions. -Interested in short-term rehabilitation if recommended by PT.  9-stage II pressure injury in his sacrum -Continue constant repositioning and preventive measures -Pressure injury present at time of admission without signs of superimposed infection.  10-leukocytosis -In the setting of a stress demargination/dehydration -Trending down adequately -No acute source of infection appreciated -Continue fluid resuscitation and follow WBC trend -Continue to hold on antibiotic regimen.  11-hypokalemia -Continue to follow ultralights and further replete as needed -Potassium 3.5 currently.  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  Subjective:  Chronically ill in appearance; no acute distress.  Following commands appropriately.  No nausea, no vomiting and currently afebrile and demonstrating good saturation on room air.  Patient is weak and deconditioned.  Physical Exam: Vitals:   09/15/22 1239 09/15/22 2131 09/16/22 0320 09/16/22 1312  BP: (!) 123/59 (!) 123/56 139/62 (!) 155/64  Pulse: 92 83 91 94  Resp: 17 20 20 16   Temp: 98.5 F (36.9 C) 98.2 F (36.8 C) 98.5 F (36.9 C) 97.9 F (36.6 C)  TempSrc: Oral Oral Oral   SpO2: 96% 98% 98% 98%  Weight:      Height:       General exam: Alert, awake, following commands appropriately and refusing some of his medications (specifically Lovenox shot). Respiratory system: Good air movement laterally; no using accessory muscle.  Good saturation on room air. Cardiovascular system:RRR. No rubs or gallops.  No JVD Gastrointestinal system/GU: Abdomen is nondistended, soft and nontender. No organomegaly or masses felt. Normal bowel sounds heard.  Foley catheter in place. Central nervous system: Generally weak and deconditioned no focal neurological deficits. Extremities: No cyanosis or clubbing. Skin: No petechiae; stage II pressure injury appreciated in his sacrum present at time of admission without signs of superimposed infection.  Fungal dermatitis appreciated in his groin and scrotum (less of red). Psychiatry: Mood & affect appropriate.    Latest data Reviewed: Comprehensive metabolic panel: Sodium 136, potassium 3.5, chloride 97, bicarb 28, BUN 21, creatinine 0.78 and GFR >60 Magnesium: 2.0 Phosphorus: 3.4 CBC: WBCs 16.9, hemoglobin 9.4 and platelet count 452K.  Family Communication: Wife at bedside.  Disposition: Status is: Inpatient  Remains inpatient appropriate because: Continue IV fluids.  Follow renal function trend and clinical response.   Planned Discharge Destination: Skilled  nursing facility    Time spent: 50 minutes  Author: Vassie Loll, MD 09/16/2022 2:41 PM  For on call review [email or web address].

## 2022-09-16 NOTE — Telephone Encounter (Signed)
Prescription Request  09/16/2022  LOV: 06/25/2022  What is the name of the medication or equipment?   HYDROcodone-acetaminophen (NORCO/VICODIN) 5-325 MG per tablet 1 tablet   Have you contacted your pharmacy to request a refill? Yes   Which pharmacy would you like this sent to?  [id number] - [place], [place] - [name]  [place] [address] Phone: [phone] Fax: [phone]    Patient notified that their request is being sent to the clinical staff for review and that they should receive a response within 2 business days.   Please advise patient's wife Rene Kocher at [phone].

## 2022-09-16 NOTE — Care Management Important Message (Signed)
Important Message  Patient Details  Name: [name] [name] MRN: [id number] Date of Birth: 06/29/1940   Medicare Important Message Given:  N/A - LOS <3 / Initial given by admissions     [name] [name] 09/16/2022, 10:00 AM

## 2022-09-17 ENCOUNTER — Other Ambulatory Visit: Payer: Self-pay | Admitting: Family Medicine

## 2022-09-17 DIAGNOSIS — R339 Retention of urine, unspecified: Secondary | ICD-10-CM | POA: Diagnosis not present

## 2022-09-17 DIAGNOSIS — E8809 Other disorders of plasma-protein metabolism, not elsewhere classified: Secondary | ICD-10-CM | POA: Diagnosis not present

## 2022-09-17 DIAGNOSIS — D72829 Elevated white blood cell count, unspecified: Secondary | ICD-10-CM | POA: Diagnosis not present

## 2022-09-17 DIAGNOSIS — N179 Acute kidney failure, unspecified: Secondary | ICD-10-CM | POA: Diagnosis not present

## 2022-09-17 LAB — GLUCOSE, CAPILLARY
Glucose-Capillary: 157 mg/dL — ABNORMAL HIGH (ref 70–99)
Glucose-Capillary: 126 mg/dL — ABNORMAL HIGH (ref 70–99)
Glucose-Capillary: 115 mg/dL — ABNORMAL HIGH (ref 70–99)

## 2022-09-17 MED ORDER — SODIUM CHLORIDE 0.9% FLUSH
3.0000 mL | Freq: Two times a day (BID) | INTRAVENOUS | Status: DC
  Administered 2022-09-17 – 2022-09-25 (×16): 3 mL via INTRAVENOUS

## 2022-09-17 MED ORDER — HYDROCODONE-ACETAMINOPHEN 5-325 MG PO TABS: 1.0000 | ORAL_TABLET | Freq: Four times a day (QID) | ORAL | 0 refills | Status: DC | PRN

## 2022-09-17 MED ORDER — SODIUM CHLORIDE 0.9 % IV SOLN: 250.0000 mL | INTRAVENOUS | Status: DC | PRN

## 2022-09-17 MED ORDER — SODIUM CHLORIDE 0.9% FLUSH: 3.0000 mL | INTRAVENOUS | Status: DC | PRN

## 2022-09-17 NOTE — Progress Notes (Signed)
Pt has had better day today, more cooperative. Pt has eaten well, fed self. Right leg edema much improved from yesterday, no weeping noted from abrasion on right lower leg. Pt asked to sit up on side of bed today and was cooperative to staff assist. Pt would not stand or get into recliner but did sit on side of bed unassisted for 10 minutes before asking to lie back down. Pt has refused all offers of pain medication today. Foley cath intact with clear yellow urine.

## 2022-09-17 NOTE — Progress Notes (Signed)
Progress Note   Patient: [name] [name]:[id number] DOB: [birth year] DOA: 09/13/2022     3 DOS: the patient was seen and examined on 09/17/2022   Brief hospital admission narrative: As per H&P written by Dr. Thomes Dinning on 09/13/2022 [name] [name] is a 82 y.o. male with medical history significant of hypertension, hyperlipidemia, T2DM, dementia, anxiety who presents to the emergency department due to concern with constipation.  Patient states that home health nurse tried to disimpact him by doing a rectal exam, he states that he has not had a bowel movement in 2 days.  Patient also of not being able to urinate within the last 2 days.  Apparently, patient has been having poor food intake, but has been drinking a lot of fluids.  Patient was reported to be more confused than baseline Patient was admitted from 8/15 to 8/23 due to right femoral neck fracture s/p ORIF by orthopedics.   ED Course:  In the emergency department, patient was tachycardic, BP was 142/63, other vital signs were within normal range.  Workup in the ED showed leukocytosis and normocytic anemia, platelets 518 BMP was normal except for chloride of 96, and blood glucose 176, BUN/creatinine 67/1.98 (baseline creatinine at 0.8-0.9), albumin 2.6. CT abdomen and pelvis with contrast showed porcelain gallbladder with no evidence of acute cholecystitis, no bowel obstruction or ileus. CT head without contrast showed no acute intracranial process Chest x-ray showed no active cardiopulmonary disease IV hydration was provided.  Hospitalist was asked to admit patient for further evaluation and management.  Assessment and Plan: 1-acute kidney injury -Appears to be prerenal azotemia from dehydration, decreased oral intake and also with postrenal component in the setting of bladder outlet obstruction -Renal function improved/resolved after fluid resuscitation and Foley catheter placement. -Will continue to follow trend, continue to maintain adequate  hydration and continue minimizing the use of nephrotoxic agents as much as possible. -Renal function within normal limits now.  2-bladder outlet obstruction -No prior history of BPH reported -Continue Foley catheter placement; outpatient follow-up with urology service recommended. -Low-dose Flomax has been started on so far with good tolerance.  3-anemia of chronic disease -Continue to follow hemoglobin trend -Current hemoglobin 10.6. -No signs of overt bleeding.  4-hypertension -Blood pressure has stabilized and rising -Low-dose Flomax will be started; so far well-tolerated. -Continue to follow vital signs and adjust antihypertensive regimen as needed.  5-mixed hyperlipidemia -Continue statin.  6-type 2 diabetes mellitus with hyperglycemia/hypoglycemia -Associated with insulin usage and decreased oral intake as part of hypoglycemia process. -Patient has been experiencing elevated blood sugars recently prior to admission -Continue to follow CBGs fluctuation and adjust regimen as needed -Continue sliding scale insulin.  7-physical deconditioning -Patient has been seen by physical therapy with recommendations for short-term rehabilitation at a skilled nursing facility. -TOC aware and will follow assistance for discharge plans.  8-advance care planning/goals of care discussion -Palliative care has been consulted; appreciate assistance and recommendation -At the moment patient is DNR/DNI. -Continue supportive care and provide treatment of acute conditions. -Skilled nursing facility recommended by PT for short-term rehab station.  Family in agreement. -Awaiting insurance authorization.  9-stage II pressure injury in his sacrum -Continue constant repositioning and preventive measures -Pressure injury present at time of admission without signs of superimposed infection.  10-leukocytosis -In the setting of a stress demargination/dehydration -Trending down adequately -No acute  source of infection appreciated -Continue to maintain adequate hydration. -WBCs has stabilized and demonstrating no acute component of infection.  11-hypokalemia -Continue to follow  ultralights and further replete as needed -Potassium 3.5 currently.  12-fungal dermatitis -Topical nystatin has been prescribed -Continue to keep area clean and dry.  Subjective:  Chronically ill in appearance; in no acute distress and expressing no chest pain, no nausea, no vomiting.  Patient is weak and deconditioned in comparison to his baseline as discussed with wife at bedside.  Physical Exam: Vitals:   09/16/22 0320 09/16/22 1312 09/16/22 2135 09/17/22 0611  BP: 139/62 (!) 155/64 (!) 153/70 (!) 153/74  Pulse: 91 94 86 60  Resp: 20 16 18 18   Temp: 98.5 F (36.9 C) 97.9 F (36.6 C) 98.2 F (36.8 C) 98.6 F (37 C)  TempSrc: Oral     SpO2: 98% 98% 98% 92%  Weight:      Height:       General exam: Alert, awake, oriented x 1; intermittently confused, but able to follow simple commands and demonstrating no agitation or combativeness. Respiratory system: Good air movement bilaterally; no using accessory muscle.  Good saturation on room air. Cardiovascular system:RRR.  No rubs or gallops; no JVD. Gastrointestinal system/GU: Abdomen is nondistended, soft and nontender. No organomegaly or masses felt. Normal bowel sounds heard.  Foley catheter in place; no blood appreciated in Foley bag. Central nervous system: Moving 4 limbs spontaneously; no focal deficit. Extremities: No cyanosis or clubbing; no lower extremity edema present on exam. Skin: No petechiae.  Stage II pressure injury appreciated in his sacrum present at time of admission without signs of superimposed infection.  Fungal dermatitis appreciated in his groin and scrotum (continue improving and adequately healing). Psychiatry: Judgement and insight appear impaired secondary to underlying dementia; appropriate mood.  Latest data  Reviewed: Comprehensive metabolic panel: Sodium 136, potassium 3.5, chloride 97, bicarb 28, BUN 21, creatinine 0.78 and GFR >60 Magnesium: 2.0 Phosphorus: 3.4 CBC: WBCs 16.9, hemoglobin 9.4 and platelet count 452K.  Family Communication: Wife at bedside.  Disposition: Status is: Inpatient  Remains inpatient appropriate because: Continue IV fluids.  Follow renal function trend and clinical response.   Planned Discharge Destination: Skilled nursing facility    Time spent: 50 minutes  Author: Vassie Loll, MD 09/17/2022 6:34 PM  For on call review [email or web address].

## 2022-09-17 NOTE — Plan of Care (Signed)
  Problem: Education: Goal: Verbalization of understanding the information provided (i.e., activity precautions, restrictions, etc) will improve Outcome: Progressing Goal: Individualized Educational Video(s) Outcome: Progressing   Problem: Activity: Goal: Ability to ambulate and perform ADLs will improve Outcome: Progressing   Problem: Clinical Measurements: Goal: Postoperative complications will be avoided or minimized Outcome: Progressing   Problem: Self-Concept: Goal: Ability to maintain and perform role responsibilities to the fullest extent possible will improve Outcome: Progressing   Problem: Pain Management: Goal: Pain level will decrease Outcome: Progressing   Problem: Education: Goal: Knowledge of General Education information will improve Description: Including pain rating scale, medication(s)/side effects and non-pharmacologic comfort measures Outcome: Progressing   Problem: Health Behavior/Discharge Planning: Goal: Ability to manage health-related needs will improve Outcome: Progressing   Problem: Clinical Measurements: Goal: Ability to maintain clinical measurements within normal limits will improve Outcome: Progressing Goal: Will remain free from infection Outcome: Progressing Goal: Diagnostic test results will improve Outcome: Progressing Goal: Respiratory complications will improve Outcome: Progressing Goal: Cardiovascular complication will be avoided Outcome: Progressing   Problem: Activity: Goal: Risk for activity intolerance will decrease Outcome: Progressing   Problem: Nutrition: Goal: Adequate nutrition will be maintained Outcome: Progressing   Problem: Coping: Goal: Level of anxiety will decrease Outcome: Progressing   Problem: Elimination: Goal: Will not experience complications related to bowel motility Outcome: Progressing Goal: Will not experience complications related to urinary retention Outcome: Progressing   Problem: Pain  Managment: Goal: General experience of comfort will improve Outcome: Progressing   Problem: Safety: Goal: Ability to remain free from injury will improve Outcome: Progressing   Problem: Skin Integrity: Goal: Risk for impaired skin integrity will decrease Outcome: Progressing   Problem: Education: Goal: Ability to describe self-care measures that may prevent or decrease complications (Diabetes Survival Skills Education) will improve Outcome: Progressing Goal: Individualized Educational Video(s) Outcome: Progressing   Problem: Coping: Goal: Ability to adjust to condition or change in health will improve Outcome: Progressing   Problem: Fluid Volume: Goal: Ability to maintain a balanced intake and output will improve Outcome: Progressing   Problem: Health Behavior/Discharge Planning: Goal: Ability to identify and utilize available resources and services will improve Outcome: Progressing Goal: Ability to manage health-related needs will improve Outcome: Progressing   Problem: Metabolic: Goal: Ability to maintain appropriate glucose levels will improve Outcome: Progressing   Problem: Nutritional: Goal: Maintenance of adequate nutrition will improve Outcome: Progressing Goal: Progress toward achieving an optimal weight will improve Outcome: Progressing   Problem: Skin Integrity: Goal: Risk for impaired skin integrity will decrease Outcome: Progressing   Problem: Tissue Perfusion: Goal: Adequacy of tissue perfusion will improve Outcome: Progressing   

## 2022-09-18 DIAGNOSIS — N179 Acute kidney failure, unspecified: Secondary | ICD-10-CM | POA: Diagnosis not present

## 2022-09-18 DIAGNOSIS — E8809 Other disorders of plasma-protein metabolism, not elsewhere classified: Secondary | ICD-10-CM | POA: Diagnosis not present

## 2022-09-18 DIAGNOSIS — D72829 Elevated white blood cell count, unspecified: Secondary | ICD-10-CM | POA: Diagnosis not present

## 2022-09-18 DIAGNOSIS — R339 Retention of urine, unspecified: Secondary | ICD-10-CM | POA: Diagnosis not present

## 2022-09-18 LAB — GLUCOSE, CAPILLARY
Glucose-Capillary: 314 mg/dL — ABNORMAL HIGH (ref 70–99)
Glucose-Capillary: 154 mg/dL — ABNORMAL HIGH (ref 70–99)
Glucose-Capillary: 140 mg/dL — ABNORMAL HIGH (ref 70–99)
Glucose-Capillary: 191 mg/dL — ABNORMAL HIGH (ref 70–99)
Glucose-Capillary: 179 mg/dL — ABNORMAL HIGH (ref 70–99)

## 2022-09-18 NOTE — Progress Notes (Signed)
Physical Therapy Treatment Patient Details Name: [name] [name] MRN: [id number] DOB: 06-24-1940 Today's Date: 09/18/2022   History of Present Illness [name] [name] is a 82 y.o. male with medical history significant of hypertension, hyperlipidemia, T2DM, dementia, anxiety who presents to the emergency department due to concern with constipation.  Patient states that home health nurse tried to disimpact him by doing a rectal exam, he states that he has not had a bowel movement in 2 days.  Patient also of not being able to urinate within the last 2 days.  Apparently, patient has been having poor food intake, but has been drinking a lot of fluids.  Patient was reported to be more confused than baseline  Patient was admitted from 8/15 to 8/23 due to right femoral neck fracture s/p ORIF by orthopedics.    PT Comments  Patient required repeated verbal/tactile cueing for participating with therapy.  Patient demonstrates slow labored movement for sitting up at bedside, once seated able to keep trunk in midline most of time, had occasional posterior leaning, very unsteady on feet and limited to a few side steps due to increased right knee pain with knees buckling.  Patient required active assistance for completing most exercises while seated in chair and tolerated staying up in chair after therapy with his spouse present in room - nursing staff aware.  Patient will benefit from continued skilled physical therapy in hospital and recommended venue below to increase strength, balance, endurance for safe ADLs and gait.     If plan is discharge home, recommend the following: A lot of help with walking and/or transfers;Help with stairs or ramp for entrance;A lot of help with bathing/dressing/bathroom;Assistance with cooking/housework   Can travel by private vehicle     No  Equipment Recommendations  None recommended by PT    Recommendations for Other Services       Precautions / Restrictions  Precautions Precautions: Fall Restrictions Weight Bearing Restrictions: Yes     Mobility  Bed Mobility Overal bed mobility: Needs Assistance Bed Mobility: Supine to Sit     Supine to sit: Mod assist, Max assist     General bed mobility comments: increased time, labored movement    Transfers Overall transfer level: Needs assistance Equipment used: Rolling walker (2 wheels) Transfers: Sit to/from Stand, Bed to chair/wheelchair/BSC Sit to Stand: Mod assist, Max assist   Step pivot transfers: Mod assist, Max assist       General transfer comment: unsteady labored movement    Ambulation/Gait Ambulation/Gait assistance: Mod assist, Max assist Gait Distance (Feet): 3 Feet Assistive device: Rolling walker (2 wheels) Gait Pattern/deviations: Decreased stride length, Decreased step length - right, Decreased step length - left, Knees buckling Gait velocity: slow     General Gait Details: limited to a few slow labored side steps with buckling of knees and c/o increased right knee pain   Stairs             Wheelchair Mobility     Tilt Bed    Modified Rankin (Stroke Patients Only)       Balance Overall balance assessment: Needs assistance Sitting-balance support: Feet supported, No upper extremity supported Sitting balance-Leahy Scale: Poor Sitting balance - Comments: fair/poor seated at EOB Postural control: Posterior lean Standing balance support: Reliant on assistive device for balance, During functional activity, Bilateral upper extremity supported Standing balance-Leahy Scale: Poor Standing balance comment: using RW  Cognition Arousal: Alert Behavior During Therapy: Anxious Overall Cognitive Status: History of cognitive impairments - at baseline                                 General Comments: requires repeated verbal/tactile for participating with therapy        Exercises General Exercises -  Lower Extremity Ankle Circles/Pumps: Seated, AROM, Strengthening, Both, 10 reps Long Arc Quad: Seated, AROM, AAROM, Strengthening, Both, 10 reps Hip Flexion/Marching: Seated, AAROM, Strengthening, Both, 10 reps, AROM    General Comments        Pertinent Vitals/Pain Pain Assessment Pain Assessment: Faces Faces Pain Scale: Hurts little more Pain Location: right knee with movement Pain Descriptors / Indicators: Grimacing, Guarding, Sore Pain Intervention(s): Limited activity within patient's tolerance, Monitored during session, Repositioned    Home Living                          Prior Function            PT Goals (current goals can now be found in the care plan section) Acute Rehab PT Goals Patient Stated Goal: return home with family to assist PT Goal Formulation: With patient/family Time For Goal Achievement: 09/29/22 Potential to Achieve Goals: Fair Progress towards PT goals: Progressing toward goals    Frequency    Min 3X/week      PT Plan      Co-evaluation              AM-PAC PT "6 Clicks" Mobility   Outcome Measure  Help needed turning from your back to your side while in a flat bed without using bedrails?: A Lot Help needed moving from lying on your back to sitting on the side of a flat bed without using bedrails?: A Lot Help needed moving to and from a bed to a chair (including a wheelchair)?: A Lot Help needed standing up from a chair using your arms (e.g., wheelchair or bedside chair)?: A Lot Help needed to walk in hospital room?: A Lot Help needed climbing 3-5 steps with a railing? : Total 6 Click Score: 11    End of Session   Activity Tolerance: Patient tolerated treatment well;Patient limited by fatigue;Patient limited by pain Patient left: in chair;with call bell/phone within reach;with family/visitor present Nurse Communication: Mobility status PT Visit Diagnosis: Unsteadiness on feet (R26.81);Muscle weakness (generalized)  (M62.81);Other abnormalities of gait and mobility (R26.89);Pain Pain - Right/Left: Right Pain - part of body: Knee     Time: 1610-9604 PT Time Calculation (min) (ACUTE ONLY): 20 min  Charges:    $Therapeutic Exercise: 8-22 mins $Therapeutic Activity: 8-22 mins PT General Charges $$ ACUTE PT VISIT: 1 Visit                     10:16 AM, 09/18/22 Ocie Bob, MPT Physical Therapist with [facility] [phone] office [phone] mobile phone

## 2022-09-18 NOTE — TOC Progression Note (Signed)
Transition of Care [facility]) - Progression Note    Patient Details  Name: [name] [name] MRN: [id number] Date of Birth: 1940-04-14  Transition of Care [facility]) CM/SW Contact  Villa Herb, [place] Phone Number: 09/18/2022, 9:22 AM  Clinical Narrative:    TOC updated that pts insurance [employer] has denied SNF stay. CSW updated pts wife of this, she states they are unable to pay privately for SNF. CSW explained that the options outside of that would be to return home or she can appeal the denial. Pts wife will complete fast appeal, CSW provided number to wife to call and start appeal. TOC to follow.   Expected Discharge Plan: Skilled Nursing Facility Barriers to Discharge: Insurance Authorization  Expected Discharge Plan and Services                                               Social Determinants of Health (SDOH) Interventions SDOH Screenings   Food Insecurity: Patient Unable To Answer (09/14/2022)  Housing: Patient Unable To Answer (09/14/2022)  Transportation Needs: Patient Unable To Answer (09/14/2022)  Utilities: Patient Unable To Answer (09/14/2022)  Alcohol Screen: Low Risk  (11/05/2021)  Depression (PHQ2-9): High Risk (11/05/2021)  Financial Resource Strain: Low Risk  (11/05/2021)  Physical Activity: Insufficiently Active (11/05/2021)  Social Connections: Moderately Integrated (11/05/2021)  Stress: No Stress Concern Present (11/05/2021)  Tobacco Use: Medium Risk (09/14/2022)    Readmission Risk Interventions     No data to display

## 2022-09-18 NOTE — Care Management Important Message (Signed)
Important Message  Patient Details  Name: [name] [name] MRN: [id number] Date of Birth: 1940-01-25   Medicare Important Message Given:  Yes     Corey Harold 09/18/2022, 12:33 PM

## 2022-09-18 NOTE — Progress Notes (Signed)
Progress Note   Patient: [name] [name]:[id number] DOB: October 22, 1940 DOA: 09/13/2022     4 DOS: the patient was seen and examined on 09/18/2022   Brief hospital admission narrative: As per H&P written by Dr. Thomes Dinning on 09/13/2022 [name] [name] is a 82 y.o. male with medical history significant of hypertension, hyperlipidemia, T2DM, dementia, anxiety who presents to the emergency department due to concern with constipation.  Patient states that home health nurse tried to disimpact him by doing a rectal exam, he states that he has not had a bowel movement in 2 days.  Patient also of not being able to urinate within the last 2 days.  Apparently, patient has been having poor food intake, but has been drinking a lot of fluids.  Patient was reported to be more confused than baseline Patient was admitted from 8/15 to 8/23 due to right femoral neck fracture s/p ORIF by orthopedics.   ED Course:  In the emergency department, patient was tachycardic, BP was 142/63, other vital signs were within normal range.  Workup in the ED showed leukocytosis and normocytic anemia, platelets 518 BMP was normal except for chloride of 96, and blood glucose 176, BUN/creatinine 67/1.98 (baseline creatinine at 0.8-0.9), albumin 2.6. CT abdomen and pelvis with contrast showed porcelain gallbladder with no evidence of acute cholecystitis, no bowel obstruction or ileus. CT head without contrast showed no acute intracranial process Chest x-ray showed no active cardiopulmonary disease IV hydration was provided.  Hospitalist was asked to admit patient for further evaluation and management.  Hemodynamically stable and clinically ready for discharge to SNF.  Insurance has denied authorization and the family is appealing.  Assessment and Plan: 1-acute kidney injury -Appears to be prerenal azotemia from dehydration, decreased oral intake and also with postrenal component in the setting of bladder outlet obstruction -Renal function  improved/resolved after fluid resuscitation and Foley catheter placement. -Will continue to follow trend, continue to maintain adequate hydration and continue minimizing the use of nephrotoxic agents as much as possible. -Renal function within normal limits now.  2-bladder outlet obstruction -No prior history of BPH reported -Continue Foley catheter placement; outpatient follow-up with urology service recommended. -Low-dose Flomax has been started on so far with good tolerance.  3-anemia of chronic disease -Continue to follow hemoglobin trend -Current hemoglobin 10.6. -No signs of overt bleeding.  4-hypertension -Blood pressure has stabilized and rising -Low-dose Flomax will be started; so far well-tolerated. -Continue to follow vital signs and adjust antihypertensive regimen as needed.  5-mixed hyperlipidemia -Continue statin.  6-type 2 diabetes mellitus with hyperglycemia/hypoglycemia -Associated with insulin usage and decreased oral intake as part of hypoglycemia process. -Patient has been experiencing elevated blood sugars recently prior to admission -Continue to follow CBGs fluctuation and adjust regimen as needed -Continue sliding scale insulin.  7-physical deconditioning -Patient has been seen by physical therapy with recommendations for short-term rehabilitation at a skilled nursing facility. -TOC aware and will follow assistance for discharge plans. -Medically stable and ready for discharge to skilled nursing facility as recommended to PT for rehabilitation trial.  Family appealing as patient has been denied by insurance authorization.  8-advance care planning/goals of care discussion -Palliative care has been consulted; appreciate assistance and recommendation -At the moment patient is DNR/DNI. -Continue supportive care and provide treatment of acute conditions. -Skilled nursing facility recommended by PT for short-term rehab station.  Family in agreement. -Awaiting  insurance authorization.  9-stage II pressure injury in his sacrum -Continue constant repositioning and preventive measures -Pressure injury  present at time of admission without signs of superimposed infection.  10-leukocytosis -In the setting of a stress demargination/dehydration -Trending down adequately -No acute source of infection appreciated -Continue to maintain adequate hydration. -WBCs has stabilized and demonstrating no acute component of infection.  11-hypokalemia -Continue to follow electrolytes trend and further replete as needed.  12-fungal dermatitis -Continue use of topical nystatin to affected area. -Continue to keep area clean and dry.  Subjective:  Chronically ill in appearance; in no acute distress and without any significant overnight events.  After discussing with patient's husband at bedside I have been informed that insurance company has denied authorization for rehabilitation.  Appealing is in process.  Patient is hemodynamically stable.  Physical Exam: Vitals:   09/17/22 1700 09/17/22 2013 09/18/22 0545 09/18/22 1242  BP: 136/63 (!) 142/59 121/60 (!) 141/62  Pulse: 96 (!) 103 87 80  Resp: 18 20 18 18   Temp: 99.2 F (37.3 C) 99.3 F (37.4 C) 99.1 F (37.3 C) 98.7 F (37.1 C)  TempSrc: Oral Oral  Oral  SpO2:  97% 96% 97%  Weight:      Height:       General exam: Alert, awake, oriented x 1; recognizes wife and follow simple commands.  At times refusing physical therapy participation and treatment by nursing staff, but able to be convinced, reoriented and engaged into task.  No fever, no chest pain, no nausea, no vomiting, no overt bleeding. Respiratory system: Clear to auscultation. Respiratory effort normal.  Good duration of longer. Cardiovascular system:RRR. No rubs or lobes. Gastrointestinal system: Abdomen is nondistended, soft and nontender. No organomegaly or masses felt. Normal bowel sounds heard. GU: Foley catheter in place. Central nervous  system: No focal neurological deficits. Extremities: No cyanosis or clubbing. Skin: No petechiae; erythematous changes from fungal dermatitis in his groin and scrotum has continued improving and is pretty much completely clear.  Stage II pressure injury in his sacrum present at time of admission without signs of superimposed infection. Psychiatry: Judgement and insight appear impaired secondary to dementia.  Latest data Reviewed: Comprehensive metabolic panel: Sodium 136, potassium 3.5, chloride 97, bicarb 28, BUN 21, creatinine 0.78 and GFR >60 Magnesium: 2.0 Phosphorus: 3.4 CBC: WBCs 16.9, hemoglobin 9.4 and platelet count 452K.  Family Communication: Wife at bedside.  Disposition: Status is: Inpatient  Remains inpatient appropriate because: Continue IV fluids.  Follow renal function trend and clinical response.   Planned Discharge Destination: Skilled nursing facility   Time spent: 50 minutes  Author: Vassie Loll, MD 09/18/2022 3:15 PM  For on call review [email or web address].

## 2022-09-19 DIAGNOSIS — D72829 Elevated white blood cell count, unspecified: Secondary | ICD-10-CM | POA: Diagnosis not present

## 2022-09-19 DIAGNOSIS — N179 Acute kidney failure, unspecified: Secondary | ICD-10-CM | POA: Diagnosis not present

## 2022-09-19 DIAGNOSIS — E08649 Diabetes mellitus due to underlying condition with hypoglycemia without coma: Secondary | ICD-10-CM | POA: Diagnosis not present

## 2022-09-19 DIAGNOSIS — E8809 Other disorders of plasma-protein metabolism, not elsewhere classified: Secondary | ICD-10-CM | POA: Diagnosis not present

## 2022-09-19 DIAGNOSIS — R339 Retention of urine, unspecified: Secondary | ICD-10-CM | POA: Diagnosis not present

## 2022-09-19 LAB — CBC
HCT: 28.3 % — ABNORMAL LOW (ref 39.0–52.0)
Hemoglobin: 9.4 g/dL — ABNORMAL LOW (ref 13.0–17.0)
MCH: 31.1 pg (ref 26.0–34.0)
MCHC: 33.2 g/dL (ref 30.0–36.0)
MCV: 93.7 fL (ref 80.0–100.0)
Platelets: 471 10*3/uL — ABNORMAL HIGH (ref 150–400)
RBC: 3.02 MIL/uL — ABNORMAL LOW (ref 4.22–5.81)
RDW: 12.6 % (ref 11.5–15.5)
WBC: 11.9 10*3/uL — ABNORMAL HIGH (ref 4.0–10.5)
nRBC: 0 % (ref 0.0–0.2)

## 2022-09-19 LAB — GLUCOSE, CAPILLARY
Glucose-Capillary: 173 mg/dL — ABNORMAL HIGH (ref 70–99)
Glucose-Capillary: 285 mg/dL — ABNORMAL HIGH (ref 70–99)
Glucose-Capillary: 209 mg/dL — ABNORMAL HIGH (ref 70–99)
Glucose-Capillary: 108 mg/dL — ABNORMAL HIGH (ref 70–99)

## 2022-09-19 NOTE — Progress Notes (Signed)
Progress Note   Patient: [name] [name] ZOX:[id number] DOB: Mar 23, 1940 DOA: 09/13/2022     5 DOS: the patient was seen and examined on 09/19/2022   Brief hospital admission narrative: As per H&P written by Dr. Thomes Dinning on 09/13/2022 [name] [name] is a 82 y.o. male with medical history significant of hypertension, hyperlipidemia, T2DM, dementia, anxiety who presents to the emergency department due to concern with constipation.  Patient states that home health nurse tried to disimpact him by doing a rectal exam, he states that he has not had a bowel movement in 2 days.  Patient also of not being able to urinate within the last 2 days.  Apparently, patient has been having poor food intake, but has been drinking a lot of fluids.  Patient was reported to be more confused than baseline Patient was admitted from 8/15 to 8/23 due to right femoral neck fracture s/p ORIF by orthopedics.   ED Course:  In the emergency department, patient was tachycardic, BP was 142/63, other vital signs were within normal range.  Workup in the ED showed leukocytosis and normocytic anemia, platelets 518 BMP was normal except for chloride of 96, and blood glucose 176, BUN/creatinine 67/1.98 (baseline creatinine at 0.8-0.9), albumin 2.6. CT abdomen and pelvis with contrast showed porcelain gallbladder with no evidence of acute cholecystitis, no bowel obstruction or ileus. CT head without contrast showed no acute intracranial process Chest x-ray showed no active cardiopulmonary disease IV hydration was provided.  Hospitalist was asked to admit patient for further evaluation and management.  Hemodynamically stable and clinically ready for discharge to SNF.  Insurance has denied authorization and the family is appealing the process.  TOC on board and helping [place] for discharge needs.  Assessment and Plan: 1-acute kidney injury -Appears to be prerenal azotemia from dehydration, decreased oral intake and also with postrenal component in  the setting of bladder outlet obstruction -Renal function improved/resolved after fluid resuscitation and Foley catheter placement. -Will continue to follow trend, continue to maintain adequate hydration and continue minimizing the use of nephrotoxic agents as much as possible. -Renal function within normal limits now.  2-bladder outlet obstruction -No prior history of BPH reported -Continue Foley catheter placement; outpatient follow-up with urology service recommended. -Low-dose Flomax has been started on so far with good tolerance.  3-anemia of chronic disease -Continue to follow hemoglobin trend -Current hemoglobin 10.6. -No signs of overt bleeding.  4-hypertension -Blood pressure has stabilized and rising -Low-dose Flomax will be started; so far well-tolerated. -Continue to follow vital signs and adjust antihypertensive regimen as needed.  5-mixed hyperlipidemia -Continue statin.  6-type 2 diabetes mellitus with hyperglycemia/hypoglycemia -Associated with insulin usage and decreased oral intake as part of hypoglycemia process. -Patient has been experiencing elevated blood sugars recently prior to admission -Continue to follow CBGs fluctuation and adjust regimen as needed -Continue sliding scale insulin.  7-physical deconditioning -Patient has been seen by physical therapy with recommendations for short-term rehabilitation at a skilled nursing facility. -TOC aware and will follow assistance for discharge plans. -Medically stable and ready for discharge to skilled nursing facility as recommended to PT for rehabilitation trial.  Family appealing as patient has been denied by insurance authorization.  8-advance care planning/goals of care discussion -Palliative care has been consulted; appreciate assistance and recommendation -At the moment patient is DNR/DNI. -Continue supportive care and provide treatment of acute conditions. -Skilled nursing facility recommended by PT for  short-term rehab station.  Family in agreement. -Awaiting insurance authorization.  9-stage II pressure  injury in his sacrum -Continue constant repositioning and preventive measures -Pressure injury present at time of admission without signs of superimposed infection.  10-leukocytosis -In the setting of a stress demargination/dehydration -Trending down adequately -No acute source of infection appreciated -Continue to maintain adequate hydration. -WBCs has stabilized and demonstrating no acute component of infection.  11-hypokalemia -Continue to follow electrolytes trend and further replete as needed.  12-fungal dermatitis -Continue use of topical nystatin to affected area. -Continue to keep area clean and dry.  Subjective:  Chronically ill in appearance; in no acute distress.  No overnight events.  There had not been any chest pain, nausea, vomiting, dysuria, abdominal pain or any other complaints.  Physical Exam: Vitals:   09/18/22 1242 09/18/22 2038 09/19/22 0501 09/19/22 1325  BP: (!) 141/62 (!) 134/41 (!) 137/59 137/70  Pulse: 80 91 96 100  Resp: 18 20 20 18   Temp: 98.7 F (37.1 C) 98.3 F (36.8 C) 98.5 F (36.9 C) 98.5 F (36.9 C)  TempSrc: Oral Oral    SpO2: 97% 97% 97% 100%  Weight:      Height:       General exam: Alert, awake, oriented to person and was able to expressed that he was in the hospital.  Following simple commands.  Chronically ill, deconditioned and per nursing staff at times intermittently confused. Respiratory system: Good air movement bilaterally; no using accessory muscle.  Good saturation on room air. Cardiovascular system:RRR. No rubs or gallops. Gastrointestinal system: Abdomen is nondistended, soft and nontender. No organomegaly or masses felt. Normal bowel sounds heard. Central nervous system: Moving 4 limbs spontaneously.  No focal neurological deficits. Extremities: No cyanosis, clubbing or edema. Skin: No petechiae; stage II pressure  injury in his sacrum present at time of admission without signs of superimposed infection.  Fungal dermatitis essentially completely resolved affecting groin and scrotal area. Psychiatry: Judgement and insight appear impaired secondary to dementia; stable mood.  Latest data Reviewed: Comprehensive metabolic panel: Sodium 136, potassium 3.5, chloride 97, bicarb 28, BUN 21, creatinine 0.78 and GFR >60 Magnesium: 2.0 Phosphorus: 3.4 CBC: WBCs 16.9, hemoglobin 9.4 and platelet count 452K.  Family Communication: Wife at bedside.  Disposition: Status is: Inpatient  Remains inpatient appropriate because: Continue IV fluids.  Follow renal function trend and clinical response.   Planned Discharge Destination: Skilled nursing facility  Time spent: 50 minutes  Author: Vassie Loll, MD 09/19/2022 3:09 PM  For on call review [email or web address].

## 2022-09-20 DIAGNOSIS — N179 Acute kidney failure, unspecified: Secondary | ICD-10-CM | POA: Diagnosis not present

## 2022-09-20 LAB — GLUCOSE, CAPILLARY
Glucose-Capillary: 186 mg/dL — ABNORMAL HIGH (ref 70–99)
Glucose-Capillary: 120 mg/dL — ABNORMAL HIGH (ref 70–99)
Glucose-Capillary: 388 mg/dL — ABNORMAL HIGH (ref 70–99)
Glucose-Capillary: 354 mg/dL — ABNORMAL HIGH (ref 70–99)

## 2022-09-20 NOTE — Progress Notes (Signed)
PROGRESS NOTE    [name] [name]  JXB:[id number] DOB: 04/13/1940 DOA: 09/13/2022 PCP: Donita Brooks, MD   Brief Narrative:    [name] [name] is a 82 y.o. male with medical history significant of hypertension, hyperlipidemia, T2DM, dementia, anxiety who presents to the emergency department due to concern with constipation.  Patient states that home health nurse tried to disimpact him by doing a rectal exam, he states that he has not had a bowel movement in 2 days.  Patient also of not being able to urinate within the last 2 days.  Apparently, patient has been having poor food intake, but has been drinking a lot of fluids.  Patient was reported to be more confused than baseline Patient was admitted from 8/15 to 8/23 due to right femoral neck fracture s/p ORIF by orthopedics.  He was admitted with AKI as well as bladder outlet obstruction and has required Foley catheter placement.  He has been seen by physical therapy with recommendations for short-term rehabilitation placement.  Assessment & Plan:   Principal Problem:   Acute kidney injury (HCC) Active Problems:   Mixed hyperlipidemia   Dehydration   Bladder outlet obstruction   Hypoalbuminemia due to protein-calorie malnutrition (HCC)   Leukocytosis   Thrombocytosis   Anemia of chronic disease   Essential hypertension   Type 2 diabetes mellitus with hyperglycemia (HCC)   AKI (acute kidney injury) (HCC)  Assessment and Plan:  1-acute kidney injury -Appears to be prerenal azotemia from dehydration, decreased oral intake and also with postrenal component in the setting of bladder outlet obstruction -Renal function improved/resolved after fluid resuscitation and Foley catheter placement. -Will continue to follow trend, continue to maintain adequate hydration and continue minimizing the use of nephrotoxic agents as much as possible. -Renal function within normal limits now.   2-bladder outlet obstruction -No prior history of BPH  reported -Continue Foley catheter placement; outpatient follow-up with urology service recommended. -Low-dose Flomax has been started on so far with good tolerance.   3-anemia of chronic disease -Continue to follow hemoglobin trend -Current hemoglobin 10.6. -No signs of overt bleeding.   4-hypertension -Blood pressure has stabilized and rising -Low-dose Flomax will be started; so far well-tolerated. -Continue to follow vital signs and adjust antihypertensive regimen as needed.   5-mixed hyperlipidemia -Continue statin.   6-type 2 diabetes mellitus with hyperglycemia/hypoglycemia -Associated with insulin usage and decreased oral intake as part of hypoglycemia process. -Patient has been experiencing elevated blood sugars recently prior to admission -Continue to follow CBGs fluctuation and adjust regimen as needed -Continue sliding scale insulin.   7-physical deconditioning -Patient has been seen by physical therapy with recommendations for short-term rehabilitation at a skilled nursing facility. -TOC aware and will follow assistance for discharge plans. -Medically stable and ready for discharge to skilled nursing facility as recommended to PT for rehabilitation trial.  Family appealing as patient has been denied by insurance authorization.   8-advance care planning/goals of care discussion -Palliative care has been consulted; appreciate assistance and recommendation -At the moment patient is DNR/DNI. -Continue supportive care and provide treatment of acute conditions. -Skilled nursing facility recommended by PT for short-term rehab station.  Family in agreement. -Awaiting insurance authorization.   9-stage II pressure injury in his sacrum -Continue constant repositioning and preventive measures -Pressure injury present at time of admission without signs of superimposed infection.   10-leukocytosis -In the setting of a stress demargination/dehydration -Trending down  adequately -No acute source of infection appreciated -Continue to maintain adequate  hydration. -WBCs has stabilized and demonstrating no acute component of infection.   11-hypokalemia -Continue to follow electrolytes trend and further replete as needed.   12-fungal dermatitis -Continue use of topical nystatin to affected area. -Continue to keep area clean and dry.    DVT prophylaxis: Lovenox Code Status: Full Family Communication: None at bedside Disposition Plan:  Status is: Inpatient Remains inpatient appropriate because: Anticipating SNF placement.  Consultants:  None  Procedures:  None  Antimicrobials:  Anti-infectives (From admission, onward)    None      Subjective: Patient seen and evaluated today with no new acute complaints or concerns. No acute concerns or events noted overnight.  Objective: Vitals:   09/19/22 0501 09/19/22 1325 09/19/22 2038 09/20/22 0352  BP: (!) 137/59 137/70 132/66 112/62  Pulse: 96 100 91 90  Resp: 20 18 20 20   Temp: 98.5 F (36.9 C) 98.5 F (36.9 C) 98 F (36.7 C) 98 F (36.7 C)  TempSrc:      SpO2: 97% 100% 97% 98%  Weight:      Height:        Intake/Output Summary (Last 24 hours) at 09/20/2022 1132 Last data filed at 09/20/2022 0100 Gross per 24 hour  Intake 240 ml  Output 1650 ml  Net -1410 ml   Filed Weights   09/13/22 1925 09/14/22 0053  Weight: 63 kg 63 kg    Examination:  General exam: Appears calm and comfortable  Respiratory system: Clear to auscultation. Respiratory effort normal. Cardiovascular system: S1 & S2 heard, RRR.  Gastrointestinal system: Abdomen is soft Central nervous system: Alert and awake Extremities: No edema Skin: No significant lesions noted Psychiatry: Flat affect.    Data Reviewed: I have personally reviewed following labs and imaging studies  CBC: Recent Labs  Lab 09/13/22 2154 09/14/22 0530 09/19/22 0440  WBC 18.2* 16.9* 11.9*  NEUTROABS 14.5*  --   --   HGB 10.6* 9.4*  9.4*  HCT 32.8* 29.5* 28.3*  MCV 95.9 96.4 93.7  PLT 518* 452* 471*   Basic Metabolic Panel: Recent Labs  Lab 09/13/22 2154 09/14/22 0530 09/15/22 0416  NA 135 138 136  K 4.0 3.3* 3.5  CL 96* 102 97*  CO2 26 32 28  GLUCOSE 176* 58* 111*  BUN 67* 48* 21  CREATININE 1.98* 1.37* 0.78  CALCIUM 9.4 9.0 8.5*  MG  --  2.0  --   PHOS  --  3.4  --    GFR: Estimated Creatinine Clearance: 64.5 mL/min (by C-G formula based on SCr of 0.78 mg/dL). Liver Function Tests: Recent Labs  Lab 09/13/22 2154 09/14/22 0530  AST 30 26  ALT 27 21  ALKPHOS 72 61  BILITOT 0.6 0.4  PROT 6.5 5.7*  ALBUMIN 2.6* 2.3*   Recent Labs  Lab 09/13/22 2154  LIPASE 26   No results for input(s): "AMMONIA" in the last 168 hours. Coagulation Profile: No results for input(s): "INR", "PROTIME" in the last 168 hours. Cardiac Enzymes: No results for input(s): "CKTOTAL", "CKMB", "CKMBINDEX", "TROPONINI" in the last 168 hours. BNP (last 3 results) No results for input(s): "PROBNP" in the last 8760 hours. HbA1C: No results for input(s): "HGBA1C" in the last 72 hours. CBG: Recent Labs  Lab 09/19/22 1140 09/19/22 1633 09/19/22 2040 09/20/22 0815 09/20/22 1107  GLUCAP 285* 209* 108* 186* 120*   Lipid Profile: No results for input(s): "CHOL", "HDL", "LDLCALC", "TRIG", "CHOLHDL", "LDLDIRECT" in the last 72 hours. Thyroid Function Tests: No results for input(s): "TSH", "T4TOTAL", "FREET4", "  T3FREE", "THYROIDAB" in the last 72 hours. Anemia Panel: No results for input(s): "VITAMINB12", "FOLATE", "FERRITIN", "TIBC", "IRON", "RETICCTPCT" in the last 72 hours. Sepsis Labs: No results for input(s): "PROCALCITON", "LATICACIDVEN" in the last 168 hours.  No results found for this or any previous visit (from the past 240 hour(s)).       Radiology Studies: No results found.      Scheduled Meds:  Chlorhexidine Gluconate Cloth  6 each Topical Daily   enoxaparin (LOVENOX) injection  40 mg Subcutaneous  Q24H   feeding supplement (GLUCERNA SHAKE)  237 mL Oral TID BM   insulin aspart  0-15 Units Subcutaneous TID WC   insulin aspart  0-5 Units Subcutaneous QHS   insulin glargine-yfgn  10 Units Subcutaneous QHS   nystatin   Topical BID   rosuvastatin  40 mg Oral Daily   sodium chloride flush  3 mL Intravenous Q12H   tamsulosin  0.4 mg Oral QPC supper   Continuous Infusions:  sodium chloride       LOS: 6 days    Time spent: 35 minutes    [name] Capobianco Hoover Brunette, DO [facility]  If 7PM-7AM, please contact night-coverage [email or web address] 09/20/2022, 11:32 AM

## 2022-09-21 ENCOUNTER — Inpatient Hospital Stay (HOSPITAL_COMMUNITY): Payer: PPO

## 2022-09-21 ENCOUNTER — Ambulatory Visit: Payer: PPO | Admitting: Urology

## 2022-09-21 DIAGNOSIS — N179 Acute kidney failure, unspecified: Secondary | ICD-10-CM | POA: Diagnosis not present

## 2022-09-21 LAB — GLUCOSE, CAPILLARY
Glucose-Capillary: 123 mg/dL — ABNORMAL HIGH (ref 70–99)
Glucose-Capillary: 243 mg/dL — ABNORMAL HIGH (ref 70–99)
Glucose-Capillary: 234 mg/dL — ABNORMAL HIGH (ref 70–99)
Glucose-Capillary: 290 mg/dL — ABNORMAL HIGH (ref 70–99)

## 2022-09-21 LAB — CREATININE, SERUM
Creatinine, Ser: 0.63 mg/dL (ref 0.61–1.24)
GFR, Estimated: >60 mL/min (ref >60)

## 2022-09-21 MED ORDER — INSULIN ASPART 100 UNIT/ML IJ SOLN
2.0000 [IU] | Freq: Three times a day (TID) | INTRAMUSCULAR | Status: DC
  Administered 2022-09-21 – 2022-09-25 (×6): 2 [IU] via SUBCUTANEOUS

## 2022-09-21 NOTE — Plan of Care (Signed)
  Problem: Pain Management: Goal: Pain level will decrease Outcome: Progressing   Problem: Clinical Measurements: Goal: Will remain free from infection Outcome: Progressing   Problem: Activity: Goal: Risk for activity intolerance will decrease Outcome: Progressing

## 2022-09-21 NOTE — Progress Notes (Signed)
Physical Therapy Treatment Patient Details Name: [name] [name] MRN: [id number] DOB: 05-15-1940 Today's Date: 09/21/2022   History of Present Illness [name] [name] is a 82 y.o. male with medical history significant of hypertension, hyperlipidemia, T2DM, dementia, anxiety who presents to the emergency department due to concern with constipation.  Patient states that home health nurse tried to disimpact him by doing a rectal exam, he states that he has not had a bowel movement in 2 days.  Patient also of not being able to urinate within the last 2 days.  Apparently, patient has been having poor food intake, but has been drinking a lot of fluids.  Patient was reported to be more confused than baseline  Patient was admitted from 8/15 to 8/23 due to right femoral neck fracture s/p ORIF by orthopedics.    PT Comments  Patient requires repeated verbal/tactile cueing for participating with therapy.  Patient demonstrates improvement for using BUE during supine to sitting and able to keep trunk in midline once seated without posterior leaning.  Patient has difficulty completing sit to stands and limited to a couple of side steps due to buckling of knees and increased right hip, knee pain.  Patient tolerated sitting up in chair after therapy with his spouse present.  Patient will benefit from continued skilled physical therapy in hospital and recommended venue below to increase strength, balance, endurance for safe ADLs and gait.     If plan is discharge home, recommend the following: A lot of help with walking and/or transfers;Help with stairs or ramp for entrance;A lot of help with bathing/dressing/bathroom;Assistance with cooking/housework   Can travel by private vehicle     No  Equipment Recommendations  None recommended by PT    Recommendations for Other Services       Precautions / Restrictions Precautions Precautions: Fall;Posterior Hip Restrictions Weight Bearing Restrictions: Yes RLE Weight  Bearing: Weight bearing as tolerated Other Position/Activity Restrictions: posterior hip precautions     Mobility  Bed Mobility Overal bed mobility: Needs Assistance Bed Mobility: Supine to Sit     Supine to sit: Min assist     General bed mobility comments: improvement for using BUE during supine to sitting    Transfers Overall transfer level: Needs assistance Equipment used: Rolling walker (2 wheels) Transfers: Sit to/from Stand, Bed to chair/wheelchair/BSC Sit to Stand: Max assist, Mod assist   Step pivot transfers: Max assist       General transfer comment: unsteady labored movement with poor tolerance for weightbearing on RLE    Ambulation/Gait Ambulation/Gait assistance: Max assist Gait Distance (Feet): 2 Feet Assistive device: Rolling walker (2 wheels) Gait Pattern/deviations: Decreased stride length, Decreased step length - right, Decreased step length - left, Knees buckling, Shuffle Gait velocity: slow     General Gait Details: limited to a couple of shuffling side steps before having to sit due to loss of balance   Stairs             Wheelchair Mobility     Tilt Bed    Modified Rankin (Stroke Patients Only)       Balance Overall balance assessment: Needs assistance Sitting-balance support: Feet supported, No upper extremity supported Sitting balance-Leahy Scale: Fair Sitting balance - Comments: seated at EOB   Standing balance support: Reliant on assistive device for balance, During functional activity, Bilateral upper extremity supported Standing balance-Leahy Scale: Poor Standing balance comment: using RW  Cognition Arousal: Alert Behavior During Therapy: Anxious, Agitated Overall Cognitive Status: History of cognitive impairments - at baseline                                 General Comments: requires repeated verbal/tactile for participating with therapy        Exercises  General Exercises - Lower Extremity Ankle Circles/Pumps: Seated, AROM, Strengthening, Both, 10 reps Long Arc Quad: Seated, AROM, Strengthening, Both, 10 reps Hip Flexion/Marching: Seated, Strengthening, Both, 10 reps, AROM    General Comments        Pertinent Vitals/Pain Pain Assessment Pain Assessment: Faces Faces Pain Scale: Hurts little more Pain Location: right knee with movement Pain Descriptors / Indicators: Grimacing, Guarding, Sore Pain Intervention(s): Limited activity within patient's tolerance, Monitored during session, Repositioned, Premedicated before session    Home Living                          Prior Function            PT Goals (current goals can now be found in the care plan section) Acute Rehab PT Goals Patient Stated Goal: return home with family to assist PT Goal Formulation: With patient/family Time For Goal Achievement: 09/29/22 Potential to Achieve Goals: Fair Progress towards PT goals: Progressing toward goals    Frequency    Min 3X/week      PT Plan      Co-evaluation              AM-PAC PT "6 Clicks" Mobility   Outcome Measure  Help needed turning from your back to your side while in a flat bed without using bedrails?: A Lot Help needed moving from lying on your back to sitting on the side of a flat bed without using bedrails?: A Lot Help needed moving to and from a bed to a chair (including a wheelchair)?: A Lot Help needed standing up from a chair using your arms (e.g., wheelchair or bedside chair)?: A Lot Help needed to walk in hospital room?: A Lot Help needed climbing 3-5 steps with a railing? : Total 6 Click Score: 11    End of Session   Activity Tolerance: Patient tolerated treatment well;Patient limited by fatigue Patient left: in chair;with call bell/phone within reach;with family/visitor present;with chair alarm set Nurse Communication: Mobility status PT Visit Diagnosis: Unsteadiness on feet  (R26.81);Muscle weakness (generalized) (M62.81);Other abnormalities of gait and mobility (R26.89);Pain Pain - Right/Left: Right Pain - part of body: Knee     Time: 1610-9604 PT Time Calculation (min) (ACUTE ONLY): 25 min  Charges:    $Therapeutic Exercise: 8-22 mins $Therapeutic Activity: 8-22 mins PT General Charges $$ ACUTE PT VISIT: 1 Visit                     1:55 PM, 09/21/22 Ocie Bob, MPT Physical Therapist with [facility] [phone] office [phone] mobile phone

## 2022-09-21 NOTE — TOC Progression Note (Signed)
Transition of Care [facility]) - Progression Note    Patient Details  Name: [name] [name] MRN: [id number] Date of Birth: 01-12-1941  [facility] CM/SW Contact  Elliot Gault, LCSW Phone Number: 09/21/2022, 11:28 AM  Clinical Narrative:     TOC following. Received call from [employer] Appeals rep stating that the initial appeal has determined that pt does not meet criteria for SNF rehab. Rep stated that the appeal has been forwarded to the [employer] ([employer]) for second level independent review. [employer] phone number is [phone]. They have 72 hours to make a determination.  Updated pt's wife who states that they will await the outcome. She states that if if continues to be denied, she will take pt home but will need hospital bed and other DME. Encouraged her to start making room for the DME they will need at home in case this appeal is also denied.  TOC will follow.  Expected Discharge Plan: Skilled Nursing Facility Barriers to Discharge: Insurance Authorization  Expected Discharge Plan and Services                                               Social Determinants of Health (SDOH) Interventions SDOH Screenings   Food Insecurity: Patient Unable To Answer (09/14/2022)  Housing: Patient Unable To Answer (09/14/2022)  Transportation Needs: Patient Unable To Answer (09/14/2022)  Utilities: Patient Unable To Answer (09/14/2022)  Alcohol Screen: Low Risk  (11/05/2021)  Depression (PHQ2-9): High Risk (11/05/2021)  Financial Resource Strain: Low Risk  (11/05/2021)  Physical Activity: Insufficiently Active (11/05/2021)  Social Connections: Moderately Integrated (11/05/2021)  Stress: No Stress Concern Present (11/05/2021)  Tobacco Use: Medium Risk (09/14/2022)    Readmission Risk Interventions     No data to display

## 2022-09-21 NOTE — Inpatient Diabetes Management (Signed)
Inpatient Diabetes Program Recommendations  AACE/ADA: New Consensus Statement on Inpatient Glycemic Control  Target Ranges:  Prepandial:   less than 140 mg/dL      Peak postprandial:   less than 180 mg/dL (1-2 hours)      Critically ill patients:  140 - 180 mg/dL    Latest Reference Range & Units 09/20/22 08:15 09/20/22 11:07 09/20/22 16:41 09/20/22 19:53 09/21/22 07:45  Glucose-Capillary 70 - 99 mg/dL 161 (H) 096 (H) 045 (H) 354 (H) 123 (H)   Review of Glycemic Control  Diabetes history: DM2 Outpatient Diabetes medications: Lantus 30 units daily, Metformin 500 mg BID, Actos 45 mg daily Current orders for Inpatient glycemic control: Semglee 10 units at bedtime, Novolog 0-15 units TID with meals, Novolog 0-5 units QHS  Inpatient Diabetes Program Recommendations:    Insulin: Please consider ordering Novolog 2 units TID with meals for meal coverage if patient eats at least 50% of meals.  Thanks, Orlando Penner, RN, MSN, CDCES Diabetes Coordinator Inpatient Diabetes Program [phone] (Team Pager from 8am to 5pm)

## 2022-09-21 NOTE — Progress Notes (Signed)
PROGRESS NOTE    [name] [name]  XBM:[id number] DOB: Jun 27, 1940 DOA: 09/13/2022 PCP: Donita Brooks, MD   Brief Narrative:    [name] [name] is a 82 y.o. male with medical history significant of hypertension, hyperlipidemia, T2DM, dementia, anxiety who presents to the emergency department due to concern with constipation.  Patient states that home health nurse tried to disimpact him by doing a rectal exam, he states that he has not had a bowel movement in 2 days.  Patient also of not being able to urinate within the last 2 days.  Apparently, patient has been having poor food intake, but has been drinking a lot of fluids.  Patient was reported to be more confused than baseline Patient was admitted from 8/15 to 8/23 due to right femoral neck fracture s/p ORIF by orthopedics.  He was admitted with AKI as well as bladder outlet obstruction and has required Foley catheter placement.  He has been seen by physical therapy with recommendations for short-term rehabilitation placement.  Assessment & Plan:   Principal Problem:   Acute kidney injury (HCC) Active Problems:   Mixed hyperlipidemia   Dehydration   Bladder outlet obstruction   Hypoalbuminemia due to protein-calorie malnutrition (HCC)   Leukocytosis   Thrombocytosis   Anemia of chronic disease   Essential hypertension   Type 2 diabetes mellitus with hyperglycemia (HCC)   AKI (acute kidney injury) (HCC)  Assessment and Plan:  1-acute kidney injury -Appears to be prerenal azotemia from dehydration, decreased oral intake and also with postrenal component in the setting of bladder outlet obstruction -Renal function improved/resolved after fluid resuscitation and Foley catheter placement. -Will continue to follow trend, continue to maintain adequate hydration and continue minimizing the use of nephrotoxic agents as much as possible. -Renal function within normal limits now.   2-bladder outlet obstruction -No prior history of BPH  reported -Continue Foley catheter placement; outpatient follow-up with urology service recommended. -Low-dose Flomax has been started on so far with good tolerance.   3-anemia of chronic disease -Continue to follow hemoglobin trend -Current hemoglobin 10.6. -No signs of overt bleeding.   4-hypertension -Blood pressure has stabilized and rising -Low-dose Flomax will be started; so far well-tolerated. -Continue to follow vital signs and adjust antihypertensive regimen as needed.   5-mixed hyperlipidemia -Continue statin.   6-type 2 diabetes mellitus with hyperglycemia/hypoglycemia -Associated with insulin usage and decreased oral intake as part of hypoglycemia process. -Patient has been experiencing elevated blood sugars recently prior to admission -Continue to follow CBGs fluctuation and adjust regimen as needed -Continue sliding scale insulin.   7-physical deconditioning -Patient has been seen by physical therapy with recommendations for short-term rehabilitation at a skilled nursing facility. -TOC aware and will follow assistance for discharge plans. -Medically stable and ready for discharge to skilled nursing facility as recommended to PT for rehabilitation trial.  Family appealing as patient has been denied by insurance authorization.   8-advance care planning/goals of care discussion -Palliative care has been consulted; appreciate assistance and recommendation -At the moment patient is DNR/DNI. -Continue supportive care and provide treatment of acute conditions. -Skilled nursing facility recommended by PT for short-term rehab station.  Family in agreement. -Awaiting insurance authorization.   9-stage II pressure injury in his sacrum -Continue constant repositioning and preventive measures -Pressure injury present at time of admission without signs of superimposed infection.   10-leukocytosis -In the setting of a stress demargination/dehydration -Trending down  adequately -No acute source of infection appreciated -Continue to maintain adequate  hydration. -WBCs has stabilized and demonstrating no acute component of infection.   11-hypokalemia -Continue to follow electrolytes trend and further replete as needed.   12-fungal dermatitis -Continue use of topical nystatin to affected area. -Continue to keep area clean and dry.    DVT prophylaxis: Lovenox Code Status: Full Family Communication: None at bedside Disposition Plan:  Status is: Inpatient Remains inpatient appropriate because: Anticipating SNF placement.  Consultants:  None  Procedures:  None  Antimicrobials:  Anti-infectives (From admission, onward)    None      Subjective: Patient seen and evaluated today with no new acute complaints or concerns. No acute concerns or events noted overnight.  Objective: Vitals:   09/20/22 1520 09/20/22 1955 09/21/22 0504 09/21/22 1345  BP: (!) 132/52 133/60 (!) 144/64 138/71  Pulse: (!) 101 (!) 102 93 88  Resp: 16 20 16 17   Temp: 98 F (36.7 C) 98.4 F (36.9 C) 98.8 F (37.1 C) 98.5 F (36.9 C)  TempSrc: Oral   Oral  SpO2: 97% 100% 98% 99%  Weight:      Height:        Intake/Output Summary (Last 24 hours) at 09/21/2022 1419 Last data filed at 09/21/2022 1300 Gross per 24 hour  Intake 480 ml  Output 850 ml  Net -370 ml   Filed Weights   09/13/22 1925 09/14/22 0053  Weight: 63 kg 63 kg    Examination:  General exam: Appears calm and comfortable  Respiratory system: Clear to auscultation. Respiratory effort normal. Cardiovascular system: S1 & S2 heard, RRR.  Gastrointestinal system: Abdomen is soft Central nervous system: Alert and awake Extremities: No edema Skin: No significant lesions noted Psychiatry: Flat affect.    Data Reviewed: I have personally reviewed following labs and imaging studies  CBC: Recent Labs  Lab 09/19/22 0440  WBC 11.9*  HGB 9.4*  HCT 28.3*  MCV 93.7  PLT 471*   Basic Metabolic  Panel: Recent Labs  Lab 09/15/22 0416 09/21/22 0517  NA 136  --   K 3.5  --   CL 97*  --   CO2 28  --   GLUCOSE 111*  --   BUN 21  --   CREATININE 0.78 0.63  CALCIUM 8.5*  --    GFR: Estimated Creatinine Clearance: 64.5 mL/min (by C-G formula based on SCr of 0.63 mg/dL). Liver Function Tests: No results for input(s): "AST", "ALT", "ALKPHOS", "BILITOT", "PROT", "ALBUMIN" in the last 168 hours.  No results for input(s): "LIPASE", "AMYLASE" in the last 168 hours.  No results for input(s): "AMMONIA" in the last 168 hours. Coagulation Profile: No results for input(s): "INR", "PROTIME" in the last 168 hours. Cardiac Enzymes: No results for input(s): "CKTOTAL", "CKMB", "CKMBINDEX", "TROPONINI" in the last 168 hours. BNP (last 3 results) No results for input(s): "PROBNP" in the last 8760 hours. HbA1C: No results for input(s): "HGBA1C" in the last 72 hours. CBG: Recent Labs  Lab 09/20/22 1107 09/20/22 1641 09/20/22 1953 09/21/22 0745 09/21/22 1139  GLUCAP 120* 388* 354* 123* 243*   Lipid Profile: No results for input(s): "CHOL", "HDL", "LDLCALC", "TRIG", "CHOLHDL", "LDLDIRECT" in the last 72 hours. Thyroid Function Tests: No results for input(s): "TSH", "T4TOTAL", "FREET4", "T3FREE", "THYROIDAB" in the last 72 hours. Anemia Panel: No results for input(s): "VITAMINB12", "FOLATE", "FERRITIN", "TIBC", "IRON", "RETICCTPCT" in the last 72 hours. Sepsis Labs: No results for input(s): "PROCALCITON", "LATICACIDVEN" in the last 168 hours.  No results found for this or any previous visit (from the past 240  hour(s)).       Radiology Studies: DG Knee 1-2 Views Right  Result Date: 09/21/2022 CLINICAL DATA:  [id number] Knee pain, right [id number] EXAM: RIGHT KNEE - 1-2 VIEW COMPARISON:  08/31/2022 FINDINGS: No fracture or dislocation. No definite effusion. Osteopenia. Scattered arterial calcifications. IMPRESSION: No acute findings. Osteopenia. Electronically Signed   By: Corlis Leak M.D.    On: 09/21/2022 14:12        Scheduled Meds:  Chlorhexidine Gluconate Cloth  6 each Topical Daily   enoxaparin (LOVENOX) injection  40 mg Subcutaneous Q24H   feeding supplement (GLUCERNA SHAKE)  237 mL Oral TID BM   insulin aspart  0-15 Units Subcutaneous TID WC   insulin aspart  0-5 Units Subcutaneous QHS   insulin aspart  2 Units Subcutaneous TID WC   insulin glargine-yfgn  10 Units Subcutaneous QHS   nystatin   Topical BID   rosuvastatin  40 mg Oral Daily   sodium chloride flush  3 mL Intravenous Q12H   tamsulosin  0.4 mg Oral QPC supper   Continuous Infusions:  sodium chloride       LOS: 7 days    Time spent: 35 minutes    Stephano Arrants Hoover Brunette, DO [facility]  If 7PM-7AM, please contact night-coverage [email or web address] 09/21/2022, 2:19 PM

## 2022-09-22 DIAGNOSIS — N179 Acute kidney failure, unspecified: Secondary | ICD-10-CM | POA: Diagnosis not present

## 2022-09-22 LAB — CBC
HCT: 28.5 % — ABNORMAL LOW (ref 39.0–52.0)
Hemoglobin: 9.2 g/dL — ABNORMAL LOW (ref 13.0–17.0)
MCH: 31 pg (ref 26.0–34.0)
MCHC: 32.3 g/dL (ref 30.0–36.0)
MCV: 96 fL (ref 80.0–100.0)
Platelets: 425 10*3/uL — ABNORMAL HIGH (ref 150–400)
RBC: 2.97 MIL/uL — ABNORMAL LOW (ref 4.22–5.81)
RDW: 13.2 % (ref 11.5–15.5)
WBC: 9.4 10*3/uL (ref 4.0–10.5)
nRBC: 0 % (ref 0.0–0.2)

## 2022-09-22 LAB — GLUCOSE, CAPILLARY
Glucose-Capillary: 111 mg/dL — ABNORMAL HIGH (ref 70–99)
Glucose-Capillary: 163 mg/dL — ABNORMAL HIGH (ref 70–99)
Glucose-Capillary: 262 mg/dL — ABNORMAL HIGH (ref 70–99)
Glucose-Capillary: 262 mg/dL — ABNORMAL HIGH (ref 70–99)

## 2022-09-22 NOTE — Progress Notes (Signed)
PT was bathed and ambulated to the chair. PT was also given a ensure. Chair alarm is in place.

## 2022-09-22 NOTE — Progress Notes (Signed)
PROGRESS NOTE    [name] [name]  ZOX:[id number] DOB: October 20, 1940 DOA: 09/13/2022 PCP: Donita Brooks, MD   Brief Narrative:    [name] [name] is a 82 y.o. male with medical history significant of hypertension, hyperlipidemia, T2DM, dementia, anxiety who presents to the emergency department due to concern with constipation.  Patient states that home health nurse tried to disimpact him by doing a rectal exam, he states that he has not had a bowel movement in 2 days.  Patient also of not being able to urinate within the last 2 days.  Apparently, patient has been having poor food intake, but has been drinking a lot of fluids.  Patient was reported to be more confused than baseline Patient was admitted from 8/15 to 8/23 due to right femoral neck fracture s/p ORIF by orthopedics.  He was admitted with AKI as well as bladder outlet obstruction and has required Foley catheter placement.  He has been seen by physical therapy with recommendations for short-term rehabilitation placement.  Currently waiting on appeal to determine whether or not he can go to facility.  Assessment & Plan:   Principal Problem:   Acute kidney injury (HCC) Active Problems:   Mixed hyperlipidemia   Dehydration   Bladder outlet obstruction   Hypoalbuminemia due to protein-calorie malnutrition (HCC)   Leukocytosis   Thrombocytosis   Anemia of chronic disease   Essential hypertension   Type 2 diabetes mellitus with hyperglycemia (HCC)   AKI (acute kidney injury) (HCC)  Assessment and Plan:  1-acute kidney injury -Appears to be prerenal azotemia from dehydration, decreased oral intake and also with postrenal component in the setting of bladder outlet obstruction -Renal function improved/resolved after fluid resuscitation and Foley catheter placement. -Will continue to follow trend, continue to maintain adequate hydration and continue minimizing the use of nephrotoxic agents as much as possible. -Renal function within  normal limits now.   2-bladder outlet obstruction -No prior history of BPH reported -Continue Foley catheter placement; outpatient follow-up with urology service recommended. -Low-dose Flomax has been started on so far with good tolerance.   3-anemia of chronic disease -Continue to follow hemoglobin trend -Current hemoglobin 10.6. -No signs of overt bleeding.   4-hypertension -Blood pressure has stabilized and rising -Low-dose Flomax will be started; so far well-tolerated. -Continue to follow vital signs and adjust antihypertensive regimen as needed.   5-mixed hyperlipidemia -Continue statin.   6-type 2 diabetes mellitus with hyperglycemia/hypoglycemia -Associated with insulin usage and decreased oral intake as part of hypoglycemia process. -Patient has been experiencing elevated blood sugars recently prior to admission -Continue to follow CBGs fluctuation and adjust regimen as needed -Continue sliding scale insulin.   7-physical deconditioning -Patient has been seen by physical therapy with recommendations for short-term rehabilitation at a skilled nursing facility. -TOC aware and will follow assistance for discharge plans. -Medically stable and ready for discharge to skilled nursing facility as recommended to PT for rehabilitation trial.  Family appealing as patient has been denied by insurance authorization.   8-advance care planning/goals of care discussion -Palliative care has been consulted; appreciate assistance and recommendation -At the moment patient is DNR/DNI. -Continue supportive care and provide treatment of acute conditions. -Skilled nursing facility recommended by PT for short-term rehab station.  Family in agreement. -Awaiting insurance authorization.   9-stage II pressure injury in his sacrum -Continue constant repositioning and preventive measures -Pressure injury present at time of admission without signs of superimposed infection.   10-leukocytosis -In  the setting of a  stress demargination/dehydration -Trending down adequately -No acute source of infection appreciated -Continue to maintain adequate hydration. -WBCs has stabilized and demonstrating no acute component of infection.   11-hypokalemia -Continue to follow electrolytes trend and further replete as needed.   12-fungal dermatitis -Continue use of topical nystatin to affected area. -Continue to keep area clean and dry.    DVT prophylaxis: Lovenox Code Status: Full Family Communication: Wife at bedside 9/10 Disposition Plan:  Status is: Inpatient Remains inpatient appropriate because: Anticipating SNF placement, but awaiting results of appeal.  Consultants:  None  Procedures:  None  Antimicrobials:  Anti-infectives (From admission, onward)    None      Subjective: Patient seen and evaluated today with no new acute complaints or concerns. No acute concerns or events noted overnight.  Objective: Vitals:   09/21/22 2124 09/22/22 0011 09/22/22 0422 09/22/22 1310  BP: 120/88 124/63 122/61 136/67  Pulse: 91 84 90 92  Resp:  18 16 17   Temp: 98.4 F (36.9 C) 98 F (36.7 C) 98.2 F (36.8 C) 98 F (36.7 C)  TempSrc: Oral  Oral Oral  SpO2: 95% 99% 97% 92%  Weight:      Height:        Intake/Output Summary (Last 24 hours) at 09/22/2022 1433 Last data filed at 09/22/2022 0830 Gross per 24 hour  Intake 200 ml  Output 3350 ml  Net -3150 ml   Filed Weights   09/13/22 1925 09/14/22 0053  Weight: 63 kg 63 kg    Examination:  General exam: Appears calm and comfortable  Respiratory system: Clear to auscultation. Respiratory effort normal. Cardiovascular system: S1 & S2 heard, RRR.  Gastrointestinal system: Abdomen is soft Central nervous system: Alert and awake Extremities: No edema Skin: No significant lesions noted Psychiatry: Flat affect. Foley catheter with clear, yellow urine output    Data Reviewed: I have personally reviewed following labs and  imaging studies  CBC: Recent Labs  Lab 09/19/22 0440 09/22/22 0411  WBC 11.9* 9.4  HGB 9.4* 9.2*  HCT 28.3* 28.5*  MCV 93.7 96.0  PLT 471* 425*   Basic Metabolic Panel: Recent Labs  Lab 09/21/22 0517  CREATININE 0.63   GFR: Estimated Creatinine Clearance: 64.5 mL/min (by C-G formula based on SCr of 0.63 mg/dL). Liver Function Tests: No results for input(s): "AST", "ALT", "ALKPHOS", "BILITOT", "PROT", "ALBUMIN" in the last 168 hours.  No results for input(s): "LIPASE", "AMYLASE" in the last 168 hours.  No results for input(s): "AMMONIA" in the last 168 hours. Coagulation Profile: No results for input(s): "INR", "PROTIME" in the last 168 hours. Cardiac Enzymes: No results for input(s): "CKTOTAL", "CKMB", "CKMBINDEX", "TROPONINI" in the last 168 hours. BNP (last 3 results) No results for input(s): "PROBNP" in the last 8760 hours. HbA1C: No results for input(s): "HGBA1C" in the last 72 hours. CBG: Recent Labs  Lab 09/21/22 1139 09/21/22 1634 09/21/22 2018 09/22/22 0733 09/22/22 1141  GLUCAP 243* 234* 290* 111* 163*   Lipid Profile: No results for input(s): "CHOL", "HDL", "LDLCALC", "TRIG", "CHOLHDL", "LDLDIRECT" in the last 72 hours. Thyroid Function Tests: No results for input(s): "TSH", "T4TOTAL", "FREET4", "T3FREE", "THYROIDAB" in the last 72 hours. Anemia Panel: No results for input(s): "VITAMINB12", "FOLATE", "FERRITIN", "TIBC", "IRON", "RETICCTPCT" in the last 72 hours. Sepsis Labs: No results for input(s): "PROCALCITON", "LATICACIDVEN" in the last 168 hours.  No results found for this or any previous visit (from the past 240 hour(s)).       Radiology Studies: DG Knee 1-2 Views Right  Result Date: 09/21/2022 CLINICAL DATA:  [id number] Knee pain, right [id number] EXAM: RIGHT KNEE - 1-2 VIEW COMPARISON:  08/31/2022 FINDINGS: No fracture or dislocation. No definite effusion. Osteopenia. Scattered arterial calcifications. IMPRESSION: No acute findings. Osteopenia.  Electronically Signed   By: Corlis Leak M.D.   On: 09/21/2022 14:12        Scheduled Meds:  Chlorhexidine Gluconate Cloth  6 each Topical Daily   enoxaparin (LOVENOX) injection  40 mg Subcutaneous Q24H   feeding supplement (GLUCERNA SHAKE)  237 mL Oral TID BM   insulin aspart  0-15 Units Subcutaneous TID WC   insulin aspart  0-5 Units Subcutaneous QHS   insulin aspart  2 Units Subcutaneous TID WC   insulin glargine-yfgn  10 Units Subcutaneous QHS   nystatin   Topical BID   rosuvastatin  40 mg Oral Daily   sodium chloride flush  3 mL Intravenous Q12H   tamsulosin  0.4 mg Oral QPC supper   Continuous Infusions:  sodium chloride       LOS: 8 days    Time spent: 35 minutes    Janel Beane Hoover Brunette, DO [facility]  If 7PM-7AM, please contact night-coverage [email or web address] 09/22/2022, 2:33 PM

## 2022-09-23 DIAGNOSIS — D72829 Elevated white blood cell count, unspecified: Secondary | ICD-10-CM | POA: Diagnosis not present

## 2022-09-23 DIAGNOSIS — N179 Acute kidney failure, unspecified: Secondary | ICD-10-CM | POA: Diagnosis not present

## 2022-09-23 DIAGNOSIS — N320 Bladder-neck obstruction: Secondary | ICD-10-CM | POA: Diagnosis not present

## 2022-09-23 DIAGNOSIS — E860 Dehydration: Secondary | ICD-10-CM | POA: Diagnosis not present

## 2022-09-23 LAB — GLUCOSE, CAPILLARY
Glucose-Capillary: 124 mg/dL — ABNORMAL HIGH (ref 70–99)
Glucose-Capillary: 272 mg/dL — ABNORMAL HIGH (ref 70–99)
Glucose-Capillary: 197 mg/dL — ABNORMAL HIGH (ref 70–99)
Glucose-Capillary: 167 mg/dL — ABNORMAL HIGH (ref 70–99)

## 2022-09-23 MED ORDER — CHLORHEXIDINE GLUCONATE CLOTH 2 % EX PADS
6.0000 | MEDICATED_PAD | Freq: Every day | CUTANEOUS | Status: DC
  Administered 2022-09-23 – 2022-09-25 (×3): 6 via TOPICAL

## 2022-09-23 NOTE — Progress Notes (Signed)
[name] NOTE   DELMAR BOTH  ZOX:[id number] DOB: [birth year] DOA: 09/13/2022 PCP: Donita Brooks, [name]   Chief Complaint  Patient presents with   Constipation   Level of care: Med-Surg  Brief Admission History:  82 y.o. male with medical history significant of [name] [name] [name] [name] [name] [name] [name] [name] who presents to the emergency department due to concern with constipation.  Patient states that home health nurse tried to disimpact him by doing a rectal exam, he states that he has not had a bowel movement in 2 days.  Patient also of not being able to urinate within the last 2 days.  Apparently, patient has been having poor food intake, but has been drinking a lot of fluids.  Patient was reported to be more confused than baseline Patient was admitted from 8/15 to 8/23 due to right femoral neck fracture s/p ORIF by orthopedics.  He was admitted with AKI as well as bladder outlet obstruction and has required Foley catheter placement.  He has been seen by physical therapy with recommendations for short-term rehabilitation placement.  Currently waiting on appeal to determine whether or not he can go to facility.   Assessment and Plan:  1-acute kidney injury -From dehydration, decreased oral intake and also with postrenal component in the setting of bladder outlet obstruction -Renal function improved/resolved after fluid resuscitation and Foley catheter placement. -Will continue to follow trend, continue to maintain adequate hydration and continue minimizing the use of nephrotoxic agents as much as possible. -Renal function within normal limits now.   2-bladder outlet obstruction -No prior history of BPH reported -he had successful urinary flow after Foley catheter placement and now with condom cath; outpatient follow-up with urology service recommended. -continue tamsulosin daily    3-anemia of chronic disease -Continue to follow hemoglobin trend -Current hemoglobin 10.6. -No  signs of overt bleeding.   4-Essential [name] -Blood pressure has stabilized and rising -Low-dose Flomax will be started; so far well-tolerated. -Continue to follow vital signs and adjust antihypertensive regimen as needed.   5-mixed [name] [name] -Continue statin.   6-type 2 diabetes mellitus with hyperglycemia/hypoglycemia -Associated with insulin usage and decreased oral intake as part of hypoglycemia process. -Patient has been experiencing elevated blood sugars recently prior to admission -Continue to follow CBGs fluctuation and adjust regimen as needed -Continue sliding scale insulin. CBG (last 3)  Recent Labs    09/22/22 1939 09/23/22 0722 09/23/22 1138  GLUCAP 262* 124* 272*    7-physical deconditioning -Patient has been seen by physical therapy with recommendations for short-term rehabilitation at a skilled nursing facility. -TOC aware and will follow assistance for discharge plans. -Medically stable and ready for discharge to skilled nursing facility as recommended to PT for rehabilitation trial.  Family appealing as patient has been denied by insurance authorization.   8-advance care planning/goals of care discussion -Palliative care has been consulted; appreciate assistance and recommendation -At the moment patient is DNR/DNI. -Continue supportive care and provide treatment of acute conditions. -Skilled nursing facility recommended by PT for short-term rehab station.  Family in agreement. -Awaiting insurance authorization.   9-stage II pressure injury in his sacrum -Continue constant repositioning and preventive measures -Pressure injury present at time of admission without signs of superimposed infection.   10-leukocytosis - resolved  -In the setting of a stress demargination/dehydration -Trending down adequately -No acute source of infection appreciated -Continue to maintain adequate hydration. -WBCs has stabilized and demonstrating no acute component  of infection.   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 -Continue to keep area clean and dry.  DVT prophylaxis: enoxaparin  Code Status: DNR Family Communication: wife bedside Disposition: TBD    Consultants:   Procedures:   Antimicrobials:    Subjective: Confused but no specific complaints today.   Objective: Vitals:   09/22/22 0422 09/22/22 1310 09/23/22 0441 09/23/22 0850  BP: 122/61 136/67 135/64 (!) 139/94  Pulse: 90 92 (!) 101 100  Resp: 16 17 18 19   Temp: 98.2 F (36.8 C) 98 F (36.7 C) 98.9 F (37.2 C) 98.2 F (36.8 C)  TempSrc: Oral Oral  Axillary  SpO2: 97% 92% 98% 95%  Weight:      Height:        Intake/Output Summary (Last 24 hours) at 09/23/2022 1317 Last data filed at 09/23/2022 0900 Gross per 24 hour  Intake 480 ml  Output 1225 ml  Net -745 ml   Filed Weights   09/13/22 1925 09/14/22 0053  Weight: 63 kg 63 kg   Examination:  General exam: frail elderly male, with confusion, Appears calm and comfortable  Respiratory system: Clear to auscultation. Respiratory effort normal. Cardiovascular system: normal S1 & S2 heard. No JVD, murmurs, rubs, gallops or clicks. No pedal edema. Gastrointestinal system: Abdomen is nondistended, soft and nontender. No organomegaly or masses felt. Normal bowel sounds heard. Central nervous system: Alert and oriented. No focal neurological deficits. Extremities: Symmetric 5 x 5 power. Skin: No rashes, lesions or ulcers. Psychiatry: Judgement and insight appear normal. Mood & affect appropriate.   Data Reviewed: I have personally reviewed following labs and imaging studies  CBC: Recent Labs  Lab 09/19/22 0440 09/22/22 0411  WBC 11.9* 9.4  HGB 9.4* 9.2*  HCT 28.3* 28.5*  MCV 93.7 96.0  PLT 471* 425*    Basic Metabolic Panel: Recent Labs  Lab 09/21/22 0517  CREATININE 0.63    CBG: Recent Labs  Lab 09/22/22 1141 09/22/22 1634  09/22/22 1939 09/23/22 0722 09/23/22 1138  GLUCAP 163* 262* 262* 124* 272*    No results found for this or any previous visit (from the past 240 hour(s)).   Radiology Studies: No results found.  Scheduled Meds:  enoxaparin (LOVENOX) injection  40 mg Subcutaneous Q24H   feeding supplement (GLUCERNA SHAKE)  237 mL Oral TID BM   insulin aspart  0-15 Units Subcutaneous TID WC   insulin aspart  0-5 Units Subcutaneous QHS   insulin aspart  2 Units Subcutaneous TID WC   insulin glargine-yfgn  10 Units Subcutaneous QHS   nystatin   Topical BID   rosuvastatin  40 mg Oral Daily   sodium chloride flush  3 mL Intravenous Q12H   tamsulosin  0.4 mg Oral QPC supper   Continuous Infusions:  sodium chloride      LOS: 9 days   Time spent: 44 mins  Konrad Hoak Laural Benes, [name] How to contact the [facility] Attending or Consulting provider 7A - 7P or covering provider during after hours 7P -7A, for this patient?  Check the care team in [facility] and look for a) attending/consulting TRH provider listed and b) the [facility] team listed Log into [email or web address] and use [place]'s universal password to access. If you do not have the password, please contact the hospital operator. Locate the [facility] provider you are looking for under [facility] and page to a number that you can be directly reached. If you still have difficulty reaching the provider, please page the [facility] (Director on Call) for the  Hospitalists listed on amion for assistance.  09/23/2022, 1:17 PM

## 2022-09-23 NOTE — Progress Notes (Addendum)
Nurse at bedside this am,patient's wife asking about removal of staples to right hip,stated that the patient had a appointment today to have this done,but missed the appointment due to being in the hospital. Dr Laural Benes requested information on which orthopedic specialist the patient see,wife unable to give information at that time. Mrs Roosvelt came back later and provided the needed information of the facility and MD patient was seeing. Information provided to Dr Laural Benes. Plan of care on going.

## 2022-09-23 NOTE — Progress Notes (Signed)
Alert to self and knows in hospital.  Was in chair earlier and was assisted to bed before wife left at 2000.  Sutures to right hip intact with no signs of infection.  Was irritable when asked to take medications and receive insulin, initially refusing but then agreed.  Bed alarm set and call bell in reach.

## 2022-09-23 NOTE — Plan of Care (Signed)
  Problem: Education: Goal: Verbalization of understanding the information provided (i.e., activity precautions, restrictions, etc) will improve Outcome: Not Progressing Goal: Individualized Educational Video(s) Outcome: Not Progressing   Problem: Activity: Goal: Ability to ambulate and perform ADLs will improve Outcome: Not Progressing  Patient is confused

## 2022-09-23 NOTE — Hospital Course (Signed)
82 y.o. male with medical history significant of hypertension, hyperlipidemia, T2DM, dementia, anxiety who presents to the emergency department due to concern with constipation.  Patient states that home health nurse tried to disimpact him by doing a rectal exam, he states that he has not had a bowel movement in 2 days.  Patient also of not being able to urinate within the last 2 days.  Apparently, patient has been having poor food intake, but has been drinking a lot of fluids.  Patient was reported to be more confused than baseline Patient was admitted from 8/15 to 8/23 due to right femoral neck fracture s/p ORIF by orthopedics.  He was admitted with AKI as well as bladder outlet obstruction and has required Foley catheter placement.  He has been seen by physical therapy with recommendations for short-term rehabilitation placement.  Currently waiting on appeal to determine whether or not he can go to facility.

## 2022-09-23 NOTE — TOC Progression Note (Signed)
Transition of Care [facility]) - Progression Note    Patient Details  Name: [name] [name] MRN: [id number] Date of Birth: 12/11/1940  Transition of Care [facility]) CM/SW Contact  Villa Herb, [place] Phone Number: 09/23/2022, 3:26 PM  Clinical Narrative:    At this time [facility] is awaiting final updates from second level appeal with [employer], no updates as of today. TOC to follow.   Expected Discharge Plan: Skilled Nursing Facility Barriers to Discharge: Insurance Authorization  Expected Discharge Plan and Services                                               Social Determinants of Health (SDOH) Interventions SDOH Screenings   Food Insecurity: Patient Unable To Answer (09/14/2022)  Housing: Patient Unable To Answer (09/14/2022)  Transportation Needs: Patient Unable To Answer (09/14/2022)  Utilities: Patient Unable To Answer (09/14/2022)  Alcohol Screen: Low Risk  (11/05/2021)  Depression (PHQ2-9): High Risk (11/05/2021)  Financial Resource Strain: Low Risk  (11/05/2021)  Physical Activity: Insufficiently Active (11/05/2021)  Social Connections: Moderately Integrated (11/05/2021)  Stress: No Stress Concern Present (11/05/2021)  Tobacco Use: Medium Risk (09/14/2022)    Readmission Risk Interventions     No data to display

## 2022-09-23 NOTE — Progress Notes (Signed)
Physical Therapy Treatment Patient Details Name: [name] [name] MRN: [id number] DOB: [birth year] Today's Date: 09/23/2022   History of Present Illness [name] [name] is a 82 y.o. male with medical history significant of hypertension, hyperlipidemia, T2DM, dementia, anxiety who presents to the emergency department due to concern with constipation.  Patient states that home health nurse tried to disimpact him by doing a rectal exam, he states that he has not had a bowel movement in 2 days.  Patient also of not being able to urinate within the last 2 days.  Apparently, patient has been having poor food intake, but has been drinking a lot of fluids.  Patient was reported to be more confused than baseline  Patient was admitted from 8/15 to 8/23 due to right femoral neck fracture s/p ORIF by orthopedics.    PT Comments  Patient requires repeated verbal/tactile cueing for participating with therapy with fair/poor carryover demonstrated due to easily agitated and confusion. Patient demonstrates slow labored movement for sitting up at bedside and once seated demonstrates fair/good return for maintaining sitting balance unsupported by BUE. Sit to stands attempted numerous times, but patient unable to follow directions and put back to bed with Max assist for repositioning.  Patient will benefit from continued skilled physical therapy in hospital and recommended venue below to increase strength, balance, endurance for safe ADLs and gait.      If plan is discharge home, recommend the following: A lot of help with walking and/or transfers;Help with stairs or ramp for entrance;A lot of help with bathing/dressing/bathroom;Assistance with cooking/housework   Can travel by private vehicle     No  Equipment Recommendations  None recommended by PT    Recommendations for Other Services       Precautions / Restrictions Precautions Precautions: Fall;Posterior Hip Restrictions Weight Bearing Restrictions: Yes RLE  Weight Bearing: Weight bearing as tolerated Other Position/Activity Restrictions: posterior hip precautions     Mobility  Bed Mobility Overal bed mobility: Needs Assistance Bed Mobility: Supine to Sit, Sit to Supine     Supine to sit: Min assist, Mod assist, HOB elevated Sit to supine: Max assist   General bed mobility comments: increased time, labored movement    Transfers                        Ambulation/Gait                   Stairs             Wheelchair Mobility     Tilt Bed    Modified Rankin (Stroke Patients Only)       Balance Overall balance assessment: Needs assistance Sitting-balance support: Feet supported, No upper extremity supported Sitting balance-Leahy Scale: Fair Sitting balance - Comments: fair/good seated at EOB                                    Cognition Arousal: Alert Behavior During Therapy: Anxious, Agitated Overall Cognitive Status: History of cognitive impairments - at baseline                                 General Comments: requires repeated verbal/tactile for participating with therapy        Exercises      General Comments        Pertinent  Vitals/Pain Pain Assessment Pain Assessment: Faces Faces Pain Scale: Hurts little more Pain Location: RLE with movement Pain Descriptors / Indicators: Grimacing, Guarding, Sore Pain Intervention(s): Limited activity within patient's tolerance, Monitored during session, Repositioned    Home Living                          Prior Function            PT Goals (current goals can now be found in the care plan section) Acute Rehab PT Goals Patient Stated Goal: return home with family to assist PT Goal Formulation: With patient/family Time For Goal Achievement: 09/29/22 Potential to Achieve Goals: Fair Progress towards PT goals: Progressing toward goals    Frequency    Min 3X/week      PT Plan       Co-evaluation              AM-PAC PT "6 Clicks" Mobility   Outcome Measure  Help needed turning from your back to your side while in a flat bed without using bedrails?: A Lot Help needed moving from lying on your back to sitting on the side of a flat bed without using bedrails?: A Lot Help needed moving to and from a bed to a chair (including a wheelchair)?: A Lot Help needed standing up from a chair using your arms (e.g., wheelchair or bedside chair)?: A Lot Help needed to walk in hospital room?: A Lot Help needed climbing 3-5 steps with a railing? : Total 6 Click Score: 11    End of Session   Activity Tolerance: Patient tolerated treatment well;Patient limited by fatigue;Treatment limited secondary to agitation Patient left: in bed;with family/visitor present Nurse Communication: Mobility status PT Visit Diagnosis: Unsteadiness on feet (R26.81);Muscle weakness (generalized) (M62.81);Other abnormalities of gait and mobility (R26.89);Pain Pain - Right/Left: Right Pain - part of body: Hip     Time: 4098-1191 PT Time Calculation (min) (ACUTE ONLY): 33 min  Charges:    $Therapeutic Activity: 8-22 mins PT General Charges $$ ACUTE PT VISIT: 1 Visit                     2:12 PM, 09/23/22 Ocie Bob, MPT Physical Therapist with [facility] [phone] office [phone] mobile phone

## 2022-09-23 NOTE — Progress Notes (Signed)
Patient oriented to self and place only. Patient offered breakfast this am ,he refused,patient stated " No I'm not hungry right now". Plan of care on going.

## 2022-09-24 DIAGNOSIS — N179 Acute kidney failure, unspecified: Secondary | ICD-10-CM | POA: Diagnosis not present

## 2022-09-24 LAB — BASIC METABOLIC PANEL
Anion gap: 9 (ref 5–15)
BUN: 9 mg/dL (ref 8–23)
CO2: 28 mmol/L (ref 22–32)
Calcium: 8.1 mg/dL — ABNORMAL LOW (ref 8.9–10.3)
Chloride: 99 mmol/L (ref 98–111)
Creatinine, Ser: 0.62 mg/dL (ref 0.61–1.24)
GFR, Estimated: >60 mL/min (ref >60)
Glucose, Bld: 72 mg/dL (ref 70–99)
Potassium: 2.9 mmol/L — ABNORMAL LOW (ref 3.5–5.1)
Sodium: 136 mmol/L (ref 135–145)

## 2022-09-24 LAB — MAGNESIUM: Magnesium: 1.8 mg/dL (ref 1.7–2.4)

## 2022-09-24 LAB — GLUCOSE, CAPILLARY
Glucose-Capillary: 75 mg/dL (ref 70–99)
Glucose-Capillary: 129 mg/dL — ABNORMAL HIGH (ref 70–99)
Glucose-Capillary: 175 mg/dL — ABNORMAL HIGH (ref 70–99)
Glucose-Capillary: 182 mg/dL — ABNORMAL HIGH (ref 70–99)

## 2022-09-24 MED ORDER — POTASSIUM CHLORIDE CRYS ER 20 MEQ PO TBCR
40.0000 meq | EXTENDED_RELEASE_TABLET | Freq: Once | ORAL | Status: AC
  Administered 2022-09-24: 40 meq via ORAL
  Filled 2022-09-24: qty 2

## 2022-09-24 MED ORDER — INSULIN GLARGINE-YFGN 100 UNIT/ML ~~LOC~~ SOLN
8.0000 [IU] | Freq: Every day | SUBCUTANEOUS | Status: DC
  Administered 2022-09-24: 8 [IU] via SUBCUTANEOUS
  Filled 2022-09-24 (×2): qty 0.08

## 2022-09-24 NOTE — Progress Notes (Addendum)
PROGRESS NOTE   [name] [name]  CWC:[id number] DOB: [birth year] DOA: 09/13/2022 PCP: Donita Brooks, MD   Chief Complaint  Patient presents with   Constipation   Level of care: Med-Surg  Brief Admission History:  82 y.o. male with medical history significant of hypertension, hyperlipidemia, T2DM, dementia, anxiety who presents to the emergency department due to concern with constipation.  Patient states that home health nurse tried to disimpact him by doing a rectal exam, he states that he has not had a bowel movement in 2 days.  Patient also of not being able to urinate within the last 2 days.  Apparently, patient has been having poor food intake, but has been drinking a lot of fluids.  Patient was reported to be more confused than baseline Patient was admitted from 8/15 to 8/23 due to right femoral neck fracture s/p ORIF by orthopedics.  He was admitted with AKI as well as bladder outlet obstruction and has required Foley catheter placement.  He has been seen by physical therapy with recommendations for short-term rehabilitation placement.  Currently waiting on appeal to determine whether or not he can go to facility.   Assessment and Plan:  1-acute kidney injury -From dehydration, decreased oral intake and also with postrenal component in the setting of bladder outlet obstruction -Renal function improved/resolved after fluid resuscitation and Foley catheter placement. -Will continue to follow trend, continue to maintain adequate hydration and continue minimizing the use of nephrotoxic agents as much as possible. -Renal function within normal limits now.   2-bladder outlet obstruction -No prior history of BPH reported -he had successful urinary flow after Foley catheter placement; outpatient follow-up with urology service recommended.  He will discharge with foley to follow up with urology for void trial.  -continue tamsulosin daily    3-anemia of chronic disease -Continue to follow  hemoglobin trend -Current hemoglobin 10.6. -No signs of overt bleeding.   4-Essential hypertension -Blood pressure has stabilized and rising -Low-dose Flomax will be started; so far well-tolerated. -Continue to follow vital signs and adjust antihypertensive regimen as needed.   5-mixed hyperlipidemia -Continue statin.   6-type 2 diabetes mellitus with hyperglycemia/hypoglycemia -Associated with insulin usage and decreased oral intake as part of hypoglycemia process. -Patient has been experiencing elevated blood sugars recently prior to admission -Continue to follow CBGs fluctuation and adjust regimen as needed -Continue sliding scale insulin. CBG (last 3)  Recent Labs    09/24/22 0738 09/24/22 1212 09/24/22 1610  GLUCAP 75 129* 175*    7-physical deconditioning -Patient has been seen by physical therapy with recommendations for short-term rehabilitation at a skilled nursing facility. -TOC aware and will follow assistance for discharge plans. -Medically stable and ready for discharge to skilled nursing facility as recommended to PT for rehabilitation trial.  Family appealing as patient has been denied by insurance authorization. -RN to do suture removal per family request, not able to go to orthopedic OV today due to being in hospital   8-advance care planning/goals of care discussion -Palliative care has been consulted; appreciate assistance and recommendation -At the moment patient is DNR/DNI. -Continue supportive care and provide treatment of acute conditions. -Skilled nursing facility recommended by PT for short-term rehab station.  Family in agreement. -Awaiting insurance authorization.   9-stage II pressure injury in his sacrum -Continue constant repositioning and preventive measures -Pressure injury present at time of admission without signs of superimposed infection.   10-leukocytosis - resolved  -In the setting of a stress demargination/dehydration -Trending down  adequately -No acute source of infection appreciated -Continue to maintain adequate hydration. -WBCs has stabilized and demonstrating no acute component of infection.   11-hypokalemia -repleted potassium    12-fungal dermatitis -Continue use of topical nystatin to affected area. -Continue to keep area clean and dry.  DVT prophylaxis: enoxaparin  Code Status: DNR Family Communication: wife bedside Disposition: TBD    Consultants:   Procedures:   Antimicrobials:    Subjective: No complaints.   Objective: Vitals:   09/23/22 1411 09/23/22 2006 09/24/22 0411 09/24/22 1251  BP: (!) 130/45 (!) 130/54 139/63 105/73  Pulse: 95 90 92 88  Resp: 16 18 18 18   Temp: 98.2 F (36.8 C) 98 F (36.7 C) 97.9 F (36.6 C) 98.4 F (36.9 C)  TempSrc:  Oral Oral Axillary  SpO2: 100% 98% 96% 98%  Weight:      Height:        Intake/Output Summary (Last 24 hours) at 09/24/2022 1708 Last data filed at 09/24/2022 1300 Gross per 24 hour  Intake 720 ml  Output 750 ml  Net -30 ml   Filed Weights   09/13/22 1925 09/14/22 0053  Weight: 63 kg 63 kg   Examination:  General exam: frail elderly male, with confusion, Appears calm and comfortable  Respiratory system: Clear to auscultation. Respiratory effort normal. Cardiovascular system: normal S1 & S2 heard. No JVD, murmurs, rubs, gallops or clicks. No pedal edema. Gastrointestinal system: Abdomen is nondistended, soft and nontender. No organomegaly or masses felt. Normal bowel sounds heard. Central nervous system: Alert and oriented. No focal neurological deficits. Extremities: Symmetric 5 x 5 power. Skin: No rashes, lesions or ulcers. Psychiatry: Judgement and insight appear normal. Mood & affect appropriate.   Data Reviewed: I have personally reviewed following labs and imaging studies  CBC: Recent Labs  Lab 09/19/22 0440 09/22/22 0411  WBC 11.9* 9.4  HGB 9.4* 9.2*  HCT 28.3* 28.5*  MCV 93.7 96.0  PLT 471* 425*    Basic  Metabolic Panel: Recent Labs  Lab 09/21/22 0517 09/24/22 0408  NA  --  136  K  --  2.9*  CL  --  99  CO2  --  28  GLUCOSE  --  72  BUN  --  9  CREATININE 0.63 0.62  CALCIUM  --  8.1*  MG  --  1.8    CBG: Recent Labs  Lab 09/23/22 1622 09/23/22 2106 09/24/22 0738 09/24/22 1212 09/24/22 1610  GLUCAP 197* 167* 75 129* 175*    No results found for this or any previous visit (from the past 240 hour(s)).   Radiology Studies: No results found.  Scheduled Meds:  Chlorhexidine Gluconate Cloth  6 each Topical Daily   enoxaparin (LOVENOX) injection  40 mg Subcutaneous Q24H   feeding supplement (GLUCERNA SHAKE)  237 mL Oral TID BM   insulin aspart  0-15 Units Subcutaneous TID WC   insulin aspart  0-5 Units Subcutaneous QHS   insulin aspart  2 Units Subcutaneous TID WC   insulin glargine-yfgn  8 Units Subcutaneous QHS   nystatin   Topical BID   rosuvastatin  40 mg Oral Daily   sodium chloride flush  3 mL Intravenous Q12H   tamsulosin  0.4 mg Oral QPC supper   Continuous Infusions:  sodium chloride      LOS: 10 days   Time spent: 37 mins  Denys Salinger Laural Benes, MD How to contact the [facility] Attending or Consulting provider 7A - 7P or covering provider during after hours  7P -7A, for this patient?  Check the care team in [facility] and look for a) attending/consulting TRH provider listed and b) the [facility] team listed Log into [email or web address] and use [place]'s universal password to access. If you do not have the password, please contact the hospital operator. Locate the [facility] provider you are looking for under [facility] and page to a number that you can be directly reached. If you still have difficulty reaching the provider, please page the [facility] (Director on Call) for the Hospitalists listed on amion for assistance.  09/24/2022, 5:08 PM

## 2022-09-24 NOTE — TOC Progression Note (Signed)
Transition of Care [facility]) - Progression Note    Patient Details  Name: [name] [name] MRN: [id number] Date of Birth: 08/13/1940  Transition of Care [facility]) CM/SW Contact  Elliot Gault, LCSW Phone Number: 09/24/2022, 11:14 AM  Clinical Narrative:     TOC following. Expecting appeal decision today. Attempted to contact [employer] to inquire on status and was redirected to their website. Unable to obtain status as no case number available. Called [facility] and they provided TOC with a different phone number for [employer]. Called this number with the same result.    Unable to determine status at this time. Will await contact from them with outcome.  Expected Discharge Plan: Skilled Nursing Facility Barriers to Discharge: Insurance Authorization  Expected Discharge Plan and Services                                               Social Determinants of Health (SDOH) Interventions SDOH Screenings   Food Insecurity: Patient Unable To Answer (09/14/2022)  Housing: Patient Unable To Answer (09/14/2022)  Transportation Needs: Patient Unable To Answer (09/14/2022)  Utilities: Patient Unable To Answer (09/14/2022)  Alcohol Screen: Low Risk  (11/05/2021)  Depression (PHQ2-9): High Risk (11/05/2021)  Financial Resource Strain: Low Risk  (11/05/2021)  Physical Activity: Insufficiently Active (11/05/2021)  Social Connections: Moderately Integrated (11/05/2021)  Stress: No Stress Concern Present (11/05/2021)  Tobacco Use: Medium Risk (09/14/2022)    Readmission Risk Interventions     No data to display

## 2022-09-25 DIAGNOSIS — N320 Bladder-neck obstruction: Secondary | ICD-10-CM | POA: Diagnosis not present

## 2022-09-25 DIAGNOSIS — E860 Dehydration: Secondary | ICD-10-CM | POA: Diagnosis not present

## 2022-09-25 DIAGNOSIS — N179 Acute kidney failure, unspecified: Secondary | ICD-10-CM | POA: Diagnosis not present

## 2022-09-25 DIAGNOSIS — D638 Anemia in other chronic diseases classified elsewhere: Secondary | ICD-10-CM | POA: Diagnosis not present

## 2022-09-25 LAB — CBC
HCT: 29 % — ABNORMAL LOW (ref 39.0–52.0)
Hemoglobin: 9.3 g/dL — ABNORMAL LOW (ref 13.0–17.0)
MCH: 31 pg (ref 26.0–34.0)
MCHC: 32.1 g/dL (ref 30.0–36.0)
MCV: 96.7 fL (ref 80.0–100.0)
Platelets: 347 10*3/uL (ref 150–400)
RBC: 3 MIL/uL — ABNORMAL LOW (ref 4.22–5.81)
RDW: 13.5 % (ref 11.5–15.5)
WBC: 8.1 10*3/uL (ref 4.0–10.5)
nRBC: 0 % (ref 0.0–0.2)

## 2022-09-25 LAB — GLUCOSE, CAPILLARY
Glucose-Capillary: 175 mg/dL — ABNORMAL HIGH (ref 70–99)
Glucose-Capillary: 207 mg/dL — ABNORMAL HIGH (ref 70–99)

## 2022-09-25 MED ORDER — LANTUS SOLOSTAR 100 UNIT/ML ~~LOC~~ SOPN: 8.0000 [IU] | PEN_INJECTOR | Freq: Every day | SUBCUTANEOUS | Status: DC

## 2022-09-25 MED ORDER — NYSTATIN 100000 UNIT/GM EX POWD: Freq: Two times a day (BID) | CUTANEOUS | 2 refills | Status: DC

## 2022-09-25 MED ORDER — TAMSULOSIN HCL 0.4 MG PO CAPS: 0.4000 mg | ORAL_CAPSULE | Freq: Every day | ORAL | 2 refills | Status: DC

## 2022-09-25 NOTE — Progress Notes (Signed)
Patient requires frequent re-positioning of the body in ways that cannot be achieved with an ordinary bed or wedge pillow, to eliminate pain, reduce pressure, and the head of the bed to be elevated more than 30 degrees most of the time due to Recent R hip fracture, ambulatory dysfunction, pressure ulcer to sacrum.

## 2022-09-25 NOTE — Progress Notes (Signed)
Patient stable. No acute events overnight.

## 2022-09-25 NOTE — Care Management Important Message (Signed)
Important Message  Patient Details  Name: [name] [name] MRN: [id number] Date of Birth: 01-07-1941   Medicare Important Message Given:  Yes     Corey Harold 09/25/2022, 10:56 AM

## 2022-09-25 NOTE — TOC Transition Note (Signed)
Transition of Care [facility]) - CM/SW Discharge Note   Patient Details  Name: [name] [name] MRN: [id number] Date of Birth: 08-14-1940  [facility] CM/SW Contact:  Elliot Gault, LCSW Phone Number: 09/25/2022, 10:41 AM   Clinical Narrative:     Pt medically stable for dc. Appeal for SNF was not successful. Updated pt's wife and she would like to take pt home with [facility], Hospital bed, and hoyer lift.   Pt's wife agreeable to referral to [employer]. Mitch at [employer] states they should be able to deliver DME later today.  HH arranged with [facility]. Info added to AVS.  Pt will need EMS transport home once DME in place. Updated RN.   No other TOC needs for dc.  Final next level of care: Home w Home Health Services Barriers to Discharge: Barriers Resolved   Patient Goals and CMS Choice CMS Medicare.gov Compare Post Acute Care list provided to:: Patient Represenative (must comment) Choice offered to / list presented to : Spouse  Discharge Placement                         Discharge Plan and Services Additional resources added to the After Visit Summary for   In-house Referral: Clinical Social Work   Post Acute Care Choice: Home Health          DME Arranged: Hospital bed, Other see comment [facility] lift) DME Agency: [facility] Date DME Agency Contacted: 09/25/22   Representative spoke with at DME Agency: Marthann Schiller HH Arranged: PT, OT HH Agency: [facility] Date [facility] Agency Contacted: 09/25/22   Representative spoke with at [facility] Agency: Kandee Keen  Social Determinants of Health (SDOH) Interventions SDOH Screenings   Food Insecurity: Patient Unable To Answer (09/14/2022)  Housing: Patient Unable To Answer (09/14/2022)  Transportation Needs: Patient Unable To Answer (09/14/2022)  Utilities: Patient Unable To Answer (09/14/2022)  Alcohol Screen: Low Risk  (11/05/2021)  Depression (PHQ2-9): High Risk (11/05/2021)  Financial Resource Strain: Low Risk  (11/05/2021)  Physical  Activity: Insufficiently Active (11/05/2021)  Social Connections: Moderately Integrated (11/05/2021)  Stress: No Stress Concern Present (11/05/2021)  Tobacco Use: Medium Risk (09/14/2022)     Readmission Risk Interventions     No data to display

## 2022-09-25 NOTE — Plan of Care (Signed)
Problem: Education: Goal: Verbalization of understanding the information provided (i.e., activity precautions, restrictions, etc) will improve Outcome: Progressing

## 2022-09-25 NOTE — Discharge Instructions (Signed)
IMPORTANT INFORMATION: PAY CLOSE ATTENTION   PHYSICIAN DISCHARGE INSTRUCTIONS  Follow with Primary care provider  Donita Brooks, MD  and other consultants as instructed by your Hospitalist Physician  SEEK MEDICAL CARE OR RETURN TO EMERGENCY ROOM IF SYMPTOMS COME BACK, WORSEN OR NEW PROBLEM DEVELOPS   Please note: You were cared for by a hospitalist during your hospital stay. Every effort will be made to forward records to your primary care provider.  You can request that your primary care provider send for your hospital records if they have not received them.  Once you are discharged, your primary care physician will handle any further medical issues. Please note that NO REFILLS for any discharge medications will be authorized once you are discharged, as it is imperative that you return to your primary care physician (or establish a relationship with a primary care physician if you do not have one) for your post hospital discharge needs so that they can reassess your need for medications and monitor your lab values.  Please get a complete blood count and chemistry panel checked by your Primary MD at your next visit, and again as instructed by your Primary MD.  Get Medicines reviewed and adjusted: Please take all your medications with you for your next visit with your Primary MD  Laboratory/radiological data: Please request your Primary MD to go over all hospital tests and procedure/radiological results at the follow up, please ask your primary care provider to get all Hospital records sent to his/her office.  In some cases, they will be blood work, cultures and biopsy results pending at the time of your discharge. Please request that your primary care provider follow up on these results.  If you are diabetic, please bring your blood sugar readings with you to your follow up appointment with primary care.    Please call and make your follow up appointments as soon as possible.    Also  Note the following: If you experience worsening of your admission symptoms, develop shortness of breath, life threatening emergency, suicidal or homicidal thoughts you must seek medical attention immediately by calling 911 or calling your MD immediately  if symptoms less severe.  You must read complete instructions/literature along with all the possible adverse reactions/side effects for all the Medicines you take and that have been prescribed to you. Take any new Medicines after you have completely understood and accpet all the possible adverse reactions/side effects.   Do not drive when taking Pain medications or sleeping medications (Benzodiazepines)  Do not take more than prescribed Pain, Sleep and Anxiety Medications. It is not advisable to combine anxiety,sleep and pain medications without talking with your primary care practitioner  Special Instructions: If you have smoked or chewed Tobacco  in the last 2 yrs please stop smoking, stop any regular Alcohol  and or any Recreational drug use.  Wear Seat belts while driving.  Do not drive if taking any narcotic, mind altering or controlled substances or recreational drugs or alcohol.

## 2022-09-25 NOTE — Progress Notes (Signed)
Patient refused insulin at 12pm. Wife at bedside attempted to talk him into taking it patient continued to refuse,Notified Dr. Laural Benes

## 2022-09-25 NOTE — Discharge Summary (Signed)
Physician Discharge Summary  [name] [name] WRU:[id number] DOB: May 13, 1940 DOA: 09/13/2022  PCP: Donita Brooks, MD Orthopedics: Dr. Carola Frost  Admit date: 09/13/2022 Discharge date: 09/25/2022  Admitted From:  Home with HH  Disposition: Home with [facility]   Recommendations for Outpatient Follow-up:  Follow up with PCP in 1 weeks Follow up with orthopedics in 1-2 weeks Sutures removed during this hospitalization Please follow up with Urology in 1-2 weeks for foley removal and voiding trial Please monitor blood sugar and titrate insulin doses as needed   Home Health:  OT/PT   Discharge Condition: STABLE   CODE STATUS: DNR DIET: Heart Healthy / Carb Modified    Brief Hospitalization Summary: Please see all hospital notes, images, labs for full details of the hospitalization. Admission Provider  HPI:  82 y.o. male with medical history significant of hypertension, hyperlipidemia, T2DM, dementia, anxiety who presents to the emergency department due to concern with constipation.  Patient states that home health nurse tried to disimpact him by doing a rectal exam, he states that he has not had a bowel movement in 2 days.  Patient also of not being able to urinate within the last 2 days.  Apparently, patient has been having poor food intake, but has been drinking a lot of fluids.  Patient was reported to be more confused than baseline.  Patient was admitted from 8/15 to 8/23 due to right femoral neck fracture s/p ORIF by orthopedics.  He was admitted with AKI as well as bladder outlet obstruction and has required Foley catheter placement.  He has been seen by physical therapy with recommendations for short-term rehabilitation placement.  Currently waiting on appeal to determine whether or not he can go to facility.  HOSPITAL COURSE BY PROBLEM LIST   1-acute kidney injury -From dehydration, decreased oral intake and also Postrenal in the setting of bladder outlet obstruction -Renal function  improved/resolved after fluid resuscitation and Foley catheter placement. -Will continue to follow trend, continue to maintain adequate hydration and continue minimizing the use of nephrotoxic agents as much as possible. -Renal function within normal limits now. -pt will DC home with foley catheter and follow up with urology outpatient in 1-2 weeks for foley removal and voiding trial after being on tamsulosin which was started recently    2-bladder outlet obstruction -No prior history of BPH reported -he had successful urinary flow after Foley catheter placement; outpatient follow-up with urology service recommended.  He will discharge with foley to follow up with urology for void trial.  -continue tamsulosin daily    3-anemia of chronic disease -Continue to follow hemoglobin trend -Current hemoglobin 10.6. - stable  -No signs of overt bleeding.   4-Essential hypertension -Blood pressure has stabilized and rising -continue tamsulosin daily -Continue to follow vital signs and adjust antihypertensive regimen outpatient as needed.   5-mixed hyperlipidemia -Continue statin.   6-type 2 diabetes mellitus with hyperglycemia/hypoglycemia -Associated with insulin usage and decreased oral intake -we have reduced glargine to 8 units daily with good results and no further hypoglycemia -Patient has been experiencing elevated blood sugars recently prior to admission  CBG (last 3)  Recent Labs (last 2 labs)       Recent Labs    09/24/22 0738 09/24/22 1212 09/24/22 1610  GLUCAP 75 129* 175*      7-physical deconditioning -Patient has been seen by physical therapy with recommendations for short-term rehabilitation at a skilled nursing facility. -TOC aware and will follow assistance for discharge plans. -Medically stable and ready  for discharge to skilled nursing facility as recommended to PT for rehabilitation trial.  Family appealing as patient has been denied by insurance  authorization. -RN completed suture removal on 09/24/22 per family request, not able to go to orthopedic OV due to being in hospital   8-advanced care planning/goals of care discussion -Palliative care has been consulted; appreciate assistance and recommendation -At the moment patient is DNR/DNI. -Continue supportive care and provide treatment of acute conditions. -Skilled nursing facility recommended by PT for short-term rehab station.  Family in agreement. -Awaiting insurance authorization - denied and family appeal denied, TOC reports pt must DC home with [facility] and equipment which was ordered (hospital bed, hoyer lift)   9-stage II pressure injury in his sacrum -Continue constant repositioning and preventive measures -Pressure injury present at time of admission without signs of superimposed infection.   10-leukocytosis - resolved  -In the setting of a stress demargination/dehydration -Trending down adequately -No acute source of infection appreciated -Continue to maintain adequate hydration. -WBCs has stabilized and demonstrating no acute component of infection.   11-hypokalemia -repleted potassium    12-fungal dermatitis -Continue use of topical nystatin to affected area and weekly fluconazole. -Continue to keep area clean and dry.    Discharge Diagnoses:  Principal Problem:   Acute kidney injury (HCC) Active Problems:   Mixed hyperlipidemia   Dehydration   Bladder outlet obstruction   Hypoalbuminemia due to protein-calorie malnutrition (HCC)   Leukocytosis   Thrombocytosis   Anemia of chronic disease   Essential hypertension   Type 2 diabetes mellitus with hyperglycemia (HCC)   AKI (acute kidney injury) [facility])   Discharge Instructions: Discharge Instructions     Ambulatory referral to Urology   Complete by: As directed    Hospital Follow up for urinary retention and needs voiding trial and foley removal in 1-2 weeks      Allergies as of 09/25/2022        Reactions   Invokana [canagliflozin] Palpitations, Rash, Other (See Comments)   Rash, tachycardia,weight loss   Plavix [clopidogrel Bisulfate]    Upset stomach        Medication List     STOP taking these medications    losartan 50 MG tablet Commonly known as: COZAAR   metFORMIN 500 MG tablet Commonly known as: GLUCOPHAGE       TAKE these medications    acetaminophen 325 MG tablet Commonly known as: TYLENOL Take 2 tablets (650 mg total) by mouth every 8 (eight) hours as needed for mild pain or moderate pain.   B-D ULTRAFINE III SHORT PEN 31G X 8 MM Misc Generic drug: Insulin Pen Needle USE TO INJECT INSULIN EVERY DAY   clotrimazole 1 % cream Commonly known as: Lotrimin AF Apply 1 Application topically 2 (two) times daily.   Dexcom G7 Sensor Misc Inject 1 Application into the skin as directed. Change sensor every 10 days as directed.   Dialyvite Vitamin D 5000 125 MCG (5000 UT) capsule Generic drug: Cholecalciferol Take 5,000 Units by mouth daily.   donepezil 10 MG tablet Commonly known as: ARICEPT TAKE 1 TABLET(10 MG) BY MOUTH AT BEDTIME What changed: See the new instructions.   Eliquis 2.5 MG Tabs tablet Generic drug: apixaban Take 1 tablet (2.5 mg total) by mouth 2 (two) times daily.   fluconazole 150 MG tablet Commonly known as: DIFLUCAN Take 1 tablet (150 mg total) by mouth once a week.   HYDROcodone-acetaminophen 5-325 MG tablet Commonly known as: Norco Take 1 tablet by  mouth every 6 (six) hours as needed for moderate pain. Ok to fill after 9/10 What changed: additional instructions   Lantus SoloStar 100 UNIT/ML Solostar Pen Generic drug: insulin glargine Inject 8 Units into the skin daily. What changed: how much to take   memantine 10 MG tablet Commonly known as: NAMENDA TAKE 1 TABLET(10 MG) BY MOUTH TWICE DAILY What changed: See the new instructions.   nystatin powder Commonly known as: MYCOSTATIN/NYSTOP Apply topically 2 (two) times  daily. Apply to groin area and scrotum   OneTouch Delica Plus Lancet33G Misc USE TO CHECK BLOOD SUGAR TWICE DAILY. Dx: E11.9.   OneTouch Verio test strip Generic drug: glucose blood USE TO TEST BLOOD SUGAR TWO TO THREE TIMES DAILY   OneTouch Verio w/Device Kit USE TO TEST BLOOD SUGAR LEVELS THREE TIMES DAILY   pioglitazone 45 MG tablet Commonly known as: ACTOS Take 1 tablet (45 mg total) by mouth daily.   rosuvastatin 40 MG tablet Commonly known as: CRESTOR TAKE 1 TABLET(40 MG) BY MOUTH DAILY What changed: See the new instructions.   tamsulosin 0.4 MG Caps capsule Commonly known as: FLOMAX Take 1 capsule (0.4 mg total) by mouth daily after supper.               Durable Medical Equipment  (From admission, onward)           Start     Ordered   09/25/22 0928  For home use only DME Hospital bed  Once       Question Answer Comment  Length of Need Lifetime   The above medical condition requires: Patient requires the ability to reposition frequently   Bed type Semi-electric   Hoyer Lift Yes      09/25/22 0927            Contact information for follow-up providers     [facility], [facility] Follow up.   Specialty: Home Health Services Why: [facility] staff will call you to schedule in home visits Contact information: [address] [place] [address] [phone]         [name], MD. Schedule an appointment as soon as possible for a visit in 1 week(s).   Specialty: Orthopedic Surgery Why: Hospital Follow Up Contact information: [address] [place] [address] [phone]         Donita Brooks, MD. Schedule an appointment as soon as possible for a visit in 1 week(s).   Specialty: Family Medicine Why: Hospital Follow Up Contact information: [address] [place] [address] [phone]         [facility] [place]. Schedule an appointment as soon as possible for a visit in 2 week(s).    Why: Hospital Follow Up for foley removal and voiding trial Contact information: [address] [place] [address] [phone]             Contact information for after-discharge care     Destination     [facility] Preferred SNF .   Service: Skilled Nursing Contact information: [address] [place] [address] [phone]                    Allergies  Allergen Reactions   Invokana [Canagliflozin] Palpitations, Rash and Other (See Comments)    Rash, tachycardia,weight loss    Plavix [Clopidogrel Bisulfate]     Upset stomach   Allergies as of 09/25/2022  Reactions   Invokana [canagliflozin] Palpitations, Rash, Other (See Comments)   Rash, tachycardia,weight loss   Plavix [clopidogrel Bisulfate]    Upset stomach        Medication List     STOP taking these medications    losartan 50 MG tablet Commonly known as: COZAAR   metFORMIN 500 MG tablet Commonly known as: GLUCOPHAGE       TAKE these medications    acetaminophen 325 MG tablet Commonly known as: TYLENOL Take 2 tablets (650 mg total) by mouth every 8 (eight) hours as needed for mild pain or moderate pain.   B-D ULTRAFINE III SHORT PEN 31G X 8 MM Misc Generic drug: Insulin Pen Needle USE TO INJECT INSULIN EVERY DAY   clotrimazole 1 % cream Commonly known as: Lotrimin AF Apply 1 Application topically 2 (two) times daily.   Dexcom G7 Sensor Misc Inject 1 Application into the skin as directed. Change sensor every 10 days as directed.   Dialyvite Vitamin D 5000 125 MCG (5000 UT) capsule Generic drug: Cholecalciferol Take 5,000 Units by mouth daily.   donepezil 10 MG tablet Commonly known as: ARICEPT TAKE 1 TABLET(10 MG) BY MOUTH AT BEDTIME What changed: See the new instructions.   Eliquis 2.5 MG Tabs tablet Generic drug: apixaban Take 1 tablet (2.5 mg total) by mouth 2 (two) times daily.   fluconazole  150 MG tablet Commonly known as: DIFLUCAN Take 1 tablet (150 mg total) by mouth once a week.   HYDROcodone-acetaminophen 5-325 MG tablet Commonly known as: Norco Take 1 tablet by mouth every 6 (six) hours as needed for moderate pain. Ok to fill after 9/10 What changed: additional instructions   Lantus SoloStar 100 UNIT/ML Solostar Pen Generic drug: insulin glargine Inject 8 Units into the skin daily. What changed: how much to take   memantine 10 MG tablet Commonly known as: NAMENDA TAKE 1 TABLET(10 MG) BY MOUTH TWICE DAILY What changed: See the new instructions.   nystatin powder Commonly known as: MYCOSTATIN/NYSTOP Apply topically 2 (two) times daily. Apply to groin area and scrotum   OneTouch Delica Plus Lancet33G Misc USE TO CHECK BLOOD SUGAR TWICE DAILY. Dx: E11.9.   OneTouch Verio test strip Generic drug: glucose blood USE TO TEST BLOOD SUGAR TWO TO THREE TIMES DAILY   OneTouch Verio w/Device Kit USE TO TEST BLOOD SUGAR LEVELS THREE TIMES DAILY   pioglitazone 45 MG tablet Commonly known as: ACTOS Take 1 tablet (45 mg total) by mouth daily.   rosuvastatin 40 MG tablet Commonly known as: CRESTOR TAKE 1 TABLET(40 MG) BY MOUTH DAILY What changed: See the new instructions.   tamsulosin 0.4 MG Caps capsule Commonly known as: FLOMAX Take 1 capsule (0.4 mg total) by mouth daily after supper.               Durable Medical Equipment  (From admission, onward)           Start     Ordered   09/25/22 0928  For home use only DME Hospital bed  Once       Question Answer Comment  Length of Need Lifetime   The above medical condition requires: Patient requires the ability to reposition frequently   Bed type Semi-electric   Hoyer Lift Yes      09/25/22 0927            Procedures/Studies: DG Knee 1-2 Views Right  Result Date: 09/21/2022 CLINICAL DATA:  [id number] Knee pain, right [id number] EXAM: RIGHT KNEE -  1-2 VIEW COMPARISON:  08/31/2022 FINDINGS: No  fracture or dislocation. No definite effusion. Osteopenia. Scattered arterial calcifications. IMPRESSION: No acute findings. Osteopenia. Electronically Signed   By: Corlis Leak M.D.   On: 09/21/2022 14:12   CT ABDOMEN PELVIS W CONTRAST  Result Date: 09/13/2022 CLINICAL DATA:  Increasing abdominal pain and distension EXAM: CT ABDOMEN AND PELVIS WITH CONTRAST TECHNIQUE: Multidetector CT imaging of the abdomen and pelvis was performed using the standard protocol following bolus administration of intravenous contrast. RADIATION DOSE REDUCTION: This exam was performed according to the departmental dose-optimization program which includes automated exposure control, adjustment of the mA and/or kV according to patient size and/or use of iterative reconstruction technique. CONTRAST:  80mL OMNIPAQUE IOHEXOL 300 MG/ML  SOLN COMPARISON:  None Available. FINDINGS: Lower chest: No acute pleural or parenchymal lung disease. Hepatobiliary: Calcified gallbladder wall consistent with porcelain gallbladder. No evidence of acute cholecystitis. The liver is unremarkable. Pancreas: Unremarkable. No pancreatic ductal dilatation or surrounding inflammatory changes. Spleen: Normal in size without focal abnormality. Adrenals/Urinary Tract: No acute renal abnormalities. No urinary tract calculi or obstructive uropathy. The adrenals are unremarkable. The bladder is decompressed with a Foley catheter, with bladder wall thickening and trabeculation suggesting chronic bladder outlet obstruction. Stomach/Bowel: No bowel obstruction or ileus. No bowel wall thickening or inflammatory change. Vascular/Lymphatic: Aortic atherosclerosis. No enlarged abdominal or pelvic lymph nodes. Reproductive: Prostate is unremarkable. Other: No free fluid or free intraperitoneal gas. No abdominal wall hernia. Musculoskeletal: No acute or destructive bony abnormalities. Postsurgical changes from recent right hip hemiarthroplasty, without evidence of complication.  Soft tissue swelling overlying the right hip consistent with recent surgical intervention. Reconstructed images demonstrate no additional findings. IMPRESSION: 1. Porcelain gallbladder.  No evidence of acute cholecystitis. 2. No bowel obstruction or ileus. 3. Bladder wall thickening with trabeculations, consistent with chronic bladder outlet obstruction. The bladder is decompressed with an indwelling Foley catheter. 4.  Aortic Atherosclerosis (ICD10-I70.0). 5. Postsurgical changes from recent right hip hemiarthroplasty. No evidence of acute complication. Electronically Signed   By: Sharlet Salina M.D.   On: 09/13/2022 23:10   CT Head Wo Contrast  Result Date: 09/13/2022 CLINICAL DATA:  Altered level of consciousness, abdominal pain EXAM: CT HEAD WITHOUT CONTRAST TECHNIQUE: Contiguous axial images were obtained from the base of the skull through the vertex without intravenous contrast. RADIATION DOSE REDUCTION: This exam was performed according to the departmental dose-optimization program which includes automated exposure control, adjustment of the mA and/or kV according to patient size and/or use of iterative reconstruction technique. COMPARISON:  04/11/2017 FINDINGS: Brain: Chronic ischemic changes are seen within the periventricular white matter and left basal ganglia. No evidence of acute infarct or hemorrhage. Lateral ventricles and remaining midline structures are unremarkable. No acute extra-axial fluid collections. No mass effect. Vascular: No hyperdense vessel or unexpected calcification. Skull: Normal. Negative for fracture or focal lesion. Sinuses/Orbits: No acute finding. Other: None. IMPRESSION: 1. Chronic ischemic changes as above. No acute intracranial process. Electronically Signed   By: Sharlet Salina M.D.   On: 09/13/2022 23:03   DG Chest Port 1 View  Result Date: 09/13/2022 CLINICAL DATA:  Mental status changes EXAM: PORTABLE CHEST 1 VIEW COMPARISON:  08/30/2022 FINDINGS: Heart and  mediastinal contours are within normal limits. No focal opacities or effusions. No acute bony abnormality. Aortic atherosclerosis. IMPRESSION: No active cardiopulmonary disease. Electronically Signed   By: Charlett Nose M.D.   On: 09/13/2022 22:20   DG Hip Port Unilat With Pelvis 1V Right  Result Date: 09/01/2022 CLINICAL  DATA:  Femoral neck fracture EXAM: DG HIP (WITH OR WITHOUT PELVIS) 1V PORT RIGHT COMPARISON:  Right hip x-ray 08/30/2022 FINDINGS: There is a new right hip arthroplasty in anatomic alignment. There is lateral hip soft tissue swelling and air compatible with recent surgery. No acute fractures are seen. There is a healed right inferior pubic ramus fracture. No dislocation. There are mild degenerative changes of the left hip. IMPRESSION: Right hip arthroplasty in anatomic alignment. Electronically Signed   By: Darliss Cheney M.D.   On: 09/01/2022 19:49   DG Knee Right Port  Result Date: 08/31/2022 CLINICAL DATA:  Femoral neck fracture., Recent fall. EXAM: PORTABLE RIGHT KNEE - 1-2 VIEW COMPARISON:  None Available. FINDINGS: No evidence of fracture, dislocation, or joint effusion. Minor degenerative change for age with peripheral spurring. No erosion or focal bone abnormality. Soft tissues are unremarkable. IMPRESSION: No fracture or subluxation of the right knee. Minor degenerative change. Electronically Signed   By: Narda Rutherford M.D.   On: 08/31/2022 15:08   DG Chest Port 1 View  Result Date: 08/30/2022 CLINICAL DATA:  Fall, hip fracture EXAM: PORTABLE CHEST 1 VIEW COMPARISON:  06/10/2011 FINDINGS: Heart and mediastinal contours within normal limits. Aortic atherosclerosis. Lungs clear. No effusions or pneumothorax. No acute bony abnormality. IMPRESSION: No active disease. Electronically Signed   By: Charlett Nose M.D.   On: 08/30/2022 19:14   DG Humerus Right  Result Date: 08/30/2022 CLINICAL DATA:  Fall, right arm pain EXAM: RIGHT HUMERUS - 2+ VIEW COMPARISON:  None Available.  FINDINGS: No acute bony abnormality. Specifically, no fracture, subluxation, or dislocation. Degenerative changes in the right AC joint. IMPRESSION: No acute bony abnormality. Electronically Signed   By: Charlett Nose M.D.   On: 08/30/2022 19:13   DG Elbow Complete Right  Result Date: 08/30/2022 CLINICAL DATA:  Fall, right elbow pain EXAM: RIGHT ELBOW - COMPLETE 3+ VIEW COMPARISON:  None Available. FINDINGS: Suboptimal visualization due to positioning. No visible fracture, subluxation or dislocation. No visible joint effusion. IMPRESSION: Suboptimal study due to positioning. No visible acute bony abnormality. Electronically Signed   By: Charlett Nose M.D.   On: 08/30/2022 19:13   DG Hip Unilat  With Pelvis 2-3 Views Right  Result Date: 08/30/2022 CLINICAL DATA:  Fall, right hip pain EXAM: DG HIP (WITH OR WITHOUT PELVIS) 2-3V RIGHT COMPARISON:  09/15/2020 FINDINGS: There is a right basicervical femoral neck fracture. Minimal displacement. No significant angulation. No subluxation or dislocation. Hip joints and SI joints are symmetric. Chronic appearing right inferior pubic ramus fracture. IMPRESSION: Right basicervical femoral neck fracture. Electronically Signed   By: Charlett Nose M.D.   On: 08/30/2022 19:12     Subjective: No complaints today.    Discharge Exam: Vitals:   09/24/22 2236 09/25/22 0532  BP: 126/60 138/66  Pulse: 90 87  Resp: 20 20  Temp: 98.8 F (37.1 C) 98.5 F (36.9 C)  SpO2: 98% 98%   Vitals:   09/24/22 0411 09/24/22 1251 09/24/22 2236 09/25/22 0532  BP: 139/63 105/73 126/60 138/66  Pulse: 92 88 90 87  Resp: 18 18 20 20   Temp: 97.9 F (36.6 C) 98.4 F (36.9 C) 98.8 F (37.1 C) 98.5 F (36.9 C)  TempSrc: Oral Axillary  Oral  SpO2: 96% 98% 98% 98%  Weight:      Height:       General exam: frail elderly male, with confusion, Appears calm and comfortable  Respiratory system: Clear to auscultation. Respiratory effort normal. Cardiovascular system: normal S1 &  S2  heard. No JVD, murmurs, rubs, gallops or clicks. No pedal edema. Gastrointestinal system: Abdomen is nondistended, soft and nontender. No organomegaly or masses felt. Normal bowel sounds heard. GU: foley cath flowing freely with amber urine.  Central nervous system: Alert and oriented. No focal neurological deficits. Extremities: Symmetric 5 x 5 power. Skin: No rashes, lesions or ulcers. Psychiatry: Judgement and insight appear normal. Mood & affect appropriate.    The results of significant diagnostics from this hospitalization (including imaging, microbiology, ancillary and laboratory) are listed below for reference.     Microbiology: No results found for this or any previous visit (from the past 240 hour(s)).   Labs: BNP (last 3 results) No results for input(s): "BNP" in the last 8760 hours. Basic Metabolic Panel: Recent Labs  Lab 09/21/22 0517 09/24/22 0408  NA  --  136  K  --  2.9*  CL  --  99  CO2  --  28  GLUCOSE  --  72  BUN  --  9  CREATININE 0.63 0.62  CALCIUM  --  8.1*  MG  --  1.8   Liver Function Tests: No results for input(s): "AST", "ALT", "ALKPHOS", "BILITOT", "PROT", "ALBUMIN" in the last 168 hours. No results for input(s): "LIPASE", "AMYLASE" in the last 168 hours. No results for input(s): "AMMONIA" in the last 168 hours. CBC: Recent Labs  Lab 09/19/22 0440 09/22/22 0411 09/25/22 0516  WBC 11.9* 9.4 8.1  HGB 9.4* 9.2* 9.3*  HCT 28.3* 28.5* 29.0*  MCV 93.7 96.0 96.7  PLT 471* 425* 347   Cardiac Enzymes: No results for input(s): "CKTOTAL", "CKMB", "CKMBINDEX", "TROPONINI" in the last 168 hours. BNP: Invalid input(s): "POCBNP" CBG: Recent Labs  Lab 09/24/22 1212 09/24/22 1610 09/24/22 2239 09/25/22 0724 09/25/22 1127  GLUCAP 129* 175* 182* 175* 207*   D-Dimer No results for input(s): "DDIMER" in the last 72 hours. Hgb A1c No results for input(s): "HGBA1C" in the last 72 hours. Lipid Profile No results for input(s): "CHOL", "HDL",  "LDLCALC", "TRIG", "CHOLHDL", "LDLDIRECT" in the last 72 hours. Thyroid function studies No results for input(s): "TSH", "T4TOTAL", "T3FREE", "THYROIDAB" in the last 72 hours.  Invalid input(s): "FREET3" Anemia work up No results for input(s): "VITAMINB12", "FOLATE", "FERRITIN", "TIBC", "IRON", "RETICCTPCT" in the last 72 hours. Urinalysis    Component Value Date/Time   COLORURINE YELLOW 09/13/2022 2228   APPEARANCEUR CLEAR 09/13/2022 2228   LABSPEC 1.009 09/13/2022 2228   PHURINE 6.0 09/13/2022 2228   GLUCOSEU >=500 (A) 09/13/2022 2228   HGBUR MODERATE (A) 09/13/2022 2228   BILIRUBINUR NEGATIVE 09/13/2022 2228   KETONESUR NEGATIVE 09/13/2022 2228   PROTEINUR 30 (A) 09/13/2022 2228   NITRITE NEGATIVE 09/13/2022 2228   LEUKOCYTESUR SMALL (A) 09/13/2022 2228   Sepsis Labs Recent Labs  Lab 09/19/22 0440 09/22/22 0411 09/25/22 0516  WBC 11.9* 9.4 8.1   Microbiology No results found for this or any previous visit (from the past 240 hour(s)).  Time coordinating discharge: 51 mins  SIGNED:  Standley Dakins, MD  [facility] 09/25/2022, 11:29 AM How to contact the [facility] Attending or Consulting provider 7A - 7P or covering provider during after hours 7P -7A, for this patient?  Check the care team in [facility] and look for a) attending/consulting TRH provider listed and b) the [facility] team listed Log into [email or web address] and use [place]'s universal password to access. If you do not have the password, please contact the hospital operator. Locate the [facility] provider you are looking for under  [facility] and page to a number that you can be directly reached. If you still have difficulty reaching the provider, please page the [facility] (Director on Call) for the Hospitalists listed on amion for assistance.

## 2022-09-28 ENCOUNTER — Telehealth: Payer: Self-pay

## 2022-09-28 NOTE — Transitions of Care (Post Inpatient/ED Visit) (Signed)
09/28/2022  Name: [name] [name] MRN: [id number] DOB: 1940-11-01  Today's TOC FU Call Status: Today's TOC FU Call Status:: Successful TOC FU Call Completed TOC FU Call Complete Date: 09/28/22 Patient's Name and Date of Birth confirmed.  Transition Care Management Follow-up Telephone Call Date of Discharge: 09/25/22 [facility] Type of Discharge: Inpatient Admission Primary Inpatient Discharge Diagnosis:: Acute Kidney Injury How have you been since you were released from the hospital?: Better (Per patient's spouse he is doing better) Any questions or concerns?: No  Items Reviewed: Did you receive and understand the discharge instructions provided?: Yes Medications obtained,verified, and reconciled?: Yes (Medications Reviewed) Any new allergies since your discharge?: No Dietary orders reviewed?: No Do you have support at home?: Yes People in Home: spouse Name of Support/Comfort Primary Source: Rene Kocher  Medications Reviewed Today: Medications Reviewed Today     Reviewed by Jodelle Gross, RN (Case Manager) on 09/28/22 at 1410  Med List Status: <None>   Medication Order Taking? Sig Documenting Provider Last Dose Status Informant  acetaminophen (TYLENOL) 325 MG tablet [id number] Yes Take 2 tablets (650 mg total) by mouth every 8 (eight) hours as needed for mild pain or moderate pain. Montez Morita, PA-C Taking Active Spouse/Significant Other, Pharmacy Records  apixaban [facility]) 2.5 MG TABS tablet [id number] Yes Take 1 tablet (2.5 mg total) by mouth 2 (two) times daily. Montez Morita, PA-C Taking Active Spouse/Significant Other, Pharmacy Records  B-D ULTRAFINE III SHORT PEN 31G X 8 MM MISC [id number] Yes USE TO INJECT INSULIN EVERY DAY Donita Brooks, MD Taking Active Spouse/Significant Other, Pharmacy Records  Blood Glucose Monitoring Suppl [facility] VERIO) w/Device Andria Rhein [id number] Yes USE TO TEST BLOOD SUGAR LEVELS THREE TIMES DAILY Dorena Bodo, PA-C Taking Active  Spouse/Significant Other, Pharmacy Records  Cholecalciferol (DIALYVITE VITAMIN D 5000) 125 MCG (5000 UT) capsule [id number] Yes Take 5,000 Units by mouth daily. [provider] Taking Active Spouse/Significant Other, Pharmacy Records  clotrimazole (LOTRIMIN AF) 1 % cream [id number] Yes Apply 1 Application topically 2 (two) times daily. Donita Brooks, MD Taking Active Spouse/Significant Other, Pharmacy Records  Continuous Glucose Sensor (DEXCOM G7 SENSOR) [place] [id number] Yes Inject 1 Application into the skin as directed. Change sensor every 10 days as directed. Dani Gobble, NP Taking Active Spouse/Significant Other, Pharmacy Records  donepezil (ARICEPT) 10 MG tablet [id number] Yes TAKE 1 TABLET(10 MG) BY MOUTH AT BEDTIME  Patient taking differently: Take 10 mg by mouth at bedtime.   Donita Brooks, MD Taking Active Spouse/Significant Other, Pharmacy Records  fluconazole (DIFLUCAN) 150 MG tablet [id number] Yes Take 1 tablet (150 mg total) by mouth once a week. Donita Brooks, MD Taking Active Spouse/Significant Other, Pharmacy Records  HYDROcodone-acetaminophen [facility]) 5-325 MG tablet [id number] Yes Take 1 tablet by mouth every 6 (six) hours as needed for moderate pain. Ok to fill after 9/10 Donita Brooks, MD Taking Active   insulin glargine (LANTUS SOLOSTAR) 100 UNIT/ML Solostar Pen [id number] Yes Inject 8 Units into the skin daily. [name], MD Taking Active   Lancets [facility] DELICA PLUS [place]) MISC [id number] Yes USE TO CHECK BLOOD SUGAR TWICE DAILY. Dx: E11.9. Donita Brooks, MD Taking Active Spouse/Significant Other, Pharmacy Records  memantine [facility]) 10 MG tablet [id number] Yes TAKE 1 TABLET(10 MG) BY MOUTH TWICE DAILY  Patient taking differently: Take 10 mg by mouth 2 (two) times daily.   Donita Brooks, MD Taking Active Spouse/Significant Other, Pharmacy Records  nystatin (MYCOSTATIN/NYSTOP) powder [id number] Yes Apply topically  2 (two) times daily.  Apply to groin area and scrotum [name], MD Taking Active   [facility] VERIO test strip [id number] Yes USE TO TEST BLOOD SUGAR TWO TO THREE TIMES DAILY Donita Brooks, MD Taking Active Spouse/Significant Other, Pharmacy Records  pioglitazone (ACTOS) 45 MG tablet [id number] Yes Take 1 tablet (45 mg total) by mouth daily. Dani Gobble, NP Taking Active Spouse/Significant Other, Pharmacy Records           Med Note Erin Fulling, [facility]   Mon Aug 31, 2022  9:22 AM)    rosuvastatin (CRESTOR) 40 MG tablet [id number] Yes TAKE 1 TABLET(40 MG) BY MOUTH DAILY  Patient taking differently: Take 40 mg by mouth daily.   Donita Brooks, MD Taking Active Spouse/Significant Other, Pharmacy Records  tamsulosin [facility]) 0.4 MG CAPS capsule [id number] Yes Take 1 capsule (0.4 mg total) by mouth daily after supper. [name], MD Taking Active             Home Care and Equipment/Supplies: Were Home Health Services Ordered?: Yes Name of Home Health Agency:: [facility] Has Agency set up a time to come to your home?: Yes First Home Health Visit Date: 09/29/22 Any new equipment or medical supplies ordered?: Yes Name of Medical supply agency?: [employer]- hospital bed, hoyer Were you able to get the equipment/medical supplies?: Yes Do you have any questions related to the use of the equipment/supplies?: No  Functional Questionnaire: Do you need assistance with bathing/showering or dressing?: Yes Do you need assistance with meal preparation?: Yes Do you need assistance with eating?: No Do you have difficulty maintaining continence: No Do you need assistance with getting out of bed/getting out of a chair/moving?: Yes Do you have difficulty managing or taking your medications?: Yes  Follow up appointments reviewed: PCP Follow-up appointment confirmed?: No (Patients spouse to make appointment) MD Provider Line [phone] Given: No Specialist Hospital Follow-up appointment confirmed?:  No Reason Specialist Follow-Up Not Confirmed: Patient has Specialist Provider Number and will Call for Appointment Do you need transportation to your follow-up appointment?: No Do you understand care options if your condition(s) worsen?: Yes-patient verbalized understanding   TOC Interventions Today    Flowsheet Row Most Recent Value  TOC Interventions   TOC Interventions Discussed/Reviewed TOC Interventions Discussed, TOC Interventions Reviewed, Contacted provider for patient needs  [Notified PCP about patients spouse waiting for a call from him on 09/15/22.]       Jodelle Gross RN, BSN, CCM [facility] RN Care Coordinator/ Transitions of Care Direct Dial: [phone]  Fax: [phone]

## 2022-10-02 DIAGNOSIS — I10 Essential (primary) hypertension: Secondary | ICD-10-CM | POA: Diagnosis not present

## 2022-10-02 DIAGNOSIS — Z89022 Acquired absence of left finger(s): Secondary | ICD-10-CM | POA: Diagnosis not present

## 2022-10-02 DIAGNOSIS — E119 Type 2 diabetes mellitus without complications: Secondary | ICD-10-CM | POA: Diagnosis not present

## 2022-10-02 DIAGNOSIS — D649 Anemia, unspecified: Secondary | ICD-10-CM | POA: Diagnosis not present

## 2022-10-02 DIAGNOSIS — B369 Superficial mycosis, unspecified: Secondary | ICD-10-CM | POA: Diagnosis not present

## 2022-10-02 DIAGNOSIS — E8809 Other disorders of plasma-protein metabolism, not elsewhere classified: Secondary | ICD-10-CM | POA: Diagnosis not present

## 2022-10-02 DIAGNOSIS — Z7901 Long term (current) use of anticoagulants: Secondary | ICD-10-CM | POA: Diagnosis not present

## 2022-10-02 DIAGNOSIS — I700 Atherosclerosis of aorta: Secondary | ICD-10-CM | POA: Diagnosis not present

## 2022-10-02 DIAGNOSIS — L89620 Pressure ulcer of left heel, unstageable: Secondary | ICD-10-CM | POA: Diagnosis not present

## 2022-10-02 DIAGNOSIS — Z466 Encounter for fitting and adjustment of urinary device: Secondary | ICD-10-CM | POA: Diagnosis not present

## 2022-10-02 DIAGNOSIS — E876 Hypokalemia: Secondary | ICD-10-CM | POA: Diagnosis not present

## 2022-10-02 DIAGNOSIS — E782 Mixed hyperlipidemia: Secondary | ICD-10-CM | POA: Diagnosis not present

## 2022-10-02 DIAGNOSIS — L89312 Pressure ulcer of right buttock, stage 2: Secondary | ICD-10-CM | POA: Diagnosis not present

## 2022-10-02 DIAGNOSIS — Z96641 Presence of right artificial hip joint: Secondary | ICD-10-CM | POA: Diagnosis not present

## 2022-10-02 DIAGNOSIS — L89322 Pressure ulcer of left buttock, stage 2: Secondary | ICD-10-CM | POA: Diagnosis not present

## 2022-10-02 DIAGNOSIS — Z87440 Personal history of urinary (tract) infections: Secondary | ICD-10-CM | POA: Diagnosis not present

## 2022-10-02 DIAGNOSIS — G8929 Other chronic pain: Secondary | ICD-10-CM | POA: Diagnosis not present

## 2022-10-02 DIAGNOSIS — M8588 Other specified disorders of bone density and structure, other site: Secondary | ICD-10-CM | POA: Diagnosis not present

## 2022-10-02 DIAGNOSIS — Z7984 Long term (current) use of oral hypoglycemic drugs: Secondary | ICD-10-CM | POA: Diagnosis not present

## 2022-10-02 DIAGNOSIS — L89320 Pressure ulcer of left buttock, unstageable: Secondary | ICD-10-CM | POA: Diagnosis not present

## 2022-10-02 DIAGNOSIS — Z860100 Personal history of colon polyps, unspecified: Secondary | ICD-10-CM | POA: Diagnosis not present

## 2022-10-02 DIAGNOSIS — L89610 Pressure ulcer of right heel, unstageable: Secondary | ICD-10-CM | POA: Diagnosis not present

## 2022-10-02 DIAGNOSIS — M150 Primary generalized (osteo)arthritis: Secondary | ICD-10-CM | POA: Diagnosis not present

## 2022-10-02 DIAGNOSIS — Z794 Long term (current) use of insulin: Secondary | ICD-10-CM | POA: Diagnosis not present

## 2022-10-02 DIAGNOSIS — Z85828 Personal history of other malignant neoplasm of skin: Secondary | ICD-10-CM | POA: Diagnosis not present

## 2022-10-02 DIAGNOSIS — N179 Acute kidney failure, unspecified: Secondary | ICD-10-CM | POA: Diagnosis not present

## 2022-10-02 DIAGNOSIS — K219 Gastro-esophageal reflux disease without esophagitis: Secondary | ICD-10-CM | POA: Diagnosis not present

## 2022-10-02 DIAGNOSIS — S72041D Displaced fracture of base of neck of right femur, subsequent encounter for closed fracture with routine healing: Secondary | ICD-10-CM | POA: Diagnosis not present

## 2022-10-02 DIAGNOSIS — F0394 Unspecified dementia, unspecified severity, with anxiety: Secondary | ICD-10-CM | POA: Diagnosis not present

## 2022-10-02 DIAGNOSIS — E46 Unspecified protein-calorie malnutrition: Secondary | ICD-10-CM | POA: Diagnosis not present

## 2022-10-02 DIAGNOSIS — N320 Bladder-neck obstruction: Secondary | ICD-10-CM | POA: Diagnosis not present

## 2022-10-07 ENCOUNTER — Telehealth: Payer: Self-pay | Admitting: Family Medicine

## 2022-10-07 NOTE — Telephone Encounter (Signed)
Received voicemail message from [place] with [facility].   Marylene Land stated the patient has two level 2 drug interactions: - between Aricept and Diflucan - between Diflucan and Hydrocodone.  Marylene Land requesting nursing eval for patient and requesting call back.   Please advise at [phone].

## 2022-10-09 ENCOUNTER — Telehealth: Payer: Self-pay | Admitting: Family Medicine

## 2022-10-09 ENCOUNTER — Encounter (HOSPITAL_COMMUNITY): Payer: Self-pay | Admitting: *Deleted

## 2022-10-09 ENCOUNTER — Emergency Department (HOSPITAL_COMMUNITY): Payer: PPO

## 2022-10-09 ENCOUNTER — Other Ambulatory Visit: Payer: Self-pay

## 2022-10-09 ENCOUNTER — Inpatient Hospital Stay (HOSPITAL_COMMUNITY)
Admission: EM | Admit: 2022-10-09 | Discharge: 2022-10-12 | DRG: 698 | Disposition: A | Discharge status: Home Health | Payer: PPO | Attending: Internal Medicine | Admitting: Internal Medicine

## 2022-10-09 DIAGNOSIS — I10 Essential (primary) hypertension: Secondary | ICD-10-CM | POA: Diagnosis not present

## 2022-10-09 DIAGNOSIS — E782 Mixed hyperlipidemia: Secondary | ICD-10-CM | POA: Diagnosis not present

## 2022-10-09 DIAGNOSIS — F0390 Unspecified dementia without behavioral disturbance: Secondary | ICD-10-CM | POA: Diagnosis not present

## 2022-10-09 DIAGNOSIS — E11649 Type 2 diabetes mellitus with hypoglycemia without coma: Secondary | ICD-10-CM | POA: Diagnosis not present

## 2022-10-09 DIAGNOSIS — Z87891 Personal history of nicotine dependence: Secondary | ICD-10-CM

## 2022-10-09 DIAGNOSIS — Z8249 Family history of ischemic heart disease and other diseases of the circulatory system: Secondary | ICD-10-CM | POA: Diagnosis not present

## 2022-10-09 DIAGNOSIS — D638 Anemia in other chronic diseases classified elsewhere: Secondary | ICD-10-CM | POA: Diagnosis present

## 2022-10-09 DIAGNOSIS — T83511A Infection and inflammatory reaction due to indwelling urethral catheter, initial encounter: Principal | ICD-10-CM | POA: Diagnosis present

## 2022-10-09 DIAGNOSIS — N3001 Acute cystitis with hematuria: Secondary | ICD-10-CM | POA: Diagnosis not present

## 2022-10-09 DIAGNOSIS — Y846 Urinary catheterization as the cause of abnormal reaction of the patient, or of later complication, without mention of misadventure at the time of the procedure: Secondary | ICD-10-CM | POA: Diagnosis present

## 2022-10-09 DIAGNOSIS — R627 Adult failure to thrive: Secondary | ICD-10-CM | POA: Diagnosis not present

## 2022-10-09 DIAGNOSIS — Z7401 Bed confinement status: Secondary | ICD-10-CM | POA: Diagnosis not present

## 2022-10-09 DIAGNOSIS — T83098A Other mechanical complication of other indwelling urethral catheter, initial encounter: Secondary | ICD-10-CM | POA: Diagnosis not present

## 2022-10-09 DIAGNOSIS — E1165 Type 2 diabetes mellitus with hyperglycemia: Secondary | ICD-10-CM | POA: Diagnosis present

## 2022-10-09 DIAGNOSIS — Z833 Family history of diabetes mellitus: Secondary | ICD-10-CM

## 2022-10-09 DIAGNOSIS — Z794 Long term (current) use of insulin: Secondary | ICD-10-CM | POA: Diagnosis not present

## 2022-10-09 DIAGNOSIS — R109 Unspecified abdominal pain: Secondary | ICD-10-CM | POA: Diagnosis not present

## 2022-10-09 DIAGNOSIS — R4182 Altered mental status, unspecified: Secondary | ICD-10-CM | POA: Diagnosis not present

## 2022-10-09 DIAGNOSIS — K219 Gastro-esophageal reflux disease without esophagitis: Secondary | ICD-10-CM | POA: Diagnosis present

## 2022-10-09 DIAGNOSIS — R739 Hyperglycemia, unspecified: Secondary | ICD-10-CM | POA: Diagnosis not present

## 2022-10-09 DIAGNOSIS — Z7901 Long term (current) use of anticoagulants: Secondary | ICD-10-CM | POA: Diagnosis not present

## 2022-10-09 DIAGNOSIS — F0394 Unspecified dementia, unspecified severity, with anxiety: Secondary | ICD-10-CM | POA: Diagnosis present

## 2022-10-09 DIAGNOSIS — N320 Bladder-neck obstruction: Secondary | ICD-10-CM | POA: Diagnosis not present

## 2022-10-09 DIAGNOSIS — Z1629 Resistance to other single specified antibiotic: Secondary | ICD-10-CM | POA: Diagnosis present

## 2022-10-09 DIAGNOSIS — Z66 Do not resuscitate: Secondary | ICD-10-CM | POA: Diagnosis present

## 2022-10-09 DIAGNOSIS — Z96641 Presence of right artificial hip joint: Secondary | ICD-10-CM | POA: Diagnosis not present

## 2022-10-09 DIAGNOSIS — Z89022 Acquired absence of left finger(s): Secondary | ICD-10-CM | POA: Diagnosis not present

## 2022-10-09 DIAGNOSIS — Z79899 Other long term (current) drug therapy: Secondary | ICD-10-CM

## 2022-10-09 DIAGNOSIS — I779 Disorder of arteries and arterioles, unspecified: Secondary | ICD-10-CM | POA: Diagnosis present

## 2022-10-09 DIAGNOSIS — Z83438 Family history of other disorder of lipoprotein metabolism and other lipidemia: Secondary | ICD-10-CM

## 2022-10-09 DIAGNOSIS — A419 Sepsis, unspecified organism: Secondary | ICD-10-CM | POA: Diagnosis not present

## 2022-10-09 DIAGNOSIS — Z888 Allergy status to other drugs, medicaments and biological substances status: Secondary | ICD-10-CM

## 2022-10-09 DIAGNOSIS — Z85828 Personal history of other malignant neoplasm of skin: Secondary | ICD-10-CM

## 2022-10-09 DIAGNOSIS — R531 Weakness: Secondary | ICD-10-CM | POA: Diagnosis not present

## 2022-10-09 DIAGNOSIS — R000 Tachycardia, unspecified: Secondary | ICD-10-CM | POA: Diagnosis not present

## 2022-10-09 DIAGNOSIS — E119 Type 2 diabetes mellitus without complications: Secondary | ICD-10-CM

## 2022-10-09 DIAGNOSIS — B964 Proteus (mirabilis) (morganii) as the cause of diseases classified elsewhere: Secondary | ICD-10-CM | POA: Diagnosis present

## 2022-10-09 DIAGNOSIS — R5381 Other malaise: Secondary | ICD-10-CM | POA: Diagnosis not present

## 2022-10-09 DIAGNOSIS — Z7984 Long term (current) use of oral hypoglycemic drugs: Secondary | ICD-10-CM | POA: Diagnosis not present

## 2022-10-09 LAB — COMPREHENSIVE METABOLIC PANEL
ALT: 16 U/L (ref 0–44)
AST: 17 U/L (ref 15–41)
Albumin: 3.1 g/dL — ABNORMAL LOW (ref 3.5–5.0)
Alkaline Phosphatase: 87 U/L (ref 38–126)
Anion gap: 13 (ref 5–15)
BUN: 24 mg/dL — ABNORMAL HIGH (ref 8–23)
CO2: 25 mmol/L (ref 22–32)
Calcium: 9.3 mg/dL (ref 8.9–10.3)
Chloride: 97 mmol/L — ABNORMAL LOW (ref 98–111)
Creatinine, Ser: 1.02 mg/dL (ref 0.61–1.24)
GFR, Estimated: >60 mL/min (ref >60)
Glucose, Bld: 391 mg/dL — ABNORMAL HIGH (ref 70–99)
Potassium: 4.4 mmol/L (ref 3.5–5.1)
Sodium: 135 mmol/L (ref 135–145)
Total Bilirubin: 0.6 mg/dL (ref 0.3–1.2)
Total Protein: 6.8 g/dL (ref 6.5–8.1)

## 2022-10-09 LAB — CBC WITH DIFFERENTIAL/PLATELET
Abs Immature Granulocytes: 0.01 10*3/uL (ref 0.00–0.07)
Basophils Absolute: 0.1 10*3/uL (ref 0.0–0.1)
Basophils Relative: 1 %
Eosinophils Absolute: 0.1 10*3/uL (ref 0.0–0.5)
Eosinophils Relative: 1 %
HCT: 43.2 % (ref 39.0–52.0)
Hemoglobin: 13.9 g/dL (ref 13.0–17.0)
Immature Granulocytes: 0 %
Lymphocytes Relative: 15 %
Lymphs Abs: 1 10*3/uL (ref 0.7–4.0)
MCH: 30.6 pg (ref 26.0–34.0)
MCHC: 32.2 g/dL (ref 30.0–36.0)
MCV: 95.2 fL (ref 80.0–100.0)
Monocytes Absolute: 0.6 10*3/uL (ref 0.1–1.0)
Monocytes Relative: 9 %
Neutro Abs: 4.9 10*3/uL (ref 1.7–7.7)
Neutrophils Relative %: 74 %
Platelets: 402 10*3/uL — ABNORMAL HIGH (ref 150–400)
RBC: 4.54 MIL/uL (ref 4.22–5.81)
RDW: 14.4 % (ref 11.5–15.5)
WBC: 6.7 10*3/uL (ref 4.0–10.5)
nRBC: 0 % (ref 0.0–0.2)

## 2022-10-09 LAB — URINALYSIS, ROUTINE W REFLEX MICROSCOPIC
Bilirubin Urine: NEGATIVE
Glucose, UA: >=500 mg/dL — AB
Ketones, ur: 5 mg/dL — AB
Nitrite: NEGATIVE
Protein, ur: 100 mg/dL — AB
RBC / HPF: >50 RBC/hpf (ref 0–5)
Specific Gravity, Urine: 1.027 (ref 1.005–1.030)
WBC, UA: >50 WBC/hpf (ref 0–5)
pH: 6 (ref 5.0–8.0)

## 2022-10-09 LAB — PROTIME-INR
INR: 1.3 — ABNORMAL HIGH (ref 0.8–1.2)
Prothrombin Time: 16.3 s — ABNORMAL HIGH (ref 11.4–15.2)

## 2022-10-09 LAB — GLUCOSE, CAPILLARY
Glucose-Capillary: 287 mg/dL — ABNORMAL HIGH (ref 70–99)
Glucose-Capillary: 282 mg/dL — ABNORMAL HIGH (ref 70–99)

## 2022-10-09 LAB — LACTIC ACID, PLASMA
Lactic Acid, Venous: 3.9 mmol/L (ref 0.5–1.9)
Lactic Acid, Venous: 2.9 mmol/L (ref 0.5–1.9)

## 2022-10-09 LAB — APTT: aPTT: 30 s (ref 24–36)

## 2022-10-09 LAB — LIPASE, BLOOD: Lipase: 20 U/L (ref 11–51)

## 2022-10-09 MED ORDER — HYDRALAZINE HCL 20 MG/ML IJ SOLN: 5.0000 mg | INTRAMUSCULAR | Status: DC | PRN

## 2022-10-09 MED ORDER — MEMANTINE HCL 10 MG PO TABS
10.0000 mg | ORAL_TABLET | Freq: Two times a day (BID) | ORAL | Status: DC
  Administered 2022-10-10 – 2022-10-12 (×5): 10 mg via ORAL
  Filled 2022-10-09 (×5): qty 1

## 2022-10-09 MED ORDER — ACETAMINOPHEN 650 MG RE SUPP: 650.0000 mg | Freq: Four times a day (QID) | RECTAL | Status: DC | PRN

## 2022-10-09 MED ORDER — INSULIN ASPART 100 UNIT/ML IJ SOLN
0.0000 [IU] | Freq: Three times a day (TID) | INTRAMUSCULAR | Status: DC
  Administered 2022-10-10: 8 [IU] via SUBCUTANEOUS
  Administered 2022-10-10 – 2022-10-11 (×4): 3 [IU] via SUBCUTANEOUS
  Administered 2022-10-12: 5 [IU] via SUBCUTANEOUS
  Administered 2022-10-12: 2 [IU] via SUBCUTANEOUS

## 2022-10-09 MED ORDER — LACTATED RINGERS IV BOLUS (SEPSIS)
1000.0000 mL | Freq: Once | INTRAVENOUS | Status: AC
  Administered 2022-10-09: 1000 mL via INTRAVENOUS

## 2022-10-09 MED ORDER — APIXABAN 2.5 MG PO TABS
2.5000 mg | ORAL_TABLET | Freq: Two times a day (BID) | ORAL | Status: DC
  Administered 2022-10-10 – 2022-10-12 (×5): 2.5 mg via ORAL
  Filled 2022-10-09 (×5): qty 1

## 2022-10-09 MED ORDER — INSULIN GLARGINE-YFGN 100 UNIT/ML ~~LOC~~ SOLN
8.0000 [IU] | Freq: Every day | SUBCUTANEOUS | Status: DC
  Administered 2022-10-10 – 2022-10-12 (×3): 8 [IU] via SUBCUTANEOUS
  Filled 2022-10-09 (×7): qty 0.08

## 2022-10-09 MED ORDER — LACTATED RINGERS IV SOLN: INTRAVENOUS | Status: DC

## 2022-10-09 MED ORDER — IOHEXOL 300 MG/ML  SOLN
100.0000 mL | Freq: Once | INTRAMUSCULAR | Status: AC | PRN
  Administered 2022-10-09: 100 mL via INTRAVENOUS

## 2022-10-09 MED ORDER — INSULIN ASPART 100 UNIT/ML IJ SOLN
0.0000 [IU] | Freq: Every day | INTRAMUSCULAR | Status: DC
  Administered 2022-10-09: 3 [IU] via SUBCUTANEOUS
  Administered 2022-10-10: 2 [IU] via SUBCUTANEOUS
  Administered 2022-10-11: 4 [IU] via SUBCUTANEOUS

## 2022-10-09 MED ORDER — POLYETHYLENE GLYCOL 3350 17 G PO PACK: 17.0000 g | PACK | Freq: Every day | ORAL | Status: DC | PRN

## 2022-10-09 MED ORDER — ACETAMINOPHEN 325 MG PO TABS
650.0000 mg | ORAL_TABLET | Freq: Four times a day (QID) | ORAL | Status: DC | PRN
  Administered 2022-10-11: 650 mg via ORAL
  Filled 2022-10-09: qty 2

## 2022-10-09 MED ORDER — LACTATED RINGERS IV SOLN: INTRAVENOUS | Status: AC

## 2022-10-09 MED ORDER — SODIUM CHLORIDE 0.9 % IV SOLN
2.0000 g | INTRAVENOUS | Status: DC
  Administered 2022-10-10 – 2022-10-12 (×3): 2 g via INTRAVENOUS
  Filled 2022-10-09 (×3): qty 20

## 2022-10-09 MED ORDER — SODIUM CHLORIDE 0.9 % IV SOLN: 2.0000 g | Freq: Two times a day (BID) | INTRAVENOUS | Status: DC

## 2022-10-09 MED ORDER — SODIUM CHLORIDE 0.9 % IV SOLN
2.0000 g | Freq: Once | INTRAVENOUS | Status: AC
  Administered 2022-10-09: 2 g via INTRAVENOUS
  Filled 2022-10-09: qty 12.5

## 2022-10-09 MED ORDER — VITAMIN D 25 MCG (1000 UNIT) PO TABS
5000.0000 [IU] | ORAL_TABLET | Freq: Every day | ORAL | Status: DC
  Administered 2022-10-10 – 2022-10-12 (×3): 5000 [IU] via ORAL
  Filled 2022-10-09 (×6): qty 5

## 2022-10-09 MED ORDER — TAMSULOSIN HCL 0.4 MG PO CAPS
0.4000 mg | ORAL_CAPSULE | Freq: Every day | ORAL | Status: DC
  Administered 2022-10-10 – 2022-10-11 (×2): 0.4 mg via ORAL
  Filled 2022-10-09 (×2): qty 1

## 2022-10-09 MED ORDER — ROSUVASTATIN CALCIUM 20 MG PO TABS
40.0000 mg | ORAL_TABLET | Freq: Every day | ORAL | Status: DC
  Administered 2022-10-10 – 2022-10-12 (×3): 40 mg via ORAL
  Filled 2022-10-09 (×3): qty 2

## 2022-10-09 MED ORDER — DONEPEZIL HCL 5 MG PO TABS
10.0000 mg | ORAL_TABLET | Freq: Every day | ORAL | Status: DC
  Administered 2022-10-10 – 2022-10-11 (×2): 10 mg via ORAL
  Filled 2022-10-09 (×2): qty 2

## 2022-10-09 NOTE — Sepsis Progress Note (Signed)
eLink is following this Code Sepsis. °

## 2022-10-09 NOTE — Consult Note (Signed)
Pharmacy Antibiotic Note  ASSESSMENT: 82 y.o. male with PMH chronic Foley, HTN, HLD, T2DM, MDD/GAD, dementia is presenting with concerns for UTI due to AMS. Patient is tachycardic and tachypneic, but without leukocytosis. Patient in CT currently for abdominal and pelvic scan. Pharmacy has been consulted to manage cefepime dosing.  Recent admission earlier this month for AKI in setting of dehydration, decreased intake, and bladder outlet obstruction. Also, admission from 8/15-8/23 due to right femoral neck fracture s/p ORIF.  Patient measurements: Height: 5\' 9"  (175.3 cm) Weight: 63 kg (138 lb 14.2 oz) IBW/kg (Calculated) : 70.7  Vital signs: Temp: 98.3 F (36.8 C) (09/27 1415) Temp Source: Oral (09/27 1415) BP: 151/67 (09/27 1415) Pulse Rate: 114 (09/27 1415) Recent Labs  Lab 10/09/22 1416  WBC 6.7  CREATININE 1.02   Estimated Creatinine Clearance: 50.6 mL/min (by C-G formula based on SCr of 1.02 mg/dL).  Allergies: Allergies  Allergen Reactions   Invokana [Canagliflozin] Palpitations, Rash and Other (See Comments)    Rash, tachycardia,weight loss    Plavix [Clopidogrel Bisulfate]     Upset stomach    Antimicrobials this admission: Cefepime 9/27 >>   Dose adjustments this admission: N/A  Microbiology results: 9/27 BCx: sent 9/27 Covid/Flu/RSV: sent   PLAN: Initiate cefepime 2 g IV q12H Follow up culture results to assess for antibiotic optimization. Monitor renal function to assess for any necessary antibiotic dosing changes.   Thank you for allowing pharmacy to be a part of this patient's care.  Will M. Dareen Piano, PharmD Clinical Pharmacist 10/09/2022 2:57 PM
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
## 2022-10-09 NOTE — ED Triage Notes (Signed)
Pt BIB [facility] for c/o AMS per family; pt cbg with ems in 300's; pt has indwelling urinary catheter

## 2022-10-09 NOTE — ED Notes (Signed)
Lactic acid 3.9 Informed MD

## 2022-10-09 NOTE — Progress Notes (Signed)
   10/09/22 1908  Assess: MEWS Score  Temp 98.2 F (36.8 C)  BP (!) 142/62  Pulse Rate (!) 116  SpO2 99 %  O2 Device Room Air  Assess: MEWS Score  MEWS Temp 0  MEWS Systolic 0  MEWS Pulse 2  MEWS RR 1  MEWS LOC 0  MEWS Score 3  MEWS Score Color Yellow  Assess: if the MEWS score is Yellow or Red  Were vital signs accurate and taken at a resting state? Yes  Does the patient meet 2 or more of the SIRS criteria? Yes  Does the patient have a confirmed or suspected source of infection? Yes  Notify: Charge Nurse/RN  Name of Charge Nurse/RN Notified Roque Lias, RN  Provider Notification  Provider Name/Title Dr. Lisette Abu (MD notified by dayshift nurse)  Assess: SIRS CRITERIA  SIRS Temperature  0  SIRS Pulse 1  SIRS Respirations  1  SIRS WBC 0  SIRS Score Sum  2

## 2022-10-09 NOTE — Telephone Encounter (Addendum)
Received call from Teressa Senter with [facility] to report the following:  - patient has elevated bp, hr, blood sugar - poss UTI  Loraine Leriche stated he's going to advise the patient and family he needs to go to the hospital to be seen; stated the patient looks sick.   Requesting call back.  Please advise at [phone].

## 2022-10-09 NOTE — Sepsis Progress Note (Signed)
Notified bedside nurse of need to draw repeat lactic acid. However, it was noted that there was no repeat LA blood draw ordered. Notified provider of need to order repeat lactic acid.

## 2022-10-09 NOTE — Op Note (Signed)
09/01/2022  10:16 PM  PATIENT:  [name] [name]  [birth year] male   MEDICAL RECORD NUMBER: [id number]  PRE-OPERATIVE DIAGNOSIS:  DISPLACED RIGHT FEMORAL NECK FRACTURE  POST-OPERATIVE DIAGNOSIS:  DISPLACED RIGHT FEMORAL NECK FRACTURE  PROCEDURE:  Procedure(s): UNIPOLAR HEMIARTHROPLASTY OF THE RIGHT HIP with [place]uy [place] Basic #6 femoral stem, standard neck, 49 mm head  SURGEON:  Surgeon(s) and Role:    [name], MD - Primary  PHYSICIAN ASSISTANT: Montez Morita, PA-C  ANESTHESIA:   general  EBL:  200 mL   BLOOD ADMINISTERED:none  DRAINS: none   LOCAL MEDICATIONS USED:  NONE  SPECIMEN:  No Specimen  DISPOSITION OF SPECIMEN:  N/A  COUNTS:  YES  TOURNIQUET:  * No tourniquets in log *  DICTATION: Note written in EPIC  PLAN OF CARE: Admit to inpatient   PATIENT DISPOSITION:  PACU - hemodynamically stable.   Delay start of Pharmacological VTE agent (>24hrs) due to surgical blood loss or risk of bleeding: no  BRIEF SUMMARY OF INDICATION FOR PROCEDURE:  [name] [name] is a very pleasant 82 y.o. with dementia, who sustained an unwitnessed fall producing inability to bear weight, shortening, and external rotation of the extremity.  She was seen and evaluated with the recommendation for hemiarthroplasty. I discussed with the patient and family the risks and benefits, inclding the potential for leg length inequality, dislocation or instability, arthritis, loss of motion, DVT, PE, heart attack, stroke, and death.  Consent was given to proceed.  BRIEF SUMMARY OF PROCEDURE:  The patient was taken to the operating room where general anesthesia was induced and after administration of preoperative antibiotics consisting of 2 g of Ancef.  He was positioned with the right side up and all prominences were padded appropriately.  We made a 10 cm incision after the time-out, carrying dissection down to the IT band, was split in line with the skin.  Cerebellar retractor was placed and  we were able to then flex and internally rotate the hip releasing the piriformis and short rotators at their insertions.  The capsule was then T'd, tagging the corners with #1 Vicryl.  The neck cut was refined using a cutting guide and then this was followed by removal of the head, which sized perfectly to 49 mm. Acetabular trials were placed, confirming this size as the best fit. Mueller and Cobra retractors were placed along the proximal femur, which was then prepared with the canal finder,  then lateralizer, followed by reamers up to 6, and the broaches, achieving  outstanding fit and fill with the #6 broach.  The calcar reamer was used to refine the cut as we were using a low demand stem.  The canal was irrigated thoroughly and the acetabulum once again searched multiple times for fragments and irrigated thoroughly.  Trial components were placed and the patient had outstanding stability in combine 90 degrees of flexion, adduction, and internal rotation as well as in external rotation and extension.  Consequently, actual components were placed.  My assistant Montez Morita, was necessary for delivery and control of the proximal femur during preparation, also during relocation and dislocation of the trial components as well as relocation of the actual components.  He assisted me with wound closure as well.  I did repair the capsule with #1 Vicryl and then used #2 FiberWire through bone tunnels to repair the short rotators and piriformis.  This was followed by a #1 Vicryl for the IT band and lastly 2-0 Vicryl and nylon for the subcutaneous  and skin.  Sterile gently compressive dressing was applied.  The patient was awakened from anesthesia and transported to the PACU in stable condition.  PROGNOSIS:  The patient will be weightbearing as tolerated with posterior hip precautions.  Patient has an elevated risk of complications related to declining overall health and mobility.  [name] [name]  remains on the Medical Service and will be on DVT prophylaxis mechanically and with Lovenox while in the hospital, but will likely not be a candidate for long-term prophylaxis given the dementia.     [name] [name]. [name] [name], M.D.

## 2022-10-09 NOTE — Progress Notes (Signed)
Notified MD patient  refused PO medications tonight to include Aricept, Namenda and Eliquis. I was able talk him into taking his insulin.

## 2022-10-09 NOTE — ED Provider Notes (Signed)
[place] EMERGENCY DEPARTMENT AT [facility] Provider Note   CSN: [id number] Arrival date & time: 10/09/22  1250     History  Chief Complaint  Patient presents with   Altered Mental Status    [name] [name] is a 82 y.o. male with hypertension, type 2 diabetes, dementia, anxiety, brought in to the hospital by his wife who states the urine in his Foley bag has been darker than normal for the past 2 days.  She reports this was placed at his last hospital admission at the end of August and they have not been able to get an appointment with urology for the removal and voiding trial.  Reports patient has been eating and drinking normally, no nausea or vomiting.  He has dementia at baseline and is at his baseline mental status.  Denies any fever or chills at home.  Reports last bowel movement was 2 days ago.  Altered Mental Status      Home Medications Prior to Admission medications   Medication Sig Start Date End Date Taking? Authorizing Provider  acetaminophen (TYLENOL) 325 MG tablet Take 2 tablets (650 mg total) by mouth every 8 (eight) hours as needed for mild pain or moderate pain. 09/03/22   Montez Morita, PA-C  apixaban (ELIQUIS) 2.5 MG TABS tablet Take 1 tablet (2.5 mg total) by mouth 2 (two) times daily. 09/03/22 10/04/22  Montez Morita, PA-C  B-D ULTRAFINE III SHORT PEN 31G X 8 MM MISC USE TO INJECT INSULIN EVERY DAY 05/26/22   Donita Brooks, MD  Blood Glucose Monitoring Suppl [facility] VERIO) w/Device KIT USE TO TEST BLOOD SUGAR LEVELS THREE TIMES DAILY 05/13/15   Dorena Bodo, PA-C  Cholecalciferol (DIALYVITE VITAMIN D 5000) 125 MCG (5000 UT) capsule Take 5,000 Units by mouth daily.    [provider]  clotrimazole (LOTRIMIN AF) 1 % cream Apply 1 Application topically 2 (two) times daily. 09/08/22   Donita Brooks, MD  Continuous Glucose Sensor (DEXCOM G7 SENSOR) MISC Inject 1 Application into the skin as directed. Change sensor every 10 days as directed. 06/17/22    Dani Gobble, NP  donepezil (ARICEPT) 10 MG tablet TAKE 1 TABLET(10 MG) BY MOUTH AT BEDTIME Patient taking differently: Take 10 mg by mouth at bedtime. 04/14/22   Donita Brooks, MD  fluconazole (DIFLUCAN) 150 MG tablet Take 1 tablet (150 mg total) by mouth once a week. 09/08/22   Donita Brooks, MD  HYDROcodone-acetaminophen (NORCO) 5-325 MG tablet Take 1 tablet by mouth every 6 (six) hours as needed for moderate pain. Ok to fill after 9/10 09/17/22   Donita Brooks, MD  insulin glargine (LANTUS SOLOSTAR) 100 UNIT/ML Solostar Pen Inject 8 Units into the skin daily. 09/25/22   Johnson, Clanford L, MD  Lancets (ONETOUCH DELICA PLUS LANCET33G) MISC USE TO CHECK BLOOD SUGAR TWICE DAILY. Dx: E11.9. 11/13/19   Donita Brooks, MD  memantine (NAMENDA) 10 MG tablet TAKE 1 TABLET(10 MG) BY MOUTH TWICE DAILY Patient taking differently: Take 10 mg by mouth 2 (two) times daily. 10/22/21   Donita Brooks, MD  nystatin (MYCOSTATIN/NYSTOP) powder Apply topically 2 (two) times daily. Apply to groin area and scrotum 09/25/22   [name], MD  [facility] VERIO test strip USE TO TEST BLOOD SUGAR TWO TO THREE TIMES DAILY 01/13/22   Donita Brooks, MD  pioglitazone (ACTOS) 45 MG tablet Take 1 tablet (45 mg total) by mouth daily. 06/17/22   Dani Gobble, NP  rosuvastatin (CRESTOR) 40 MG tablet TAKE 1 TABLET(40 MG) BY MOUTH DAILY Patient taking differently: Take 40 mg by mouth daily. 05/11/22   Donita Brooks, MD  tamsulosin (FLOMAX) 0.4 MG CAPS capsule Take 1 capsule (0.4 mg total) by mouth daily after supper. 09/25/22   [name], MD      Allergies    Invokana [canagliflozin] and Plavix [clopidogrel bisulfate]    Review of Systems   Review of Systems  Genitourinary:        Dark urine    Physical Exam Updated Vital Signs BP (!) 155/58   Pulse (!) 110   Temp 98.3 F (36.8 C) (Oral)   Resp (!) 26   Ht 5\' 9"  (1.753 m)   Wt 63 kg   SpO2 94%   BMI 20.51 kg/m  Physical  Exam Vitals and nursing note reviewed.  Constitutional:      General: He is not in acute distress.    Appearance: He is well-developed.     Comments: Foley bag with dark-colored urine  HENT:     Head: Normocephalic and atraumatic.  Eyes:     Conjunctiva/sclera: Conjunctivae normal.  Cardiovascular:     Rate and Rhythm: Regular rhythm. Tachycardia present.     Heart sounds: No murmur heard. Pulmonary:     Effort: Pulmonary effort is normal. No respiratory distress.     Breath sounds: Normal breath sounds.  Abdominal:     Palpations: Abdomen is soft.     Tenderness: There is abdominal tenderness. There is guarding.     Comments: Abdomen tender to palpation diffusely  Musculoskeletal:        General: No swelling.     Cervical back: Neck supple.  Skin:    General: Skin is warm and dry.     Capillary Refill: Capillary refill takes less than 2 seconds.  Neurological:     Mental Status: He is alert.     Comments: Patient oriented to self and place, not to time  Psychiatric:        Mood and Affect: Mood normal.     ED Results / Procedures / Treatments   Labs (all labs ordered are listed, but only abnormal results are displayed) Labs Reviewed  CBC WITH DIFFERENTIAL/PLATELET - Abnormal; Notable for the following components:      Result Value   Platelets 402 (*)    All other components within normal limits  COMPREHENSIVE METABOLIC PANEL - Abnormal; Notable for the following components:   Chloride 97 (*)    Glucose, Bld 391 (*)    BUN 24 (*)    Albumin 3.1 (*)    All other components within normal limits  URINALYSIS, ROUTINE W REFLEX MICROSCOPIC - Abnormal; Notable for the following components:   APPearance TURBID (*)    Glucose, UA >=500 (*)    Hgb urine dipstick MODERATE (*)    Ketones, ur 5 (*)    Protein, ur 100 (*)    Leukocytes,Ua MODERATE (*)    Bacteria, UA FEW (*)    All other components within normal limits  LACTIC ACID, PLASMA - Abnormal; Notable for the  following components:   Lactic Acid, Venous 3.9 (*)    All other components within normal limits  PROTIME-INR - Abnormal; Notable for the following components:   Prothrombin Time 16.3 (*)    INR 1.3 (*)    All other components within normal limits  CULTURE, BLOOD (ROUTINE X 2)  CULTURE, BLOOD (ROUTINE X 2)  RESP PANEL BY RT-PCR (RSV, FLU A&B, COVID)  [id number]  URINE CULTURE  LIPASE, BLOOD  APTT  URINALYSIS, W/ REFLEX TO CULTURE (INFECTION SUSPECTED)    EKG EKG Interpretation Date/Time:  Friday October 09 2022 15:08:58 EDT Ventricular Rate:  106 PR Interval:  115 QRS Duration:  116 QT Interval:  347 QTC Calculation: 461 R Axis:   -76  Text Interpretation: Sinus tachycardia Incomplete RBBB and LAFB New since previous tracing Confirmed by Vanetta Mulders [phone]) on 10/09/2022 3:29:53 PM  Radiology DG Chest Portable 1 View  Result Date: 10/09/2022 CLINICAL DATA:  Altered mental status EXAM: PORTABLE CHEST 1 VIEW COMPARISON:  None Available. FINDINGS: Normal mediastinum and cardiac silhouette. Normal pulmonary vasculature. No evidence of effusion, infiltrate, or pneumothorax. No acute bony abnormality. IMPRESSION: No acute cardiopulmonary process. Electronically Signed   By: Genevive Bi M.D.   On: 10/09/2022 15:47    Procedures .Critical Care  Performed by: Arabella Merles, PA-C Authorized by: Arabella Merles, PA-C   Critical care provider statement:    Critical care time (minutes):  34   Critical care was necessary to treat or prevent imminent or life-threatening deterioration of the following conditions:  Sepsis   Critical care was time spent personally by me on the following activities:  Development of treatment plan with patient or surrogate, discussions with consultants, evaluation of patient's response to treatment, examination of patient, ordering and review of laboratory studies, ordering and review of radiographic studies, ordering and performing treatments and  interventions, pulse oximetry, re-evaluation of patient's condition, review of old charts and obtaining history from patient or surrogate   Care discussed with: admitting provider       Medications Ordered in ED Medications  lactated ringers infusion (has no administration in time range)  ceFEPIme (MAXIPIME) 2 g in sodium chloride 0.9 % 100 mL IVPB (has no administration in time range)  lactated ringers bolus 1,000 mL (0 mLs Intravenous Stopped 10/09/22 1652)    And  lactated ringers bolus 1,000 mL (0 mLs Intravenous Stopped 10/09/22 1652)  ceFEPIme (MAXIPIME) 2 g in sodium chloride 0.9 % 100 mL IVPB (0 g Intravenous Stopped 10/09/22 1652)  iohexol (OMNIPAQUE) 300 MG/ML solution 100 mL (100 mLs Intravenous Contrast Given 10/09/22 1553)    ED Course/ Medical Decision Making/ A&P                                 Medical Decision Making Amount and/or Complexity of Data Reviewed Labs: ordered. Radiology: ordered. ECG/medicine tests: ordered.  Risk Prescription drug management.   82 y.o. male with pertinent past medical history of hypertension, type 2 diabetes, dementia, anxiety presents to the ED for concern of darker urine than normal for the past 2 days  Differential diagnosis includes but is not limited to UTI, pyelonephritis, sepsis, pancreatitis, appendicitis  ED Course:  Sepsis order set initiated immediately upon patient arrival due to tachycardia and increased respiratory rate in the setting of suspected UTI.  Suspect possible urinary source due to the change in urinary color and indwelling catheter. Upon evaluation patient, he is oriented to self and place, but not time.  Wife at bedside reports this is his baseline mental status.  Urine is dark and frothy appearing in the Foley bag and urinalysis shows moderate leukocytes which is concerning for UTI.  Patient is tachycardic and tachypneic.  Abdomen diffusely tender to palpation, and given patient is not able to provide history,  will  further evaluate with CT abdomen pelvis. He appears in no acute distress. CBC with no leukocytosis, CMP without any elevation in LFTs, less concern for any acute intra-abdominal pathology. Patient started on cefepime for probable urinary source of infection.  Patient started on LR boluses.   Impression: Urinary tract infection in setting of indwelling urinary catheter Sepsis  Disposition:  The patient was admitted to the hospitalist Dr. Randol Kern for further IV antibiotics, fluids, change of foley catheter, and further management. Return precautions given.  Lab Tests: I Ordered, and personally interpreted labs.  The pertinent results include:   Urinalysis with moderate leukocytes, hemoglobin, proteinuria CMP without any elevation LFTs, creatinine within normal limits CBC without any leukocytosis Lipase within normal limits  Imaging Studies ordered: I ordered imaging studies including CT Abdomen and pelvis, results pending   Cardiac Monitoring: / EKG: The patient was maintained on a cardiac monitor.  I personally viewed and interpreted the cardiac monitored which showed an underlying rhythm of: Sinus tachycardia    Consultations Obtained: I requested consultation with the hospitalist Dr. Randol Kern,  and discussed lab and imaging findings as well as pertinent plan - they recommend: Admission for further treatment  External records from outside source obtained and reviewed including hospital admission from 8/15 to 8/23 for right femoral neck fracture, AKI and bladder outlet obstruction with Foley catheter placement   Co morbidities that complicate the patient evaluation  hypertension, type 2 diabetes, dementia, anxiety              Final Clinical Impression(s) / ED Diagnoses Final diagnoses:  Acute cystitis with hematuria    Rx / DC Orders ED Discharge Orders     None         Arabella Merles, PA-C 10/09/22 1653    Coral Spikes, DO 10/12/22  [phone]
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
## 2022-10-09 NOTE — Consult Note (Signed)
CODE SEPSIS - PHARMACY COMMUNICATION  **Broad Spectrum Antibiotics should be administered within 1 hour of Sepsis diagnosis**  Time Code Sepsis Called/Page Received: 1443  Antibiotics Ordered: Cefepime 2 g  Time of 1st antibiotic administration: 1506  Additional action taken by pharmacy: N/A  If necessary, Name of Provider/Nurse Contacted: N/A    Will M. Dareen Piano, PharmD Clinical Pharmacist 10/09/2022 9:52 PM

## 2022-10-10 DIAGNOSIS — A419 Sepsis, unspecified organism: Secondary | ICD-10-CM | POA: Diagnosis not present

## 2022-10-10 LAB — BASIC METABOLIC PANEL
Anion gap: 9 (ref 5–15)
BUN: 23 mg/dL (ref 8–23)
CO2: 28 mmol/L (ref 22–32)
Calcium: 8.9 mg/dL (ref 8.9–10.3)
Chloride: 100 mmol/L (ref 98–111)
Creatinine, Ser: 0.88 mg/dL (ref 0.61–1.24)
GFR, Estimated: >60 mL/min (ref >60)
Glucose, Bld: 163 mg/dL — ABNORMAL HIGH (ref 70–99)
Potassium: 3.7 mmol/L (ref 3.5–5.1)
Sodium: 137 mmol/L (ref 135–145)

## 2022-10-10 LAB — GLUCOSE, CAPILLARY
Glucose-Capillary: 196 mg/dL — ABNORMAL HIGH (ref 70–99)
Glucose-Capillary: 403 mg/dL — ABNORMAL HIGH (ref 70–99)
Glucose-Capillary: 262 mg/dL — ABNORMAL HIGH (ref 70–99)
Glucose-Capillary: 216 mg/dL — ABNORMAL HIGH (ref 70–99)

## 2022-10-10 LAB — CBC
HCT: 34 % — ABNORMAL LOW (ref 39.0–52.0)
Hemoglobin: 10.5 g/dL — ABNORMAL LOW (ref 13.0–17.0)
MCH: 30.2 pg (ref 26.0–34.0)
MCHC: 30.9 g/dL (ref 30.0–36.0)
MCV: 97.7 fL (ref 80.0–100.0)
Platelets: 368 10*3/uL (ref 150–400)
RBC: 3.48 MIL/uL — ABNORMAL LOW (ref 4.22–5.81)
RDW: 14.4 % (ref 11.5–15.5)
WBC: 9.4 10*3/uL (ref 4.0–10.5)
nRBC: 0 % (ref 0.0–0.2)

## 2022-10-10 LAB — GLUCOSE, RANDOM: Glucose, Bld: 459 mg/dL — ABNORMAL HIGH (ref 70–99)

## 2022-10-10 LAB — LACTIC ACID, PLASMA: Lactic Acid, Venous: 1.1 mmol/L (ref 0.5–1.9)

## 2022-10-10 MED ORDER — ADULT MULTIVITAMIN W/MINERALS CH
1.0000 | ORAL_TABLET | Freq: Every day | ORAL | Status: DC
  Administered 2022-10-10 – 2022-10-12 (×3): 1 via ORAL
  Filled 2022-10-10 (×3): qty 1

## 2022-10-10 MED ORDER — ENSURE ENLIVE PO LIQD: 237.0000 mL | Freq: Two times a day (BID) | ORAL | Status: DC

## 2022-10-10 MED ORDER — CHLORHEXIDINE GLUCONATE CLOTH 2 % EX PADS
6.0000 | MEDICATED_PAD | Freq: Every day | CUTANEOUS | Status: DC
  Administered 2022-10-11 – 2022-10-12 (×2): 6 via TOPICAL

## 2022-10-10 MED ORDER — GLUCERNA SHAKE PO LIQD
237.0000 mL | Freq: Three times a day (TID) | ORAL | Status: DC
  Administered 2022-10-10 – 2022-10-11 (×4): 237 mL via ORAL

## 2022-10-10 MED ORDER — INSULIN ASPART 100 UNIT/ML IJ SOLN
20.0000 [IU] | Freq: Once | INTRAMUSCULAR | Status: AC
  Administered 2022-10-10: 20 [IU] via SUBCUTANEOUS

## 2022-10-10 NOTE — Progress Notes (Addendum)
PROGRESS NOTE    [name] [name]  ZOX:[id number] DOB: 1940/08/08 DOA: 10/09/2022 PCP: Donita Brooks, MD   Brief Narrative:  [name] [name]  is a 82 y.o. male,  with medical history significant of hypertension, hyperlipidemia, T2DM, dementia, anxiety , with recent hospitalization in August for right hip fracture status post ORIF, and another hospitalization earlier this month for AKI secondary to bladder outlet obstruction.  He was discharged on Foley catheter. -Patient was brought by his wife for altered mental status, reports she noted change in the color and the Foley catheter while urine is darker for the past 2 days, as well as reports decreased oral intake, poor appetite, but he was able to drink fluids, reports she has a follow-up with urology on 10/19/2022, patient himself is very poor historian cannot provide any reliable history, wife reports he had no fever, no chills at home.  He was admitted for sepsis, POA, secondary to UTI in the setting of Foley catheter.  He is now having difficulty with urinary retention and urology, Dr. Lafonda Mosses, consulted for assistance in management.  Assessment & Plan:   Principal Problem:   Sepsis (HCC) Active Problems:   Diabetes mellitus (HCC)   Hypertension   Carotid disease, bilateral (HCC)   Dementia (HCC)   Bladder outlet obstruction   Anemia of chronic disease   Essential hypertension  Assessment and Plan:  Sepsis, POA, in secondary to UTI, Foley related -Present on admission, tachypneic, tachycardic, elevated lactic acid 3.9, with altered mental status -Related to Foley catheter, discussed with staff, this is to be exchanged in ED. -Follow blood cultures, follow urine cultures. -Received IV cefepime in ED, will continue with IV Rocephin 2 g during hospital stay   Urinary retention, indwelling Foley catheter -with recent hospitalization noted for urinary obstruction, discharged with Foley catheter, recommendation for outpatient voiding trial,  wife report follow-up with urologist 10/19/2022 -Continue with Flomax. -Multiple attempts at changing Foley catheter per nursing staff failed, urology to assess   Essential hypertension-stable -Not on any antihypertensive medications at home, blood pressure is elevated, he is on as needed hydralazine   mixed hyperlipidemia -Continue statin.   Type 2 diabetes mellitus with hyperglycemia/hypoglycemia -Continue with glargine 8 units, will add insulin sliding scale-hold Actos -Noted to have significant recurrence of hyperglycemia, monitor carefully   Physical deconditioning Failure to thrive PCM Dementia -PT recommending home health -Will consult nutritionist -With Aricept and Namenda   recent right hip surgery -Continue with PT/OT, he remains on Eliquis for postoperative DVT prophylaxis     DVT prophylaxis:apixaban Code Status: DNR Family Communication: None at bedside Disposition Plan: Continue ongoing treatment for UTI and resolved urinary retention Status is: Inpatient Remains inpatient appropriate because: Need for IV medication.   Consultants:  Urology  Procedures:  None  Antimicrobials:  Anti-infectives (From admission, onward)    Start     Dose/Rate Route Frequency Ordered Stop   10/10/22 0400  ceFEPIme (MAXIPIME) 2 g in sodium chloride 0.9 % 100 mL IVPB  Status:  Discontinued        2 g 200 mL/hr over 30 Minutes Intravenous Every 12 hours 10/09/22 1457 10/09/22 1730   10/10/22 0400  cefTRIAXone (ROCEPHIN) 2 g in sodium chloride 0.9 % 100 mL IVPB        2 g 200 mL/hr over 30 Minutes Intravenous Every 24 hours 10/09/22 1717     10/09/22 1445  ceFEPIme (MAXIPIME) 2 g in sodium chloride 0.9 % 100 mL IVPB  2 g 200 mL/hr over 30 Minutes Intravenous  Once 10/09/22 1442 10/09/22 1652       Subjective: Patient seen and evaluated today with no particular complaints noted.  He has been having some difficulty with voiding per nursing staff and has over 700 mL  in his bladder with worsening abdominal distention.  Multiple failed attempts at placement of Foley catheter noted.  Objective: Vitals:   10/09/22 1908 10/09/22 2120 10/09/22 2259 10/10/22 0316  BP: (!) 142/62 (!) 130/59 (!) 138/59 (!) 131/59  Pulse: (!) 116 (!) 116 (!) 114 (!) 111  Resp:  20 20   Temp: 98.2 F (36.8 C) 98.6 F (37 C) 98.8 F (37.1 C) 98.8 F (37.1 C)  TempSrc: Oral   Oral  SpO2: 99% 97% 100% 95%  Weight: 55.9 kg     Height: 5\' 11"  (1.803 m)       Intake/Output Summary (Last 24 hours) at 10/10/2022 0707 Last data filed at 10/10/2022 0300 Gross per 24 hour  Intake --  Output 150 ml  Net -150 ml   Filed Weights   10/09/22 1321 10/09/22 1908  Weight: 63 kg 55.9 kg    Examination:  General exam: Appears calm and comfortable  Respiratory system: Clear to auscultation. Respiratory effort normal. Cardiovascular system: S1 & S2 heard, RRR.  Gastrointestinal system: Abdomen is soft, minimally distended Central nervous system: Alert and awake Extremities: No edema Skin: No significant lesions noted Psychiatry: Flat affect. Foley with minimal urine output.    Data Reviewed: I have personally reviewed following labs and imaging studies  CBC: Recent Labs  Lab 10/09/22 1416 10/10/22 0413  WBC 6.7 9.4  NEUTROABS 4.9  --   HGB 13.9 10.5*  HCT 43.2 34.0*  MCV 95.2 97.7  PLT 402* 368   Basic Metabolic Panel: Recent Labs  Lab 10/09/22 1416 10/10/22 0413  NA 135 137  K 4.4 3.7  CL 97* 100  CO2 25 28  GLUCOSE 391* 163*  BUN 24* 23  CREATININE 1.02 0.88  CALCIUM 9.3 8.9   GFR: Estimated Creatinine Clearance: 52.1 mL/min (by C-G formula based on SCr of 0.88 mg/dL). Liver Function Tests: Recent Labs  Lab 10/09/22 1416  AST 17  ALT 16  ALKPHOS 87  BILITOT 0.6  PROT 6.8  ALBUMIN 3.1*   Recent Labs  Lab 10/09/22 1416  LIPASE 20   No results for input(s): "AMMONIA" in the last 168 hours. Coagulation Profile: Recent Labs  Lab  10/09/22 1416  INR 1.3*   Cardiac Enzymes: No results for input(s): "CKTOTAL", "CKMB", "CKMBINDEX", "TROPONINI" in the last 168 hours. BNP (last 3 results) No results for input(s): "PROBNP" in the last 8760 hours. HbA1C: No results for input(s): "HGBA1C" in the last 72 hours. CBG: Recent Labs  Lab 10/09/22 1914 10/09/22 2052  GLUCAP 287* 282*   Lipid Profile: No results for input(s): "CHOL", "HDL", "LDLCALC", "TRIG", "CHOLHDL", "LDLDIRECT" in the last 72 hours. Thyroid Function Tests: No results for input(s): "TSH", "T4TOTAL", "FREET4", "T3FREE", "THYROIDAB" in the last 72 hours. Anemia Panel: No results for input(s): "VITAMINB12", "FOLATE", "FERRITIN", "TIBC", "IRON", "RETICCTPCT" in the last 72 hours. Sepsis Labs: Recent Labs  Lab 10/09/22 1614 10/09/22 1841  LATICACIDVEN 3.9* 2.9*    Recent Results (from the past 240 hour(s))  Blood Culture (routine x 2)     Status: None (Preliminary result)   Collection Time: 10/09/22  2:47 PM   Specimen: BLOOD  Result Value Ref Range Status   Specimen Description BLOOD BLOOD  RIGHT FOREARM  Final   Special Requests   Final    BOTTLES DRAWN AEROBIC AND ANAEROBIC Blood Culture adequate volume   Culture   Final    NO GROWTH < 24 HOURS Performed at [facility], [address], [place], [place] [address]    Report Status PENDING  Incomplete  Blood Culture (routine x 2)     Status: None (Preliminary result)   Collection Time: 10/09/22  3:25 PM   Specimen: BLOOD  Result Value Ref Range Status   Specimen Description BLOOD LEFT ANTECUBITAL  Final   Special Requests   Final    BOTTLES DRAWN AEROBIC AND ANAEROBIC Blood Culture results may not be optimal due to an excessive volume of blood received in culture bottles   Culture   Final    NO GROWTH < 24 HOURS Performed at [facility], [address], [place], [place] [address]    Report Status PENDING  Incomplete         Radiology Studies: CT ABDOMEN PELVIS W CONTRAST  Result  Date: 10/09/2022 CLINICAL DATA:  Abdominal pain EXAM: CT ABDOMEN AND PELVIS WITH CONTRAST TECHNIQUE: Multidetector CT imaging of the abdomen and pelvis was performed using the standard protocol following bolus administration of intravenous contrast. RADIATION DOSE REDUCTION: This exam was performed according to the departmental dose-optimization program which includes automated exposure control, adjustment of the mA and/or kV according to patient size and/or use of iterative reconstruction technique. CONTRAST:  OMNIPAQUE IOHEXOL 300 MG/ML  SOLN COMPARISON:  09/13/2022 FINDINGS: Lower chest: Descending thoracic aortic and coronary atherosclerosis. Hepatobiliary: Porcelain gallbladder. Punctate calcifications in the liver compatible with old granulomatous disease. Pancreas: Unremarkable Spleen: Unremarkable Adrenals/Urinary Tract: Foley catheter in the urinary bladder along with gas and a small amount of surrounding fluid in the urinary bladder. Substantially thick-walled urinary bladder diffusely. 1.3 cm Bosniak category 1 cyst in the left mid kidney posteriorly on image 27 series 2. No further imaging workup of this lesion is indicated. Adrenal glands appear unremarkable. Stomach/Bowel: Unremarkable Vascular/Lymphatic: Atherosclerosis is present, including aortoiliac atherosclerotic disease. No pathologic adenopathy. Reproductive: Unremarkable Other: Trace ascites tracking along both paracolic gutters. Mildly increased presacral edema, nonspecific. Musculoskeletal: Right hip arthroplasty. Bony demineralization. Old deformity of the right pubic rami. Lower lumbar spondylosis and degenerative disc disease. IMPRESSION: 1. Substantially thick-walled urinary bladder, correlate with urinalysis to exclude cystitis. A Foley catheter is in place. 2. Trace ascites tracking along both paracolic gutters. 3. Aortic atherosclerosis. 4. Porcelain gallbladder. This is normally an indication for cholecystectomy given the risk  of malignant degeneration, but correlation with patient's clinical scenario is recommended in determining follow up surgical referral. Aortic Atherosclerosis (ICD10-I70.0). Electronically Signed   By: Gaylyn Rong M.D.   On: 10/09/2022 17:25   DG Chest Portable 1 View  Result Date: 10/09/2022 CLINICAL DATA:  Altered mental status EXAM: PORTABLE CHEST 1 VIEW COMPARISON:  None Available. FINDINGS: Normal mediastinum and cardiac silhouette. Normal pulmonary vasculature. No evidence of effusion, infiltrate, or pneumothorax. No acute bony abnormality. IMPRESSION: No acute cardiopulmonary process. Electronically Signed   By: Genevive Bi M.D.   On: 10/09/2022 15:47        Scheduled Meds:  apixaban  2.5 mg Oral BID   cholecalciferol  5,000 Units Oral Daily   donepezil  10 mg Oral QHS   insulin aspart  0-15 Units Subcutaneous TID WC   insulin aspart  0-5 Units Subcutaneous QHS   insulin glargine-yfgn  8 Units Subcutaneous Daily   memantine  10  mg Oral BID   rosuvastatin  40 mg Oral Daily   tamsulosin  0.4 mg Oral QPC supper   Continuous Infusions:  cefTRIAXone (ROCEPHIN)  IV 2 g (10/10/22 0348)   lactated ringers Stopped (10/10/22 0621)     LOS: 1 day    Time spent: 35 minutes    Columbia Pandey Hoover Brunette, DO [facility]  If 7PM-7AM, please contact night-coverage [email or web address] 10/10/2022, 7:07 AM

## 2022-10-10 NOTE — Progress Notes (Signed)
Patient's heart rate consistently sinus tach in the 140's and respirations in the 20's. Dr. Sherryll Burger notified of tachycardia and tachypnea. No new orders at this time. Will continue to monitor.  Manya Silvas, RN
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
## 2022-10-10 NOTE — Plan of Care (Signed)
  Problem: Pain Management: Goal: Pain level will decrease Outcome: Progressing   Problem: Education: Goal: Knowledge of General Education information will improve Description: Including pain rating scale, medication(s)/side effects and non-pharmacologic comfort measures Outcome: Not Progressing   Problem: Health Behavior/Discharge Planning: Goal: Ability to manage health-related needs will improve Outcome: Not Progressing

## 2022-10-10 NOTE — TOC Initial Note (Signed)
Transition of Care [facility]) - Initial/Assessment Note    Patient Details  Name: [name] [name] MRN: [id number] Date of Birth: 02/12/1940  [facility]) CM/SW Contact:    Catalina Gravel, LCSW Phone Number: 10/10/2022, 4:42 PM  Clinical Narrative:                 Pt is high risk for readmission. CSW attempted assessment with pt at bedside, he appeared confused, responded that he lives alone.  CSW later contacted spouse who was at bedside to assess. Spouse and son live in a single family home with pt.  Spouse is his caregiver as well, through a private agency. She completes ADL needs and assists. Pts spouse is able to provide transportation to appointments when needed. Pt has a cane, walker shower chair, hospital bed. Recently active with [facility] for [facility]. CSW explained that if they needed any DME to follow up with TOC or pts PCP if he has been D/C. TOC to follow.    Expected Discharge Plan: Home w Home Health Services Barriers to Discharge: Continued Medical Work up   Patient Goals and CMS Choice Patient states their goals for this hospitalization and ongoing recovery are:: Home          Expected Discharge Plan and Services In-house Referral: Clinical Social Work   Post Acute Care Choice: Durable Medical Equipment Living arrangements for the past 2 months: Single Family Home                                      Prior Living Arrangements/Services Living arrangements for the past 2 months: Single Family Home Lives with:: Spouse, Adult Children Patient language and need for interpreter reviewed:: Yes Do you feel safe going back to the place where you live?: Yes      Need for Family Participation in Patient Care: Yes (Comment) (wife is caregiver)     Criminal Activity/Legal Involvement Pertinent to Current Situation/Hospitalization: No - Comment as needed  Activities of Daily Living   ADL Screening (condition at time of admission) Does the patient have a NEW  difficulty with bathing/dressing/toileting/self-feeding that is expected to last >3 days?: No Does the patient have a NEW difficulty with getting in/out of bed, walking, or climbing stairs that is expected to last >3 days?: No Does the patient have a NEW difficulty with communication that is expected to last >3 days?: No Is the patient deaf or have difficulty hearing?: No Does the patient have difficulty seeing, even when wearing glasses/contacts?: No Does the patient have difficulty concentrating, remembering, or making decisions?: Yes  Permission Sought/Granted                  Emotional Assessment Appearance:: Appears stated age Attitude/Demeanor/Rapport: Unresponsive, Unable to Assess (confused) Affect (typically observed): Quiet, Unable to Assess Orientation: : Oriented to Self Alcohol / Substance Use: Not Applicable    Admission diagnosis:  Acute cystitis with hematuria [N30.01] Sepsis (HCC) [A41.9] Patient Active Problem List   Diagnosis Date Noted   Sepsis (HCC) 10/09/2022   Bladder outlet obstruction 09/14/2022   Hypoalbuminemia due to protein-calorie malnutrition (HCC) 09/14/2022   Leukocytosis 09/14/2022   Thrombocytosis 09/14/2022   Anemia of chronic disease 09/14/2022   Essential hypertension 09/14/2022   Type 2 diabetes mellitus with hyperglycemia (HCC) 09/14/2022   AKI (acute kidney injury) (HCC) 09/14/2022   Acute kidney injury (HCC) 09/13/2022   Dehydration  08/31/2022   Dementia (HCC) 08/31/2022   Fall at home, initial encounter 08/31/2022   Hip fracture (HCC) 08/30/2022   Vitamin D deficiency 05/17/2020   Cerebrovascular accident (CVA) (HCC) 05/17/2020   Carotid stenosis 06/15/2016   Carotid disease, bilateral (HCC) 04/12/2015   History of colonic polyps    Iron deficiency anemia 12/21/2013   Smoker 12/21/2013   Anxiety    Diabetes mellitus (HCC)    Diabetes mellitus type 2, uncomplicated (HCC)    Mixed hyperlipidemia    Hypertension     Cellulitis of foot, left 08/09/2010   PCP:  Donita Brooks, MD Pharmacy:   [facility] [id number] - [place], [place] - [address] AT SEC [address] [place] [address] Phone: [phone] Fax: [phone]     Social Determinants of Health (SDOH) Social History: SDOH Screenings   Food Insecurity: Patient Unable To Answer (10/09/2022)  Housing: Patient Unable To Answer (10/09/2022)  Transportation Needs: Patient Unable To Answer (10/09/2022)  Utilities: Patient Unable To Answer (10/09/2022)  Alcohol Screen: Low Risk  (11/05/2021)  Depression (PHQ2-9): High Risk (11/05/2021)  Financial Resource Strain: Low Risk  (11/05/2021)  Physical Activity: Insufficiently Active (11/05/2021)  Social Connections: Moderately Integrated (11/05/2021)  Stress: No Stress Concern Present (11/05/2021)  Tobacco Use: Medium Risk (10/09/2022)   SDOH Interventions:     Readmission Risk Interventions    10/10/2022    4:39 PM  Readmission Risk Prevention Plan  Transportation Screening Complete  PCP or Specialist Appt within 3-5 Days Complete  HRI or Home Care Consult Complete  Social Work Consult for Recovery Care Planning/Counseling Complete  Palliative Care Screening Not Applicable  Medication Review Oceanographer) Complete
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
## 2022-10-10 NOTE — Progress Notes (Signed)
Urologist here to evaluate patient and foley catheter due to minimal urine output today. MD at bedside for one hour exchanging foley catheter x2, draining a total of 500 ml of cloudy yellow urine. Pt tolerated procedures well.   Pt has been tachycardic the entire shift with rates 115-150. Post procedure, pt's current heart rate is 123/min, ST on monitor.

## 2022-10-10 NOTE — Evaluation (Signed)
Physical Therapy Evaluation Patient Details Name: [name] [name] MRN: [id number] DOB: Nov 23, 1940 Today's Date: 10/10/2022  History of Present Illness  [name] [name]  is a 82 y.o. male,  with medical history significant of hypertension, hyperlipidemia, T2DM, dementia, anxiety , with recent hospitalization in August for right hip fracture status post ORIF, and another hospitalization earlier this month for AKI secondary to bladder outlet obstruction.  He was discharged on Foley catheter.  -Patient was brought by his wife for altered mental status, reports she noted change in the color and the Foley catheter while urine is darker for the past 2 days, as well as reports decreased oral intake, poor appetite, but he was able to drink fluids, reports she has a follow-up with urology on 10/19/2022, patient himself is very poor historian cannot provide any reliable history, wife reports he had no fever, no chills at home.   Clinical Impression  Patient required increased time for completing functional tasks mostly due to confusion and easily agitated.  Patient demonstrates fair/good sitting balance seated at EOB, poor carryover for attempting to use RW for transfers and required step pivot with Mod assist to transfer to chair.  Patient tolerated sitting up in chair after therapy with his spouse present in room - nursing staff aware.  Patient will benefit from continued skilled physical therapy in hospital and recommended venue below to increase strength, balance, endurance for safe ADLs and gait.          If plan is discharge home, recommend the following: A lot of help with walking and/or transfers;Help with stairs or ramp for entrance;A lot of help with bathing/dressing/bathroom;Assistance with cooking/housework   Can travel by private vehicle   No    Equipment Recommendations None recommended by PT  Recommendations for Other Services       Functional Status Assessment Patient has had a recent decline  in their functional status and demonstrates the ability to make significant improvements in function in a reasonable and predictable amount of time.     Precautions / Restrictions Precautions Precautions: Fall Restrictions Weight Bearing Restrictions: Yes RLE Weight Bearing: Weight bearing as tolerated Other Position/Activity Restrictions: posterior hip precautions      Mobility  Bed Mobility Overal bed mobility: Needs Assistance Bed Mobility: Supine to Sit     Supine to sit: Min assist     General bed mobility comments: increased time, labored movement, required much encouragement    Transfers Overall transfer level: Needs assistance Equipment used: 1 person hand held assist Transfers: Sit to/from Stand, Bed to chair/wheelchair/BSC Sit to Stand: Mod assist   Step pivot transfers: Mod assist, Max assist       General transfer comment: poor carryover for attempting to use RW, required stand pivot to transfer to chair    Ambulation/Gait Ambulation/Gait assistance: Max assist Gait Distance (Feet): 2 Feet Assistive device: None, 2 person hand held assist Gait Pattern/deviations: Decreased step length - left, Decreased stance time - right, Decreased stride length Gait velocity: slow     General Gait Details: limited to a couple of side steps with hand held assist mostly due to weakness and easily agitated  Stairs            Wheelchair Mobility     Tilt Bed    Modified Rankin (Stroke Patients Only)       Balance Overall balance assessment: Needs assistance Sitting-balance support: Feet supported, No upper extremity supported Sitting balance-Leahy Scale: Fair Sitting balance - Comments: fair/good seated at EOB  Standing balance support: During functional activity, No upper extremity supported Standing balance-Leahy Scale: Poor Standing balance comment: hand held assist                             Pertinent Vitals/Pain Pain  Assessment Pain Assessment: Faces Faces Pain Scale: Hurts a little bit Pain Location: RLE with movement Pain Descriptors / Indicators: Sore Pain Intervention(s): Limited activity within patient's tolerance, Monitored during session, Repositioned    Home Living Family/patient expects to be discharged to:: Private residence Living Arrangements: Spouse/significant other Available Help at Discharge: Family;Available 24 hours/day Type of Home: House Home Access: Stairs to enter   [employer] of Steps: 1   Home Layout: One level Home Equipment: Agricultural consultant (2 wheels);Hand held shower head;Shower seat;Cane - single point;Grab bars - tub/shower;Wheelchair - manual      Prior Function Prior Level of Function : Needs assist       Physical Assist : Mobility (physical);ADLs (physical) Mobility (physical): Bed mobility;Transfers;Gait;Stairs   Mobility Comments: Assisted household ambulator using RW ADLs Comments: self feed, otherwise assisted for bathing, dressing, toileting and all IADLs     Extremity/Trunk Assessment   Upper Extremity Assessment Upper Extremity Assessment: Defer to OT evaluation    Lower Extremity Assessment Lower Extremity Assessment: Generalized weakness    Cervical / Trunk Assessment Cervical / Trunk Assessment: Kyphotic  Communication   Communication Communication: No apparent difficulties Cueing Techniques: Verbal cues;Tactile cues  Cognition Arousal: Alert Behavior During Therapy: Anxious, Agitated Overall Cognitive Status: History of cognitive impairments - at baseline                                 General Comments: requires repeated verbal/tactile for participating with therapy        General Comments      Exercises     Assessment/Plan    PT Assessment Patient needs continued PT services  PT Problem List Decreased strength;Decreased activity tolerance;Decreased balance;Decreased mobility       PT  Treatment Interventions DME instruction;Gait training;Stair training;Functional mobility training;Therapeutic activities;Therapeutic exercise;Balance training;Patient/family education    PT Goals (Current goals can be found in the Care Plan section)  Acute Rehab PT Goals Patient Stated Goal: return home with family to assist PT Goal Formulation: With patient/family Time For Goal Achievement: 10/15/22 Potential to Achieve Goals: Good    Frequency Min 3X/week     Co-evaluation               AM-PAC PT "6 Clicks" Mobility  Outcome Measure Help needed turning from your back to your side while in a flat bed without using bedrails?: A Lot Help needed moving from lying on your back to sitting on the side of a flat bed without using bedrails?: A Lot Help needed moving to and from a bed to a chair (including a wheelchair)?: A Lot Help needed standing up from a chair using your arms (e.g., wheelchair or bedside chair)?: A Lot Help needed to walk in hospital room?: A Lot Help needed climbing 3-5 steps with a railing? : Total 6 Click Score: 11    End of Session   Activity Tolerance: Patient tolerated treatment well;Patient limited by fatigue Patient left: in chair;with call bell/phone within reach;with chair alarm set;with family/visitor present Nurse Communication: Mobility status PT Visit Diagnosis: Unsteadiness on feet (R26.81);Muscle weakness (generalized) (M62.81);Other abnormalities of gait and mobility (R26.89);Pain Pain -  Right/Left: Right Pain - part of body: Hip    Time: 1006-1036 PT Time Calculation (min) (ACUTE ONLY): 30 min   Charges:   PT Evaluation $PT Eval Moderate Complexity: 1 Mod PT Treatments $Therapeutic Activity: 23-37 mins PT General Charges $$ ACUTE PT VISIT: 1 Visit         12:44 PM, 10/10/22 Ocie Bob, MPT Physical Therapist with [facility] [phone] office [phone] mobile phone

## 2022-10-10 NOTE — Plan of Care (Signed)
  Problem: Acute Rehab PT Goals(only PT should resolve) Goal: Pt Will Go Supine/Side To Sit Outcome: Progressing Flowsheets (Taken 10/10/2022 1246) Pt will go Supine/Side to Sit:  with minimal assist  with contact guard assist Goal: Patient Will Transfer Sit To/From Stand Outcome: Progressing Flowsheets (Taken 10/10/2022 1246) Patient will transfer sit to/from stand:  with minimal assist  with contact guard assist Goal: Pt Will Transfer Bed To Chair/Chair To Bed Outcome: Progressing Flowsheets (Taken 10/10/2022 1246) Pt will Transfer Bed to Chair/Chair to Bed:  with min assist  with mod assist Goal: Pt Will Ambulate Outcome: Progressing Flowsheets (Taken 10/10/2022 1246) Pt will Ambulate:  10 feet  with moderate assist  with rolling walker   12:47 PM, 10/10/22 Ocie Bob, MPT Physical Therapist with [facility] [phone] office [phone] mobile phone

## 2022-10-10 NOTE — Progress Notes (Signed)
0400 went to hang antibiotic and noticed that there was very little urine in the foley bag. I know that the patient had received two bags of LR at change of shift and I had started a bag of LR at 75ml an hour. I checked to see if the tech had charted any output and only had been charted.  [phone] bladder scan showed however this was not a good scan due to the patient was having pain with pressing on the abdomen.  0600 removed old foley and attempted to place new foley.  16 fr coude was placed with no resistance but no urine was returned. We placed patient in trendelenburg but this did not help. Sent nurse to get different size coudes. Notified MD that we replaced foley but there was no urine output  0615  50fr foley placed without resistance and there was urine returned but not a lot. Abdomen is distended. I have requested to turn off fluids at this time due to inability to urinate.

## 2022-10-10 NOTE — Progress Notes (Signed)
Initial Nutrition Assessment  DOCUMENTATION CODES:   Underweight  INTERVENTION:   -Glucerna Shake po TID, each supplement provides 220 kcal and 10 grams of protein   -Multivitamin with minerals daily  NUTRITION DIAGNOSIS:   Increased nutrient needs related to acute illness as evidenced by estimated needs.  GOAL:   Patient will meet greater than or equal to 90% of their needs  MONITOR:   PO intake, Supplement acceptance, Labs, Weight trends, I & O's  REASON FOR ASSESSMENT:   Consult Assessment of nutrition requirement/status  ASSESSMENT:   82 y.o. male,  with medical history significant of hypertension, hyperlipidemia, T2DM, dementia, anxiety , with recent hospitalization in August for right hip fracture status post ORIF, and another hospitalization earlier this month for AKI secondary to bladder outlet obstruction. Currently admitted for AMS.  Patient eating very little initially this admission. Had an episode where he started gagging on oatmeal and eggs. Family states pt has not been eating well at home. This morning, he ate 100% of a biscuit someone brought him. CBGs running high in 300s and 400s. Will order Glucerna shakes for additional kcals and protein.  Per weight records, pt has lost 16 lbs since 8/18 (11% wt loss x 1.5 months, significant for time frame).  Medications: Vitamin D  Labs reviewed: CBGs: 196-403   NUTRITION - FOCUSED PHYSICAL EXAM:  Unable to complete, working remotely.  Diet Order:   Diet Order             Diet heart healthy/carb modified Room service appropriate? Yes; Fluid consistency: Thin  Diet effective now                   EDUCATION NEEDS:   No education needs have been identified at this time  Skin:  Skin Assessment: Skin Integrity Issues: Skin Integrity Issues:: Stage II Stage II: sacrum  Last BM:  9/27  Height:   Ht Readings from Last 1 Encounters:  10/09/22 5\' 11"  (1.803 m)    Weight:   Wt Readings from  Last 1 Encounters:  10/09/22 55.9 kg    BMI:  Body mass index is 17.19 kg/m.  Estimated Nutritional Needs:   Kcal:  1800-2000  Protein:  85-95g  Fluid:  2L/day  Tilda Franco, MS, RD, LDN Inpatient Clinical Dietitian Contact information available via Amion

## 2022-10-11 DIAGNOSIS — F0390 Unspecified dementia without behavioral disturbance: Secondary | ICD-10-CM | POA: Diagnosis not present

## 2022-10-11 DIAGNOSIS — A419 Sepsis, unspecified organism: Secondary | ICD-10-CM | POA: Diagnosis not present

## 2022-10-11 DIAGNOSIS — E1165 Type 2 diabetes mellitus with hyperglycemia: Secondary | ICD-10-CM

## 2022-10-11 DIAGNOSIS — N320 Bladder-neck obstruction: Secondary | ICD-10-CM

## 2022-10-11 DIAGNOSIS — I10 Essential (primary) hypertension: Secondary | ICD-10-CM

## 2022-10-11 DIAGNOSIS — N3001 Acute cystitis with hematuria: Secondary | ICD-10-CM

## 2022-10-11 LAB — BASIC METABOLIC PANEL
Anion gap: 8 (ref 5–15)
BUN: 24 mg/dL — ABNORMAL HIGH (ref 8–23)
CO2: 29 mmol/L (ref 22–32)
Calcium: 9.1 mg/dL (ref 8.9–10.3)
Chloride: 97 mmol/L — ABNORMAL LOW (ref 98–111)
Creatinine, Ser: 1.1 mg/dL (ref 0.61–1.24)
GFR, Estimated: >60 mL/min (ref >60)
Glucose, Bld: 188 mg/dL — ABNORMAL HIGH (ref 70–99)
Potassium: 3.2 mmol/L — ABNORMAL LOW (ref 3.5–5.1)
Sodium: 134 mmol/L — ABNORMAL LOW (ref 135–145)

## 2022-10-11 LAB — URINE CULTURE: Culture: >=100000 — AB

## 2022-10-11 LAB — MAGNESIUM: Magnesium: 1.8 mg/dL (ref 1.7–2.4)

## 2022-10-11 LAB — CBC
HCT: 32.9 % — ABNORMAL LOW (ref 39.0–52.0)
Hemoglobin: 10.3 g/dL — ABNORMAL LOW (ref 13.0–17.0)
MCH: 29.9 pg (ref 26.0–34.0)
MCHC: 31.3 g/dL (ref 30.0–36.0)
MCV: 95.4 fL (ref 80.0–100.0)
Platelets: 365 10*3/uL (ref 150–400)
RBC: 3.45 MIL/uL — ABNORMAL LOW (ref 4.22–5.81)
RDW: 14 % (ref 11.5–15.5)
WBC: 10.9 10*3/uL — ABNORMAL HIGH (ref 4.0–10.5)
nRBC: 0 % (ref 0.0–0.2)

## 2022-10-11 LAB — GLUCOSE, CAPILLARY
Glucose-Capillary: 168 mg/dL — ABNORMAL HIGH (ref 70–99)
Glucose-Capillary: 193 mg/dL — ABNORMAL HIGH (ref 70–99)
Glucose-Capillary: 199 mg/dL — ABNORMAL HIGH (ref 70–99)
Glucose-Capillary: 320 mg/dL — ABNORMAL HIGH (ref 70–99)
Glucose-Capillary: 304 mg/dL — ABNORMAL HIGH (ref 70–99)

## 2022-10-11 NOTE — Progress Notes (Signed)
Patient slept most of the night during this shift. Foley output for this shift is 1050 ml. Continues to be tachycardic and a yellow mews. Complained of all over pain x1, prn medication given. Plan of care ongoing.

## 2022-10-11 NOTE — Progress Notes (Signed)
Pt has slept most of this shift. Easily aroused, oriented to person only but cooperative and pleasant. Did not take large amount of any meal today but drinks glucerna and meds crushed with applesauce and ice cream without difficulty Does c/o abd tenderness with palpation but otherwise states he feels, "a whole lot better." Foley intact, draining yellow (occasionally pink tinged) urine, output > 600 ml. Heart rate remains tachycardic but rate 105-120 today, Other VSS. Wife has been at bedside today, updated on condition and plan of care.

## 2022-10-11 NOTE — Plan of Care (Signed)
  Problem: Education: Goal: Verbalization of understanding the information provided (i.e., activity precautions, restrictions, etc) will improve Outcome: Progressing Goal: Individualized Educational Video(s) Outcome: Progressing   Problem: Activity: Goal: Ability to ambulate and perform ADLs will improve Outcome: Progressing   Problem: Clinical Measurements: Goal: Postoperative complications will be avoided or minimized Outcome: Progressing   Problem: Self-Concept: Goal: Ability to maintain and perform role responsibilities to the fullest extent possible will improve Outcome: Progressing   Problem: Pain Management: Goal: Pain level will decrease Outcome: Progressing   Problem: Education: Goal: Knowledge of General Education information will improve Description: Including pain rating scale, medication(s)/side effects and non-pharmacologic comfort measures Outcome: Progressing   Problem: Health Behavior/Discharge Planning: Goal: Ability to manage health-related needs will improve Outcome: Progressing   Problem: Clinical Measurements: Goal: Ability to maintain clinical measurements within normal limits will improve Outcome: Progressing Goal: Will remain free from infection Outcome: Progressing Goal: Diagnostic test results will improve Outcome: Progressing Goal: Respiratory complications will improve Outcome: Progressing Goal: Cardiovascular complication will be avoided Outcome: Progressing   Problem: Activity: Goal: Risk for activity intolerance will decrease Outcome: Progressing   Problem: Nutrition: Goal: Adequate nutrition will be maintained Outcome: Progressing   Problem: Coping: Goal: Level of anxiety will decrease Outcome: Progressing   Problem: Elimination: Goal: Will not experience complications related to bowel motility Outcome: Progressing Goal: Will not experience complications related to urinary retention Outcome: Progressing   Problem: Pain  Managment: Goal: General experience of comfort will improve Outcome: Progressing   Problem: Safety: Goal: Ability to remain free from injury will improve Outcome: Progressing   Problem: Skin Integrity: Goal: Risk for impaired skin integrity will decrease Outcome: Progressing   Problem: Fluid Volume: Goal: Hemodynamic stability will improve Outcome: Progressing   Problem: Clinical Measurements: Goal: Diagnostic test results will improve Outcome: Progressing Goal: Signs and symptoms of infection will decrease Outcome: Progressing   Problem: Respiratory: Goal: Ability to maintain adequate ventilation will improve Outcome: Progressing   Problem: Education: Goal: Ability to describe self-care measures that may prevent or decrease complications (Diabetes Survival Skills Education) will improve Outcome: Progressing Goal: Individualized Educational Video(s) Outcome: Progressing   Problem: Coping: Goal: Ability to adjust to condition or change in health will improve Outcome: Progressing   Problem: Fluid Volume: Goal: Ability to maintain a balanced intake and output will improve Outcome: Progressing   Problem: Health Behavior/Discharge Planning: Goal: Ability to identify and utilize available resources and services will improve Outcome: Progressing Goal: Ability to manage health-related needs will improve Outcome: Progressing   Problem: Metabolic: Goal: Ability to maintain appropriate glucose levels will improve Outcome: Progressing   Problem: Nutritional: Goal: Maintenance of adequate nutrition will improve Outcome: Progressing Goal: Progress toward achieving an optimal weight will improve Outcome: Progressing   Problem: Skin Integrity: Goal: Risk for impaired skin integrity will decrease Outcome: Progressing   Problem: Tissue Perfusion: Goal: Adequacy of tissue perfusion will improve Outcome: Progressing

## 2022-10-11 NOTE — Progress Notes (Signed)
PROGRESS NOTE    [name] [name]  JJO:[id number] DOB: 1940-09-03 DOA: 10/09/2022 PCP: Donita Brooks, MD   Brief Narrative:  [name] [name]  is a 82 y.o. male,  with medical history significant of hypertension, hyperlipidemia, T2DM, dementia, anxiety , with recent hospitalization in August for right hip fracture status post ORIF, and another hospitalization earlier this month for AKI secondary to bladder outlet obstruction.  He was discharged on Foley catheter. -Patient was brought by his wife for altered mental status, reports she noted change in the color and the Foley catheter while urine is darker for the past 2 days, as well as reports decreased oral intake, poor appetite, but he was able to drink fluids, reports she has a follow-up with urology on 10/19/2022, patient himself is very poor historian cannot provide any reliable history, wife reports he had no fever, no chills at home.  He was admitted for sepsis, POA, secondary to UTI in the setting of Foley catheter.  He is now having difficulty with urinary retention and urology, [name] [name], consulted for assistance in management.  Assessment & Plan:   Principal Problem:   Sepsis (HCC) Active Problems:   Diabetes mellitus (HCC)   Hypertension   Carotid disease, bilateral (HCC)   Dementia (HCC)   Bladder outlet obstruction   Anemia of chronic disease   Essential hypertension  Assessment and Plan:  Sepsis, POA, in secondary to Proteus mirabilis UTI, Foley related -Present on admission, tachypneic, tachycardic, elevated lactic acid 3.9, with altered mental status -Related to Foley catheter, discussed with staff, this is to be exchanged in ED. -Follow blood cultures, follow urine cultures. -Received IV cefepime in ED, will continue with IV Rocephin 2 g during hospital stay -Urine culture demonstrating microorganism resistant to nitrofurantoin; otherwise pansensitive. -Planning to discharge home in the next 24 hours if otherwise stable  use and cefadroxil to complete therapy.   Urinary retention, indwelling Foley catheter -with recent hospitalization noted for urinary obstruction, discharged with Foley catheter, recommendation for outpatient voiding trial, wife report follow-up with urologist 10/19/2022 -Continue with Flomax. -Foley catheter has been replaced over wire by urology service -Currently flowing/functioning adequately. -Outpatient follow-up with urology recommended.   Essential hypertension-stable -Not on any antihypertensive medications at home, blood pressure is elevated, he is on as needed hydralazine -Continue to follow vital signs.   mixed hyperlipidemia -Continue statin.   Type 2 diabetes mellitus with hyperglycemia/hypoglycemia -Continue with glargine 8 units, will add insulin sliding scale-hold Actos -Continue to follow CBG fluctuation and adjust hypoglycemic regimen as required.   Physical deconditioning Failure to thrive PCM Dementia -PT recommending home health -Continue Aricept and Namenda -Continue feeding supplements.   recent right hip surgery -Continue with PT/OT, he remains on Eliquis for postoperative DVT prophylaxis  -Planning for home health PT at discharge.    DVT prophylaxis:apixaban Code Status: DNR Family Communication: Wife at bedside. Disposition Plan: Continue ongoing treatment for UTI and resolved urinary retention  Status is: Inpatient  Remains inpatient appropriate because: Need for IV medication.   Consultants:  Urology  Procedures:  None  Antimicrobials:  Anti-infectives (From admission, onward)    Start     Dose/Rate Route Frequency Ordered Stop   10/10/22 0400  ceFEPIme (MAXIPIME) 2 g in sodium chloride 0.9 % 100 mL IVPB  Status:  Discontinued        2 g 200 mL/hr over 30 Minutes Intravenous Every 12 hours 10/09/22 1457 10/09/22 1730   10/10/22 0400  cefTRIAXone (ROCEPHIN) 2 g in  sodium chloride 0.9 % 100 mL IVPB        2 g 200 mL/hr over 30  Minutes Intravenous Every 24 hours 10/09/22 1717     10/09/22 1445  ceFEPIme (MAXIPIME) 2 g in sodium chloride 0.9 % 100 mL IVPB        2 g 200 mL/hr over 30 Minutes Intravenous  Once 10/09/22 1442 10/09/22 1652       Subjective: Status post Foley catheter replacement by urology service; no abdominal pain, good urine output.  Objective: Vitals:   10/10/22 2229 10/11/22 0312 10/11/22 1054 10/11/22 1303  BP: (!) 150/63 131/73 132/69 (!) 118/51  Pulse: (!) 122 (!) 114 (!) 104 (!) 114  Resp: (!) 22  20 20   Temp: 98.2 F (36.8 C) 98.8 F (37.1 C) 98.4 F (36.9 C) 98 F (36.7 C)  TempSrc:  Oral Oral Axillary  SpO2: 99% 98% 100% 98%  Weight:      Height:        Intake/Output Summary (Last 24 hours) at 10/11/2022 1907 Last data filed at 10/11/2022 1500 Gross per 24 hour  Intake 120 ml  Output 1700 ml  Net -1580 ml   Filed Weights   10/09/22 1321 10/09/22 1908  Weight: 63 kg 55.9 kg    Examination: General exam: Sleeping/resting at time of examination.  In no acute distress.  Per wife at bedside no complaining of any abdominal discomfort and has been passing urine adequately after Foley placement by urology service. Respiratory system: Good saturation on room air; no using accessory muscles. Cardiovascular system:RRR.  No rubs, no gallops, no JVD. Gastrointestinal system: Abdomen is nondistended, soft and nontender.  Bowel sounds appreciated. GU: Foley catheter in place; urine adequately flowing; very little orange tinge on examination.  No significant hematuria appreciated. Central nervous system: Limited examination secondary to underlying dementia.  No focal neurological deficits. Extremities: No cyanosis or clubbing. Skin: No petechiae.  Stage II sacral injury present at time of admission without signs of superimposed infection. Psychiatry: Judgement and insight appear impaired secondary to underlying dementia; flat affect appreciated.  Data Reviewed: I have personally  reviewed following labs and imaging studies  CBC: Recent Labs  Lab 10/09/22 1416 10/10/22 0413 10/11/22 0656  WBC 6.7 9.4 10.9*  NEUTROABS 4.9  --   --   HGB 13.9 10.5* 10.3*  HCT 43.2 34.0* 32.9*  MCV 95.2 97.7 95.4  PLT 402* 368 365   Basic Metabolic Panel: Recent Labs  Lab 10/09/22 1416 10/10/22 0413 10/10/22 1142 10/11/22 0656  NA 135 137  --  134*  K 4.4 3.7  --  3.2*  CL 97* 100  --  97*  CO2 25 28  --  29  GLUCOSE 391* 163* 459* 188*  BUN 24* 23  --  24*  CREATININE 1.02 0.88  --  1.10  CALCIUM 9.3 8.9  --  9.1  MG  --   --   --  1.8   GFR: Estimated Creatinine Clearance: 41.6 mL/min (by C-G formula based on SCr of 1.1 mg/dL).  Liver Function Tests: Recent Labs  Lab 10/09/22 1416  AST 17  ALT 16  ALKPHOS 87  BILITOT 0.6  PROT 6.8  ALBUMIN 3.1*   Recent Labs  Lab 10/09/22 1416  LIPASE 20   Coagulation Profile: Recent Labs  Lab 10/09/22 1416  INR 1.3*   CBG: Recent Labs  Lab 10/10/22 1642 10/10/22 2226 10/11/22 0740 10/11/22 1133 10/11/22 1611  GLUCAP 262* 216* 168* 193*  199*   Sepsis Labs: Recent Labs  Lab 10/09/22 1614 10/09/22 1841 10/10/22 0413  LATICACIDVEN 3.9* 2.9* 1.1    Recent Results (from the past 240 hour(s))  Remove and replace urinary cath (placed > 5 days) then obtain urine culture from new indwelling urinary catheter.     Status: Abnormal   Collection Time: 10/09/22  2:26 PM   Specimen: Urine, Catheterized  Result Value Ref Range Status   Specimen Description   Final    URINE, CATHETERIZED Performed at [facility], [address] [place], [place] [address]    Special Requests   Final    NONE Performed at [facility], [address], [place], [place] [address]    Culture >=100,000 COLONIES/mL PROTEUS MIRABILIS (A)  Final   Report Status 10/11/2022 FINAL  Final   Organism ID, Bacteria PROTEUS MIRABILIS (A)  Final      Susceptibility   Proteus mirabilis - MIC*    AMPICILLIN <=2 SENSITIVE Sensitive      CEFAZOLIN <=4 SENSITIVE Sensitive     CEFEPIME <=0.12 SENSITIVE Sensitive     CEFTRIAXONE <=0.25 SENSITIVE Sensitive     CIPROFLOXACIN <=0.25 SENSITIVE Sensitive     GENTAMICIN <=1 SENSITIVE Sensitive     IMIPENEM 4 SENSITIVE Sensitive     NITROFURANTOIN 128 RESISTANT Resistant     TRIMETH/SULFA <=20 SENSITIVE Sensitive     AMPICILLIN/SULBACTAM <=2 SENSITIVE Sensitive     PIP/TAZO <=4 SENSITIVE Sensitive     * >=100,000 COLONIES/mL PROTEUS MIRABILIS  Blood Culture (routine x 2)     Status: None (Preliminary result)   Collection Time: 10/09/22  2:47 PM   Specimen: BLOOD  Result Value Ref Range Status   Specimen Description BLOOD BLOOD RIGHT FOREARM  Final   Special Requests   Final    BOTTLES DRAWN AEROBIC AND ANAEROBIC Blood Culture adequate volume   Culture   Final    NO GROWTH 2 DAYS Performed at [facility], [address], [place], [place] [address]    Report Status PENDING  Incomplete  Blood Culture (routine x 2)     Status: None (Preliminary result)   Collection Time: 10/09/22  3:25 PM   Specimen: BLOOD  Result Value Ref Range Status   Specimen Description BLOOD LEFT ANTECUBITAL  Final   Special Requests   Final    BOTTLES DRAWN AEROBIC AND ANAEROBIC Blood Culture results may not be optimal due to an excessive volume of blood received in culture bottles   Culture   Final    NO GROWTH 2 DAYS Performed at [facility], [address], [place], [place] [address]    Report Status PENDING  Incomplete    Scheduled Meds:  apixaban  2.5 mg Oral BID   Chlorhexidine Gluconate Cloth  6 each Topical Daily   cholecalciferol  5,000 Units Oral Daily   donepezil  10 mg Oral QHS   feeding supplement (GLUCERNA SHAKE)  237 mL Oral TID BM   insulin aspart  0-15 Units Subcutaneous TID WC   insulin aspart  0-5 Units Subcutaneous QHS   insulin glargine-yfgn  8 Units Subcutaneous Daily   memantine  10 mg Oral BID   multivitamin with minerals  1 tablet Oral Daily   rosuvastatin  40 mg Oral  Daily   tamsulosin  0.4 mg Oral QPC supper   Continuous Infusions:  cefTRIAXone (ROCEPHIN)  IV 2 g (10/11/22 0319)     LOS: 2 days    Time spent: 35 minutes  Vassie Loll, MD [facility]  If 7PM-7AM, please contact night-coverage [email or web address] 10/11/2022, 7:07 PM

## 2022-10-12 ENCOUNTER — Other Ambulatory Visit: Payer: Self-pay | Admitting: *Deleted

## 2022-10-12 DIAGNOSIS — N3001 Acute cystitis with hematuria: Secondary | ICD-10-CM | POA: Diagnosis not present

## 2022-10-12 DIAGNOSIS — I10 Essential (primary) hypertension: Secondary | ICD-10-CM | POA: Diagnosis not present

## 2022-10-12 DIAGNOSIS — N320 Bladder-neck obstruction: Secondary | ICD-10-CM | POA: Diagnosis not present

## 2022-10-12 DIAGNOSIS — A419 Sepsis, unspecified organism: Secondary | ICD-10-CM

## 2022-10-12 DIAGNOSIS — D638 Anemia in other chronic diseases classified elsewhere: Secondary | ICD-10-CM

## 2022-10-12 LAB — GLUCOSE, CAPILLARY
Glucose-Capillary: 146 mg/dL — ABNORMAL HIGH (ref 70–99)
Glucose-Capillary: 212 mg/dL — ABNORMAL HIGH (ref 70–99)
Glucose-Capillary: 87 mg/dL (ref 70–99)

## 2022-10-12 MED ORDER — CEFADROXIL 500 MG PO CAPS: 500.0000 mg | ORAL_CAPSULE | Freq: Two times a day (BID) | ORAL | 0 refills | Status: AC

## 2022-10-12 MED ORDER — TAMSULOSIN HCL 0.4 MG PO CAPS: 0.4000 mg | ORAL_CAPSULE | Freq: Every day | ORAL | 2 refills | Status: DC

## 2022-10-12 NOTE — TOC Transition Note (Signed)
Transition of Care [facility]) - CM/SW Discharge Note   Patient Details  Name: [name] [name] MRN: [id number] Date of Birth: January 21, 1940  [facility] of [facility] CM/SW Contact:  Armanda Heritage, RN Phone Number: 10/12/2022, 10:13 AM   Clinical Narrative:     Patient is anticipated to discharge home today.  HHPT services arranged with [facility] accepted referral.  No further TOC needs identified at this time.  Final next level of care: Home w Home Health Services Barriers to Discharge: No Barriers Identified   Patient Goals and CMS Choice      Discharge Placement                         Discharge Plan and Services Additional resources added to the After Visit Summary for   In-house Referral: Clinical Social Work   Post Acute Care Choice: Durable Medical Equipment                    HH Arranged: PT HH Agency: [facility] Date [facility] Agency Contacted: 10/12/22 Time HH Agency Contacted: 1013 Representative spoke with at [facility] Agency: Kandee Keen  Social Determinants of Health (SDOH) Interventions SDOH Screenings   Food Insecurity: Patient Unable To Answer (10/09/2022)  Housing: Patient Unable To Answer (10/09/2022)  Transportation Needs: Patient Unable To Answer (10/09/2022)  Utilities: Patient Unable To Answer (10/09/2022)  Alcohol Screen: Low Risk  (11/05/2021)  Depression (PHQ2-9): High Risk (11/05/2021)  Financial Resource Strain: Low Risk  (11/05/2021)  Physical Activity: Insufficiently Active (11/05/2021)  Social Connections: Moderately Integrated (11/05/2021)  Stress: No Stress Concern Present (11/05/2021)  Tobacco Use: Medium Risk (10/09/2022)     Readmission Risk Interventions    10/10/2022    4:39 PM  Readmission Risk Prevention Plan  Transportation Screening Complete  PCP or Specialist Appt within 3-5 Days Complete  HRI or Home Care Consult Complete  Social Work Consult for Recovery Care Planning/Counseling Complete  Palliative Care  Screening Not Applicable  Medication Review [employer]) Complete

## 2022-10-12 NOTE — Plan of Care (Signed)
  Problem: Pain Management: Goal: Pain level will decrease Outcome: Progressing   Problem: Clinical Measurements: Goal: Ability to maintain clinical measurements within normal limits will improve Outcome: Progressing Goal: Will remain free from infection Outcome: Progressing Goal: Diagnostic test results will improve Outcome: Progressing Goal: Respiratory complications will improve Outcome: Progressing Goal: Cardiovascular complication will be avoided Outcome: Progressing   Problem: Activity: Goal: Risk for activity intolerance will decrease Outcome: Progressing   Problem: Nutrition: Goal: Adequate nutrition will be maintained Outcome: Progressing   Problem: Coping: Goal: Level of anxiety will decrease Outcome: Progressing   Problem: Elimination: Goal: Will not experience complications related to bowel motility Outcome: Progressing   Problem: Pain Managment: Goal: General experience of comfort will improve Outcome: Progressing   Problem: Safety: Goal: Ability to remain free from injury will improve Outcome: Progressing   Problem: Skin Integrity: Goal: Risk for impaired skin integrity will decrease Outcome: Progressing   Problem: Fluid Volume: Goal: Hemodynamic stability will improve Outcome: Progressing   Problem: Clinical Measurements: Goal: Diagnostic test results will improve Outcome: Progressing Goal: Signs and symptoms of infection will decrease Outcome: Progressing   Problem: Respiratory: Goal: Ability to maintain adequate ventilation will improve Outcome: Progressing   Problem: Coping: Goal: Ability to adjust to condition or change in health will improve Outcome: Progressing   Problem: Fluid Volume: Goal: Ability to maintain a balanced intake and output will improve Outcome: Progressing   Problem: Metabolic: Goal: Ability to maintain appropriate glucose levels will improve Outcome: Progressing   Problem: Nutritional: Goal: Maintenance of  adequate nutrition will improve Outcome: Progressing Goal: Progress toward achieving an optimal weight will improve Outcome: Progressing   Problem: Skin Integrity: Goal: Risk for impaired skin integrity will decrease Outcome: Progressing   Problem: Tissue Perfusion: Goal: Adequacy of tissue perfusion will improve Outcome: Progressing

## 2022-10-12 NOTE — Progress Notes (Addendum)
Patient discharged home today, transported home by EMS. Discharge paperwork went over with patients spouse [name] [name], who verbalized understanding. Belongings sent home with patient. Noted patients urine output tea colored. MD Gwenlyn Perking made aware. No new orders. CBG checked prior to patient leaving by EMS, per request was 87. Patients spouse made aware.

## 2022-10-12 NOTE — Discharge Summary (Signed)
Physician Discharge Summary   Patient: [name] [name] MRN: [id number] DOB: 1940/10/25  Admit date:     10/09/2022  Discharge date: 10/12/22  Discharge Physician: Vassie Loll   PCP: Donita Brooks, MD   Recommendations at discharge:  Repeat basic metabolic panel to follow electrolytes and renal function Repeat CBC to follow hemoglobin trend Make sure patient has follow-up with urology service as instructed for further evaluation and management of urinary retention/BPH with urinary bladder obstruction. Continue to follow CBGs fluctuation/A1c with further adjustment to hypoglycemia regimen as needed.  Discharge Diagnoses: Principal Problem:   Sepsis (HCC) Active Problems:   Diabetes mellitus (HCC)   Hypertension   Carotid disease, bilateral (HCC)   Dementia (HCC)   Bladder outlet obstruction   Anemia of chronic disease   Essential hypertension   Acute cystitis with hematuria  Brief Narrative:  [name] [name]  is a 82 y.o. male,  with medical history significant of hypertension, hyperlipidemia, T2DM, dementia, anxiety , with recent hospitalization in August for right hip fracture status post ORIF, and another hospitalization earlier this month for AKI secondary to bladder outlet obstruction.  He was discharged on Foley catheter. -Patient was brought by his wife for altered mental status, reports she noted change in the color and the Foley catheter while urine is darker for the past 2 days, as well as reports decreased oral intake, poor appetite, but he was able to drink fluids, reports she has a follow-up with urology on 10/19/2022, patient himself is very poor historian cannot provide any reliable history, wife reports he had no fever, no chills at home.  He was admitted for sepsis, POA, secondary to UTI in the setting of Foley catheter.  He is now having difficulty with urinary retention and urology, Dr. Lafonda Mosses, consulted for assistance in management.    Assessment and Plan: Sepsis,  POA, in secondary to Proteus mirabilis UTI, Foley related -Present on admission, tachypneic, tachycardic, elevated lactic acid 3.9, with altered mental status -Related to Foley catheter, discussed with staff, this is to be exchanged in ED. -Blood cultures without any growth. -Sepsis features resolved at discharge. -Received IV cefepime in ED, then patient was kept on IV Rocephin 2 g during hospitalization. -Urine culture demonstrated microorganism resistant to nitrofurantoin; otherwise pansensitive. -Planning to discharge home with 6 more days of oral antibiotics and outpatient follow-up with urology service. -Patient and family advised to maintain adequate hydration.   Urinary retention, indwelling Foley catheter -with recent hospitalization noted for urinary obstruction, discharged with Foley catheter, recommendation for outpatient voiding trial, wife report follow-up with urologist 10/19/2022 -Continue treatment with with Flomax. -Foley catheter has been replaced over wire by urology service -Currently flowing/functioning adequately. -Outpatient follow-up with urology has been recommended. -Continue antibiotic therapy as mentioned above.   Essential hypertension-stable -Not on any antihypertensive medications at home -Continue to follow vital signs. -Antihypertensive agents to be initiated as require based on blood pressure fluctuation.   mixed hyperlipidemia -Continue statin.   Type 2 diabetes mellitus with hyperglycemia/hypoglycemia -Resume home hypoglycemic regimen. -Continue to follow CBG fluctuation and adjust hypoglycemic regimen as required.   Physical deconditioning Failure to thrive PCM Dementia -PT recommending home health -Continue Aricept and Namenda -Continue feeding supplements and supportive care..   recent right hip surgery -Continue with PT/OT, he remains on Eliquis for postoperative DVT prophylaxis. -Outpatient follow-up with orthopedic service as  previously scheduled. -Planning for resumption of home health PT at discharge.   Consultants: Neurology service Procedures performed: See below for x-ray  reports. Disposition: Home with home health services. Diet recommendation: Heart healthy.  DISCHARGE MEDICATION: Allergies as of 10/12/2022       Reactions   Invokana [canagliflozin] Palpitations, Rash, Other (See Comments)   Rash, tachycardia,weight loss   Plavix [clopidogrel Bisulfate]    Upset stomach        Medication List     STOP taking these medications    clotrimazole 1 % cream Commonly known as: Lotrimin AF       TAKE these medications    acetaminophen 325 MG tablet Commonly known as: TYLENOL Take 2 tablets (650 mg total) by mouth every 8 (eight) hours as needed for mild pain or moderate pain.   B-D ULTRAFINE III SHORT PEN 31G X 8 MM Misc Generic drug: Insulin Pen Needle USE TO INJECT INSULIN EVERY DAY   cefadroxil 500 MG capsule Commonly known as: DURICEF Take 1 capsule (500 mg total) by mouth 2 (two) times daily for 6 days.   Dexcom G7 Sensor Misc Inject 1 Application into the skin as directed. Change sensor every 10 days as directed.   Dialyvite Vitamin D 5000 125 MCG (5000 UT) capsule Generic drug: Cholecalciferol Take 5,000 Units by mouth daily.   donepezil 10 MG tablet Commonly known as: ARICEPT TAKE 1 TABLET(10 MG) BY MOUTH AT BEDTIME What changed: See the new instructions.   Eliquis 2.5 MG Tabs tablet Generic drug: apixaban Take 1 tablet (2.5 mg total) by mouth 2 (two) times daily.   feeding supplement Liqd Take 1 Container by mouth 2 (two) times daily between meals.   HYDROcodone-acetaminophen 5-325 MG tablet Commonly known as: Norco Take 1 tablet by mouth every 6 (six) hours as needed for moderate pain. Ok to fill after 9/10   Lantus SoloStar 100 UNIT/ML Solostar Pen Generic drug: insulin glargine Inject 8 Units into the skin daily.   memantine 10 MG tablet Commonly known as:  NAMENDA TAKE 1 TABLET(10 MG) BY MOUTH TWICE DAILY What changed: See the new instructions.   nystatin powder Commonly known as: MYCOSTATIN/NYSTOP Apply topically 2 (two) times daily. Apply to groin area and scrotum   OneTouch Delica Plus Lancet33G Misc USE TO CHECK BLOOD SUGAR TWICE DAILY. Dx: E11.9.   OneTouch Verio test strip Generic drug: glucose blood USE TO TEST BLOOD SUGAR TWO TO THREE TIMES DAILY   OneTouch Verio w/Device Kit USE TO TEST BLOOD SUGAR LEVELS THREE TIMES DAILY   pioglitazone 45 MG tablet Commonly known as: ACTOS Take 1 tablet (45 mg total) by mouth daily.   rosuvastatin 40 MG tablet Commonly known as: CRESTOR TAKE 1 TABLET(40 MG) BY MOUTH DAILY What changed: See the new instructions.   tamsulosin 0.4 MG Caps capsule Commonly known as: FLOMAX Take 1 capsule (0.4 mg total) by mouth daily after supper.               Discharge Care Instructions  (From admission, onward)           Start     Ordered   10/12/22 0000  Discharge wound care:       Comments: Keep area clean and dry Constant repositioning Continue using as needed barrier preventive OTC cream (zinc oxide).   10/12/22 1226            Follow-up Information     [facility], [facility] Follow up.   Specialty: Home Health Services Why: agency will provide HHPT services. Contact information: [address] [place] [address] [phone]  Donita Brooks, MD. Schedule an appointment as soon as possible for a visit in 2 week(s).   Specialty: Family Medicine Contact information: [address] [place] [address] [phone]                Discharge Exam: Ceasar Mons Weights   10/09/22 1321 10/09/22 1908  Weight: 63 kg 55.9 kg   General exam: No fever, no nausea, no vomiting, good functioning Foley catheter.  Following commands appropriately. Respiratory system: Good saturation on room air; no using accessory  muscles. Cardiovascular system:RRR.  No rubs, no gallops, no JVD. Gastrointestinal system: Abdomen is nondistended, soft and nontender.  Bowel sounds appreciated. GU: Foley catheter in place; urine adequate flow; mild blood tinge urine appreciated. Central nervous system: Limited examination secondary to underlying dementia.  No focal neurological deficits. Extremities: No cyanosis or clubbing. Skin: No petechiae.  Stage II sacral injury present at time of admission without signs of superimposed infection. Psychiatry: Judgement and insight appear impaired secondary to underlying dementia; flat affect appreciated.  Condition at discharge: Stable and improved.  The results of significant diagnostics from this hospitalization (including imaging, microbiology, ancillary and laboratory) are listed below for reference.   Imaging Studies: CT ABDOMEN PELVIS W CONTRAST  Result Date: 10/09/2022 CLINICAL DATA:  Abdominal pain EXAM: CT ABDOMEN AND PELVIS WITH CONTRAST TECHNIQUE: Multidetector CT imaging of the abdomen and pelvis was performed using the standard protocol following bolus administration of intravenous contrast. RADIATION DOSE REDUCTION: This exam was performed according to the departmental dose-optimization program which includes automated exposure control, adjustment of the mA and/or kV according to patient size and/or use of iterative reconstruction technique. CONTRAST:  OMNIPAQUE IOHEXOL 300 MG/ML  SOLN COMPARISON:  09/13/2022 FINDINGS: Lower chest: Descending thoracic aortic and coronary atherosclerosis. Hepatobiliary: Porcelain gallbladder. Punctate calcifications in the liver compatible with old granulomatous disease. Pancreas: Unremarkable Spleen: Unremarkable Adrenals/Urinary Tract: Foley catheter in the urinary bladder along with gas and a small amount of surrounding fluid in the urinary bladder. Substantially thick-walled urinary bladder diffusely. 1.3 cm Bosniak category 1 cyst in  the left mid kidney posteriorly on image 27 series 2. No further imaging workup of this lesion is indicated. Adrenal glands appear unremarkable. Stomach/Bowel: Unremarkable Vascular/Lymphatic: Atherosclerosis is present, including aortoiliac atherosclerotic disease. No pathologic adenopathy. Reproductive: Unremarkable Other: Trace ascites tracking along both paracolic gutters. Mildly increased presacral edema, nonspecific. Musculoskeletal: Right hip arthroplasty. Bony demineralization. Old deformity of the right pubic rami. Lower lumbar spondylosis and degenerative disc disease. IMPRESSION: 1. Substantially thick-walled urinary bladder, correlate with urinalysis to exclude cystitis. A Foley catheter is in place. 2. Trace ascites tracking along both paracolic gutters. 3. Aortic atherosclerosis. 4. Porcelain gallbladder. This is normally an indication for cholecystectomy given the risk of malignant degeneration, but correlation with patient's clinical scenario is recommended in determining follow up surgical referral. Aortic Atherosclerosis (ICD10-I70.0). Electronically Signed   By: Gaylyn Rong M.D.   On: 10/09/2022 17:25   DG Chest Portable 1 View  Result Date: 10/09/2022 CLINICAL DATA:  Altered mental status EXAM: PORTABLE CHEST 1 VIEW COMPARISON:  None Available. FINDINGS: Normal mediastinum and cardiac silhouette. Normal pulmonary vasculature. No evidence of effusion, infiltrate, or pneumothorax. No acute bony abnormality. IMPRESSION: No acute cardiopulmonary process. Electronically Signed   By: Genevive Bi M.D.   On: 10/09/2022 15:47   DG Knee 1-2 Views Right  Result Date: 09/21/2022 CLINICAL DATA:  [id number] Knee pain, right [id number] EXAM: RIGHT KNEE - 1-2 VIEW COMPARISON:  08/31/2022 FINDINGS:  No fracture or dislocation. No definite effusion. Osteopenia. Scattered arterial calcifications. IMPRESSION: No acute findings. Osteopenia. Electronically Signed   By: Corlis Leak M.D.   On: 09/21/2022 14:12    CT ABDOMEN PELVIS W CONTRAST  Result Date: 09/13/2022 CLINICAL DATA:  Increasing abdominal pain and distension EXAM: CT ABDOMEN AND PELVIS WITH CONTRAST TECHNIQUE: Multidetector CT imaging of the abdomen and pelvis was performed using the standard protocol following bolus administration of intravenous contrast. RADIATION DOSE REDUCTION: This exam was performed according to the departmental dose-optimization program which includes automated exposure control, adjustment of the mA and/or kV according to patient size and/or use of iterative reconstruction technique. CONTRAST:  80mL OMNIPAQUE IOHEXOL 300 MG/ML  SOLN COMPARISON:  None Available. FINDINGS: Lower chest: No acute pleural or parenchymal lung disease. Hepatobiliary: Calcified gallbladder wall consistent with porcelain gallbladder. No evidence of acute cholecystitis. The liver is unremarkable. Pancreas: Unremarkable. No pancreatic ductal dilatation or surrounding inflammatory changes. Spleen: Normal in size without focal abnormality. Adrenals/Urinary Tract: No acute renal abnormalities. No urinary tract calculi or obstructive uropathy. The adrenals are unremarkable. The bladder is decompressed with a Foley catheter, with bladder wall thickening and trabeculation suggesting chronic bladder outlet obstruction. Stomach/Bowel: No bowel obstruction or ileus. No bowel wall thickening or inflammatory change. Vascular/Lymphatic: Aortic atherosclerosis. No enlarged abdominal or pelvic lymph nodes. Reproductive: Prostate is unremarkable. Other: No free fluid or free intraperitoneal gas. No abdominal wall hernia. Musculoskeletal: No acute or destructive bony abnormalities. Postsurgical changes from recent right hip hemiarthroplasty, without evidence of complication. Soft tissue swelling overlying the right hip consistent with recent surgical intervention. Reconstructed images demonstrate no additional findings. IMPRESSION: 1. Porcelain gallbladder.  No evidence of  acute cholecystitis. 2. No bowel obstruction or ileus. 3. Bladder wall thickening with trabeculations, consistent with chronic bladder outlet obstruction. The bladder is decompressed with an indwelling Foley catheter. 4.  Aortic Atherosclerosis (ICD10-I70.0). 5. Postsurgical changes from recent right hip hemiarthroplasty. No evidence of acute complication. Electronically Signed   By: Sharlet Salina M.D.   On: 09/13/2022 23:10   CT Head Wo Contrast  Result Date: 09/13/2022 CLINICAL DATA:  Altered level of consciousness, abdominal pain EXAM: CT HEAD WITHOUT CONTRAST TECHNIQUE: Contiguous axial images were obtained from the base of the skull through the vertex without intravenous contrast. RADIATION DOSE REDUCTION: This exam was performed according to the departmental dose-optimization program which includes automated exposure control, adjustment of the mA and/or kV according to patient size and/or use of iterative reconstruction technique. COMPARISON:  04/11/2017 FINDINGS: Brain: Chronic ischemic changes are seen within the periventricular white matter and left basal ganglia. No evidence of acute infarct or hemorrhage. Lateral ventricles and remaining midline structures are unremarkable. No acute extra-axial fluid collections. No mass effect. Vascular: No hyperdense vessel or unexpected calcification. Skull: Normal. Negative for fracture or focal lesion. Sinuses/Orbits: No acute finding. Other: None. IMPRESSION: 1. Chronic ischemic changes as above. No acute intracranial process. Electronically Signed   By: Sharlet Salina M.D.   On: 09/13/2022 23:03   DG Chest Port 1 View  Result Date: 09/13/2022 CLINICAL DATA:  Mental status changes EXAM: PORTABLE CHEST 1 VIEW COMPARISON:  08/30/2022 FINDINGS: Heart and mediastinal contours are within normal limits. No focal opacities or effusions. No acute bony abnormality. Aortic atherosclerosis. IMPRESSION: No active cardiopulmonary disease. Electronically Signed   By: Charlett Nose M.D.   On: 09/13/2022 22:20    Microbiology: Results for orders placed or performed during the hospital encounter of 10/09/22  Remove and replace urinary  cath (placed > 5 days) then obtain urine culture from new indwelling urinary catheter.     Status: Abnormal   Collection Time: 10/09/22  2:26 PM   Specimen: Urine, Catheterized  Result Value Ref Range Status   Specimen Description   Final    URINE, CATHETERIZED Performed at [facility], [address] [place], [place] [address]    Special Requests   Final    NONE Performed at [facility], [address] [place], [place] [address]    Culture >=100,000 COLONIES/mL PROTEUS MIRABILIS (A)  Final   Report Status 10/11/2022 FINAL  Final   Organism ID, Bacteria PROTEUS MIRABILIS (A)  Final      Susceptibility   Proteus mirabilis - MIC*    AMPICILLIN <=2 SENSITIVE Sensitive     CEFAZOLIN <=4 SENSITIVE Sensitive     CEFEPIME <=0.12 SENSITIVE Sensitive     CEFTRIAXONE <=0.25 SENSITIVE Sensitive     CIPROFLOXACIN <=0.25 SENSITIVE Sensitive     GENTAMICIN <=1 SENSITIVE Sensitive     IMIPENEM 4 SENSITIVE Sensitive     NITROFURANTOIN 128 RESISTANT Resistant     TRIMETH/SULFA <=20 SENSITIVE Sensitive     AMPICILLIN/SULBACTAM <=2 SENSITIVE Sensitive     PIP/TAZO <=4 SENSITIVE Sensitive     * >=100,000 COLONIES/mL PROTEUS MIRABILIS  Blood Culture (routine x 2)     Status: None (Preliminary result)   Collection Time: 10/09/22  2:47 PM   Specimen: BLOOD  Result Value Ref Range Status   Specimen Description BLOOD BLOOD RIGHT FOREARM  Final   Special Requests   Final    BOTTLES DRAWN AEROBIC AND ANAEROBIC Blood Culture adequate volume   Culture   Final    NO GROWTH 3 DAYS Performed at [facility], [address] [place], [place] [address]    Report Status PENDING  Incomplete  Blood Culture (routine x 2)     Status: None (Preliminary result)   Collection Time: 10/09/22  3:25 PM   Specimen: BLOOD  Result Value Ref Range Status    Specimen Description BLOOD LEFT ANTECUBITAL  Final   Special Requests   Final    BOTTLES DRAWN AEROBIC AND ANAEROBIC Blood Culture results may not be optimal due to an excessive volume of blood received in culture bottles   Culture   Final    NO GROWTH 3 DAYS Performed at [facility], [address], [place], [place] [address]    Report Status PENDING  Incomplete    Labs: CBC: Recent Labs  Lab 10/09/22 1416 10/10/22 0413 10/11/22 0656  WBC 6.7 9.4 10.9*  NEUTROABS 4.9  --   --   HGB 13.9 10.5* 10.3*  HCT 43.2 34.0* 32.9*  MCV 95.2 97.7 95.4  PLT 402* 368 365   Basic Metabolic Panel: Recent Labs  Lab 10/09/22 1416 10/10/22 0413 10/10/22 1142 10/11/22 0656  NA 135 137  --  134*  K 4.4 3.7  --  3.2*  CL 97* 100  --  97*  CO2 25 28  --  29  GLUCOSE 391* 163* 459* 188*  BUN 24* 23  --  24*  CREATININE 1.02 0.88  --  1.10  CALCIUM 9.3 8.9  --  9.1  MG  --   --   --  1.8   Liver Function Tests: Recent Labs  Lab 10/09/22 1416  AST 17  ALT 16  ALKPHOS 87  BILITOT 0.6  PROT 6.8  ALBUMIN 3.1*   CBG: Recent Labs  Lab 10/11/22 1611  10/11/22 1906 10/11/22 2016 10/12/22 0749 10/12/22 1132  GLUCAP 199* 320* 304* 146* 212*    Discharge time spent: greater than 30 minutes.  Signed: Vassie Loll, MD [facility] 10/12/2022

## 2022-10-12 NOTE — Consult Note (Signed)
[facility] Customer service manager [facility]) Accountable Care Organization (ACO) [facility] Liaison Note  10/12/2022  [name] [name] October 17, 1940 [id number]  Location: [facility] RN Hospital Liaison screened the patient remotely at [facility].  Insurance: [employer]   [name] [name] is a 82 y.o. male who is a Primary Care Patient of [name], [name], MD [facility] Family Medicine. The patient was screened for 30 day readmission hospitalization with noted high risk score for unplanned readmission risk with 3 IP in 6 months.  The patient was assessed for potential [facility]) Care Management service needs for post hospital transition for care coordination. Review of patient's electronic medical record reveals patient was admitted with Sepsis. Liaison spoke with the [facility] Spouse [name] concerning care management services as pt ws sleeping at the time of this call. Spouse familiar as she has received call in the past from [facility]. Liaison re-educated on care management services and offered a nurse care coordinator to complete a post hospital prevention readmission outreach for care management services (spouse receptive). Spouse indicated [facility] will see pt for ongoing therapy and she has spoke with this agency already.   Plan: [facility] Liaison will continue to follow progress and disposition to asess for post hospital community care coordination/management needs.  Referral request for community care coordination: Will make a referral for a nurse care coordinator to intervene with care management services.   [facility] Care Management/Population Health does not replace or interfere with any arrangements made by the Inpatient Transition of Care team.   For questions contact:   Elliot Cousin, RN, [facility] Liaison [place]   Population Health Office Hours MTWF  8:00 am-6:00 pm [phone] mobile [phone] [Office toll free line] Office Hours are M-F 8:30 - 5  pm [email or web address]

## 2022-10-12 NOTE — Care Management Important Message (Addendum)
Important Message  Patient Details  Name: [name] [name] MRN: [id number] Date of Birth: 10/08/1940   Important Message Given:  Yes - Medicare IM   Spoke with spouse Mrs. Tasia Catchings at [phone]  Corey Harold 10/12/2022, 12:30 PM

## 2022-10-12 NOTE — Inpatient Diabetes Management (Signed)
Inpatient Diabetes Program Recommendations  AACE/ADA: New Consensus Statement on Inpatient Glycemic Control   Target Ranges:  Prepandial:   less than 140 mg/dL      Peak postprandial:   less than 180 mg/dL (1-2 hours)      Critically ill patients:  140 - 180 mg/dL    Latest Reference Range & Units 10/11/22 07:40 10/11/22 11:33 10/11/22 16:11 10/11/22 19:06 10/11/22 20:16 10/12/22 07:49  Glucose-Capillary 70 - 99 mg/dL 161 (H) 096 (H) 045 (H) 320 (H) 304 (H) 146 (H)   Review of Glycemic Control  Diabetes history: DM2 Outpatient Diabetes medications: Actos 45 mg daily, Lantus 8 units daily Current orders for Inpatient glycemic control: Semglee 8 units daily, Novolog 0-15 units TID with meals, Novolog 0-5 units QHS  Inpatient Diabetes Program Recommendations:    Insulin: If patient is eating well, please consider ordering Novolog 2 units TID with meals for meal coverage if patient eats at least 50% of meals.  Thanks, Orlando Penner, RN, MSN, CDCES Diabetes Coordinator Inpatient Diabetes Program [phone] (Team Pager from 8am to 5pm)

## 2022-10-12 NOTE — Progress Notes (Signed)
Mobility Specialist Progress Note:    10/12/22 1405  Mobility  Activity Refused mobility   Pt refused mobility despite max encouragement. All needs met.   Lawerance Bach Mobility Specialist Please contact via Special educational needs teacher or  Rehab office at [phone]

## 2022-10-13 ENCOUNTER — Telehealth: Payer: Self-pay

## 2022-10-13 ENCOUNTER — Telehealth: Payer: Self-pay | Admitting: *Deleted

## 2022-10-13 NOTE — Telephone Encounter (Signed)
Angela RN with [facility] called in to get VO for this pt. Pt being d/c'd from hospital.  Please add nursing to pt's ROC.   Please call Lawanna Kobus RN with [facility] on secure line at [phone]

## 2022-10-13 NOTE — Progress Notes (Signed)
[facility] Quality Team Note  Name: [name] [name] Date of Birth: November 13, 1940 MRN: [id number] Date: 10/13/2022  [facility] Quality Team has reviewed this patient's chart, please see recommendations below:  [facility] Quality Other; (MEDICATION RECONCILIATION POST DISCHARGE. PATIENT RECENTLY DISCHARGED FROM HOSPITAL ON 10/12/2022. PATIENT NEEDS MEDICATION RECONCILIATION BY A PRESCRIBING PROVIDER BEFORE 11/10/2022 FOR GAP CLOSURE.)

## 2022-10-13 NOTE — Progress Notes (Unsigned)
  Care Coordination  Outreach Note  10/13/2022 Name: [name] [name] MRN: [id number] DOB: 1940-06-05   Care Coordination Outreach Attempts: An unsuccessful telephone outreach was attempted today to offer the patient information about available care coordination services.  Follow Up Plan:  Additional outreach attempts will be made to offer the patient care coordination information and services.   Encounter Outcome:  No Answer  Gwenevere Ghazi  Care Coordination Care Guide  Direct Dial: [phone]

## 2022-10-13 NOTE — Telephone Encounter (Signed)
Pt's wife called in to ask if pt could get this med Rx #: [id number] apixaban (ELIQUIS) 2.5 MG TABS tablet [[id number]]  ENDED refilled. Pt was recently d/c'd from hospital and was prescribed this med. Pt's wife also wanted to inquire about a prescription for bed pads for pt. Pt's wife stated that this request was made some time ago and they have yet to hear back about this. Pt's wife would like a cb from nurse please.Please advise.  LOV:06/25/22  PHARMACY: [id number] - [place], [place] - [address], [place] [address] Phone: [phone]  Fax: [phone] DEA #: [id number]  CB#: [phone]

## 2022-10-14 ENCOUNTER — Other Ambulatory Visit: Payer: Self-pay

## 2022-10-14 DIAGNOSIS — I6523 Occlusion and stenosis of bilateral carotid arteries: Secondary | ICD-10-CM

## 2022-10-14 DIAGNOSIS — I639 Cerebral infarction, unspecified: Secondary | ICD-10-CM

## 2022-10-14 DIAGNOSIS — F01B11 Vascular dementia, moderate, with agitation: Secondary | ICD-10-CM

## 2022-10-14 LAB — CULTURE, BLOOD (ROUTINE X 2): Special Requests: ADEQUATE

## 2022-10-14 MED ORDER — APIXABAN 2.5 MG PO TABS: 2.5000 mg | ORAL_TABLET | Freq: Two times a day (BID) | ORAL | 0 refills | Status: DC

## 2022-10-14 NOTE — Progress Notes (Signed)
  Care Coordination   Note   10/14/2022 Name: [name] [name] MRN: [id number] DOB: 1940/03/16  [name] [name] is a 82 y.o. year old male who sees [name], [name], MD for primary care. I reached out to [name] by phone today to offer care coordination services.  [name] was given information about Care Coordination services today including:   The Care Coordination services include support from the care team which includes your Nurse Coordinator, Clinical Social Worker, or Pharmacist.  The Care Coordination team is here to help remove barriers to the health concerns and goals most important to you. Care Coordination services are voluntary, and the patient may decline or stop services at any time by request to their care team member.   Care Coordination Consent Status: Patient agreed to services and verbal consent obtained.   Follow up plan:  Telephone appointment with care coordination team member scheduled for:  10/21/22  Encounter Outcome:  Patient Scheduled  [facility] Coordination Care Guide  Direct Dial: [phone]

## 2022-10-15 ENCOUNTER — Other Ambulatory Visit: Payer: Self-pay | Admitting: Family Medicine

## 2022-10-15 DIAGNOSIS — R32 Unspecified urinary incontinence: Secondary | ICD-10-CM | POA: Insufficient documentation

## 2022-10-15 DIAGNOSIS — I6523 Occlusion and stenosis of bilateral carotid arteries: Secondary | ICD-10-CM

## 2022-10-15 DIAGNOSIS — I639 Cerebral infarction, unspecified: Secondary | ICD-10-CM

## 2022-10-15 DIAGNOSIS — F01B11 Vascular dementia, moderate, with agitation: Secondary | ICD-10-CM

## 2022-10-15 MED ORDER — APIXABAN 2.5 MG PO TABS
2.5000 mg | ORAL_TABLET | Freq: Two times a day (BID) | ORAL | 5 refills | Status: DC
Start: 2022-10-15 — End: 2022-11-04

## 2022-10-19 ENCOUNTER — Ambulatory Visit (INDEPENDENT_AMBULATORY_CARE_PROVIDER_SITE_OTHER): Payer: PPO | Admitting: Urology

## 2022-10-19 VITALS — BP 169/77 | HR 102

## 2022-10-19 DIAGNOSIS — E08649 Diabetes mellitus due to underlying condition with hypoglycemia without coma: Secondary | ICD-10-CM | POA: Diagnosis not present

## 2022-10-19 DIAGNOSIS — N401 Enlarged prostate with lower urinary tract symptoms: Secondary | ICD-10-CM | POA: Diagnosis not present

## 2022-10-19 DIAGNOSIS — N281 Cyst of kidney, acquired: Secondary | ICD-10-CM | POA: Diagnosis not present

## 2022-10-19 DIAGNOSIS — R339 Retention of urine, unspecified: Secondary | ICD-10-CM | POA: Diagnosis not present

## 2022-10-19 MED ORDER — FINASTERIDE 5 MG PO TABS: 5.0000 mg | ORAL_TABLET | Freq: Every day | ORAL | 3 refills | Status: DC
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
## 2022-10-20 ENCOUNTER — Telehealth: Payer: Self-pay

## 2022-10-20 ENCOUNTER — Inpatient Hospital Stay: Payer: PPO | Admitting: Family Medicine

## 2022-10-20 ENCOUNTER — Ambulatory Visit: Payer: PPO | Admitting: Nurse Practitioner

## 2022-10-20 DIAGNOSIS — E782 Mixed hyperlipidemia: Secondary | ICD-10-CM

## 2022-10-20 DIAGNOSIS — Z7984 Long term (current) use of oral hypoglycemic drugs: Secondary | ICD-10-CM

## 2022-10-20 DIAGNOSIS — Z794 Long term (current) use of insulin: Secondary | ICD-10-CM

## 2022-10-20 NOTE — Telephone Encounter (Signed)
Message from Dr. Tanya Nones to nurse: This patient needs to be seen in office to repeat labs and urine studies after hospital. This cannot be done over the phone.  Called pt's wife Rene Kocher, appointment changed to in office visit for 10/26/2022 @ 12 pm for 30 minute slot. Mjp,lpn

## 2022-10-21 ENCOUNTER — Telehealth: Payer: Self-pay | Admitting: Family Medicine

## 2022-10-21 ENCOUNTER — Other Ambulatory Visit: Payer: Self-pay | Admitting: Family Medicine

## 2022-10-21 ENCOUNTER — Ambulatory Visit: Payer: Self-pay | Admitting: *Deleted

## 2022-10-21 MED ORDER — HYDROCODONE-ACETAMINOPHEN 5-325 MG PO TABS: 1.0000 | ORAL_TABLET | Freq: Four times a day (QID) | ORAL | 0 refills | Status: DC | PRN

## 2022-10-21 NOTE — Telephone Encounter (Signed)
Prescription Request  10/21/2022  LOV: 06/25/2022  What is the name of the medication or equipment?   HYDROcodone-acetaminophen (NORCO) 5-325 MG tablet  **took last pill this morning**  Have you contacted your pharmacy to request a refill? Yes   Which pharmacy would you like this sent to?  [id number] - [place], [place] - [name] [place] [address] Phone: [phone] Fax: [phone]    Patient notified that their request is being sent to the clinical staff for review and that they should receive a response within 2 business days.   Please advise patient at [phone].

## 2022-10-21 NOTE — Patient Outreach (Signed)
  Care Coordination   Initial Visit Note   11/06/2022 Name: [name] [name] MRN: [id number] DOB: 1940-05-11  [name] [name] is a 82 y.o. year old male who sees [name], [name], MD for primary care. I spoke with  [name] [name] by phone today.  What matters to the patients health and wellness today?  Post surgery, Dementia, transportation No appt Tuesday  son disabled hearing [facility]   Goals Addressed             This Visit's Progress    Obtain transportation to appointments- care coordination services   Not on track    Interventions Today    Flowsheet Row Most Recent Value  Chronic Disease   Chronic disease during today's visit Diabetes, Other  [transportation, Memory concerns [facility], hearing]  General Interventions   General Interventions Discussed/Reviewed General Interventions Discussed, Communication with  Communication with PCP/Specialists  [[facility] staff]  Exercise Interventions   Exercise Discussed/Reviewed Exercise Discussed, Physical Activity, Assistive device use and maintanence  Physical Activity Discussed/Reviewed Physical Activity Discussed, Home Exercise Program (HEP)  Education Interventions   Education Provided Provided Education  Provided Verbal Education On Community Resources  [[facility]]  Mental Health Interventions   Mental Health Discussed/Reviewed Mental Health Discussed  Pharmacy Interventions   Pharmacy Dicussed/Reviewed Pharmacy Topics Discussed  Safety Interventions   Safety Discussed/Reviewed Safety Discussed  Advanced Directive Interventions   Advanced Directives Discussed/Reviewed Advanced Directives Discussed              SDOH assessments and interventions completed:  Yes     Care Coordination Interventions:  Yes, provided   Follow up plan: Follow up call scheduled for 11/06/22    Encounter Outcome:  Patient Visit Completed   Cala Bradford L. Noelle Penner, RN, BSN, [facility]  [facility] Care Management Coordinator  [phone]  Fax: [phone]

## 2022-10-22 DIAGNOSIS — L89322 Pressure ulcer of left buttock, stage 2: Secondary | ICD-10-CM | POA: Diagnosis not present

## 2022-10-26 ENCOUNTER — Inpatient Hospital Stay: Payer: PPO | Admitting: Family Medicine

## 2022-10-27 ENCOUNTER — Inpatient Hospital Stay: Payer: PPO | Admitting: Family Medicine

## 2022-10-28 DIAGNOSIS — R262 Difficulty in walking, not elsewhere classified: Secondary | ICD-10-CM | POA: Insufficient documentation
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
## 2022-10-29 ENCOUNTER — Telehealth: Payer: Self-pay

## 2022-10-29 NOTE — Telephone Encounter (Signed)
Bradly Chris RN with [facility] called in to get an order for pt to get "medahoney" to go underneath dressing. Please advise  Cb#: Bradly Chris RN with [facility] at [phone]

## 2022-10-30 ENCOUNTER — Ambulatory Visit: Payer: PPO | Admitting: Family Medicine

## 2022-10-30 ENCOUNTER — Inpatient Hospital Stay (HOSPITAL_COMMUNITY)
Admission: EM | Admit: 2022-10-30 | Discharge: 2022-11-04 | DRG: 872 | Disposition: A | Discharge status: Home Health | Payer: PPO | Attending: Internal Medicine | Admitting: Internal Medicine

## 2022-10-30 ENCOUNTER — Other Ambulatory Visit: Payer: Self-pay

## 2022-10-30 ENCOUNTER — Emergency Department (HOSPITAL_COMMUNITY): Payer: PPO

## 2022-10-30 VITALS — BP 130/64 | HR 148 | Ht 71.0 in | Wt 123.0 lb

## 2022-10-30 DIAGNOSIS — M159 Polyosteoarthritis, unspecified: Secondary | ICD-10-CM | POA: Diagnosis present

## 2022-10-30 DIAGNOSIS — Z9889 Other specified postprocedural states: Secondary | ICD-10-CM

## 2022-10-30 DIAGNOSIS — Z85828 Personal history of other malignant neoplasm of skin: Secondary | ICD-10-CM | POA: Diagnosis not present

## 2022-10-30 DIAGNOSIS — Z79899 Other long term (current) drug therapy: Secondary | ICD-10-CM

## 2022-10-30 DIAGNOSIS — Z833 Family history of diabetes mellitus: Secondary | ICD-10-CM | POA: Diagnosis not present

## 2022-10-30 DIAGNOSIS — Z7901 Long term (current) use of anticoagulants: Secondary | ICD-10-CM | POA: Diagnosis not present

## 2022-10-30 DIAGNOSIS — Z681 Body mass index (BMI) 19 or less, adult: Secondary | ICD-10-CM

## 2022-10-30 DIAGNOSIS — Z978 Presence of other specified devices: Secondary | ICD-10-CM | POA: Diagnosis not present

## 2022-10-30 DIAGNOSIS — Z96641 Presence of right artificial hip joint: Secondary | ICD-10-CM | POA: Diagnosis present

## 2022-10-30 DIAGNOSIS — E872 Acidosis, unspecified: Secondary | ICD-10-CM | POA: Diagnosis not present

## 2022-10-30 DIAGNOSIS — E860 Dehydration: Principal | ICD-10-CM | POA: Diagnosis present

## 2022-10-30 DIAGNOSIS — Z89022 Acquired absence of left finger(s): Secondary | ICD-10-CM

## 2022-10-30 DIAGNOSIS — E785 Hyperlipidemia, unspecified: Secondary | ICD-10-CM | POA: Diagnosis not present

## 2022-10-30 DIAGNOSIS — T83511A Infection and inflammatory reaction due to indwelling urethral catheter, initial encounter: Secondary | ICD-10-CM | POA: Diagnosis not present

## 2022-10-30 DIAGNOSIS — R4182 Altered mental status, unspecified: Secondary | ICD-10-CM | POA: Diagnosis not present

## 2022-10-30 DIAGNOSIS — Z794 Long term (current) use of insulin: Secondary | ICD-10-CM

## 2022-10-30 DIAGNOSIS — Z8249 Family history of ischemic heart disease and other diseases of the circulatory system: Secondary | ICD-10-CM

## 2022-10-30 DIAGNOSIS — K219 Gastro-esophageal reflux disease without esophagitis: Secondary | ICD-10-CM | POA: Diagnosis not present

## 2022-10-30 DIAGNOSIS — R7989 Other specified abnormal findings of blood chemistry: Secondary | ICD-10-CM

## 2022-10-30 DIAGNOSIS — Z888 Allergy status to other drugs, medicaments and biological substances status: Secondary | ICD-10-CM

## 2022-10-30 DIAGNOSIS — F419 Anxiety disorder, unspecified: Secondary | ICD-10-CM | POA: Diagnosis not present

## 2022-10-30 DIAGNOSIS — F0390 Unspecified dementia without behavioral disturbance: Secondary | ICD-10-CM | POA: Insufficient documentation

## 2022-10-30 DIAGNOSIS — R0689 Other abnormalities of breathing: Secondary | ICD-10-CM | POA: Diagnosis not present

## 2022-10-30 DIAGNOSIS — Z1152 Encounter for screening for COVID-19: Secondary | ICD-10-CM

## 2022-10-30 DIAGNOSIS — I10 Essential (primary) hypertension: Secondary | ICD-10-CM | POA: Diagnosis present

## 2022-10-30 DIAGNOSIS — R636 Underweight: Secondary | ICD-10-CM | POA: Diagnosis present

## 2022-10-30 DIAGNOSIS — R739 Hyperglycemia, unspecified: Secondary | ICD-10-CM | POA: Diagnosis not present

## 2022-10-30 DIAGNOSIS — E1165 Type 2 diabetes mellitus with hyperglycemia: Secondary | ICD-10-CM | POA: Diagnosis present

## 2022-10-30 DIAGNOSIS — Z87891 Personal history of nicotine dependence: Secondary | ICD-10-CM

## 2022-10-30 DIAGNOSIS — Z66 Do not resuscitate: Secondary | ICD-10-CM | POA: Diagnosis present

## 2022-10-30 DIAGNOSIS — I700 Atherosclerosis of aorta: Secondary | ICD-10-CM | POA: Diagnosis not present

## 2022-10-30 DIAGNOSIS — R000 Tachycardia, unspecified: Secondary | ICD-10-CM

## 2022-10-30 DIAGNOSIS — R0602 Shortness of breath: Secondary | ICD-10-CM | POA: Diagnosis not present

## 2022-10-30 DIAGNOSIS — L89312 Pressure ulcer of right buttock, stage 2: Secondary | ICD-10-CM | POA: Diagnosis not present

## 2022-10-30 DIAGNOSIS — Z8673 Personal history of transient ischemic attack (TIA), and cerebral infarction without residual deficits: Secondary | ICD-10-CM

## 2022-10-30 DIAGNOSIS — N390 Urinary tract infection, site not specified: Secondary | ICD-10-CM | POA: Diagnosis not present

## 2022-10-30 DIAGNOSIS — A419 Sepsis, unspecified organism: Secondary | ICD-10-CM | POA: Diagnosis not present

## 2022-10-30 DIAGNOSIS — L89320 Pressure ulcer of left buttock, unstageable: Secondary | ICD-10-CM | POA: Diagnosis not present

## 2022-10-30 DIAGNOSIS — L89322 Pressure ulcer of left buttock, stage 2: Secondary | ICD-10-CM | POA: Diagnosis not present

## 2022-10-30 LAB — COMPREHENSIVE METABOLIC PANEL
ALT: 24 U/L (ref 0–44)
AST: 22 U/L (ref 15–41)
Albumin: 3.5 g/dL (ref 3.5–5.0)
Alkaline Phosphatase: 90 U/L (ref 38–126)
Anion gap: 17 — ABNORMAL HIGH (ref 5–15)
BUN: 19 mg/dL (ref 8–23)
CO2: 27 mmol/L (ref 22–32)
Calcium: 10.6 mg/dL — ABNORMAL HIGH (ref 8.9–10.3)
Chloride: 95 mmol/L — ABNORMAL LOW (ref 98–111)
Creatinine, Ser: 0.84 mg/dL (ref 0.61–1.24)
GFR, Estimated: >60 mL/min (ref >60)
Glucose, Bld: 263 mg/dL — ABNORMAL HIGH (ref 70–99)
Potassium: 3.9 mmol/L (ref 3.5–5.1)
Sodium: 139 mmol/L (ref 135–145)
Total Bilirubin: 0.4 mg/dL (ref 0.3–1.2)
Total Protein: 7.9 g/dL (ref 6.5–8.1)

## 2022-10-30 LAB — I-STAT VENOUS BLOOD GAS, ED
Acid-Base Excess: 4 mmol/L — ABNORMAL HIGH (ref 0.0–2.0)
Bicarbonate: 30.5 mmol/L — ABNORMAL HIGH (ref 20.0–28.0)
Calcium, Ion: 1.23 mmol/L (ref 1.15–1.40)
HCT: 46 % (ref 39.0–52.0)
Hemoglobin: 15.6 g/dL (ref 13.0–17.0)
O2 Saturation: 39 %
Potassium: 3.9 mmol/L (ref 3.5–5.1)
Sodium: 138 mmol/L (ref 135–145)
TCO2: 32 mmol/L (ref 22–32)
pCO2, Ven: 49.3 mm[Hg] (ref 44–60)
pH, Ven: 7.4 (ref 7.25–7.43)
pO2, Ven: 23 mm[Hg] — CL (ref 32–45)

## 2022-10-30 LAB — URINALYSIS, W/ REFLEX TO CULTURE (INFECTION SUSPECTED)
Bilirubin Urine: NEGATIVE
Glucose, UA: >=500 mg/dL — AB
Ketones, ur: 20 mg/dL — AB
Nitrite: POSITIVE — AB
Protein, ur: 100 mg/dL — AB
RBC / HPF: >50 RBC/hpf (ref 0–5)
Specific Gravity, Urine: 1.021 (ref 1.005–1.030)
WBC, UA: >50 WBC/hpf (ref 0–5)
pH: 5 (ref 5.0–8.0)

## 2022-10-30 LAB — CBC WITH DIFFERENTIAL/PLATELET
Abs Immature Granulocytes: 0.03 10*3/uL (ref 0.00–0.07)
Basophils Absolute: 0.1 10*3/uL (ref 0.0–0.1)
Basophils Relative: 1 %
Eosinophils Absolute: 0 10*3/uL (ref 0.0–0.5)
Eosinophils Relative: 0 %
HCT: 44.8 % (ref 39.0–52.0)
Hemoglobin: 14.3 g/dL (ref 13.0–17.0)
Immature Granulocytes: 0 %
Lymphocytes Relative: 14 %
Lymphs Abs: 1.7 10*3/uL (ref 0.7–4.0)
MCH: 29.5 pg (ref 26.0–34.0)
MCHC: 31.9 g/dL (ref 30.0–36.0)
MCV: 92.4 fL (ref 80.0–100.0)
Monocytes Absolute: 0.8 10*3/uL (ref 0.1–1.0)
Monocytes Relative: 6 %
Neutro Abs: 9.7 10*3/uL — ABNORMAL HIGH (ref 1.7–7.7)
Neutrophils Relative %: 79 %
Platelets: 340 10*3/uL (ref 150–400)
RBC: 4.85 MIL/uL (ref 4.22–5.81)
RDW: 14.6 % (ref 11.5–15.5)
WBC: 12.2 10*3/uL — ABNORMAL HIGH (ref 4.0–10.5)
nRBC: 0 % (ref 0.0–0.2)

## 2022-10-30 LAB — CBG MONITORING, ED: Glucose-Capillary: 182 mg/dL — ABNORMAL HIGH (ref 70–99)

## 2022-10-30 LAB — RESP PANEL BY RT-PCR (RSV, FLU A&B, COVID)  RVPGX2
Influenza A by PCR: NEGATIVE
Influenza B by PCR: NEGATIVE
Resp Syncytial Virus by PCR: NEGATIVE
SARS Coronavirus 2 by RT PCR: NEGATIVE

## 2022-10-30 LAB — TROPONIN I (HIGH SENSITIVITY)
Troponin I (High Sensitivity): 27 ng/L — ABNORMAL HIGH (ref <18)
Troponin I (High Sensitivity): 20 ng/L — ABNORMAL HIGH (ref <18)

## 2022-10-30 MED ORDER — INSULIN ASPART 100 UNIT/ML IJ SOLN
0.0000 [IU] | Freq: Three times a day (TID) | INTRAMUSCULAR | Status: DC
  Administered 2022-10-30: 2 [IU] via SUBCUTANEOUS
  Administered 2022-10-31 (×2): 1 [IU] via SUBCUTANEOUS
  Administered 2022-10-31: 5 [IU] via SUBCUTANEOUS
  Administered 2022-10-31: 1 [IU] via SUBCUTANEOUS
  Administered 2022-11-01: 7 [IU] via SUBCUTANEOUS
  Administered 2022-11-01 (×3): 3 [IU] via SUBCUTANEOUS
  Administered 2022-11-02: 2 [IU] via SUBCUTANEOUS
  Administered 2022-11-02: 1 [IU] via SUBCUTANEOUS
  Administered 2022-11-02: 3 [IU] via SUBCUTANEOUS
  Administered 2022-11-02: 2 [IU] via SUBCUTANEOUS
  Administered 2022-11-03: 7 [IU] via SUBCUTANEOUS
  Administered 2022-11-03: 2 [IU] via SUBCUTANEOUS

## 2022-10-30 MED ORDER — BOOST PLUS PO LIQD
237.0000 mL | Freq: Two times a day (BID) | ORAL | Status: DC
  Administered 2022-10-31 – 2022-11-03 (×7): 237 mL via ORAL
  Filled 2022-10-30 (×10): qty 237

## 2022-10-30 MED ORDER — ROSUVASTATIN CALCIUM 20 MG PO TABS
40.0000 mg | ORAL_TABLET | Freq: Every day | ORAL | Status: DC
  Administered 2022-10-31 – 2022-11-03 (×4): 40 mg via ORAL
  Filled 2022-10-30 (×4): qty 2

## 2022-10-30 MED ORDER — ACETAMINOPHEN 325 MG PO TABS: 650.0000 mg | ORAL_TABLET | Freq: Three times a day (TID) | ORAL | Status: DC | PRN

## 2022-10-30 MED ORDER — DONEPEZIL HCL 10 MG PO TABS
10.0000 mg | ORAL_TABLET | Freq: Every day | ORAL | Status: DC
  Administered 2022-10-31 – 2022-11-02 (×3): 10 mg via ORAL
  Filled 2022-10-30 (×4): qty 1

## 2022-10-30 MED ORDER — LOSARTAN POTASSIUM 50 MG PO TABS
50.0000 mg | ORAL_TABLET | Freq: Every day | ORAL | Status: DC
  Administered 2022-10-31: 50 mg via ORAL
  Filled 2022-10-30: qty 1

## 2022-10-30 MED ORDER — BOOST HIGH PROTEIN PO LIQD
1.0000 | Freq: Two times a day (BID) | ORAL | Status: DC
  Filled 2022-10-30 (×2): qty 237

## 2022-10-30 MED ORDER — SODIUM CHLORIDE 0.9 % IV SOLN
2.0000 g | Freq: Once | INTRAVENOUS | Status: AC
  Administered 2022-10-30: 2 g via INTRAVENOUS
  Filled 2022-10-30: qty 20

## 2022-10-30 MED ORDER — SODIUM CHLORIDE 0.9 % IV SOLN
1.0000 g | INTRAVENOUS | Status: DC
  Administered 2022-10-31 – 2022-11-02 (×3): 1 g via INTRAVENOUS
  Filled 2022-10-30 (×3): qty 10

## 2022-10-30 MED ORDER — MEMANTINE HCL 10 MG PO TABS
10.0000 mg | ORAL_TABLET | Freq: Two times a day (BID) | ORAL | Status: DC
  Administered 2022-10-31 – 2022-11-02 (×6): 10 mg via ORAL
  Filled 2022-10-30 (×8): qty 1

## 2022-10-30 MED ORDER — SODIUM CHLORIDE 0.9 % IV SOLN: INTRAVENOUS | Status: DC

## 2022-10-30 MED ORDER — HYDROCODONE-ACETAMINOPHEN 5-325 MG PO TABS
1.0000 | ORAL_TABLET | Freq: Four times a day (QID) | ORAL | Status: DC | PRN
  Administered 2022-11-03 (×2): 1 via ORAL
  Filled 2022-10-30 (×2): qty 1

## 2022-10-30 MED ORDER — TAMSULOSIN HCL 0.4 MG PO CAPS
0.4000 mg | ORAL_CAPSULE | Freq: Every day | ORAL | Status: DC
  Administered 2022-10-31 – 2022-11-03 (×4): 0.4 mg via ORAL
  Filled 2022-10-30 (×4): qty 1

## 2022-10-30 MED ORDER — SODIUM CHLORIDE 0.9 % IV BOLUS
1000.0000 mL | Freq: Once | INTRAVENOUS | Status: AC
  Administered 2022-10-30: 1000 mL via INTRAVENOUS

## 2022-10-30 MED ORDER — FINASTERIDE 5 MG PO TABS
5.0000 mg | ORAL_TABLET | Freq: Every day | ORAL | Status: DC
  Administered 2022-10-31 – 2022-11-03 (×4): 5 mg via ORAL
  Filled 2022-10-30 (×4): qty 1

## 2022-10-30 MED ORDER — ENOXAPARIN SODIUM 40 MG/0.4ML IJ SOSY
40.0000 mg | PREFILLED_SYRINGE | INTRAMUSCULAR | Status: DC
  Administered 2022-10-30 – 2022-11-02 (×4): 40 mg via SUBCUTANEOUS
  Filled 2022-10-30 (×4): qty 0.4

## 2022-10-30 NOTE — Assessment & Plan Note (Signed)
-  hospitalized for AKI secondary to bladder outlet obstruction in early September and discharged on Foley. Has been following with urology. Last saw rology Dr. Mena Goes on 10/7 and was recommended to continue foley for 2 weeks due to high risk for failure. -foley catheter was exchanged today in ED -Continue Tamsulosin and Finasteride.

## 2022-10-30 NOTE — Progress Notes (Signed)
Pt arrived to unit, assessed, informed of POC
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
## 2022-10-30 NOTE — ED Provider Notes (Signed)
[place] EMERGENCY DEPARTMENT AT [facility] Provider Note   CSN: [id number] Arrival date & time: 10/30/22  1413     History {Add pertinent medical, surgical, social history, OB history to HPI:1} Chief Complaint  Patient presents with   Tachycardia    [name] [name] is a 82 y.o. male.  82 year old patient with prior medical history as detailed below presents for evaluation.  Patient arrives from PCPs office with EMS transport.  Patient had presented to PCPs office for 2-week follow-up after recent admission for suspected urosepsis.  Patient with indwelling catheter since last admission.  Patient's wife provides majority of history.  She reports the patient seemed to be sleeping more for the last 48 hours.  Patient does have a history of dementia and is otherwise at baseline mental status.  Patient's wife reports that the urine obtained in the Foley catheter bag appears to be more concentrated darker than normal.        Home Medications Prior to Admission medications   Medication Sig Start Date End Date Taking? Authorizing Provider  acetaminophen (TYLENOL) 325 MG tablet Take 2 tablets (650 mg total) by mouth every 8 (eight) hours as needed for mild pain or moderate pain. 09/03/22   Montez Morita, PA-C  apixaban (ELIQUIS) 2.5 MG TABS tablet Take 1 tablet (2.5 mg total) by mouth 2 (two) times daily. 10/15/22 04/13/23  Donita Brooks, MD  B-D ULTRAFINE III SHORT PEN 31G X 8 MM MISC USE TO INJECT INSULIN EVERY DAY 05/26/22   Donita Brooks, MD  Blood Glucose Monitoring Suppl [facility] VERIO) w/Device KIT USE TO TEST BLOOD SUGAR LEVELS THREE TIMES DAILY 05/13/15   Dorena Bodo, PA-C  Cholecalciferol (DIALYVITE VITAMIN D 5000) 125 MCG (5000 UT) capsule Take 5,000 Units by mouth daily.    [provider]  Continuous Glucose Sensor (DEXCOM G7 SENSOR) MISC Inject 1 Application into the skin as directed. Change sensor every 10 days as directed. 06/17/22   Dani Gobble, NP  donepezil (ARICEPT) 10 MG tablet TAKE 1 TABLET(10 MG) BY MOUTH AT BEDTIME Patient taking differently: Take 10 mg by mouth at bedtime. 04/14/22   Donita Brooks, MD  feeding supplement (BOOST HIGH PROTEIN) LIQD Take 1 Container by mouth 2 (two) times daily between meals.    [provider]  finasteride (PROSCAR) 5 MG tablet Take 1 tablet (5 mg total) by mouth daily. 10/19/22   Jerilee Field, MD  HYDROcodone-acetaminophen (NORCO) 5-325 MG tablet Take 1 tablet by mouth every 6 (six) hours as needed for moderate pain. Ok to fill after 9/10 10/21/22   Donita Brooks, MD  insulin glargine (LANTUS SOLOSTAR) 100 UNIT/ML Solostar Pen Inject 8 Units into the skin daily. 09/25/22   Johnson, Clanford L, MD  Lancets (ONETOUCH DELICA PLUS LANCET33G) MISC USE TO CHECK BLOOD SUGAR TWICE DAILY. Dx: E11.9. 11/13/19   Donita Brooks, MD  memantine (NAMENDA) 10 MG tablet TAKE 1 TABLET(10 MG) BY MOUTH TWICE DAILY Patient taking differently: Take 10 mg by mouth 2 (two) times daily. 10/22/21   Donita Brooks, MD  nystatin (MYCOSTATIN/NYSTOP) powder Apply topically 2 (two) times daily. Apply to groin area and scrotum 09/25/22   [name], MD  [facility] VERIO test strip USE TO TEST BLOOD SUGAR TWO TO THREE TIMES DAILY 01/13/22   Donita Brooks, MD  pioglitazone (ACTOS) 45 MG tablet Take 1 tablet (45 mg total) by mouth daily. 06/17/22   Dani Gobble, NP  rosuvastatin (CRESTOR) 40 MG tablet TAKE 1 TABLET(40 MG) BY MOUTH DAILY Patient taking differently: Take 40 mg by mouth daily. 05/11/22   Donita Brooks, MD  tamsulosin (FLOMAX) 0.4 MG CAPS capsule Take 1 capsule (0.4 mg total) by mouth daily after supper. 10/12/22   Vassie Loll, MD      Allergies    Invokana [canagliflozin] and Plavix [clopidogrel bisulfate]    Review of Systems   Review of Systems  All other systems reviewed and are negative.   Physical Exam Updated Vital Signs BP (!) 146/76 (BP Location: Left Arm)    Pulse (!) 126   Temp (!) 97.5 F (36.4 C) (Oral)   Resp (!) 22   Ht 5\' 11"  (1.803 m)   Wt 55.8 kg   SpO2 100%   BMI 17.15 kg/m  Physical Exam Vitals and nursing note reviewed.  Constitutional:      General: He is not in acute distress.    Appearance: Normal appearance. He is well-developed.  HENT:     Head: Normocephalic and atraumatic.  Eyes:     Conjunctiva/sclera: Conjunctivae normal.     Pupils: Pupils are equal, round, and reactive to light.  Cardiovascular:     Rate and Rhythm: Regular rhythm. Tachycardia present.     Heart sounds: Normal heart sounds.  Pulmonary:     Effort: Pulmonary effort is normal. No respiratory distress.     Breath sounds: Normal breath sounds.  Abdominal:     General: There is no distension.     Palpations: Abdomen is soft.     Tenderness: There is no abdominal tenderness.  Musculoskeletal:        General: No deformity. Normal range of motion.     Cervical back: Normal range of motion and neck supple.  Skin:    General: Skin is warm and dry.  Neurological:     General: No focal deficit present.     Mental Status: He is alert and oriented to person, place, and time.     ED Results / Procedures / Treatments   Labs (all labs ordered are listed, but only abnormal results are displayed) Labs Reviewed  CULTURE, BLOOD (ROUTINE X 2)  CULTURE, BLOOD (ROUTINE X 2)  CBC WITH DIFFERENTIAL/PLATELET  COMPREHENSIVE METABOLIC PANEL  URINALYSIS, W/ REFLEX TO CULTURE (INFECTION SUSPECTED)  I-STAT VENOUS BLOOD GAS, ED  TROPONIN I (HIGH SENSITIVITY)    EKG None  Radiology No results found.  Procedures Procedures  {Document cardiac monitor, telemetry assessment procedure when appropriate:1}  Medications Ordered in ED Medications  sodium chloride 0.9 % bolus 1,000 mL (has no administration in time range)    ED Course/ Medical Decision Making/ A&P   {   Click here for ABCD2, HEART and other calculatorsREFRESH Note before signing :1}                               Medical Decision Making Amount and/or Complexity of Data Reviewed Labs: ordered. Radiology: ordered.    Medical Screen Complete  This patient presented to the ED with complaint of tachycardia.  This complaint involves an extensive number of treatment options. The initial differential diagnosis includes, but is not limited to, ***  This presentation is: {IllnessRisk:19196::"***","Acute","Chronic","Self-Limited","Previously Undiagnosed","Uncertain Prognosis","Complicated","Systemic Symptoms","Threat to Life/Bodily Function"}    Co morbidities that complicated the patient's evaluation  ***   Additional history obtained:  Additional history obtained from {History source:19196::"EMS","Spouse","Family","Friend","Caregiver"} External records from outside sources obtained  and reviewed including prior ED visits and prior Inpatient records.    Lab Tests:  I ordered and personally interpreted labs.  The pertinent results include:  ***   Imaging Studies ordered:  I ordered imaging studies including ***  I independently visualized and interpreted obtained imaging which showed *** I agree with the radiologist interpretation.   Cardiac Monitoring:  The patient was maintained on a cardiac monitor.  I personally viewed and interpreted the cardiac monitor which showed an underlying rhythm of: ***   Medicines ordered:  I ordered medication including ***  for ***  Reevaluation of the patient after these medicines showed that the patient: {resolved/improved/worsened:23923::"improved"}    Test Considered:  ***   Critical Interventions:  ***   Consultations Obtained:  I consulted ***,  and discussed lab and imaging findings as well as pertinent plan of care.    Problem List / ED Course:  ***   Reevaluation:  After the interventions noted above, I reevaluated the patient and found that they have:  {resolved/improved/worsened:23923::"improved"}   Social Determinants of Health:  ***   Disposition:  After consideration of the diagnostic results and the patients response to treatment, I feel that the patent would benefit from ***.    {Document critical care time when appropriate:1} {Document review of labs and clinical decision tools ie heart score, Chads2Vasc2 etc:1}  {Document your independent review of radiology images, and any outside records:1} {Document your discussion with family members, caretakers, and with consultants:1} {Document social determinants of health affecting pt's care:1} {Document your decision making why or why not admission, treatments were needed:1} Final Clinical Impression(s) / ED Diagnoses Final diagnoses:  None    Rx / DC Orders ED Discharge Orders     None

## 2022-10-30 NOTE — Assessment & Plan Note (Signed)
-  Troponin of 27 and 20. Suspect more demand from urosepsis.
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
## 2022-10-30 NOTE — ED Provider Notes (Signed)
  Physical Exam  BP 138/64   Pulse (!) 104   Temp 98.2 F (36.8 C) (Oral)   Resp 20   Ht 5\' 11"  (1.803 m)   Wt 55.8 kg   SpO2 100%   BMI 17.15 kg/m   Physical Exam  Procedures  Procedures  ED Course / MDM    Medical Decision Making Amount and/or Complexity of Data Reviewed Labs: ordered. Radiology: ordered.   Received in signout.  Decreased energy.  Foley catheter in place.  Decreased oral intake.  Tachycardia.  Tachycardia improving.  Appears to be in sinus.  Potentially due to dehydration but also with decreased mental status could be due to infection.  Urinalysis does show likely infection.  With decreasing mental status and that that is worth treating.  Reviewed previous culture and previous discharge report.  Has baseline dementia.  Doubt severe sepsis at this time.  Will discuss with hospitalist for admission.       Benjiman Core, MD 10/30/22 1816

## 2022-10-30 NOTE — Assessment & Plan Note (Signed)
-  Last A1c OF 9.1 in June -place on sensitive SSI  -Hold Actos

## 2022-10-30 NOTE — Assessment & Plan Note (Signed)
-  oriented only to self  -continue memantine and donepezil

## 2022-10-30 NOTE — Assessment & Plan Note (Addendum)
-  had questionable failure on aspirin and had intolerance to Plavix and Aggrenox. Not currently on any antiplatelet therapy.
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
## 2022-10-30 NOTE — Assessment & Plan Note (Addendum)
-  hx of ORIF on 09/01/22. Has not followed up with orthopedic due to repeated hospitalization. He is well past 30 days of his DVT prophylaxis. Will discontinue Eliquis.

## 2022-10-30 NOTE — Assessment & Plan Note (Signed)
-  continue losartan

## 2022-10-30 NOTE — ED Triage Notes (Signed)
Patient presents via EMS from PCP appointment. Per EMS, patient had a foley catheter placed on 9/28 for urosepsis and patient had been instructed to keep the catheter in place for 2 weeks. Per EMS, patient's wife noticed that patient was fatigued yesterday and sleeping more and that his urine was darker than normal. Per EMS, patient has history of dementia. Per EMS, patient has been hyperglycemic since his discharge from the hospital.

## 2022-10-30 NOTE — Assessment & Plan Note (Signed)
-  keep on continuous IV fluid overnight

## 2022-10-30 NOTE — ED Notes (Signed)
Got patient on the monitor into a gown did EKG shown to Dr Erin Hearing patient is resting with call bell in reach and family at bedside

## 2022-10-30 NOTE — Assessment & Plan Note (Addendum)
-  presented with tachycardia and leukocytosis -continue IV Rocephin -previous had cultures positive for proteus mirabilis and resistant to nitrofurantion but pan-sensitive to others

## 2022-10-31 ENCOUNTER — Encounter (HOSPITAL_COMMUNITY): Payer: Self-pay | Admitting: Family Medicine

## 2022-10-31 DIAGNOSIS — N390 Urinary tract infection, site not specified: Secondary | ICD-10-CM | POA: Diagnosis not present

## 2022-10-31 DIAGNOSIS — A419 Sepsis, unspecified organism: Secondary | ICD-10-CM | POA: Diagnosis not present

## 2022-10-31 LAB — BASIC METABOLIC PANEL
Anion gap: 14 (ref 5–15)
BUN: 16 mg/dL (ref 8–23)
CO2: 23 mmol/L (ref 22–32)
Calcium: 9.6 mg/dL (ref 8.9–10.3)
Chloride: 102 mmol/L (ref 98–111)
Creatinine, Ser: 0.6 mg/dL — ABNORMAL LOW (ref 0.61–1.24)
GFR, Estimated: >60 mL/min (ref >60)
Glucose, Bld: 138 mg/dL — ABNORMAL HIGH (ref 70–99)
Potassium: 3.5 mmol/L (ref 3.5–5.1)
Sodium: 139 mmol/L (ref 135–145)

## 2022-10-31 LAB — CBC
HCT: 36.5 % — ABNORMAL LOW (ref 39.0–52.0)
Hemoglobin: 11.3 g/dL — ABNORMAL LOW (ref 13.0–17.0)
MCH: 29.2 pg (ref 26.0–34.0)
MCHC: 31 g/dL (ref 30.0–36.0)
MCV: 94.3 fL (ref 80.0–100.0)
Platelets: 238 10*3/uL (ref 150–400)
RBC: 3.87 MIL/uL — ABNORMAL LOW (ref 4.22–5.81)
RDW: 14.6 % (ref 11.5–15.5)
WBC: 10.5 10*3/uL (ref 4.0–10.5)
nRBC: 0 % (ref 0.0–0.2)

## 2022-10-31 LAB — GLUCOSE, CAPILLARY
Glucose-Capillary: 145 mg/dL — ABNORMAL HIGH (ref 70–99)
Glucose-Capillary: 154 mg/dL — ABNORMAL HIGH (ref 70–99)
Glucose-Capillary: 138 mg/dL — ABNORMAL HIGH (ref 70–99)
Glucose-Capillary: 271 mg/dL — ABNORMAL HIGH (ref 70–99)

## 2022-10-31 MED ORDER — CHLORHEXIDINE GLUCONATE CLOTH 2 % EX PADS
6.0000 | MEDICATED_PAD | Freq: Every day | CUTANEOUS | Status: DC
  Administered 2022-10-31 – 2022-11-03 (×4): 6 via TOPICAL

## 2022-10-31 MED ORDER — LOSARTAN POTASSIUM 25 MG PO TABS
25.0000 mg | ORAL_TABLET | Freq: Every day | ORAL | Status: DC
  Administered 2022-11-01 – 2022-11-03 (×3): 25 mg via ORAL
  Filled 2022-10-31 (×3): qty 1

## 2022-10-31 NOTE — Plan of Care (Signed)
  Problem: Nutritional: Goal: Maintenance of adequate nutrition will improve Outcome: Progressing   Problem: Skin Integrity: Goal: Risk for impaired skin integrity will decrease Outcome: Progressing   Problem: Nutrition: Goal: Adequate nutrition will be maintained Outcome: Progressing   Problem: Safety: Goal: Ability to remain free from injury will improve Outcome: Progressing

## 2022-10-31 NOTE — Progress Notes (Signed)
PROGRESS NOTE    [name] [name]  ZHY:[id number] DOB: [birth year] DOA: 10/30/2022 PCP: Donita Brooks, MD    Brief Narrative:  [name] [name] is a 82 y.o. male with medical history significant of dementia, CVA, HTN, HLD, T2DM who presents at the advise of PCP for evaluation of sepsis and dehydration.    He presented to PCP for hospital follow up today and was ill-appearing and in sinus tachycardia up to 138. Wife reported delirium and confusion last night. (She became ill while in the hospital with him and is also being admitted today.)    He was hospitalized from 10/09/22-10/12/22 for sepsis secondary to Proteus mirabilis UTI from indwelling foley. Prior to that had hospitalization earlier in September with AKI secondary to bladder outlet obstruction and discharged on foley catheter.  Had follow up with urology Dr. Mena Goes on 10/7 and was recommended to continue foley for 2 weeks due to high risk for failure. He was continue on Tamsulosin and finasteride was added.    Assessment and Plan: Tachycardia-- ?Sepsis secondary to UTI (HCC) vs dehydration -presented with tachycardia and leukocytosis -continue IV Rocephin -previous had cultures positive for proteus mirabilis and resistant to nitrofurantion but pan-sensitive to others -culture pending   History of repair of right hip joint -hx of ORIF on 09/01/22. Has not followed up with orthopedic due to repeated hospitalization. He is well past 30 days of his DVT prophylaxis. Will discontinue Eliquis.   History of CVA (cerebrovascular accident) -had questionable failure on aspirin and had intolerance to Plavix and Aggrenox. Not currently on any antiplatelet therapy.  Chronic indwelling Foley catheter -hospitalized for AKI secondary to bladder outlet obstruction in early September and discharged on Foley. Has been following with urology. Last saw rology Dr. Mena Goes on 10/7 and was recommended to continue foley for 2 weeks due to high risk for  failure. -foley catheter was exchanged today in ED -Continue Tamsulosin and Finasteride.   Elevated troponin -Troponin of 27 and 20. Suspect more demand from urosepsis.   Metabolic acidosis -resolved  Type 2 diabetes mellitus with hyperglycemia, with long-term current use of insulin (HCC) -Last A1c OF 9.1 in June -place on sensitive SSI  -Hold Actos  Dementia without behavioral disturbance (HCC) -oriented only to self/place-- baseline per wife -continue memantine and donepezil   Hypertension -continue losartan but at lower dose     DVT prophylaxis: enoxaparin (LOVENOX) injection 40 mg Start: 10/30/22 2200    Code Status: Limited: Do not attempt resuscitation (DNR) -DNR-LIMITED -Do Not Intubate/DNI  Family Communication:  Updated wife Disposition Plan:  Level of care: Telemetry Medical Status is: Inpatient Remains inpatient appropriate     Consultants:  none   Subjective: No issues overnight  Objective: Vitals:   10/30/22 2331 10/31/22 0428 10/31/22 0847 10/31/22 1253  BP: 129/62 124/73 135/73 (!) 117/59  Pulse: 95 94 100 88  Resp: 19 17 17 17   Temp: 97.6 F (36.4 C) 97.8 F (36.6 C) (!) 97.2 F (36.2 C) 98.2 F (36.8 C)  TempSrc: Oral Oral    SpO2: 95% 100% 100% 99%  Weight: 56 kg     Height: 6' (1.829 m)       Intake/Output Summary (Last 24 hours) at 10/31/2022 1310 Last data filed at 10/31/2022 0900 Gross per 24 hour  Intake 1216.92 ml  Output --  Net 1216.92 ml   Filed Weights   10/30/22 1421 10/30/22 2331  Weight: 55.8 kg 56 kg    Examination:   General: Appearance:  Thin male in no acute distress     Lungs:     Clear to auscultation bilaterally, respirations unlabored  Heart:    Normal heart rate. .       Neurologic:   Awake, alert, oriented x 2. pleasant       Data Reviewed: I have personally reviewed following labs and imaging studies  CBC: Recent Labs  Lab 10/30/22 1439 10/30/22 1445 10/31/22 0253  WBC 12.2*  --   10.5  NEUTROABS 9.7*  --   --   HGB 14.3 15.6 11.3*  HCT 44.8 46.0 36.5*  MCV 92.4  --  94.3  PLT 340  --  238   Basic Metabolic Panel: Recent Labs  Lab 10/30/22 1439 10/30/22 1445 10/31/22 0253  NA 139 138 139  K 3.9 3.9 3.5  CL 95*  --  102  CO2 27  --  23  GLUCOSE 263*  --  138*  BUN 19  --  16  CREATININE 0.84  --  0.60*  CALCIUM 10.6*  --  9.6   GFR: Estimated Creatinine Clearance: 57.4 mL/min (A) (by C-G formula based on SCr of 0.6 mg/dL (L)). Liver Function Tests: Recent Labs  Lab 10/30/22 1439  AST 22  ALT 24  ALKPHOS 90  BILITOT 0.4  PROT 7.9  ALBUMIN 3.5   No results for input(s): "LIPASE", "AMYLASE" in the last 168 hours. No results for input(s): "AMMONIA" in the last 168 hours. Coagulation Profile: No results for input(s): "INR", "PROTIME" in the last 168 hours. Cardiac Enzymes: No results for input(s): "CKTOTAL", "CKMB", "CKMBINDEX", "TROPONINI" in the last 168 hours. BNP (last 3 results) No results for input(s): "PROBNP" in the last 8760 hours. HbA1C: No results for input(s): "HGBA1C" in the last 72 hours. CBG: Recent Labs  Lab 10/30/22 2157 10/31/22 0846 10/31/22 1251  GLUCAP 182* 145* 154*   Lipid Profile: No results for input(s): "CHOL", "HDL", "LDLCALC", "TRIG", "CHOLHDL", "LDLDIRECT" in the last 72 hours. Thyroid Function Tests: No results for input(s): "TSH", "T4TOTAL", "FREET4", "T3FREE", "THYROIDAB" in the last 72 hours. Anemia Panel: No results for input(s): "VITAMINB12", "FOLATE", "FERRITIN", "TIBC", "IRON", "RETICCTPCT" in the last 72 hours. Sepsis Labs: No results for input(s): "PROCALCITON", "LATICACIDVEN" in the last 168 hours.  Recent Results (from the past 240 hour(s))  Culture, blood (routine x 2)     Status: None (Preliminary result)   Collection Time: 10/30/22  2:27 PM   Specimen: BLOOD LEFT FOREARM  Result Value Ref Range Status   Specimen Description BLOOD LEFT FOREARM  Final   Special Requests   Final    BOTTLES  DRAWN AEROBIC AND ANAEROBIC Blood Culture results may not be optimal due to an excessive volume of blood received in culture bottles   Culture   Final    NO GROWTH < 24 HOURS Performed at [facility], [address], [place], [place] [address]    Report Status PENDING  Incomplete  Culture, blood (routine x 2)     Status: None (Preliminary result)   Collection Time: 10/30/22  2:39 PM   Specimen: BLOOD RIGHT FOREARM  Result Value Ref Range Status   Specimen Description BLOOD RIGHT FOREARM  Final   Special Requests   Final    BOTTLES DRAWN AEROBIC AND ANAEROBIC Blood Culture results may not be optimal due to an excessive volume of blood received in culture bottles   Culture   Final    NO GROWTH < 24 HOURS Performed at [facility], [address] [place], [place] [address]    Report Status PENDING  Incomplete  Resp panel by RT-PCR (RSV, Flu A&B, Covid)     Status: None   Collection Time: 10/30/22  2:42 PM   Specimen: Nasal Swab  Result Value Ref Range Status   SARS Coronavirus 2 by RT PCR NEGATIVE NEGATIVE Final   Influenza A by PCR NEGATIVE NEGATIVE Final   Influenza B by PCR NEGATIVE NEGATIVE Final    Comment: (NOTE) The Xpert Xpress SARS-CoV-2/FLU/RSV plus assay is intended as an aid in the diagnosis of influenza from Nasopharyngeal swab specimens and should not be used as a sole basis for treatment. Nasal washings and aspirates are unacceptable for Xpert Xpress SARS-CoV-2/FLU/RSV testing.  Fact Sheet for Patients: BloggerCourse.com  Fact Sheet for Healthcare Providers: SeriousBroker.it  This test is not yet approved or cleared by the [place] FDA and has been authorized for detection and/or diagnosis of SARS-CoV-2 by FDA under an Emergency Use Authorization (EUA). This EUA will remain in effect (meaning this test can be used) for the duration of the COVID-19 declaration under Section 564(b)(1) of the Act,  21 U.S.C. section 360bbb-3(b)(1), unless the authorization is terminated or revoked.     Resp Syncytial Virus by PCR NEGATIVE NEGATIVE Final    Comment: (NOTE) Fact Sheet for Patients: BloggerCourse.com  Fact Sheet for Healthcare Providers: SeriousBroker.it  This test is not yet approved or cleared by the [place] FDA and has been authorized for detection and/or diagnosis of SARS-CoV-2 by FDA under an Emergency Use Authorization (EUA). This EUA will remain in effect (meaning this test can be used) for the duration of the COVID-19 declaration under Section 564(b)(1) of the Act, 21 U.S.C. section 360bbb-3(b)(1), unless the authorization is terminated or revoked.  Performed at [facility], [address] [place], [place] [address]          Radiology Studies: DG Chest Port 1 View  Result Date: 10/30/2022 CLINICAL DATA:  Shortness of breath. EXAM: PORTABLE CHEST 1 VIEW COMPARISON:  Chest radiograph dated October 09, 2022. FINDINGS: The heart size and mediastinal contours are within normal limits. Aortic calcification. Both lungs are clear. No pneumothorax or pleural effusion. No acute osseous abnormality. IMPRESSION: No acute cardiopulmonary findings. Electronically Signed   By: Hart Robinsons M.D.   On: 10/30/2022 16:36        Scheduled Meds:  Chlorhexidine Gluconate Cloth  6 each Topical Daily   donepezil  10 mg Oral QHS   enoxaparin (LOVENOX) injection  40 mg Subcutaneous Q24H   finasteride  5 mg Oral Daily   insulin aspart  0-9 Units Subcutaneous TID PC & HS   lactose free nutrition  237 mL Oral BID BM   losartan  50 mg Oral Daily   memantine  10 mg Oral BID   rosuvastatin  40 mg Oral Daily   tamsulosin  0.4 mg Oral QPC supper   Continuous Infusions:  sodium chloride 50 mL/hr at 10/30/22 2335   cefTRIAXone (ROCEPHIN)  IV 1 g (10/31/22 0942)     LOS: 1 day    Time spent: 45 minutes spent on  chart review, discussion with nursing staff, consultants, updating family and interview/physical exam; more than 50% of that time was spent in counseling and/or coordination of care.    [name] Art, DO [facility] Available via Epic secure chat 7am-7pm After these hours, please refer to coverage provider listed on [email or web address] 10/31/2022, 1:10 PM

## 2022-11-01 DIAGNOSIS — N390 Urinary tract infection, site not specified: Secondary | ICD-10-CM | POA: Diagnosis not present

## 2022-11-01 DIAGNOSIS — A419 Sepsis, unspecified organism: Secondary | ICD-10-CM | POA: Diagnosis not present

## 2022-11-01 LAB — GLUCOSE, CAPILLARY
Glucose-Capillary: 335 mg/dL — ABNORMAL HIGH (ref 70–99)
Glucose-Capillary: 207 mg/dL — ABNORMAL HIGH (ref 70–99)
Glucose-Capillary: 261 mg/dL — ABNORMAL HIGH (ref 70–99)
Glucose-Capillary: 238 mg/dL — ABNORMAL HIGH (ref 70–99)
Glucose-Capillary: 225 mg/dL — ABNORMAL HIGH (ref 70–99)

## 2022-11-01 MED ORDER — INSULIN GLARGINE-YFGN 100 UNIT/ML ~~LOC~~ SOLN
8.0000 [IU] | Freq: Every day | SUBCUTANEOUS | Status: DC
  Administered 2022-11-01 – 2022-11-02 (×2): 8 [IU] via SUBCUTANEOUS
  Filled 2022-11-01 (×2): qty 0.08

## 2022-11-01 NOTE — Plan of Care (Signed)
  Problem: Education: Goal: Ability to describe self-care measures that may prevent or decrease complications (Diabetes Survival Skills Education) will improve Outcome: Progressing Goal: Individualized Educational Video(s) Outcome: Progressing   Problem: Respiratory: Goal: Ability to maintain adequate ventilation will improve Outcome: Progressing   Problem: Clinical Measurements: Goal: Diagnostic test results will improve Outcome: Progressing Goal: Signs and symptoms of infection will decrease Outcome: Progressing

## 2022-11-01 NOTE — Plan of Care (Signed)
  Problem: Clinical Measurements: Goal: Diagnostic test results will improve Outcome: Progressing Goal: Signs and symptoms of infection will decrease Outcome: Progressing   Problem: Coping: Goal: Ability to adjust to condition or change in health will improve Outcome: Progressing   Problem: Fluid Volume: Goal: Ability to maintain a balanced intake and output will improve Outcome: Progressing

## 2022-11-01 NOTE — TOC Progression Note (Signed)
Transition of Care [facility]) - Progression Note    Patient Details  Name: [name] [name] MRN: [id number] Date of Birth: 1940/09/10  [facility] CM/SW Contact  Ronny Bacon, RN Phone Number: 11/01/2022, 12:14 PM  Clinical Narrative:   Secure chat message received from floor nurse regarding wife wanting to speak to someone about him. Call to wife, wife upset that a nurse told her niece last night that he was going to a SNF. Wife reports that no one said anything to her about it and could not understand how a decision was made without her knowledge. Wife reports spoke with Dr. Benjamine Mola today and was told that patient would be discharged home when ready. Wife confirms that they have a son available at home 24/7 to care for patient when discharged. Wife does not want patient to go to SNF at discharge. Floor nurse & CSW made aware.         Expected Discharge Plan and Services                                               Social Determinants of Health (SDOH) Interventions SDOH Screenings   Food Insecurity: Patient Unable To Answer (10/30/2022)  Housing: Patient Unable To Answer (10/09/2022)  Transportation Needs: Patient Unable To Answer (10/30/2022)  Utilities: Patient Unable To Answer (10/30/2022)  Alcohol Screen: Low Risk  (11/05/2021)  Depression (PHQ2-9): High Risk (11/05/2021)  Financial Resource Strain: Low Risk  (11/05/2021)  Physical Activity: Insufficiently Active (11/05/2021)  Social Connections: Moderately Integrated (11/05/2021)  Stress: No Stress Concern Present (11/05/2021)  Tobacco Use: Medium Risk (10/31/2022)    Readmission Risk Interventions    10/10/2022    4:39 PM  Readmission Risk Prevention Plan  Transportation Screening Complete  PCP or Specialist Appt within 3-5 Days Complete  HRI or Home Care Consult Complete  Social Work Consult for Recovery Care Planning/Counseling Complete  Palliative Care Screening Not Applicable  Medication  Review Oceanographer) Complete

## 2022-11-01 NOTE — Progress Notes (Signed)
PROGRESS NOTE    [name] [name]  ZOX:[id number] DOB: 1940-07-03 DOA: 10/30/2022 PCP: Donita Brooks, MD    Brief Narrative:  [name] [name] is a 82 y.o. male with medical history significant of dementia, CVA, HTN, HLD, T2DM who presents at the advise of PCP for evaluation of sepsis and dehydration.    He presented to PCP for hospital follow up today and was ill-appearing and in sinus tachycardia up to 138. Wife reported delirium and confusion last night. (She became ill while in the hospital with him and is also being admitted today.)    He was hospitalized from 10/09/22-10/12/22 for sepsis secondary to Proteus mirabilis UTI from indwelling foley. Prior to that had hospitalization earlier in September with AKI secondary to bladder outlet obstruction and discharged on foley catheter.  Had follow up with urology Dr. Mena Goes on 10/7 and was recommended to continue foley for 2 weeks due to high risk for failure. He was continue on Tamsulosin and finasteride was added.    Assessment and Plan: Tachycardia-- ?Sepsis secondary to UTI (HCC) vs dehydration -presented with tachycardia and leukocytosis -urine culture with staph aureus- await sensitivities// blood cultures negative -previous had cultures positive for proteus mirabilis and resistant to nitrofurantion but pan-sensitive to others   History of repair of right hip joint -hx of ORIF on 09/01/22. Has not followed up with orthopedic due to repeated hospitalization. He is well past 30 days of his DVT prophylaxis. Will discontinue Eliquis.   History of CVA (cerebrovascular accident) -had questionable failure on aspirin and had intolerance to Plavix and Aggrenox. Not currently on any antiplatelet therapy.  Chronic indwelling Foley catheter -hospitalized for AKI secondary to bladder outlet obstruction in early September and discharged on Foley. Has been following with urology. Last saw rology Dr. Mena Goes on 10/7 and was recommended to continue  foley for 2 weeks due to high risk for failure. -foley catheter was exchanged in ED -Continue Tamsulosin and Finasteride.   Elevated troponin -Troponin of 27 and 20. Suspect more demand from urosepsis.   Metabolic acidosis -appears to be at baseline  Type 2 diabetes mellitus with hyperglycemia, with long-term current use of insulin (HCC) -Last A1c OF 9.1 in June -place on sensitive SSI  -resume lantus  Dementia without behavioral disturbance (HCC) -oriented only to self/place-- baseline per wife -continue memantine and donepezil   Hypertension -continue losartan but at lower dose     DVT prophylaxis: enoxaparin (LOVENOX) injection 40 mg Start: 10/30/22 2200    Code Status: Limited: Do not attempt resuscitation (DNR) -DNR-LIMITED -Do Not Intubate/DNI  Family Communication:  Updated wife Disposition Plan:  Level of care: Telemetry Medical Status is: Inpatient Remains inpatient appropriate     Consultants:  none   Subjective: Does not want to eat  Objective: Vitals:   10/31/22 1253 10/31/22 1625 10/31/22 1954 11/01/22 0844  BP: (!) 117/59 127/65 (!) 128/51 118/82  Pulse: 88 77 98 96  Resp: 17 17 18 17   Temp: 98.2 F (36.8 C) (!) 97.5 F (36.4 C) 98.3 F (36.8 C) (!) 97.5 F (36.4 C)  TempSrc:   Oral   SpO2: 99% 100% 99% 99%  Weight:      Height:        Intake/Output Summary (Last 24 hours) at 11/01/2022 1111 Last data filed at 11/01/2022 0900 Gross per 24 hour  Intake 1883.28 ml  Output 1350 ml  Net 533.28 ml   Filed Weights   10/30/22 1421 10/30/22 2331  Weight: 55.8 kg  56 kg    Examination:   General: Appearance:    Thin male in no acute distress     Lungs:      respirations unlabored  Heart:    Normal heart rate. .       Neurologic:   Awake, alert, irritable today       Data Reviewed: I have personally reviewed following labs and imaging studies  CBC: Recent Labs  Lab 10/30/22 1439 10/30/22 1445 10/31/22 0253  WBC 12.2*  --   10.5  NEUTROABS 9.7*  --   --   HGB 14.3 15.6 11.3*  HCT 44.8 46.0 36.5*  MCV 92.4  --  94.3  PLT 340  --  238   Basic Metabolic Panel: Recent Labs  Lab 10/30/22 1439 10/30/22 1445 10/31/22 0253  NA 139 138 139  K 3.9 3.9 3.5  CL 95*  --  102  CO2 27  --  23  GLUCOSE 263*  --  138*  BUN 19  --  16  CREATININE 0.84  --  0.60*  CALCIUM 10.6*  --  9.6   GFR: Estimated Creatinine Clearance: 57.4 mL/min (A) (by C-G formula based on SCr of 0.6 mg/dL (L)). Liver Function Tests: Recent Labs  Lab 10/30/22 1439  AST 22  ALT 24  ALKPHOS 90  BILITOT 0.4  PROT 7.9  ALBUMIN 3.5   No results for input(s): "LIPASE", "AMYLASE" in the last 168 hours. No results for input(s): "AMMONIA" in the last 168 hours. Coagulation Profile: No results for input(s): "INR", "PROTIME" in the last 168 hours. Cardiac Enzymes: No results for input(s): "CKTOTAL", "CKMB", "CKMBINDEX", "TROPONINI" in the last 168 hours. BNP (last 3 results) No results for input(s): "PROBNP" in the last 8760 hours. HbA1C: No results for input(s): "HGBA1C" in the last 72 hours. CBG: Recent Labs  Lab 10/31/22 0846 10/31/22 1251 10/31/22 1626 10/31/22 1958 11/01/22 0846  GLUCAP 145* 154* 138* 271* 335*   Lipid Profile: No results for input(s): "CHOL", "HDL", "LDLCALC", "TRIG", "CHOLHDL", "LDLDIRECT" in the last 72 hours. Thyroid Function Tests: No results for input(s): "TSH", "T4TOTAL", "FREET4", "T3FREE", "THYROIDAB" in the last 72 hours. Anemia Panel: No results for input(s): "VITAMINB12", "FOLATE", "FERRITIN", "TIBC", "IRON", "RETICCTPCT" in the last 72 hours. Sepsis Labs: No results for input(s): "PROCALCITON", "LATICACIDVEN" in the last 168 hours.  Recent Results (from the past 240 hour(s))  Culture, blood (routine x 2)     Status: None (Preliminary result)   Collection Time: 10/30/22  2:27 PM   Specimen: BLOOD LEFT FOREARM  Result Value Ref Range Status   Specimen Description BLOOD LEFT FOREARM  Final    Special Requests   Final    BOTTLES DRAWN AEROBIC AND ANAEROBIC Blood Culture results may not be optimal due to an excessive volume of blood received in culture bottles   Culture   Final    NO GROWTH 2 DAYS Performed at [facility] Lab, [address] [place], [place] [address]    Report Status PENDING  Incomplete  Culture, blood (routine x 2)     Status: None (Preliminary result)   Collection Time: 10/30/22  2:39 PM   Specimen: BLOOD RIGHT FOREARM  Result Value Ref Range Status   Specimen Description BLOOD RIGHT FOREARM  Final   Special Requests   Final    BOTTLES DRAWN AEROBIC AND ANAEROBIC Blood Culture results may not be optimal due to an excessive volume of blood received in culture bottles   Culture  Final    NO GROWTH 2 DAYS Performed at [facility], [address] [place], [place] [address]    Report Status PENDING  Incomplete  Resp panel by RT-PCR (RSV, Flu A&B, Covid)     Status: None   Collection Time: 10/30/22  2:42 PM   Specimen: Nasal Swab  Result Value Ref Range Status   SARS Coronavirus 2 by RT PCR NEGATIVE NEGATIVE Final   Influenza A by PCR NEGATIVE NEGATIVE Final   Influenza B by PCR NEGATIVE NEGATIVE Final    Comment: (NOTE) The Xpert Xpress SARS-CoV-2/FLU/RSV plus assay is intended as an aid in the diagnosis of influenza from Nasopharyngeal swab specimens and should not be used as a sole basis for treatment. Nasal washings and aspirates are unacceptable for Xpert Xpress SARS-CoV-2/FLU/RSV testing.  Fact Sheet for Patients: BloggerCourse.com  Fact Sheet for Healthcare Providers: SeriousBroker.it  This test is not yet approved or cleared by the [place] FDA and has been authorized for detection and/or diagnosis of SARS-CoV-2 by FDA under an Emergency Use Authorization (EUA). This EUA will remain in effect (meaning this test can be used) for the duration of the COVID-19 declaration under  Section 564(b)(1) of the Act, 21 U.S.C. section 360bbb-3(b)(1), unless the authorization is terminated or revoked.     Resp Syncytial Virus by PCR NEGATIVE NEGATIVE Final    Comment: (NOTE) Fact Sheet for Patients: BloggerCourse.com  Fact Sheet for Healthcare Providers: SeriousBroker.it  This test is not yet approved or cleared by the [place] FDA and has been authorized for detection and/or diagnosis of SARS-CoV-2 by FDA under an Emergency Use Authorization (EUA). This EUA will remain in effect (meaning this test can be used) for the duration of the COVID-19 declaration under Section 564(b)(1) of the Act, 21 U.S.C. section 360bbb-3(b)(1), unless the authorization is terminated or revoked.  Performed at [facility] Lab, [address], [place], [place] [address]   Urine Culture     Status: Abnormal (Preliminary result)   Collection Time: 10/30/22  3:32 PM   Specimen: Urine, Random  Result Value Ref Range Status   Specimen Description URINE, RANDOM  Final   Special Requests NONE Reflexed from [id number]  Final   Culture (A)  Final    >=100,000 COLONIES/mL STAPHYLOCOCCUS AUREUS CULTURE REINCUBATED FOR BETTER GROWTH SUSCEPTIBILITIES TO FOLLOW Performed at [facility], [address], [place], [place] [address]    Report Status PENDING  Incomplete         Radiology Studies: DG Chest Port 1 View  Result Date: 10/30/2022 CLINICAL DATA:  Shortness of breath. EXAM: PORTABLE CHEST 1 VIEW COMPARISON:  Chest radiograph dated October 09, 2022. FINDINGS: The heart size and mediastinal contours are within normal limits. Aortic calcification. Both lungs are clear. No pneumothorax or pleural effusion. No acute osseous abnormality. IMPRESSION: No acute cardiopulmonary findings. Electronically Signed   By: Hart Robinsons M.D.   On: 10/30/2022 16:36        Scheduled Meds:  Chlorhexidine Gluconate Cloth  6 each Topical  Daily   donepezil  10 mg Oral QHS   enoxaparin (LOVENOX) injection  40 mg Subcutaneous Q24H   finasteride  5 mg Oral Daily   insulin aspart  0-9 Units Subcutaneous TID PC & HS   lactose free nutrition  237 mL Oral BID BM   losartan  25 mg Oral Daily   memantine  10 mg Oral BID   rosuvastatin  40 mg Oral Daily  tamsulosin  0.4 mg Oral QPC supper   Continuous Infusions:  cefTRIAXone (ROCEPHIN)  IV 1 g (11/01/22 0928)     LOS: 2 days    Time spent: 45 minutes spent on chart review, discussion with nursing staff, consultants, updating family and interview/physical exam; more than 50% of that time was spent in counseling and/or coordination of care.    Joseph Art, DO [facility] Available via Epic secure chat 7am-7pm After these hours, please refer to coverage provider listed on [email or web address] 11/01/2022, 11:11 AM

## 2022-11-02 DIAGNOSIS — N390 Urinary tract infection, site not specified: Secondary | ICD-10-CM | POA: Diagnosis not present

## 2022-11-02 DIAGNOSIS — A419 Sepsis, unspecified organism: Secondary | ICD-10-CM | POA: Diagnosis not present

## 2022-11-02 LAB — GLUCOSE, CAPILLARY
Glucose-Capillary: 181 mg/dL — ABNORMAL HIGH (ref 70–99)
Glucose-Capillary: 189 mg/dL — ABNORMAL HIGH (ref 70–99)
Glucose-Capillary: 208 mg/dL — ABNORMAL HIGH (ref 70–99)
Glucose-Capillary: 121 mg/dL — ABNORMAL HIGH (ref 70–99)

## 2022-11-02 LAB — URINE CULTURE: Culture: >=100000 — AB

## 2022-11-02 MED ORDER — INSULIN GLARGINE-YFGN 100 UNIT/ML ~~LOC~~ SOLN
12.0000 [IU] | Freq: Every day | SUBCUTANEOUS | Status: DC
  Administered 2022-11-03: 12 [IU] via SUBCUTANEOUS
  Filled 2022-11-02 (×2): qty 0.12

## 2022-11-02 NOTE — Inpatient Diabetes Management (Signed)
Inpatient Diabetes Program Recommendations  AACE/ADA: New Consensus Statement on Inpatient Glycemic Control (2015)  Target Ranges:  Prepandial:   less than 140 mg/dL      Peak postprandial:   less than 180 mg/dL (1-2 hours)      Critically ill patients:  140 - 180 mg/dL   Lab Results  Component Value Date   GLUCAP 181 (H) 11/02/2022   HGBA1C 9.1 (A) 06/17/2022    Review of Glycemic Control  Latest Reference Range & Units 11/01/22 16:03 11/01/22 16:41 11/01/22 21:33 11/02/22 09:01  Glucose-Capillary 70 - 99 mg/dL 454 (H) 098 (H) 119 (H) 181 (H)  (H): Data is abnormally high Diabetes history: Type 2 DM Outpatient Diabetes medications: Lantus 8 units every day, Actos 45 mg QD Current orders for Inpatient glycemic control: Lantus 8 units every day, Novolog 0-9 units TID & HS  Inpatient Diabetes Program Recommendations:    Consider increasing Semglee 12 units every day and changing diet to carb modified.   Thanks, Lujean Rave, MSN, RNC-OB Diabetes Coordinator [phone] (8a-5p)

## 2022-11-02 NOTE — Consult Note (Signed)
Value-Based Care Institute  [facility] Inpatient Consult   11/02/2022  [name] [name] [birth year] [id number]  [facility] [[facility]]  Accountable Care Organization [ACO] Patient: [employer]  Primary Care Provider:  Donita Brooks, MD with [facility]  Patient is newly active with [facility] [[facility]] Care Management for care coordination services.  Patient has been engaged by a [employer] prior to admission.  The community based plan of care has focused on disease management and community resource support.  Previously admitted with UTI with an indwelling urinary catheter.  On unit rounds noted patient oriented to self only and has 24 hour caregivers per progress notes. Per notes wife is hospitalized as well  Plan: Continue to follow progress.  Follow up with Community RN team regarding post hospital follow up needs.    Of note, [facility] Care Management for community care coordination services does not replace or interfere with any services that are needed or arranged by inpatient [facility] care management team.   Charlesetta Shanks, RN, BSN, CCM [place]  [facility], [facility] Liaison Direct Dial: [phone] or secure chat Website: [email or web address]

## 2022-11-02 NOTE — Progress Notes (Addendum)
PROGRESS NOTE    [name] [name]  GNF:[id number] DOB: Apr 27, 1940 DOA: 10/30/2022 PCP: Donita Brooks, MD    Brief Narrative:  [name] [name] is a 82 y.o. male with medical history significant of dementia, CVA, HTN, HLD, T2DM who presents at the advise of PCP for evaluation of sepsis and dehydration.    He presented to PCP for hospital follow up today and was ill-appearing and in sinus tachycardia up to 138. Wife reported delirium and confusion last night. (She became ill while in the hospital with him and is also being admitted today.)    He was hospitalized from 10/09/22-10/12/22 for sepsis secondary to Proteus mirabilis UTI from indwelling foley. Prior to that had hospitalization earlier in September with AKI secondary to bladder outlet obstruction and discharged on foley catheter.  Had follow up with urology Dr. Mena Goes on 10/7 and was recommended to continue foley for 2 weeks due to high risk for failure. He was continued on Tamsulosin and finasteride was added.    Assessment and Plan: Tachycardia-- ?Sepsis secondary to UTI (HCC) vs dehydration -presented with tachycardia and leukocytosis -urine culture with staph aureus- await sensitivities// blood cultures negative -previous had cultures positive for proteus mirabilis and resistant to nitrofurantion but pan-sensitive to others -this culture showing staph aureus-- spoke with ID, no joint swelling so likely a contaminant and does not need to be treated-- improved despite not being treated   History of repair of right hip joint -hx of ORIF on 09/01/22. Has not followed up with orthopedic due to repeated hospitalization. He is well past 30 days of his DVT prophylaxis. Will discontinue Eliquis.   History of CVA (cerebrovascular accident) -had questionable failure on aspirin and had intolerance to Plavix and Aggrenox. Not currently on any antiplatelet therapy.  Chronic indwelling Foley catheter -hospitalized for AKI secondary to bladder  outlet obstruction in early September and discharged on Foley. Has been following with urology. Last saw rology Dr. Mena Goes on 10/7 and was recommended to continue foley for 2 weeks due to high risk for failure. -foley catheter was exchanged in ED -Continue Tamsulosin and Finasteride.   Elevated troponin -Troponin of 27 and 20. Suspect more demand from urosepsis.   Metabolic acidosis -appears to be at baseline  Type 2 diabetes mellitus with hyperglycemia, with long-term current use of insulin (HCC) -Last A1c OF 9.1 in June -place on sensitive SSI  -resume lantus  Dementia without behavioral disturbance (HCC) -oriented only to self/place-- baseline per wife -continue memantine and donepezil   Hypertension -continue losartan but at lower dose  Not ambulatory at baseline-- has 24/7 caregivers   DVT prophylaxis: enoxaparin (LOVENOX) injection 40 mg Start: 10/30/22 2200    Code Status: Limited: Do not attempt resuscitation (DNR) -DNR-LIMITED -Do Not Intubate/DNI  Family Communication:  Updated wife Disposition Plan:  Level of care: Telemetry Medical Status is: Inpatient Remains inpatient appropriate     Consultants:  none   Subjective: Irritable this AM but will laugh and smile..  no SOB, no CP  Objective: Vitals:   11/01/22 1639 11/01/22 2134 11/02/22 0424 11/02/22 0759  BP: 118/65 116/85 (!) 121/53 (!) 116/51  Pulse: 84 92 84 86  Resp: 17 18 17 16   Temp: (!) 97 F (36.1 C) 98 F (36.7 C) 98.6 F (37 C)   TempSrc:  Oral    SpO2: 98% 98% 100% 100%  Weight:      Height:        Intake/Output Summary (Last 24 hours) at 11/02/2022  1313 Last data filed at 11/02/2022 0436 Gross per 24 hour  Intake --  Output 300 ml  Net -300 ml   Filed Weights   10/30/22 1421 10/30/22 2331  Weight: 55.8 kg 56 kg    Examination:    General: Appearance:    Thin male in no acute distress     Lungs:     respirations unlabored  Heart:    Normal heart rate.        Neurologic:   Awake, alert      Data Reviewed: I have personally reviewed following labs and imaging studies  CBC: Recent Labs  Lab 10/30/22 1439 10/30/22 1445 10/31/22 0253  WBC 12.2*  --  10.5  NEUTROABS 9.7*  --   --   HGB 14.3 15.6 11.3*  HCT 44.8 46.0 36.5*  MCV 92.4  --  94.3  PLT 340  --  238   Basic Metabolic Panel: Recent Labs  Lab 10/30/22 1439 10/30/22 1445 10/31/22 0253  NA 139 138 139  K 3.9 3.9 3.5  CL 95*  --  102  CO2 27  --  23  GLUCOSE 263*  --  138*  BUN 19  --  16  CREATININE 0.84  --  0.60*  CALCIUM 10.6*  --  9.6   GFR: Estimated Creatinine Clearance: 57.4 mL/min (A) (by C-G formula based on SCr of 0.6 mg/dL (L)). Liver Function Tests: Recent Labs  Lab 10/30/22 1439  AST 22  ALT 24  ALKPHOS 90  BILITOT 0.4  PROT 7.9  ALBUMIN 3.5   No results for input(s): "LIPASE", "AMYLASE" in the last 168 hours. No results for input(s): "AMMONIA" in the last 168 hours. Coagulation Profile: No results for input(s): "INR", "PROTIME" in the last 168 hours. Cardiac Enzymes: No results for input(s): "CKTOTAL", "CKMB", "CKMBINDEX", "TROPONINI" in the last 168 hours. BNP (last 3 results) No results for input(s): "PROBNP" in the last 8760 hours. HbA1C: No results for input(s): "HGBA1C" in the last 72 hours. CBG: Recent Labs  Lab 11/01/22 1603 11/01/22 1641 11/01/22 2133 11/02/22 0901 11/02/22 1201  GLUCAP 261* 238* 225* 181* 189*   Lipid Profile: No results for input(s): "CHOL", "HDL", "LDLCALC", "TRIG", "CHOLHDL", "LDLDIRECT" in the last 72 hours. Thyroid Function Tests: No results for input(s): "TSH", "T4TOTAL", "FREET4", "T3FREE", "THYROIDAB" in the last 72 hours. Anemia Panel: No results for input(s): "VITAMINB12", "FOLATE", "FERRITIN", "TIBC", "IRON", "RETICCTPCT" in the last 72 hours. Sepsis Labs: No results for input(s): "PROCALCITON", "LATICACIDVEN" in the last 168 hours.  Recent Results (from the past 240 hour(s))  Culture, blood  (routine x 2)     Status: None (Preliminary result)   Collection Time: 10/30/22  2:27 PM   Specimen: BLOOD LEFT FOREARM  Result Value Ref Range Status   Specimen Description BLOOD LEFT FOREARM  Final   Special Requests   Final    BOTTLES DRAWN AEROBIC AND ANAEROBIC Blood Culture results may not be optimal due to an excessive volume of blood received in culture bottles   Culture   Final    NO GROWTH 3 DAYS Performed at [facility], [address] [place], [place] [address]    Report Status PENDING  Incomplete  Culture, blood (routine x 2)     Status: None (Preliminary result)   Collection Time: 10/30/22  2:39 PM   Specimen: BLOOD RIGHT FOREARM  Result Value Ref Range Status   Specimen Description BLOOD RIGHT FOREARM  Final   Special Requests  Final    BOTTLES DRAWN AEROBIC AND ANAEROBIC Blood Culture results may not be optimal due to an excessive volume of blood received in culture bottles   Culture   Final    NO GROWTH 3 DAYS Performed at [facility], [address], [place], [place] [address]    Report Status PENDING  Incomplete  Resp panel by RT-PCR (RSV, Flu A&B, Covid)     Status: None   Collection Time: 10/30/22  2:42 PM   Specimen: Nasal Swab  Result Value Ref Range Status   SARS Coronavirus 2 by RT PCR NEGATIVE NEGATIVE Final   Influenza A by PCR NEGATIVE NEGATIVE Final   Influenza B by PCR NEGATIVE NEGATIVE Final    Comment: (NOTE) The Xpert Xpress SARS-CoV-2/FLU/RSV plus assay is intended as an aid in the diagnosis of influenza from Nasopharyngeal swab specimens and should not be used as a sole basis for treatment. Nasal washings and aspirates are unacceptable for Xpert Xpress SARS-CoV-2/FLU/RSV testing.  Fact Sheet for Patients: BloggerCourse.com  Fact Sheet for Healthcare Providers: SeriousBroker.it  This test is not yet approved or cleared by the [place] FDA and has been authorized for  detection and/or diagnosis of SARS-CoV-2 by FDA under an Emergency Use Authorization (EUA). This EUA will remain in effect (meaning this test can be used) for the duration of the COVID-19 declaration under Section 564(b)(1) of the Act, 21 U.S.C. section 360bbb-3(b)(1), unless the authorization is terminated or revoked.     Resp Syncytial Virus by PCR NEGATIVE NEGATIVE Final    Comment: (NOTE) Fact Sheet for Patients: BloggerCourse.com  Fact Sheet for Healthcare Providers: SeriousBroker.it  This test is not yet approved or cleared by the [place] FDA and has been authorized for detection and/or diagnosis of SARS-CoV-2 by FDA under an Emergency Use Authorization (EUA). This EUA will remain in effect (meaning this test can be used) for the duration of the COVID-19 declaration under Section 564(b)(1) of the Act, 21 U.S.C. section 360bbb-3(b)(1), unless the authorization is terminated or revoked.  Performed at [facility], [address] [place], [place] [address]   Urine Culture     Status: Abnormal   Collection Time: 10/30/22  3:32 PM   Specimen: Urine, Random  Result Value Ref Range Status   Specimen Description URINE, RANDOM  Final   Special Requests   Final    NONE Reflexed from [id number] Performed at [facility], [address], [place], [place] [address]    Culture (A)  Final    >=100,000 COLONIES/mL STAPHYLOCOCCUS AUREUS 30,000 COLONIES/mL PSEUDOMONAS AERUGINOSA    Report Status 11/02/2022 FINAL  Final   Organism ID, Bacteria STAPHYLOCOCCUS AUREUS (A)  Final   Organism ID, Bacteria PSEUDOMONAS AERUGINOSA (A)  Final      Susceptibility   Pseudomonas aeruginosa - MIC*    CEFTAZIDIME 4 SENSITIVE Sensitive     CIPROFLOXACIN <=0.25 SENSITIVE Sensitive     GENTAMICIN <=1 SENSITIVE Sensitive     IMIPENEM 1 SENSITIVE Sensitive     * 30,000 COLONIES/mL PSEUDOMONAS AERUGINOSA   Staphylococcus aureus - MIC*     CIPROFLOXACIN <=0.5 SENSITIVE Sensitive     GENTAMICIN <=0.5 SENSITIVE Sensitive     NITROFURANTOIN <=16 SENSITIVE Sensitive     OXACILLIN <=0.25 SENSITIVE Sensitive     TETRACYCLINE <=1 SENSITIVE Sensitive     VANCOMYCIN 1 SENSITIVE Sensitive     TRIMETH/SULFA <=10 SENSITIVE Sensitive     CLINDAMYCIN <=0.25 SENSITIVE Sensitive  RIFAMPIN <=0.5 SENSITIVE Sensitive     Inducible Clindamycin NEGATIVE Sensitive     LINEZOLID 2 SENSITIVE Sensitive     * >=100,000 COLONIES/mL STAPHYLOCOCCUS AUREUS         Radiology Studies: No results found.      Scheduled Meds:  Chlorhexidine Gluconate Cloth  6 each Topical Daily   donepezil  10 mg Oral QHS   enoxaparin (LOVENOX) injection  40 mg Subcutaneous Q24H   finasteride  5 mg Oral Daily   insulin aspart  0-9 Units Subcutaneous TID PC & HS   [START ON 11/03/2022] insulin glargine-yfgn  12 Units Subcutaneous Daily   lactose free nutrition  237 mL Oral BID BM   losartan  25 mg Oral Daily   memantine  10 mg Oral BID   rosuvastatin  40 mg Oral Daily   tamsulosin  0.4 mg Oral QPC supper   Continuous Infusions:     LOS: 3 days    Time spent: 45 minutes spent on chart review, discussion with nursing staff, consultants, updating family and interview/physical exam; more than 50% of that time was spent in counseling and/or coordination of care.    Joseph Art, DO [facility] Available via Epic secure chat 7am-7pm After these hours, please refer to coverage provider listed on [email or web address] 11/02/2022, 1:13 PM

## 2022-11-03 ENCOUNTER — Telehealth: Payer: Self-pay | Admitting: *Deleted

## 2022-11-03 DIAGNOSIS — A419 Sepsis, unspecified organism: Secondary | ICD-10-CM | POA: Diagnosis not present

## 2022-11-03 DIAGNOSIS — N390 Urinary tract infection, site not specified: Secondary | ICD-10-CM | POA: Diagnosis not present

## 2022-11-03 LAB — GLUCOSE, CAPILLARY
Glucose-Capillary: 118 mg/dL — ABNORMAL HIGH (ref 70–99)
Glucose-Capillary: 194 mg/dL — ABNORMAL HIGH (ref 70–99)
Glucose-Capillary: 334 mg/dL — ABNORMAL HIGH (ref 70–99)
Glucose-Capillary: 111 mg/dL — ABNORMAL HIGH (ref 70–99)

## 2022-11-03 MED ORDER — RISAQUAD PO CAPS
2.0000 | ORAL_CAPSULE | Freq: Every day | ORAL | Status: DC
  Administered 2022-11-03: 2 via ORAL
  Filled 2022-11-03: qty 2

## 2022-11-03 MED ORDER — MEMANTINE HCL 5 MG PO TABS
10.0000 mg | ORAL_TABLET | Freq: Every day | ORAL | Status: DC
  Administered 2022-11-03: 10 mg via ORAL
  Filled 2022-11-03: qty 2

## 2022-11-03 MED ORDER — LOSARTAN POTASSIUM 25 MG PO TABS: 25.0000 mg | ORAL_TABLET | Freq: Every day | ORAL | 0 refills | Status: DC

## 2022-11-03 MED ORDER — RISAQUAD PO CAPS: 2.0000 | ORAL_CAPSULE | Freq: Every day | ORAL | Status: DC

## 2022-11-03 NOTE — Plan of Care (Signed)

## 2022-11-03 NOTE — Telephone Encounter (Signed)
Patient's wife left a message. She states that she is sorry that his appointment was missed. Both she and [name] [name] were admitted to the hospital. It looks like he is still a patient. She is wanting to make another appointment for him, so that she can make transportation appointment. [phone].

## 2022-11-03 NOTE — TOC Initial Note (Signed)
Transition of Care (TOC) - Initial/Assessment Note   Spoke to patient's wife [name] [name] [phone] via phone.   Patient from home with her. She and family provide care.   Patient active with [facility] , they would like to continue services. Received orders , [name] [name] with [facility] aware and can continue services.   Wife requesting ambulance transport home. NCM confirmed address. NCM asked nurse to call wife and go over discharge instructions.   PTAR paperwork on chart. PTAR called.  Patient Details  Name: [name] [name] MRN: [id number] Date of Birth: [birth year]  Transition of Care [facility]) CM/SW Contact:    [name] Plan, RN Phone Number: 11/03/2022, 1:43 PM  Clinical Narrative:                   Expected Discharge [name]: Home w Home Health Services Barriers to Discharge: No Barriers Identified   Patient Goals and CMS Choice Patient states their goals for this hospitalization and ongoing recovery are:: home CMS Medicare.gov Compare Post Acute Care list provided to:: Patient Represenative (must comment) (wife [name] [name]) Choice offered to / list presented to : Spouse      Expected Discharge [name] and Services   Discharge Planning Services: CM Consult Post Acute Care Choice: Home Health Living arrangements for the past 2 months: Single Family Home Expected Discharge Date: 11/03/22               DME Arranged: N/A         HH Arranged: PT, RN HH Agency: [facility] Date [facility] Agency Contacted: 11/03/22 Time HH Agency Contacted: 1342 Representative spoke with at [facility] Agency: [name] [name]  Prior Living Arrangements/Services Living arrangements for the past 2 months: Single Family Home Lives with:: Spouse, Adult Children Patient language and need for interpreter reviewed:: Yes Do you feel safe going back to the place where you live?: Yes      Need for Family Participation in Patient Care: Yes (Comment) Care giver support system in place?: Yes (comment) Current home  services: DME Criminal Activity/Legal Involvement Pertinent to Current Situation/Hospitalization: No - Comment as needed  Activities of Daily Living   ADL Screening (condition at time of admission) Independently performs ADLs?: No Does the patient have a NEW difficulty with bathing/dressing/toileting/self-feeding that is expected to last >3 days?: No Does the patient have a NEW difficulty with getting in/out of bed, walking, or climbing stairs that is expected to last >3 days?: No Does the patient have a NEW difficulty with communication that is expected to last >3 days?: No Is the patient deaf or have difficulty hearing?: Yes Does the patient have difficulty seeing, even when wearing glasses/contacts?: No Does the patient have difficulty concentrating, remembering, or making decisions?: Yes  Permission Sought/Granted   Permission granted to share information with : Yes, Verbal Permission Granted  Share Information with NAME: wife [name] [name]           Emotional Assessment Appearance:: Appears stated age     Orientation: : Oriented to Self Alcohol / Substance Use: Not Applicable Psych Involvement: No (comment)  Admission diagnosis:  Sepsis secondary to UTI (HCC) [A41.9, N39.0] Urinary tract infection associated with indwelling urethral catheter, initial encounter (HCC) [Q59.563O, N39.0] Patient Active Problem List   Diagnosis Date Noted   Metabolic acidosis 10/30/2022   Elevated troponin 10/30/2022   Chronic indwelling Foley catheter 10/30/2022   History of CVA (cerebrovascular accident) 10/30/2022   History of repair of right hip joint 10/30/2022  Ambulatory dysfunction 10/28/2022   Incontinence of urine 10/15/2022   Acute cystitis with hematuria 10/12/2022   Sepsis secondary to UTI (HCC) 10/09/2022   Bladder outlet obstruction 09/14/2022   Hypoalbuminemia due to protein-calorie malnutrition (HCC) 09/14/2022   Leukocytosis 09/14/2022   Thrombocytosis 09/14/2022    Anemia of chronic disease 09/14/2022   Essential hypertension 09/14/2022   Type 2 diabetes mellitus with hyperglycemia, with long-term current use of insulin (HCC) 09/14/2022   AKI (acute kidney injury) (HCC) 09/14/2022   Acute kidney injury (HCC) 09/13/2022   Dehydration 08/31/2022   Dementia without behavioral disturbance (HCC) 08/31/2022   Fall at home, initial encounter 08/31/2022   Hip fracture (HCC) 08/30/2022   Vitamin D deficiency 05/17/2020   Cerebrovascular accident (CVA) (HCC) 05/17/2020   Carotid stenosis 06/15/2016   Carotid disease, bilateral (HCC) 04/12/2015   History of colonic polyps    Iron deficiency anemia 12/21/2013   Smoker 12/21/2013   Anxiety    Diabetes mellitus (HCC)    Diabetes mellitus type 2, uncomplicated (HCC)    Mixed hyperlipidemia    Hypertension    Cellulitis of foot, left 08/09/2010   PCP:  Donita Brooks, MD Pharmacy:   [facility] [phone] - [place], [place] - [address] SEC [address] [place] [address] Phone: [phone] Fax: [phone]     Social Determinants of Health (SDOH) Social History: SDOH Screenings   Food Insecurity: Patient Unable To Answer (10/30/2022)  Housing: Patient Unable To Answer (10/09/2022)  Transportation Needs: Patient Unable To Answer (10/30/2022)  Utilities: Patient Unable To Answer (10/30/2022)  Alcohol Screen: Low Risk  (11/05/2021)  Depression (PHQ2-9): High Risk (11/05/2021)  Financial Resource Strain: Low Risk  (11/05/2021)  Physical Activity: Insufficiently Active (11/05/2021)  Social Connections: Moderately Integrated (11/05/2021)  Stress: No Stress Concern Present (11/05/2021)  Tobacco Use: Medium Risk (10/31/2022)   SDOH Interventions:     Readmission Risk Interventions    10/10/2022    4:39 PM  Readmission Risk Prevention [name]  Transportation Screening Complete  PCP or Specialist Appt within 3-5 Days Complete  HRI or Home  Care Consult Complete  Social Work Consult for Recovery Care Planning/Counseling Complete  Palliative Care Screening Not Applicable  Medication Review Oceanographer) Complete

## 2022-11-03 NOTE — Progress Notes (Signed)
Patient wife Trevar Marovich was called on phone, patient discharge instructions given to her

## 2022-11-03 NOTE — Discharge Summary (Signed)
Physician Discharge Summary  [name] [name] YQI:[id number] DOB: 03/21/1940 DOA: 10/30/2022  PCP: Donita Brooks, MD  Admit date: 10/30/2022 Discharge date: 11/03/2022  Admitted From: home Discharge disposition: home   Recommendations for Outpatient Follow-Up:   Monitor for infection-- per ID most likely contaminant in urine culture Urology follow up for catheter removal   Discharge Diagnosis:   Principal Problem:   Sepsis secondary to UTI [facility]) Active Problems:   Hypertension   Dementia without behavioral disturbance (HCC)   Type 2 diabetes mellitus with hyperglycemia, with long-term current use of insulin (HCC)   Metabolic acidosis   Elevated troponin   Chronic indwelling Foley catheter   History of CVA (cerebrovascular accident)   History of repair of right hip joint    Discharge Condition: Improved.  Diet recommendation:  Regular.  Wound care: None.  Code status: Full.   History of Present Illness:   [name] [name] is a 82 y.o. male with medical history significant of dementia, CVA, HTN, HLD, T2DM who presents at the advise of PCP for evaluation of sepsis and dehydration.    He presented to PCP for hospital follow up today and was ill-appearing and in sinus tachycardia up to 138. Wife reported delirium and confusion last night. (She became ill while in the hospital with him and is also being admitted today.)    He was hospitalized from 10/09/22-10/12/22 for sepsis secondary to Proteus mirabilis UTI from indwelling foley. Prior to that had hospitalization earlier in September with AKI secondary to bladder outlet obstruction and discharged on foley catheter.  Had follow up with urology Dr. Mena Goes on 10/7 and was recommended to continue foley for 2 weeks due to high risk for failure. He was continue on Tamsulosin and finasteride was added.    On arrival to ED, he was afebrile, tachycardic HR of 126, BP 146/76.   Had leukocytosis of 12K.  BMP notable for  anion gap of 17 with normal CO2 27.  Creatinine of 0.84.  Hyperglycemia at 263.   Troponin mildly elevated at 27 and 20.   UA was grossly positive with large leukocyte, positive nitrate and few bacteria.  Negative PCR for flu/COVID/RSV.   Chest x-ray negative.   He was given 1 L of normal saline bolus and started on IV Ro   Hospital Course by Problem:   Tachycardia-- due dehydration -presented with tachycardia and leukocytosis -previous had cultures positive for proteus mirabilis and resistant to nitrofurantion but pan-sensitive to others -this culture showing staph aureus-- spoke with ID, no joint swelling and blood cultures negative so likely a contaminant and does not need to be treated-- improved despite not being treated -improved to baseline with IVF     History of repair of right hip joint -hx of ORIF on 09/01/22. Has not followed up with orthopedic due to repeated hospitalization. He is well past 30 days of his DVT prophylaxis. Will discontinue Eliquis.    History of CVA (cerebrovascular accident) -had questionable failure on aspirin and had intolerance to Plavix and Aggrenox. Not currently on any antiplatelet therapy.   Chronic indwelling Foley catheter -hospitalized for AKI secondary to bladder outlet obstruction in early September and discharged on Foley. Has been following with urology. Last saw rology Dr. Mena Goes on 10/7 and was recommended to continue foley for 2 weeks due to high risk for failure. -foley catheter was exchanged in ED -Continue Tamsulosin and Finasteride.    Elevated troponin -Troponin of 27 and 20. Suspect  more demand from urosepsis.    Metabolic acidosis -appears to be at baseline   Type 2 diabetes mellitus with hyperglycemia, with long-term current use of insulin (HCC) -Last A1c OF 9.1 in June -resume home meds   Dementia without behavioral disturbance (HCC) -oriented only to self/place-- baseline per wife -continue memantine and donepezil     Hypertension -continue losartan but at lower dose    Medical Consultants:   ID (phone)   Discharge Exam:   Vitals:   11/02/22 1948 11/03/22 0412  BP: (!) 124/52 122/68  Pulse: 87 99  Resp: 16 18  Temp: 98.3 F (36.8 C) 97.6 F (36.4 C)  SpO2: 100% 99%   Vitals:   11/02/22 0759 11/02/22 1619 11/02/22 1948 11/03/22 0412  BP: (!) 116/51 116/63 (!) 124/52 122/68  Pulse: 86 74 87 99  Resp: 16 16 16 18   Temp:   98.3 F (36.8 C) 97.6 F (36.4 C)  TempSrc:   Oral Oral  SpO2: 100% 99% 100% 99%  Weight:      Height:        General exam: Appears calm and comfortable.   The results of significant diagnostics from this hospitalization (including imaging, microbiology, ancillary and laboratory) are listed below for reference.     Procedures and Diagnostic Studies:   DG Chest Port 1 View  Result Date: 10/30/2022 CLINICAL DATA:  Shortness of breath. EXAM: PORTABLE CHEST 1 VIEW COMPARISON:  Chest radiograph dated October 09, 2022. FINDINGS: The heart size and mediastinal contours are within normal limits. Aortic calcification. Both lungs are clear. No pneumothorax or pleural effusion. No acute osseous abnormality. IMPRESSION: No acute cardiopulmonary findings. Electronically Signed   By: Hart Robinsons M.D.   On: 10/30/2022 16:36     Labs:   Basic Metabolic Panel: Recent Labs  Lab 10/30/22 1439 10/30/22 1445 10/31/22 0253  NA 139 138 139  K 3.9 3.9 3.5  CL 95*  --  102  CO2 27  --  23  GLUCOSE 263*  --  138*  BUN 19  --  16  CREATININE 0.84  --  0.60*  CALCIUM 10.6*  --  9.6   GFR Estimated Creatinine Clearance: 57.4 mL/min (A) (by C-G formula based on SCr of 0.6 mg/dL (L)). Liver Function Tests: Recent Labs  Lab 10/30/22 1439  AST 22  ALT 24  ALKPHOS 90  BILITOT 0.4  PROT 7.9  ALBUMIN 3.5   No results for input(s): "LIPASE", "AMYLASE" in the last 168 hours. No results for input(s): "AMMONIA" in the last 168 hours. Coagulation profile No  results for input(s): "INR", "PROTIME" in the last 168 hours.  CBC: Recent Labs  Lab 10/30/22 1439 10/30/22 1445 10/31/22 0253  WBC 12.2*  --  10.5  NEUTROABS 9.7*  --   --   HGB 14.3 15.6 11.3*  HCT 44.8 46.0 36.5*  MCV 92.4  --  94.3  PLT 340  --  238   Cardiac Enzymes: No results for input(s): "CKTOTAL", "CKMB", "CKMBINDEX", "TROPONINI" in the last 168 hours. BNP: Invalid input(s): "POCBNP" CBG: Recent Labs  Lab 11/02/22 1201 11/02/22 1620 11/02/22 2048 11/03/22 0808 11/03/22 1216  GLUCAP 189* 208* 121* 118* 194*   D-Dimer No results for input(s): "DDIMER" in the last 72 hours. Hgb A1c No results for input(s): "HGBA1C" in the last 72 hours. Lipid Profile No results for input(s): "CHOL", "HDL", "LDLCALC", "TRIG", "CHOLHDL", "LDLDIRECT" in the last 72 hours. Thyroid function studies No results for input(s): "TSH", "T4TOTAL", "T3FREE", "  THYROIDAB" in the last 72 hours.  Invalid input(s): "FREET3" Anemia work up No results for input(s): "VITAMINB12", "FOLATE", "FERRITIN", "TIBC", "IRON", "RETICCTPCT" in the last 72 hours. Microbiology Recent Results (from the past 240 hour(s))  Culture, blood (routine x 2)     Status: None (Preliminary result)   Collection Time: 10/30/22  2:27 PM   Specimen: BLOOD LEFT FOREARM  Result Value Ref Range Status   Specimen Description BLOOD LEFT FOREARM  Final   Special Requests   Final    BOTTLES DRAWN AEROBIC AND ANAEROBIC Blood Culture results may not be optimal due to an excessive volume of blood received in culture bottles   Culture   Final    NO GROWTH 4 DAYS Performed at [facility], [address] [place], [place] [address]    Report Status PENDING  Incomplete  Culture, blood (routine x 2)     Status: None (Preliminary result)   Collection Time: 10/30/22  2:39 PM   Specimen: BLOOD RIGHT FOREARM  Result Value Ref Range Status   Specimen Description BLOOD RIGHT FOREARM  Final   Special Requests   Final    BOTTLES  DRAWN AEROBIC AND ANAEROBIC Blood Culture results may not be optimal due to an excessive volume of blood received in culture bottles   Culture   Final    NO GROWTH 4 DAYS Performed at [facility], [address], [place], [place] [address]    Report Status PENDING  Incomplete  Resp panel by RT-PCR (RSV, Flu A&B, Covid)     Status: None   Collection Time: 10/30/22  2:42 PM   Specimen: Nasal Swab  Result Value Ref Range Status   SARS Coronavirus 2 by RT PCR NEGATIVE NEGATIVE Final   Influenza A by PCR NEGATIVE NEGATIVE Final   Influenza B by PCR NEGATIVE NEGATIVE Final    Comment: (NOTE) The Xpert Xpress SARS-CoV-2/FLU/RSV plus assay is intended as an aid in the diagnosis of influenza from Nasopharyngeal swab specimens and should not be used as a sole basis for treatment. Nasal washings and aspirates are unacceptable for Xpert Xpress SARS-CoV-2/FLU/RSV testing.  Fact Sheet for Patients: BloggerCourse.com  Fact Sheet for Healthcare Providers: SeriousBroker.it  This test is not yet approved or cleared by the [place] FDA and has been authorized for detection and/or diagnosis of SARS-CoV-2 by FDA under an Emergency Use Authorization (EUA). This EUA will remain in effect (meaning this test can be used) for the duration of the COVID-19 declaration under Section 564(b)(1) of the Act, 21 U.S.C. section 360bbb-3(b)(1), unless the authorization is terminated or revoked.     Resp Syncytial Virus by PCR NEGATIVE NEGATIVE Final    Comment: (NOTE) Fact Sheet for Patients: BloggerCourse.com  Fact Sheet for Healthcare Providers: SeriousBroker.it  This test is not yet approved or cleared by the [place] FDA and has been authorized for detection and/or diagnosis of SARS-CoV-2 by FDA under an Emergency Use Authorization (EUA). This EUA will remain in effect (meaning this test  can be used) for the duration of the COVID-19 declaration under Section 564(b)(1) of the Act, 21 U.S.C. section 360bbb-3(b)(1), unless the authorization is terminated or revoked.  Performed at [facility], [address], [place], [place] [address]   Urine Culture     Status: Abnormal   Collection Time: 10/30/22  3:32 PM   Specimen: Urine, Random  Result Value Ref Range Status   Specimen Description URINE, RANDOM  Final   Special  Requests   Final    NONE Reflexed from [id number] Performed at [facility], [address], [place], [place] [address]    Culture (A)  Final    >=100,000 COLONIES/mL STAPHYLOCOCCUS AUREUS 30,000 COLONIES/mL PSEUDOMONAS AERUGINOSA    Report Status 11/02/2022 FINAL  Final   Organism ID, Bacteria STAPHYLOCOCCUS AUREUS (A)  Final   Organism ID, Bacteria PSEUDOMONAS AERUGINOSA (A)  Final      Susceptibility   Pseudomonas aeruginosa - MIC*    CEFTAZIDIME 4 SENSITIVE Sensitive     CIPROFLOXACIN <=0.25 SENSITIVE Sensitive     GENTAMICIN <=1 SENSITIVE Sensitive     IMIPENEM 1 SENSITIVE Sensitive     * 30,000 COLONIES/mL PSEUDOMONAS AERUGINOSA   Staphylococcus aureus - MIC*    CIPROFLOXACIN <=0.5 SENSITIVE Sensitive     GENTAMICIN <=0.5 SENSITIVE Sensitive     NITROFURANTOIN <=16 SENSITIVE Sensitive     OXACILLIN <=0.25 SENSITIVE Sensitive     TETRACYCLINE <=1 SENSITIVE Sensitive     VANCOMYCIN 1 SENSITIVE Sensitive     TRIMETH/SULFA <=10 SENSITIVE Sensitive     CLINDAMYCIN <=0.25 SENSITIVE Sensitive     RIFAMPIN <=0.5 SENSITIVE Sensitive     Inducible Clindamycin NEGATIVE Sensitive     LINEZOLID 2 SENSITIVE Sensitive     * >=100,000 COLONIES/mL STAPHYLOCOCCUS AUREUS     Discharge Instructions:   Discharge Instructions     Diet general   Complete by: As directed    Discharge instructions   Complete by: As directed    Monitor for symptoms of developing infection   Increase activity slowly   Complete by: As directed    No wound care    Complete by: As directed       Allergies as of 11/03/2022       Reactions   Invokana [canagliflozin] Palpitations, Rash, Other (See Comments)   Rash, tachycardia,weight loss   Plavix [clopidogrel Bisulfate]    Upset stomach        Medication List     STOP taking these medications    apixaban 2.5 MG Tabs tablet Commonly known as: Eliquis       TAKE these medications    acetaminophen 325 MG tablet Commonly known as: TYLENOL Take 2 tablets (650 mg total) by mouth every 8 (eight) hours as needed for mild pain or moderate pain.   acidophilus Caps capsule Take 2 capsules by mouth daily.   B-D ULTRAFINE III SHORT PEN 31G X 8 MM Misc Generic drug: Insulin Pen Needle USE TO INJECT INSULIN EVERY DAY   Dexcom G7 Sensor Misc Inject 1 Application into the skin as directed. Change sensor every 10 days as directed.   Dialyvite Vitamin D 5000 125 MCG (5000 UT) capsule Generic drug: Cholecalciferol Take 5,000 Units by mouth daily.   donepezil 10 MG tablet Commonly known as: ARICEPT TAKE 1 TABLET(10 MG) BY MOUTH AT BEDTIME What changed: See the new instructions.   feeding supplement Liqd Take 1 Container by mouth 2 (two) times daily between meals.   finasteride 5 MG tablet Commonly known as: PROSCAR Take 1 tablet (5 mg total) by mouth daily.   HYDROcodone-acetaminophen 5-325 MG tablet Commonly known as: Norco Take 1 tablet by mouth every 6 (six) hours as needed for moderate pain. Ok to fill after 9/10   Lantus SoloStar 100 UNIT/ML Solostar Pen Generic drug: insulin glargine Inject 8 Units into the skin daily. What changed: when to take this   losartan 25 MG tablet Commonly known as: COZAAR Take  1 tablet (25 mg total) by mouth daily. Start taking on: November 04, 2022 What changed:  medication strength how much to take   memantine 10 MG tablet Commonly known as: NAMENDA TAKE 1 TABLET(10 MG) BY MOUTH TWICE DAILY What changed: See the new instructions.    nystatin powder Commonly known as: MYCOSTATIN/NYSTOP Apply topically 2 (two) times daily. Apply to groin area and scrotum   OneTouch Delica Plus Lancet33G Misc USE TO CHECK BLOOD SUGAR TWICE DAILY. Dx: E11.9.   OneTouch Verio test strip Generic drug: glucose blood USE TO TEST BLOOD SUGAR TWO TO THREE TIMES DAILY   OneTouch Verio w/Device Kit USE TO TEST BLOOD SUGAR LEVELS THREE TIMES DAILY   pioglitazone 45 MG tablet Commonly known as: ACTOS Take 1 tablet (45 mg total) by mouth daily.   rosuvastatin 40 MG tablet Commonly known as: CRESTOR TAKE 1 TABLET(40 MG) BY MOUTH DAILY What changed: See the new instructions.   tamsulosin 0.4 MG Caps capsule Commonly known as: FLOMAX Take 1 capsule (0.4 mg total) by mouth daily after supper.          Time coordinating discharge: 45 min  Signed:  Joseph Art DO  [facility] 11/03/2022, 2:24 PM

## 2022-11-04 LAB — CULTURE, BLOOD (ROUTINE X 2)
Culture: NO GROWTH
Culture: NO GROWTH

## 2022-11-04 NOTE — Telephone Encounter (Signed)
Noted  

## 2022-11-05 ENCOUNTER — Telehealth: Payer: Self-pay

## 2022-11-05 ENCOUNTER — Ambulatory Visit: Payer: PPO | Admitting: Urology

## 2022-11-05 DIAGNOSIS — N401 Enlarged prostate with lower urinary tract symptoms: Secondary | ICD-10-CM

## 2022-11-05 DIAGNOSIS — R339 Retention of urine, unspecified: Secondary | ICD-10-CM

## 2022-11-05 DIAGNOSIS — F0390 Unspecified dementia without behavioral disturbance: Secondary | ICD-10-CM

## 2022-11-05 DIAGNOSIS — R262 Difficulty in walking, not elsewhere classified: Secondary | ICD-10-CM

## 2022-11-05 DIAGNOSIS — Z978 Presence of other specified devices: Secondary | ICD-10-CM
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
## 2022-11-05 NOTE — Transitions of Care (Post Inpatient/ED Visit) (Signed)
11/05/2022  Name: [name] [name] MRN: [id number] DOB: March 22, 1940  Today's TOC FU Call Status: Today's TOC FU Call Status:: Successful TOC FU Call Completed TOC FU Call Complete Date: 11/05/22 Patient's Name and Date of Birth confirmed.  Transition Care Management Follow-up Telephone Call Date of Discharge: 11/04/22 Discharge Facility: [facility]) Type of Discharge: Inpatient Admission Primary Inpatient Discharge Diagnosis:: Sepsis How have you been since you were released from the hospital?: Better Any questions or concerns?: No  Items Reviewed: Did you receive and understand the discharge instructions provided?: Yes Medications obtained,verified, and reconciled?: Yes (Medications Reviewed) Any new allergies since your discharge?: No Dietary orders reviewed?: NA Do you have support at home?: Yes People in Home: spouse Name of Support/Comfort Primary Source: Rene Kocher  Medications Reviewed Today: Medications Reviewed Today     Reviewed by Redge Gainer, RN (Case Manager) on 11/05/22 at 1253  Med List Status: <None>   Medication Order Taking? Sig Documenting Provider Last Dose Status Informant  acetaminophen (TYLENOL) 325 MG tablet [id number] No Take 2 tablets (650 mg total) by mouth every 8 (eight) hours as needed for mild pain or moderate pain. Montez Morita, PA-C 10/29/2022 Active Spouse/Significant Other  acidophilus (RISAQUAD) CAPS capsule [id number]  Take 2 capsules by mouth daily. Joseph Art, DO  Active   B-D ULTRAFINE III SHORT PEN 31G X 8 MM MISC [id number] No USE TO INJECT INSULIN EVERY DAY Donita Brooks, MD Taking Active Spouse/Significant Other  Blood Glucose Monitoring Suppl [facility] VERIO) w/Device KIT [id number] No USE TO TEST BLOOD SUGAR LEVELS THREE TIMES DAILY Dorena Bodo, PA-C Taking Active Spouse/Significant Other  Cholecalciferol (DIALYVITE VITAMIN D 5000) 125 MCG (5000 UT) capsule [id number] No Take 5,000 Units by mouth daily. [provider] 10/30/2022 Active Spouse/Significant Other  Continuous Glucose Sensor (DEXCOM G7 SENSOR) MISC [id number] No Inject 1 Application into the skin as directed. Change sensor every 10 days as directed. Dani Gobble, NP Taking Active Spouse/Significant Other  donepezil (ARICEPT) 10 MG tablet [id number] No TAKE 1 TABLET(10 MG) BY MOUTH AT BEDTIME  Patient taking differently: Take 10 mg by mouth at bedtime.   Donita Brooks, MD 10/29/2022 Active Spouse/Significant Other  feeding supplement (BOOST HIGH PROTEIN) LIQD [id number] No Take 1 Container by mouth 2 (two) times daily between meals. [provider] 10/30/2022 Active   finasteride (PROSCAR) 5 MG tablet [id number] No Take 1 tablet (5 mg total) by mouth daily. Jerilee Field, MD 10/30/2022 0730 Active   HYDROcodone-acetaminophen (NORCO) 5-325 MG tablet [id number] No Take 1 tablet by mouth every 6 (six) hours as needed for moderate pain. Ok to fill after 9/10 Donita Brooks, MD 10/29/2022 Active   insulin glargine (LANTUS SOLOSTAR) 100 UNIT/ML Solostar Pen [id number] No Inject 8 Units into the skin daily.  Patient taking differently: Inject 8 Units into the skin daily after supper.   [name], MD 10/29/2022 Active Spouse/Significant Other  Lancets (ONETOUCH DELICA PLUS LANCET33G) MISC [id number] No USE TO CHECK BLOOD SUGAR TWICE DAILY. Dx: E11.9. Donita Brooks, MD Taking Active Spouse/Significant Other  losartan (COZAAR) 25 MG tablet [id number]  Take 1 tablet (25 mg total) by mouth daily. Joseph Art, DO  Active   memantine (NAMENDA) 10 MG tablet [id number] No TAKE 1 TABLET(10 MG) BY MOUTH TWICE DAILY  Patient taking differently: Take 10 mg by mouth 2 (two) times daily.   Donita Brooks, MD 10/30/2022 Active Spouse/Significant Other  nystatin (MYCOSTATIN/NYSTOP) powder [id number] No Apply topically 2 (  two) times daily. Apply to groin area and scrotum [name], MD 10/30/2022 Active Spouse/Significant Other   ONETOUCH VERIO test strip [id number] No USE TO TEST BLOOD SUGAR TWO TO THREE TIMES DAILY Donita Brooks, MD Taking Active Spouse/Significant Other  pioglitazone (ACTOS) 45 MG tablet [id number] No Take 1 tablet (45 mg total) by mouth daily. Dani Gobble, NP 10/30/2022 Active Spouse/Significant Other           Med Note Erin Fulling, MEGAN   Mon Aug 31, 2022  9:22 AM)    rosuvastatin (CRESTOR) 40 MG tablet [id number] No TAKE 1 TABLET(40 MG) BY MOUTH DAILY  Patient taking differently: Take 40 mg by mouth daily.   Donita Brooks, MD 10/30/2022 Active Spouse/Significant Other  tamsulosin (FLOMAX) 0.4 MG CAPS capsule [id number] No Take 1 capsule (0.4 mg total) by mouth daily after supper. Vassie Loll, MD 10/29/2022 Active             Home Care and Equipment/Supplies: Were Home Health Services Ordered?: Yes Name of Home Health Agency:: [facility] Has Agency set up a time to come to your home?: No EMR reviewed for Home Health Orders: Orders present/patient has not received call (refer to CM for follow-up) Any new equipment or medical supplies ordered?: NA  Functional Questionnaire: Do you need assistance with bathing/showering or dressing?: Yes Do you need assistance with meal preparation?: Yes Do you need assistance with eating?: No Do you have difficulty maintaining continence: No Do you need assistance with getting out of bed/getting out of a chair/moving?: No Do you have difficulty managing or taking your medications?: Yes  Follow up appointments reviewed: PCP Follow-up appointment confirmed?: NA Specialist Hospital Follow-up appointment confirmed?: Yes Date of Specialist follow-up appointment?: 11/16/22 Follow-Up Specialty Provider:: Evette Georges Do you need transportation to your follow-up appointment?: No Do you understand care options if your condition(s) worsen?: Yes-patient verbalized understanding  SDOH Interventions Today    Flowsheet Row Most Recent Value  SDOH  Interventions   Food Insecurity Interventions Intervention Not Indicated  Transportation Interventions Intervention Not Indicated      The phone call was cut short twice by the patient's spouse. She was yelling at someone who had come to the front door and CM stayed on the phone for five minutes waiting for her to come back. She seems flustered with everything going on at home and is waiting for [facility] to call and set up the resumption of care. The patient continues with the foley catheter and is on antibiotics. He returns to clinic 11/16/2022 for a voiding trial. The patient is involved with Complex Case Management and TOC will warm hand off to them due to the spouse is a bit overwhelmed with new programs.   Deidre Ala, RN Medical illustrator VBCI-Population Health [phone]

## 2022-11-06 ENCOUNTER — Ambulatory Visit: Payer: Self-pay | Admitting: *Deleted

## 2022-11-06 NOTE — Patient Outreach (Signed)
  Care Coordination   11/06/2022 Name: [name] [name] MRN: [id number] DOB: January 23, 1940   Care Coordination Outreach Attempts:  An unsuccessful telephone outreach was attempted today to offer the patient information about available care coordination services.  Follow Up Plan:  Additional outreach attempts will be made to offer the patient care coordination information and services.   Encounter Outcome:  No Answer   Care Coordination Interventions:  No, not indicated     Kumiko Fishman L. Noelle Penner, RN, BSN, [facility]  VBCI Care Management Coordinator  [phone]  Fax: [phone]

## 2022-11-06 NOTE — Patient Instructions (Signed)
Visit Information  Thank you for taking time to visit with me today. Please don't hesitate to contact me if I can be of assistance to you.   Following are the goals we discussed today:   Goals Addressed             This Visit's Progress    Obtain transportation to appointments- care coordination services   Not on track    Interventions Today    Flowsheet Row Most Recent Value  Chronic Disease   Chronic disease during today's visit Diabetes, Other  [transportation, Memory concerns [facility], hearing]  General Interventions   General Interventions Discussed/Reviewed General Interventions Discussed, Communication with  Communication with PCP/Specialists  [[facility] staff]  Exercise Interventions   Exercise Discussed/Reviewed Exercise Discussed, Physical Activity, Assistive device use and maintanence  Physical Activity Discussed/Reviewed Physical Activity Discussed, Home Exercise Program (HEP)  Education Interventions   Education Provided Provided Education  Provided Verbal Education On Community Resources  [[facility]]  Mental Health Interventions   Mental Health Discussed/Reviewed Mental Health Discussed  Pharmacy Interventions   Pharmacy Dicussed/Reviewed Pharmacy Topics Discussed  Safety Interventions   Safety Discussed/Reviewed Safety Discussed  Advanced Directive Interventions   Advanced Directives Discussed/Reviewed Advanced Directives Discussed              Our next appointment is by telephone on 11/06/22 at 1015  Please call the care guide team at [phone] if you need to cancel or reschedule your appointment.   If you are experiencing a Mental Health or Behavioral Health Crisis or need someone to talk to, please call the Suicide and Crisis Lifeline: 988 call the [place] National Suicide Prevention Lifeline: 867-714-7722 or TTY: (518)670-6117 TTY (434)128-2674) to talk to a trained counselor call 1-800-273-TALK (toll free, 24 hour hotline) call the [facility]: 431-547-9520 call 911   No computer access, no preference for copy of AVS      The patient has been provided with contact information for the care management team and has been advised to call with any health related questions or concerns.   Hershel Corkery L. Noelle Penner, RN, BSN, [facility]  VBCI Care Management Coordinator  [phone]  Fax: [phone]

## 2022-11-09 ENCOUNTER — Ambulatory Visit: Payer: PPO | Admitting: Urology

## 2022-11-13 DIAGNOSIS — S72009A Fracture of unspecified part of neck of unspecified femur, initial encounter for closed fracture: Secondary | ICD-10-CM | POA: Diagnosis not present

## 2022-11-13 DIAGNOSIS — F0390 Unspecified dementia without behavioral disturbance: Secondary | ICD-10-CM | POA: Diagnosis not present

## 2022-11-16 ENCOUNTER — Ambulatory Visit (INDEPENDENT_AMBULATORY_CARE_PROVIDER_SITE_OTHER): Payer: PPO | Admitting: Urology

## 2022-11-16 ENCOUNTER — Encounter: Payer: Self-pay | Admitting: Urology

## 2022-11-16 VITALS — BP 120/69 | HR 106 | Temp 97.9°F | Ht 72.0 in | Wt 123.5 lb

## 2022-11-16 DIAGNOSIS — Z978 Presence of other specified devices: Secondary | ICD-10-CM

## 2022-11-16 DIAGNOSIS — Z8673 Personal history of transient ischemic attack (TIA), and cerebral infarction without residual deficits: Secondary | ICD-10-CM

## 2022-11-16 DIAGNOSIS — F0390 Unspecified dementia without behavioral disturbance: Secondary | ICD-10-CM

## 2022-11-16 DIAGNOSIS — N320 Bladder-neck obstruction: Secondary | ICD-10-CM

## 2022-11-16 DIAGNOSIS — R339 Retention of urine, unspecified: Secondary | ICD-10-CM | POA: Diagnosis not present

## 2022-11-16 DIAGNOSIS — N138 Other obstructive and reflux uropathy: Secondary | ICD-10-CM

## 2022-11-16 DIAGNOSIS — R262 Difficulty in walking, not elsewhere classified: Secondary | ICD-10-CM

## 2022-11-16 MED ORDER — CIPROFLOXACIN HCL 500 MG PO TABS
500.0000 mg | ORAL_TABLET | Freq: Once | ORAL | Status: AC
  Administered 2022-11-16: 500 mg via ORAL

## 2022-11-16 NOTE — Progress Notes (Signed)
Fill and Pull Catheter Removal  Patient is present today for a catheter removal.  Patient was cleaned and prepped in a sterile fashion of sterile water/ saline was instilled into the bladder when the patient felt the urge to urinate. 10ml of water was then drained from the balloon.  A 16FR foley cath was removed from the bladder no complications were noted .  Patient as then given some time to void on their own.  Patient cannot void  0ml on their own after some time.  Patient tolerated well.  Performed by: Guss Bunde, CMA  Follow up/ Additional notes: NP to see after   Simple Catheter Placement  Due to urinary retention patient is present today for a foley cath placement.  Patient was cleaned and prepped in a sterile fashion with betadine and 2% lidocaine jelly was instilled into the urethra. A 16 FR foley catheter was inserted, urine return was noted  , urine was clear in color.  The balloon was filled with 10cc of sterile water.  A bed bag was attached for drainage. Patient was also given a night bag to take home and was given instruction on how to change from one bag to another.  Patient was given instruction on proper catheter care.  Patient tolerated well, no complications were noted   Performed by: Guss Bunde, CMA  Additional notes/ Follow up: Return for 1st available cystoscopy with any urology MD.

## 2022-11-18 ENCOUNTER — Telehealth: Payer: Self-pay | Admitting: Family Medicine

## 2022-11-19 ENCOUNTER — Other Ambulatory Visit: Payer: Self-pay | Admitting: Family Medicine

## 2022-11-19 DIAGNOSIS — E08649 Diabetes mellitus due to underlying condition with hypoglycemia without coma: Secondary | ICD-10-CM | POA: Diagnosis not present

## 2022-11-19 MED ORDER — HYDROCODONE-ACETAMINOPHEN 5-325 MG PO TABS: 1.0000 | ORAL_TABLET | Freq: Four times a day (QID) | ORAL | 0 refills | Status: DC | PRN

## 2022-11-19 NOTE — Telephone Encounter (Unsigned)
Copied from CRM [id number]. Topic: Clinical - Medication Refill >> Nov 19, 2022  8:14 AM Almira Coaster wrote: Most Recent Primary Care Visit:  Provider: Lynnea Ferrier T  Department: [facility]  Visit Type: HOSPITAL FU  Date: 10/30/2022  Medication: HYDROcodone-acetaminophen (NORCO) 5-325 MG tablet  Has the patient contacted their pharmacy? Yes (Agent: If no, request that the patient contact the pharmacy for the refill. If patient does not wish to contact the pharmacy document the reason why and proceed with request.) (Agent: If yes, when and what did the pharmacy advise?)  Is this the correct pharmacy for this prescription? Yes If no, delete pharmacy and type the correct one.  This is the patient's preferred pharmacy:  [id number] - [place], [place] - [name] [place] [address] Phone: [phone] Fax: [phone]   Has the prescription been filled recently? No  Is the patient out of the medication? Yes  Has the patient been seen for an appointment in the last year OR does the patient have an upcoming appointment? Yes  Can we respond through MyChart? No  Agent: Please be advised that Rx refills may take up to 3 business days. We ask that you follow-up with your pharmacy.

## 2022-11-20 ENCOUNTER — Other Ambulatory Visit: Payer: Self-pay | Admitting: Family Medicine

## 2022-11-20 DIAGNOSIS — L89312 Pressure ulcer of right buttock, stage 2: Secondary | ICD-10-CM | POA: Diagnosis not present

## 2022-11-20 DIAGNOSIS — L89320 Pressure ulcer of left buttock, unstageable: Secondary | ICD-10-CM | POA: Diagnosis not present

## 2022-11-20 DIAGNOSIS — L89322 Pressure ulcer of left buttock, stage 2: Secondary | ICD-10-CM | POA: Diagnosis not present

## 2022-11-26 ENCOUNTER — Telehealth: Payer: Self-pay

## 2022-11-26 NOTE — Telephone Encounter (Signed)
Spoke with [name] [name] and order has been faxed for Dr. Tanya Nones to sign. Mjp,lpn  Copied from CRM [id number]. Topic: Clinical - Prescription Issue >> Nov 26, 2022 11:36 AM Desma Mcgregor wrote: Reason for CRM: [name] [name] with [employer] calling to see if Dr. Tanya Nones can sign the order for DermaCleanse so that they can bill the Pt.CB# [id number].

## 2022-11-26 NOTE — Telephone Encounter (Signed)
Copied from CRM [id number]. Topic: Clinical - Prescription Issue >> Nov 26, 2022 11:36 AM Desma Mcgregor wrote: Reason for CRM: Noreene Larsson with [employer] calling to see if Dr. Tanya Nones can sign the order for DermaCleanse so that they can bill the Pt.CB# [phone].

## 2022-11-30 DIAGNOSIS — L89312 Pressure ulcer of right buttock, stage 2: Secondary | ICD-10-CM | POA: Diagnosis not present

## 2022-11-30 DIAGNOSIS — L89320 Pressure ulcer of left buttock, unstageable: Secondary | ICD-10-CM | POA: Diagnosis not present

## 2022-11-30 DIAGNOSIS — L89322 Pressure ulcer of left buttock, stage 2: Secondary | ICD-10-CM | POA: Diagnosis not present

## 2022-12-01 ENCOUNTER — Telehealth: Payer: Self-pay

## 2022-12-01 NOTE — Telephone Encounter (Signed)
Copied from CRM [id number]. Topic: General - Call Back - No Documentation >> Dec 01, 2022  4:14 PM Mosetta Putt H wrote: Reason for CRM: sarah needs to speak to nurse recommending new wound care orders

## 2022-12-07 ENCOUNTER — Telehealth: Payer: Self-pay

## 2022-12-07 DIAGNOSIS — L89322 Pressure ulcer of left buttock, stage 2: Secondary | ICD-10-CM | POA: Diagnosis not present

## 2022-12-07 DIAGNOSIS — L89320 Pressure ulcer of left buttock, unstageable: Secondary | ICD-10-CM | POA: Diagnosis not present

## 2022-12-07 DIAGNOSIS — L89312 Pressure ulcer of right buttock, stage 2: Secondary | ICD-10-CM | POA: Diagnosis not present

## 2022-12-07 NOTE — Telephone Encounter (Signed)
Copied from CRM [id number]. Topic: Clinical - Home Health Verbal Orders >> Dec 07, 2022 12:55 PM Hector Shade B wrote: Caller/Agency: Randolm Idol Number: [phone] Service Requested: Calling back never heard from nurse last week attempting to get wound care and send in prescription for [employer] low airloss mattress.  Frequency:  Any new concerns about the patient? Yes Paitent has new wounds

## 2022-12-08 ENCOUNTER — Ambulatory Visit: Payer: Self-pay | Admitting: *Deleted

## 2022-12-08 ENCOUNTER — Telehealth: Payer: Self-pay

## 2022-12-08 ENCOUNTER — Other Ambulatory Visit: Payer: Self-pay

## 2022-12-08 MED ORDER — LOSARTAN POTASSIUM 25 MG PO TABS: 25.0000 mg | ORAL_TABLET | Freq: Every day | ORAL | 0 refills | Status: DC

## 2022-12-08 NOTE — Telephone Encounter (Signed)
Msg from Edd Arbour, RN:  Spoke w/pt's wife, pt needs an order for wound care to [facility] + a prescription for [employer] for a low airloss mattress.  Pls advise?

## 2022-12-08 NOTE — Telephone Encounter (Signed)
Pt's wife, Rene Kocher, aware meds been sent to pharmacy.

## 2022-12-08 NOTE — Patient Outreach (Signed)
  Care Coordination   Follow Up Visit Note   12/09/2022 Late entry for 12/08/22 1003 Name: [name] [name] MRN: [id number] DOB: 13-Nov-1940  [name] [name] is a 82 y.o. year old male who sees [name], [name], MD for primary care. I spoke with Rene Kocher, the wife of  [name] by phone today.  What matters to the patients health and wellness today?  Expressed concern with calls to her pcp office that has not been addressed. She report calls to th office were for losartan, hydrocodone renewal due on the 12/7th,  status of [facility]. She states Engineer, technical sales have also called the pcp office related to an airflow mattress from [employer]  It has been a week She voices frustration with the office new answering service with non clinical staff Wound wife is now doing his dressing but is interested int [facility] services   1436 Spoke with Rene Kocher, wife who reports she had responses related to the renewal of medication, wound care an the air mattress. She voices her appreciation for RN  CM services rendered & follow up    Goals Addressed             This Visit's Progress    Obtain wound care, prescription renewal, air mattress- care coordination services       Patient's wife confirmed transportation resources have been provided- Goal complete Obtain wound care, prescription renewal & air mattress   Interventions Today    Flowsheet Row Most Recent Value  Chronic Disease   Chronic disease during today's visit Other  [prescrption renewals, wound care, Air mattress]  General Interventions   General Interventions Discussed/Reviewed General Interventions Reviewed, Programmer, applications, Communication with, Horticulturist, commercial (DME), Doctor Visits  Doctor Visits Discussed/Reviewed Doctor Visits Reviewed, PCP  Durable Medical Equipment (DME) Other  [air mattress]  PCP/Specialist Visits Compliance with follow-up visit  Communication with PCP/Specialists, RN  [employer] and in person  collaboration with pcp RN Blia & NP A Howard to work in Location manager of medication, wound care & air mattress]  Education Interventions   Education Provided --  [employer] guidelines for DME, wound care and medicines]  Provided Verbal Education On Medication, [employer], Mental Health/Coping with Illness, Other  Pharmacy Interventions   Pharmacy Dicussed/Reviewed Pharmacy Topics Reviewed, Medications and their functions, Affording Medications              SDOH assessments and interventions completed:  Yes     Care Coordination Interventions:  Yes, provided   Follow up plan: Follow up call scheduled for later on 12/08/22 then 12/23/22    Encounter Outcome:  Patient Visit Completed   Cala Bradford L. Noelle Penner, RN, BSN, [facility]  VBCI Care Management Coordinator  [phone]  Fax: [phone]

## 2022-12-09 NOTE — Patient Instructions (Addendum)
Visit Information  Thank you for taking time to visit with me today. Please don't hesitate to contact me if I can be of assistance to you.   Following are the goals we discussed today:   Goals Addressed             This Visit's Progress    Obtain wound care, prescription renewal, air mattress- care coordination services       Patient's wife confirmed transportation resources have been provided- Goal complete Obtain wound care, prescription renewal & air mattress   Interventions Today    Flowsheet Row Most Recent Value  Chronic Disease   Chronic disease during today's visit Other  [prescrption renewals, wound care, Air mattress]  General Interventions   General Interventions Discussed/Reviewed General Interventions Reviewed, Programmer, applications, Communication with, Horticulturist, commercial (DME), Doctor Visits  Doctor Visits Discussed/Reviewed Doctor Visits Reviewed, PCP  Durable Medical Equipment (DME) Other  [air mattress]  PCP/Specialist Visits Compliance with follow-up visit  Communication with PCP/Specialists, RN  [employer] and in person collaboration with pcp RN Blia & NP A Howard to work in renewal of medication, wound care & air mattress]  Education Interventions   Education Provided --  [employer] guidelines for DME, wound care and medicines]  Provided Verbal Education On Medication, [employer], Mental Health/Coping with Illness, Other  Pharmacy Interventions   Pharmacy Dicussed/Reviewed Pharmacy Topics Reviewed, Medications and their functions, Affording Medications              Our next appointment is by telephone on 12/23/22 at 1030  Please call the care guide team at [phone] if you need to cancel or reschedule your appointment.   If you are experiencing a Mental Health or Behavioral Health Crisis or need someone to talk to, please call the Suicide and Crisis Lifeline: 988 call the [place] National Suicide Prevention Lifeline: 817-775-9656 or TTY:  410-524-6004 TTY (480)232-6386) to talk to a trained counselor call 1-800-273-TALK (toll free, 24 hour hotline) call the [facility]: [phone] call 911   No computer access, no preference for copy of AVS      The patient has been provided with contact information for the care management team and has been advised to call with any health related questions or concerns.   Adarius Tigges L. Noelle Penner, RN, BSN, [facility]  VBCI Care Management Coordinator  [phone]  Fax: [phone]

## 2022-12-13 DIAGNOSIS — L89320 Pressure ulcer of left buttock, unstageable: Secondary | ICD-10-CM | POA: Diagnosis not present

## 2022-12-13 DIAGNOSIS — L89312 Pressure ulcer of right buttock, stage 2: Secondary | ICD-10-CM | POA: Diagnosis not present

## 2022-12-13 DIAGNOSIS — L89322 Pressure ulcer of left buttock, stage 2: Secondary | ICD-10-CM | POA: Diagnosis not present

## 2022-12-13 DIAGNOSIS — L89620 Pressure ulcer of left heel, unstageable: Secondary | ICD-10-CM | POA: Diagnosis not present

## 2022-12-14 ENCOUNTER — Other Ambulatory Visit: Payer: Self-pay

## 2022-12-14 ENCOUNTER — Other Ambulatory Visit: Payer: Self-pay | Admitting: Family Medicine

## 2022-12-14 ENCOUNTER — Other Ambulatory Visit: Payer: Self-pay | Admitting: "Endocrinology

## 2022-12-14 DIAGNOSIS — I639 Cerebral infarction, unspecified: Secondary | ICD-10-CM

## 2022-12-14 DIAGNOSIS — Z794 Long term (current) use of insulin: Secondary | ICD-10-CM

## 2022-12-14 DIAGNOSIS — L8990 Pressure ulcer of unspecified site, unspecified stage: Secondary | ICD-10-CM | POA: Insufficient documentation

## 2022-12-14 DIAGNOSIS — F0390 Unspecified dementia without behavioral disturbance: Secondary | ICD-10-CM

## 2022-12-14 DIAGNOSIS — E118 Type 2 diabetes mellitus with unspecified complications: Secondary | ICD-10-CM
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
## 2022-12-15 ENCOUNTER — Ambulatory Visit: Payer: PPO | Admitting: Urology

## 2022-12-15 VITALS — BP 99/62 | HR 142

## 2022-12-15 DIAGNOSIS — R339 Retention of urine, unspecified: Secondary | ICD-10-CM

## 2022-12-15 DIAGNOSIS — N138 Other obstructive and reflux uropathy: Secondary | ICD-10-CM

## 2022-12-15 DIAGNOSIS — F0390 Unspecified dementia without behavioral disturbance: Secondary | ICD-10-CM | POA: Diagnosis not present

## 2022-12-15 DIAGNOSIS — N401 Enlarged prostate with lower urinary tract symptoms: Secondary | ICD-10-CM | POA: Diagnosis not present

## 2022-12-15 DIAGNOSIS — Z978 Presence of other specified devices: Secondary | ICD-10-CM | POA: Diagnosis not present

## 2022-12-15 MED ORDER — CIPROFLOXACIN HCL 500 MG PO TABS: 500.0000 mg | ORAL_TABLET | Freq: Once | ORAL | Status: DC

## 2022-12-15 NOTE — Progress Notes (Signed)
Catheter Removal  Patient is present today for a catheter removal.  10 ml of water was drained from the balloon. A 16 FR foley cath was removed from the bladder, no complications were noted. Patient tolerated well.  Performed by: Kennyth Lose, CMA  Follow up/ Additional notes: No follow-ups on file.

## 2022-12-16 ENCOUNTER — Telehealth: Payer: Self-pay

## 2022-12-16 ENCOUNTER — Other Ambulatory Visit: Payer: Self-pay

## 2022-12-16 DIAGNOSIS — L8990 Pressure ulcer of unspecified site, unspecified stage: Secondary | ICD-10-CM

## 2022-12-16 NOTE — Telephone Encounter (Signed)
Sarah,RN w/[facility] call requesting for ointment for pt's pressure wound : Sentil Ointment ?   FYI: Per Sarah also request referral for wound care, per Maralyn Sago it looks like a stage 3, especially on the L-buttocks. Referall, placed.

## 2022-12-16 NOTE — Telephone Encounter (Addendum)
Spoke w/Arial w/[employer]: Per Arial, she does not show that they have a Rx fax for an air mattress?  If need to fax another Rx send to fax: [phone] Or call [phone]

## 2022-12-17 ENCOUNTER — Telehealth: Payer: Self-pay

## 2022-12-17 ENCOUNTER — Other Ambulatory Visit: Payer: Self-pay | Admitting: Family Medicine

## 2022-12-17 MED ORDER — HYDROCODONE-ACETAMINOPHEN 5-325 MG PO TABS: 1.0000 | ORAL_TABLET | Freq: Four times a day (QID) | ORAL | 0 refills | Status: DC | PRN

## 2022-12-17 NOTE — Telephone Encounter (Signed)
Copied from CRM [id number]. Topic: Clinical - Medication Refill >> Dec 17, 2022  9:06 AM Orinda Kenner C wrote: Most Recent Primary Care Visit:  Provider: Lynnea Ferrier T  Department: [facility]  Visit Type: HOSPITAL FU  Date: 10/30/2022  Medication: HYDROcodone-acetaminophen (NORCO) 5-325 MG tablet   Has the patient contacted their pharmacy? Yes, was instructed to contact the office.  (Agent: If no, request that the patient contact the pharmacy for the refill. If patient does not wish to contact the pharmacy document the reason why and proceed with request.) (Agent: If yes, when and what did the pharmacy advise?)  Is this the correct pharmacy for this prescription? Yes If no, delete pharmacy and type the correct one.  This is the patient's preferred pharmacy:  [id number] - [place], [place] - [name] [place] [address] Phone: [phone] Fax: [phone]   Has the prescription been filled recently?   Is the patient out of the medication? Yes, will not have rx tomorrow  Has the patient been seen for an appointment in the last year OR does the patient have an upcoming appointment?   Can we respond through MyChart? No, pls c/b [phone]  Agent: Please be advised that Rx refills may take up to 3 business days. We ask that you follow-up with your pharmacy.

## 2022-12-17 NOTE — Telephone Encounter (Signed)
Labs in date.    LOV 10/30/2022   Requested Prescriptions  Pending Prescriptions Disp Refills   memantine (NAMENDA) 10 MG tablet [Pharmacy Med Name: MEMANTINE 10MG  TABLETS] 180 tablet 3    Sig: TAKE 1 TABLET(10 MG) BY MOUTH TWICE DAILY     Neurology:  Alzheimer's Agents 2 Failed - 12/14/2022  9:25 AM      Failed - Cr in normal range and within 360 days    Creat  Date Value Ref Range Status  03/24/2022 0.85 0.70 - 1.22 mg/dL Final   Creatinine, Ser  Date Value Ref Range Status  10/31/2022 0.60 (L) 0.61 - 1.24 mg/dL Final         Failed - Valid encounter within last 6 months    Recent Outpatient Visits           2 years ago Primary hypertension   [facility] Donita Brooks, MD   3 years ago Type 2 diabetes mellitus without complication, with long-term current use of insulin (HCC)   [facility] [name], [name], MD   4 years ago Diabetes mellitus type 2 with complications, uncontrolled (HCC)   [facility] [name], [name], MD   4 years ago Diabetes mellitus type 2 with complications, uncontrolled (HCC)   [facility] [name], [name], MD   5 years ago Diabetes mellitus type 2 with complications, uncontrolled (HCC)   [facility] [name], [name], MD       Future Appointments             In 2 months Marcine Matar, MD [facility] [place]            Passed - eGFR is 5 or above and within 360 days    GFR, Est African American  Date Value Ref Range Status  05/17/2020 95 > OR = 60 mL/min/1.78m2 Final   GFR, Est Non African American  Date Value Ref Range Status  05/17/2020 82 > OR = 60 mL/min/1.67m2 Final   GFR, Estimated  Date Value Ref Range Status  10/31/2022 >60 >60 mL/min Final    Comment:    (NOTE) Calculated using the CKD-EPI Creatinine Equation (2021)    eGFR  Date Value Ref Range Status  03/24/2022 88 > OR = 60 mL/min/1.9m2 Final

## 2022-12-18 ENCOUNTER — Telehealth: Payer: Self-pay | Admitting: Family Medicine

## 2022-12-18 DIAGNOSIS — R339 Retention of urine, unspecified: Secondary | ICD-10-CM | POA: Diagnosis not present

## 2022-12-18 NOTE — Telephone Encounter (Signed)
Copied from CRM [id number]. Topic: Referral - Status >> Dec 17, 2022 11:08 AM Tiffany H wrote: Reason for CRM: Lupita Leash with [facility] called to request a face sheet to go with 12/16/22 Wound Care referral. Please send to the below fax:  Fax: [phone] Phone: [phone]  ext [phone]

## 2022-12-19 DIAGNOSIS — E08649 Diabetes mellitus due to underlying condition with hypoglycemia without coma: Secondary | ICD-10-CM | POA: Diagnosis not present

## 2022-12-22 ENCOUNTER — Other Ambulatory Visit: Payer: Self-pay

## 2022-12-22 ENCOUNTER — Telehealth: Payer: Self-pay

## 2022-12-22 DIAGNOSIS — L8990 Pressure ulcer of unspecified site, unspecified stage: Secondary | ICD-10-CM

## 2022-12-22 MED ORDER — SANTYL 250 UNIT/GM EX OINT: 1.0000 | TOPICAL_OINTMENT | Freq: Every day | CUTANEOUS | 1 refills | Status: DC

## 2022-12-22 NOTE — Telephone Encounter (Signed)
New order was faxed over to [employer] for gel air mattress. Wound care referral was sent over last week and additional information was sent yesterday. I have called Sarah with [facility] and advised of all.  Okay to send in the Sentil ointment? Thanks [email or web address]  Copied from CRM [id number]. Topic: Clinical - Medical Advice >> Dec 22, 2022 12:11 PM Fuller Mandril wrote: Reason for CRM: Maralyn Sago called from [facility] to provide an update for pt and check status on orders. Caller advised pt has a new wound on right hip and is requesting recommentdation for wound care. Does not want to change order but wanted to make provider aware that all the wounds are now one wound. Advised the referral has been submitted for wound care. Caller states she has called multiple time to request order for mattress and would like an update on the status of the order and to know if Sentil ointment will be prescribed for pt.  Call Back: [phone]  Thank You

## 2022-12-23 ENCOUNTER — Telehealth: Payer: Self-pay

## 2022-12-23 ENCOUNTER — Ambulatory Visit: Payer: Self-pay | Admitting: *Deleted
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
## 2022-12-23 NOTE — Telephone Encounter (Signed)
Pt's wife called and states the Santyl ointment is over $300.00. Is there a cheaper alternative? Thank you!

## 2022-12-24 DIAGNOSIS — S72009A Fracture of unspecified part of neck of unspecified femur, initial encounter for closed fracture: Secondary | ICD-10-CM | POA: Diagnosis not present

## 2022-12-24 NOTE — Patient Instructions (Signed)
Visit Information  Thank you for taking time to visit with me today. Please don't hesitate to contact me if I can be of assistance to you.   Following are the goals we discussed today:   Goals Addressed             This Visit's Progress    Obtain wound care, prescription renewal, air mattress- care coordination services   Not on track    Patient's wife confirmed transportation resources have been provided- 12/23/22 Will be changing insurance in 2025 to obtain a better transportation benefit + [facility] use Obtain wound care center appointment + new cost efficient cream, prescription renewal & air mattress -12/23/22 still pending change in cream & air mattress, wound care & endocrinology appointments scheduled during [facility] accessibility  Interventions Today    Flowsheet Row Most Recent Value  Chronic Disease   Chronic disease during today's visit Other  [wound care center appt,  cream (cost concern)  & supplies for wound, transportation while [facility] closed for holidays, bed & mattress from [facility]]  General Interventions   General Interventions Discussed/Reviewed General Interventions Reviewed, [employer], Communication with, Horticulturist, commercial (DME), Doctor Visits  Doctor Visits Discussed/Reviewed Doctor Visits Reviewed, PCP, Specialist  Durable Medical Equipment (DME) Other  [woound care supplies + cream, bed mattress]  PCP/Specialist Visits Compliance with follow-up visit  [wound care center has schedule patient for 12/30/22 wife made aware]  Communication with PCP/Specialists, RN  [In person outreach to [employer], Germaine Pomfret (wound care cream expense, mattress, wound care center, pharmacy).  outreach to [facility] (davida).  outreach to [facility] wound care Left message  updates to wife. Confirmed [facility] closed 01/05/23 to 01/14/23 except dialysis]  Exercise Interventions   Exercise Discussed/Reviewed Exercise Reviewed, Physical Activity, Assistive device use and maintanence  Physical Activity  Discussed/Reviewed Physical Activity Reviewed, Home Exercise Program (HEP)  Education Interventions   Education Provided Provided Education  Provided Verbal Education On Medication, Community Resources, Chief of Staff to change cream to cost efficient one, gentle care transportation or changing appointment to day that [facility] accessible, insurance transportation benefit only non emergency ambulance with prior authorization,]  Mental Health Interventions   Mental Health Discussed/Reviewed Mental Health Reviewed, Coping Strategies  [wife tearful during outreach as stressed related to her & patient's medical concerns Encouraged outreach to RN CM for care coordination No preference for counseling at this time]  Nutrition Interventions   Nutrition Discussed/Reviewed Nutrition Discussed, Fluid intake  Pharmacy Interventions   Pharmacy Dicussed/Reviewed Pharmacy Topics Reviewed, Medications and their functions, Affording Medications  Safety Interventions   Safety Discussed/Reviewed Safety Reviewed, Home Safety  Home Safety Assistive Devices              Our next appointment is by telephone on 01/04/23 at 0915  Please call the care guide team at [phone] if you need to cancel or reschedule your appointment.   If you are experiencing a Mental Health or Behavioral Health Crisis or need someone to talk to, please call the Suicide and Crisis Lifeline: 988 call the [place] National Suicide Prevention Lifeline: 631-206-4000 or TTY: 380-439-8466 TTY 250-288-0508) to talk to a trained counselor call 1-800-273-TALK (toll free, 24 hour hotline) call the [facility]: (516)532-7488   No computer access, no preference for copy of AVS     The patient has been provided with contact information for the care management team and has been advised to call with any health related questions or concerns.   General Wearing L.  Noelle Penner, RN, BSN, [facility]  VBCI Care Management Coordinator   [phone]  Fax: [phone]

## 2022-12-25 DIAGNOSIS — S72009A Fracture of unspecified part of neck of unspecified femur, initial encounter for closed fracture: Secondary | ICD-10-CM | POA: Diagnosis not present

## 2022-12-25 DIAGNOSIS — F0390 Unspecified dementia without behavioral disturbance: Secondary | ICD-10-CM | POA: Diagnosis not present

## 2022-12-29 ENCOUNTER — Telehealth: Payer: Self-pay

## 2022-12-29 NOTE — Telephone Encounter (Signed)
[facility] needs an new order for a low air loss mattress. Order faxed to [facility] at [place], [phone].  Spoke with pt's wife, Rene Kocher and she asks if there is anything else the patient can use for pain due to bedsore? Rene Kocher states patient has gotten to the point where he doesn't want to be turned because of the pain.

## 2022-12-30 ENCOUNTER — Telehealth: Payer: Self-pay | Admitting: *Deleted

## 2022-12-30 DIAGNOSIS — E119 Type 2 diabetes mellitus without complications: Secondary | ICD-10-CM | POA: Diagnosis not present

## 2022-12-30 DIAGNOSIS — Z794 Long term (current) use of insulin: Secondary | ICD-10-CM | POA: Diagnosis not present

## 2022-12-30 DIAGNOSIS — Z7984 Long term (current) use of oral hypoglycemic drugs: Secondary | ICD-10-CM | POA: Diagnosis not present

## 2022-12-30 DIAGNOSIS — K219 Gastro-esophageal reflux disease without esophagitis: Secondary | ICD-10-CM | POA: Diagnosis not present

## 2022-12-30 DIAGNOSIS — Z8673 Personal history of transient ischemic attack (TIA), and cerebral infarction without residual deficits: Secondary | ICD-10-CM | POA: Diagnosis not present

## 2022-12-30 DIAGNOSIS — F0390 Unspecified dementia without behavioral disturbance: Secondary | ICD-10-CM | POA: Diagnosis not present

## 2022-12-30 DIAGNOSIS — M1990 Unspecified osteoarthritis, unspecified site: Secondary | ICD-10-CM | POA: Diagnosis not present

## 2022-12-30 DIAGNOSIS — L89154 Pressure ulcer of sacral region, stage 4: Secondary | ICD-10-CM | POA: Diagnosis not present

## 2022-12-30 DIAGNOSIS — R0689 Other abnormalities of breathing: Secondary | ICD-10-CM | POA: Diagnosis not present

## 2022-12-30 DIAGNOSIS — Z96641 Presence of right artificial hip joint: Secondary | ICD-10-CM | POA: Diagnosis not present

## 2022-12-30 DIAGNOSIS — E785 Hyperlipidemia, unspecified: Secondary | ICD-10-CM | POA: Diagnosis not present

## 2022-12-30 DIAGNOSIS — Z792 Long term (current) use of antibiotics: Secondary | ICD-10-CM | POA: Diagnosis not present

## 2022-12-30 DIAGNOSIS — I10 Essential (primary) hypertension: Secondary | ICD-10-CM | POA: Diagnosis not present

## 2022-12-30 DIAGNOSIS — N400 Enlarged prostate without lower urinary tract symptoms: Secondary | ICD-10-CM | POA: Diagnosis not present

## 2022-12-30 DIAGNOSIS — L89329 Pressure ulcer of left buttock, unspecified stage: Secondary | ICD-10-CM | POA: Insufficient documentation

## 2022-12-30 DIAGNOSIS — Z79899 Other long term (current) drug therapy: Secondary | ICD-10-CM | POA: Diagnosis not present

## 2022-12-30 DIAGNOSIS — R000 Tachycardia, unspecified: Secondary | ICD-10-CM | POA: Diagnosis not present

## 2022-12-30 DIAGNOSIS — Z79891 Long term (current) use of opiate analgesic: Secondary | ICD-10-CM | POA: Diagnosis not present

## 2022-12-30 NOTE — Telephone Encounter (Signed)
Patient's wife presented to the office with the patient's CGM. Patient has been in the hospital for fractured hip and  now has a bad place(ulcer) per wife. They are waiting for this to be debride.Patient's blood sugars have been to hight for this to take place. He is to go tomorrow and the blood sugar has to be below 400  before this will be a possibility. Patient's wife injecting Lantus 12 units at night, and he is taking the Actos 45 mg in the morning.It was prescribed that he inject 10 units. Wife states that he is not eating very much at all, he is drinking a lot of fluids. He is sleeping a lot.  From his last office visit, this is the recommendations by Whitney. He is advised to continue his Lantus to 10 units SQ in the morning (was once taking it at night and dropped him dangerously low) and Actos 45 mg po daily at breakfast.  Will stop Metformin today to see if diarrhea improves further.    I addressed this with Dr. Fransico Him. He ask that the patient increase the Lantus to 20 units starting tonight, patient should notice a decrease in the blood sugar readings tomorrow, if in three days a change is not noted, they should call back for another adjustment. Patient's wife given these instructions and advised that this would be addressed with Whitney and any further recommendations given , I would call her tomorrow,12/31/2022 and let her know. A copy of the CGM report was printed out and ready for Whitney's review.

## 2022-12-31 DIAGNOSIS — N401 Enlarged prostate with lower urinary tract symptoms: Secondary | ICD-10-CM | POA: Diagnosis not present

## 2022-12-31 DIAGNOSIS — K219 Gastro-esophageal reflux disease without esophagitis: Secondary | ICD-10-CM | POA: Diagnosis not present

## 2022-12-31 DIAGNOSIS — I69318 Other symptoms and signs involving cognitive functions following cerebral infarction: Secondary | ICD-10-CM | POA: Diagnosis not present

## 2022-12-31 DIAGNOSIS — I10 Essential (primary) hypertension: Secondary | ICD-10-CM | POA: Diagnosis not present

## 2022-12-31 DIAGNOSIS — L89154 Pressure ulcer of sacral region, stage 4: Secondary | ICD-10-CM | POA: Diagnosis not present

## 2022-12-31 DIAGNOSIS — Z96641 Presence of right artificial hip joint: Secondary | ICD-10-CM | POA: Diagnosis not present

## 2022-12-31 DIAGNOSIS — Z7984 Long term (current) use of oral hypoglycemic drugs: Secondary | ICD-10-CM | POA: Diagnosis not present

## 2022-12-31 DIAGNOSIS — L89159 Pressure ulcer of sacral region, unspecified stage: Secondary | ICD-10-CM | POA: Diagnosis not present

## 2022-12-31 DIAGNOSIS — E785 Hyperlipidemia, unspecified: Secondary | ICD-10-CM | POA: Diagnosis not present

## 2022-12-31 DIAGNOSIS — R338 Other retention of urine: Secondary | ICD-10-CM | POA: Diagnosis not present

## 2022-12-31 DIAGNOSIS — F0390 Unspecified dementia without behavioral disturbance: Secondary | ICD-10-CM | POA: Diagnosis not present

## 2022-12-31 DIAGNOSIS — Z79899 Other long term (current) drug therapy: Secondary | ICD-10-CM | POA: Diagnosis not present

## 2022-12-31 DIAGNOSIS — Z794 Long term (current) use of insulin: Secondary | ICD-10-CM | POA: Diagnosis not present

## 2022-12-31 DIAGNOSIS — M1990 Unspecified osteoarthritis, unspecified site: Secondary | ICD-10-CM | POA: Diagnosis not present

## 2022-12-31 DIAGNOSIS — L89329 Pressure ulcer of left buttock, unspecified stage: Secondary | ICD-10-CM | POA: Diagnosis not present

## 2022-12-31 DIAGNOSIS — Z89021 Acquired absence of right finger(s): Secondary | ICD-10-CM | POA: Diagnosis not present

## 2022-12-31 DIAGNOSIS — E119 Type 2 diabetes mellitus without complications: Secondary | ICD-10-CM | POA: Diagnosis not present

## 2022-12-31 DIAGNOSIS — Z89022 Acquired absence of left finger(s): Secondary | ICD-10-CM | POA: Diagnosis not present

## 2022-12-31 NOTE — Telephone Encounter (Signed)
Talked with patient's wife and shared [name] [name] was in agreement with the care plan. Patient states that his blood sugar was down to 240 this morning so they are planning to do the debridement.She will call us with a update on Monday.

## 2022-12-31 NOTE — Telephone Encounter (Signed)
I reviewed his CGM report and agree with Dr. Fransico Him.  Hopefully they will start seeing improvement in glucose but it may still need a few more adjustments.

## 2023-01-03 ENCOUNTER — Other Ambulatory Visit: Payer: Self-pay | Admitting: Family Medicine

## 2023-01-04 ENCOUNTER — Telehealth: Payer: Self-pay

## 2023-01-04 ENCOUNTER — Ambulatory Visit: Payer: Self-pay | Admitting: *Deleted

## 2023-01-04 DIAGNOSIS — L89322 Pressure ulcer of left buttock, stage 2: Secondary | ICD-10-CM | POA: Diagnosis not present

## 2023-01-04 DIAGNOSIS — L89312 Pressure ulcer of right buttock, stage 2: Secondary | ICD-10-CM | POA: Diagnosis not present

## 2023-01-04 DIAGNOSIS — L89620 Pressure ulcer of left heel, unstageable: Secondary | ICD-10-CM | POA: Diagnosis not present

## 2023-01-04 DIAGNOSIS — L89320 Pressure ulcer of left buttock, unstageable: Secondary | ICD-10-CM | POA: Diagnosis not present

## 2023-01-04 DIAGNOSIS — L89610 Pressure ulcer of right heel, unstageable: Secondary | ICD-10-CM | POA: Diagnosis not present

## 2023-01-04 NOTE — Telephone Encounter (Signed)
Order for low air loss mattress sent to [facility] through [employer]. Mjp,lpn

## 2023-01-04 NOTE — Patient Outreach (Addendum)
Care Coordination   Follow Up Visit Note   01/04/2023 Name: [name] [name] MRN: [id number] DOB: March 05, 1940  [name] [name] is a 82 y.o. year old male who sees [name], [name] Heidelberg, MD for primary care. I spoke with wife, [name] [name] of [name] [name] by phone today.  What matters to the patients health and wellness today?  [name] [name], Equipment pending - low air flow mattress  [name] [name] reports she and [name] [name] are doing better.  She confirms resolutions with transportation, wound cream, [facility] & the rescheduling of his endocrinology appointment to 01/15/23 [facility] home health staff still active She was able to use [facility] ambulance services to get [name] [name] to the wound care center. She is aware that she can continue to use this non emergency transportation resource- covered by his medicaid. [facility] now has the medicaid coverage information  He did have debridement on 12/31/22. Wounds are on left buttocks (pressure), stage IV of sacral area. The wound dressing treatment cream was discontinued.  [name] [name] is now used sparsely. Will need more. Permission given to RN CM to find out how she could obtain more Dakins and follow up on delivery of mattress   Goals Addressed             This Visit's Progress    Obtain wound care suplies, Dakins solutions, air mattress- care coordination services   On track    Patient's wife confirmed transportation resources have been provided- 01/04/23 Resolved recently used [facility] -access to [facility] after holidays Obtain wound care center appointment + new cost efficient cream, prescription renewal & air mattress -01/04/23 discontinued cream & air mattress still pending, [facility] center visit completed  & endocrinology appointment rescheduled to 01/15/23 Will have access to Dakins [name] for wound care as needed from wound care center or local drug store ([facility], [facility], [facility])- 01/04/23 doing well at this time and aware of resources to obtain  Dakins  Interventions Today    Flowsheet Row Most Recent Value  Chronic Disease   Chronic disease during today's visit Diabetes, Other  [wound care supplies + low air flow mattress, transportation, wound care/facility/transportation follow up on all other concerns]  General Interventions   General Interventions Discussed/Reviewed General Interventions Reviewed, Horticulturist, commercial (DME), [employer], Doctor Visits, Communication with  Doctor Visits Discussed/Reviewed Doctor Visits Reviewed, PCP, Database administrator (DME) Other  [low flow air loss mattress, dakins [name] (with online search found can be purchased online or via [facility], [facility], [facility] average cost $20-24]  PCP/Specialist Visits Compliance with follow-up visit  Communication with --  Public house manager w/[name] [name] (RN) & [name] [name] ([facility]) to clarify & get low air loss mattress orders to [facility] via [employer]. Discussed w/[employer] [name] [name]-[name](Sodium Hypochlorite) [name] [name] confirmed not on formulary. [facility] wound care supplys+OTC-wife aware]  Education Interventions   Education Provided Provided Education  [[name]'s [name]- updated that [name] [name] or [name] [name] can help with Dakins supply as needed- She is to call [facility] wound care center Discussed alternative purchase @[facility] , [facility], [facility] $20-24]  Provided Verbal Education On Other  Mental Health Interventions   Mental Health Discussed/Reviewed Mental Health Reviewed, Coping Strategies  Pharmacy Interventions   Pharmacy Dicussed/Reviewed Pharmacy Topics Reviewed, Affording Medications              SDOH assessments and interventions completed:  No     Care Coordination Interventions:  Yes, provided   Follow up plan: Follow up call scheduled for 02/05/23    Encounter Outcome:  Patient Visit Completed   [name] [name] L. Noelle Penner, RN, BSN, [facility]  VBCI Care Management Coordinator  [phone]  Fax: [phone]

## 2023-01-04 NOTE — Patient Instructions (Addendum)
Visit Information  Thank you for taking time to visit with me today. Please don't hesitate to contact me if I can be of assistance to you.   Following are the goals we discussed today:   Goals Addressed             This Visit's Progress    Obtain wound care suplies, Dakins solutions, air mattress- care coordination services   On track    Patient's wife confirmed transportation resources have been provided- 01/04/23 Resolved recently used [facility] -access to [facility] after holidays Obtain wound care center appointment + new cost efficient cream, prescription renewal & air mattress -01/04/23 discontinued cream & air mattress still pending, [facility] center visit completed  & endocrinology appointment rescheduled to 01/15/23 Will have access to Dakins solution for wound care as needed from wound care center or local drug store ([facility], [facility], [facility])- 01/04/23 doing well at this time and aware of resources to obtain Dakins  Interventions Today    Flowsheet Row Most Recent Value  Chronic Disease   Chronic disease during today's visit Diabetes, Other  [wound care supplies + low air flow mattress, transportation, wound care/facility/transportation follow up on all other concerns]  General Interventions   General Interventions Discussed/Reviewed General Interventions Reviewed, Horticulturist, commercial (DME), [facility], Doctor Visits, Communication with  Doctor Visits Discussed/Reviewed Doctor Visits Reviewed, PCP, Database administrator (DME) Other  [low flow air loss mattress, dakins solution (with online search found can be purchased online or via [facility], [facility], [facility] average cost $20-24]  PCP/Specialist Visits Compliance with follow-up visit  Communication with --  Public house manager w/Mary Erskine Squibb (RN) & Cala Bradford ([facility]) to clarify & get low air loss mattress orders to [facility] via [employer]. Discussed w/[employer] Jenna/Angela-Dakin(Sodium Hypochlorite) Marylene Land confirmed not on  formulary. [facility] wound care supplys+OTC-wife aware]  Education Interventions   Education Provided Provided Education  [Dakin's solution- updated that Selena Batten or Angelique Blonder can help with Dakins supply as needed- She is to call [facility] wound care center Discussed alternative purchase @[facility] , [facility], [facility] $20-24]  Provided Verbal Education On Other  Mental Health Interventions   Mental Health Discussed/Reviewed Mental Health Reviewed, Coping Strategies  Pharmacy Interventions   Pharmacy Dicussed/Reviewed Pharmacy Topics Reviewed, Affording Medications              Our next appointment is by telephone on 02/05/23 at 1000  Please call the care guide team at [phone] if you need to cancel or reschedule your appointment.   If you are experiencing a Mental Health or Behavioral Health Crisis or need someone to talk to, please call the Suicide and Crisis Lifeline: 988 call the [place] National Suicide Prevention Lifeline: (780)042-0479 or TTY: (785) 215-3536 TTY 939 123 7271) to talk to a trained counselor call 1-800-273-TALK (toll free, 24 hour hotline) call the [facility]: [phone] call 911   No computer access, no preference for copy of AVS     The patient has been provided with contact information for the care management team and has been advised to call with any health related questions or concerns.   Jamariyah Johannsen L. Noelle Penner, RN, BSN, [facility]  VBCI Care Management Coordinator  [phone]  Fax: [phone]

## 2023-01-07 ENCOUNTER — Ambulatory Visit: Payer: PPO | Admitting: Nurse Practitioner

## 2023-01-08 DIAGNOSIS — L89154 Pressure ulcer of sacral region, stage 4: Secondary | ICD-10-CM | POA: Diagnosis not present

## 2023-01-11 ENCOUNTER — Other Ambulatory Visit: Payer: Self-pay

## 2023-01-11 ENCOUNTER — Emergency Department (HOSPITAL_COMMUNITY): Payer: PPO

## 2023-01-11 ENCOUNTER — Encounter (HOSPITAL_COMMUNITY): Payer: Self-pay | Admitting: Internal Medicine

## 2023-01-11 ENCOUNTER — Inpatient Hospital Stay (HOSPITAL_COMMUNITY)
Admission: EM | Admit: 2023-01-11 | Discharge: 2023-01-12 | DRG: 698 | Disposition: A | Discharge status: Hospice | Payer: PPO | Attending: Internal Medicine | Admitting: Internal Medicine

## 2023-01-11 DIAGNOSIS — Z66 Do not resuscitate: Secondary | ICD-10-CM | POA: Diagnosis not present

## 2023-01-11 DIAGNOSIS — Y846 Urinary catheterization as the cause of abnormal reaction of the patient, or of later complication, without mention of misadventure at the time of the procedure: Secondary | ICD-10-CM | POA: Diagnosis present

## 2023-01-11 DIAGNOSIS — Z809 Family history of malignant neoplasm, unspecified: Secondary | ICD-10-CM

## 2023-01-11 DIAGNOSIS — L89620 Pressure ulcer of left heel, unstageable: Secondary | ICD-10-CM | POA: Diagnosis present

## 2023-01-11 DIAGNOSIS — A419 Sepsis, unspecified organism: Secondary | ICD-10-CM | POA: Diagnosis present

## 2023-01-11 DIAGNOSIS — Z833 Family history of diabetes mellitus: Secondary | ICD-10-CM

## 2023-01-11 DIAGNOSIS — I10 Essential (primary) hypertension: Secondary | ICD-10-CM | POA: Diagnosis not present

## 2023-01-11 DIAGNOSIS — L89892 Pressure ulcer of other site, stage 2: Secondary | ICD-10-CM | POA: Diagnosis not present

## 2023-01-11 DIAGNOSIS — Z888 Allergy status to other drugs, medicaments and biological substances status: Secondary | ICD-10-CM

## 2023-01-11 DIAGNOSIS — G309 Alzheimer's disease, unspecified: Secondary | ICD-10-CM | POA: Diagnosis not present

## 2023-01-11 DIAGNOSIS — Z7189 Other specified counseling: Secondary | ICD-10-CM

## 2023-01-11 DIAGNOSIS — E872 Acidosis, unspecified: Secondary | ICD-10-CM | POA: Diagnosis not present

## 2023-01-11 DIAGNOSIS — N401 Enlarged prostate with lower urinary tract symptoms: Secondary | ICD-10-CM | POA: Diagnosis present

## 2023-01-11 DIAGNOSIS — L89610 Pressure ulcer of right heel, unstageable: Secondary | ICD-10-CM | POA: Diagnosis present

## 2023-01-11 DIAGNOSIS — E119 Type 2 diabetes mellitus without complications: Secondary | ICD-10-CM | POA: Diagnosis not present

## 2023-01-11 DIAGNOSIS — E785 Hyperlipidemia, unspecified: Secondary | ICD-10-CM | POA: Diagnosis present

## 2023-01-11 DIAGNOSIS — G9341 Metabolic encephalopathy: Secondary | ICD-10-CM | POA: Diagnosis not present

## 2023-01-11 DIAGNOSIS — R6520 Severe sepsis without septic shock: Secondary | ICD-10-CM | POA: Diagnosis not present

## 2023-01-11 DIAGNOSIS — T83511A Infection and inflammatory reaction due to indwelling urethral catheter, initial encounter: Secondary | ICD-10-CM | POA: Diagnosis not present

## 2023-01-11 DIAGNOSIS — Z681 Body mass index (BMI) 19 or less, adult: Secondary | ICD-10-CM

## 2023-01-11 DIAGNOSIS — R680 Hypothermia, not associated with low environmental temperature: Secondary | ICD-10-CM | POA: Diagnosis present

## 2023-01-11 DIAGNOSIS — N179 Acute kidney failure, unspecified: Secondary | ICD-10-CM | POA: Diagnosis not present

## 2023-01-11 DIAGNOSIS — Z808 Family history of malignant neoplasm of other organs or systems: Secondary | ICD-10-CM

## 2023-01-11 DIAGNOSIS — R000 Tachycardia, unspecified: Secondary | ICD-10-CM | POA: Diagnosis not present

## 2023-01-11 DIAGNOSIS — E1165 Type 2 diabetes mellitus with hyperglycemia: Secondary | ICD-10-CM | POA: Diagnosis not present

## 2023-01-11 DIAGNOSIS — E860 Dehydration: Secondary | ICD-10-CM | POA: Diagnosis present

## 2023-01-11 DIAGNOSIS — Z1152 Encounter for screening for COVID-19: Secondary | ICD-10-CM

## 2023-01-11 DIAGNOSIS — Z860100 Personal history of colon polyps, unspecified: Secondary | ICD-10-CM

## 2023-01-11 DIAGNOSIS — R54 Age-related physical debility: Secondary | ICD-10-CM | POA: Diagnosis present

## 2023-01-11 DIAGNOSIS — Z515 Encounter for palliative care: Secondary | ICD-10-CM | POA: Diagnosis not present

## 2023-01-11 DIAGNOSIS — Z794 Long term (current) use of insulin: Secondary | ICD-10-CM | POA: Diagnosis not present

## 2023-01-11 DIAGNOSIS — N390 Urinary tract infection, site not specified: Secondary | ICD-10-CM | POA: Diagnosis present

## 2023-01-11 DIAGNOSIS — Z87440 Personal history of urinary (tract) infections: Secondary | ICD-10-CM

## 2023-01-11 DIAGNOSIS — Z7984 Long term (current) use of oral hypoglycemic drugs: Secondary | ICD-10-CM

## 2023-01-11 DIAGNOSIS — K219 Gastro-esophageal reflux disease without esophagitis: Secondary | ICD-10-CM | POA: Diagnosis present

## 2023-01-11 DIAGNOSIS — Z79899 Other long term (current) drug therapy: Secondary | ICD-10-CM

## 2023-01-11 DIAGNOSIS — Z96641 Presence of right artificial hip joint: Secondary | ICD-10-CM | POA: Diagnosis present

## 2023-01-11 DIAGNOSIS — R4182 Altered mental status, unspecified: Secondary | ICD-10-CM | POA: Diagnosis not present

## 2023-01-11 DIAGNOSIS — R338 Other retention of urine: Secondary | ICD-10-CM | POA: Diagnosis present

## 2023-01-11 DIAGNOSIS — R64 Cachexia: Secondary | ICD-10-CM | POA: Diagnosis not present

## 2023-01-11 DIAGNOSIS — R0902 Hypoxemia: Secondary | ICD-10-CM | POA: Diagnosis not present

## 2023-01-11 DIAGNOSIS — R404 Transient alteration of awareness: Secondary | ICD-10-CM | POA: Diagnosis not present

## 2023-01-11 DIAGNOSIS — Z87891 Personal history of nicotine dependence: Secondary | ICD-10-CM

## 2023-01-11 DIAGNOSIS — L89154 Pressure ulcer of sacral region, stage 4: Secondary | ICD-10-CM | POA: Diagnosis present

## 2023-01-11 DIAGNOSIS — R7989 Other specified abnormal findings of blood chemistry: Secondary | ICD-10-CM | POA: Diagnosis present

## 2023-01-11 DIAGNOSIS — E870 Hyperosmolality and hypernatremia: Principal | ICD-10-CM | POA: Diagnosis present

## 2023-01-11 DIAGNOSIS — F0280 Dementia in other diseases classified elsewhere without behavioral disturbance: Secondary | ICD-10-CM | POA: Diagnosis not present

## 2023-01-11 DIAGNOSIS — L89320 Pressure ulcer of left buttock, unstageable: Secondary | ICD-10-CM | POA: Diagnosis not present

## 2023-01-11 DIAGNOSIS — I700 Atherosclerosis of aorta: Secondary | ICD-10-CM | POA: Diagnosis not present

## 2023-01-11 DIAGNOSIS — Z85828 Personal history of other malignant neoplasm of skin: Secondary | ICD-10-CM

## 2023-01-11 DIAGNOSIS — Z89022 Acquired absence of left finger(s): Secondary | ICD-10-CM

## 2023-01-11 DIAGNOSIS — I959 Hypotension, unspecified: Secondary | ICD-10-CM | POA: Diagnosis not present

## 2023-01-11 DIAGNOSIS — Z7401 Bed confinement status: Secondary | ICD-10-CM

## 2023-01-11 DIAGNOSIS — Z83438 Family history of other disorder of lipoprotein metabolism and other lipidemia: Secondary | ICD-10-CM

## 2023-01-11 DIAGNOSIS — R739 Hyperglycemia, unspecified: Secondary | ICD-10-CM | POA: Diagnosis not present

## 2023-01-11 DIAGNOSIS — Z8249 Family history of ischemic heart disease and other diseases of the circulatory system: Secondary | ICD-10-CM

## 2023-01-11 LAB — COMPREHENSIVE METABOLIC PANEL
ALT: 31 U/L (ref 0–44)
AST: 31 U/L (ref 15–41)
Albumin: 2 g/dL — ABNORMAL LOW (ref 3.5–5.0)
Alkaline Phosphatase: 93 U/L (ref 38–126)
Anion gap: 14 (ref 5–15)
BUN: 79 mg/dL — ABNORMAL HIGH (ref 8–23)
CO2: 28 mmol/L (ref 22–32)
Calcium: 10.3 mg/dL (ref 8.9–10.3)
Chloride: 125 mmol/L — ABNORMAL HIGH (ref 98–111)
Creatinine, Ser: 1.39 mg/dL — ABNORMAL HIGH (ref 0.61–1.24)
GFR, Estimated: 51 mL/min — ABNORMAL LOW (ref >60)
Glucose, Bld: 234 mg/dL — ABNORMAL HIGH (ref 70–99)
Potassium: 3.3 mmol/L — ABNORMAL LOW (ref 3.5–5.1)
Sodium: 167 mmol/L (ref 135–145)
Total Bilirubin: 0.4 mg/dL (ref <1.2)
Total Protein: 6.9 g/dL (ref 6.5–8.1)

## 2023-01-11 LAB — BASIC METABOLIC PANEL
Anion gap: 14 (ref 5–15)
BUN: 72 mg/dL — ABNORMAL HIGH (ref 8–23)
CO2: 24 mmol/L (ref 22–32)
Calcium: 9 mg/dL (ref 8.9–10.3)
Chloride: 126 mmol/L — ABNORMAL HIGH (ref 98–111)
Creatinine, Ser: 1.27 mg/dL — ABNORMAL HIGH (ref 0.61–1.24)
GFR, Estimated: 57 mL/min — ABNORMAL LOW (ref >60)
Glucose, Bld: 285 mg/dL — ABNORMAL HIGH (ref 70–99)
Potassium: 3.4 mmol/L — ABNORMAL LOW (ref 3.5–5.1)
Sodium: 164 mmol/L (ref 135–145)

## 2023-01-11 LAB — URINALYSIS, W/ REFLEX TO CULTURE (INFECTION SUSPECTED)
Bilirubin Urine: NEGATIVE
Glucose, UA: 50 mg/dL — AB
Ketones, ur: NEGATIVE mg/dL
Nitrite: NEGATIVE
Protein, ur: 100 mg/dL — AB
Specific Gravity, Urine: 1.017 (ref 1.005–1.030)
WBC, UA: >50 WBC/hpf (ref 0–5)
pH: 6 (ref 5.0–8.0)

## 2023-01-11 LAB — CBC WITH DIFFERENTIAL/PLATELET
Abs Immature Granulocytes: 0.17 10*3/uL — ABNORMAL HIGH (ref 0.00–0.07)
Basophils Absolute: 0 10*3/uL (ref 0.0–0.1)
Basophils Relative: 0 %
Eosinophils Absolute: 0.5 10*3/uL (ref 0.0–0.5)
Eosinophils Relative: 2 %
HCT: 41.7 % (ref 39.0–52.0)
Hemoglobin: 11.8 g/dL — ABNORMAL LOW (ref 13.0–17.0)
Immature Granulocytes: 1 %
Lymphocytes Relative: 14 %
Lymphs Abs: 2.8 10*3/uL (ref 0.7–4.0)
MCH: 27.4 pg (ref 26.0–34.0)
MCHC: 28.3 g/dL — ABNORMAL LOW (ref 30.0–36.0)
MCV: 97 fL (ref 80.0–100.0)
Monocytes Absolute: 0.5 10*3/uL (ref 0.1–1.0)
Monocytes Relative: 3 %
Neutro Abs: 16.5 10*3/uL — ABNORMAL HIGH (ref 1.7–7.7)
Neutrophils Relative %: 80 %
Platelets: 241 10*3/uL (ref 150–400)
RBC: 4.3 MIL/uL (ref 4.22–5.81)
RDW: 16.8 % — ABNORMAL HIGH (ref 11.5–15.5)
WBC: 20.4 10*3/uL — ABNORMAL HIGH (ref 4.0–10.5)
nRBC: 0.5 % — ABNORMAL HIGH (ref 0.0–0.2)

## 2023-01-11 LAB — PROTIME-INR
INR: 1.2 (ref 0.8–1.2)
Prothrombin Time: 15.9 s — ABNORMAL HIGH (ref 11.4–15.2)

## 2023-01-11 LAB — MAGNESIUM: Magnesium: 3 mg/dL — ABNORMAL HIGH (ref 1.7–2.4)

## 2023-01-11 LAB — LACTIC ACID, PLASMA
Lactic Acid, Venous: 3.2 mmol/L (ref 0.5–1.9)
Lactic Acid, Venous: 3.6 mmol/L (ref 0.5–1.9)
Lactic Acid, Venous: 3.8 mmol/L (ref 0.5–1.9)
Lactic Acid, Venous: 3.6 mmol/L (ref 0.5–1.9)

## 2023-01-11 LAB — TROPONIN I (HIGH SENSITIVITY): Troponin I (High Sensitivity): 188 ng/L (ref <18)

## 2023-01-11 LAB — RESP PANEL BY RT-PCR (RSV, FLU A&B, COVID)  RVPGX2
Influenza A by PCR: NEGATIVE
Influenza B by PCR: NEGATIVE
Resp Syncytial Virus by PCR: NEGATIVE
SARS Coronavirus 2 by RT PCR: NEGATIVE

## 2023-01-11 LAB — MRSA NEXT GEN BY PCR, NASAL: MRSA by PCR Next Gen: NOT DETECTED

## 2023-01-11 LAB — APTT: aPTT: 25 s (ref 24–36)

## 2023-01-11 MED ORDER — SODIUM CHLORIDE 0.9 % IV BOLUS
500.0000 mL | Freq: Once | INTRAVENOUS | Status: AC
  Administered 2023-01-11: 500 mL via INTRAVENOUS

## 2023-01-11 MED ORDER — VANCOMYCIN HCL IN DEXTROSE 1-5 GM/200ML-% IV SOLN
1000.0000 mg | Freq: Once | INTRAVENOUS | Status: AC
  Administered 2023-01-11: 1000 mg via INTRAVENOUS
  Filled 2023-01-11: qty 200

## 2023-01-11 MED ORDER — ACETAMINOPHEN 325 MG PO TABS
650.0000 mg | ORAL_TABLET | Freq: Four times a day (QID) | ORAL | Status: DC | PRN
Start: 2023-01-11 — End: 2023-01-12

## 2023-01-11 MED ORDER — DEXTROSE 5 % IV SOLN: INTRAVENOUS | Status: DC

## 2023-01-11 MED ORDER — SODIUM CHLORIDE 0.9 % IV SOLN
2.0000 g | Freq: Once | INTRAVENOUS | Status: AC
  Administered 2023-01-11: 2 g via INTRAVENOUS
  Filled 2023-01-11: qty 20

## 2023-01-11 MED ORDER — SODIUM CHLORIDE 0.9 % IV BOLUS
1000.0000 mL | Freq: Once | INTRAVENOUS | Status: AC
  Administered 2023-01-11: 1000 mL via INTRAVENOUS

## 2023-01-11 MED ORDER — CHLORHEXIDINE GLUCONATE CLOTH 2 % EX PADS
6.0000 | MEDICATED_PAD | Freq: Every day | CUTANEOUS | Status: DC
Start: 2023-01-12 — End: 2023-01-12
  Administered 2023-01-12: 6 via TOPICAL

## 2023-01-11 MED ORDER — ONDANSETRON HCL 4 MG/2ML IJ SOLN: 4.0000 mg | Freq: Four times a day (QID) | INTRAMUSCULAR | Status: DC | PRN

## 2023-01-11 MED ORDER — PIPERACILLIN-TAZOBACTAM 3.375 G IVPB 30 MIN
3.3750 g | Freq: Two times a day (BID) | INTRAVENOUS | Status: DC
Start: 2023-01-11 — End: 2023-01-12
  Administered 2023-01-11: 3.375 g via INTRAVENOUS
  Filled 2023-01-11: qty 50

## 2023-01-11 MED ORDER — PIPERACILLIN-TAZOBACTAM 3.375 G IVPB 30 MIN
3.3750 g | Freq: Once | INTRAVENOUS | Status: AC
  Administered 2023-01-11: 3.375 g via INTRAVENOUS
  Filled 2023-01-11: qty 50

## 2023-01-11 MED ORDER — SODIUM CHLORIDE 0.9 % IV BOLUS
750.0000 mL | Freq: Once | INTRAVENOUS | Status: AC
  Administered 2023-01-11: 750 mL via INTRAVENOUS

## 2023-01-11 MED ORDER — ACETAMINOPHEN 650 MG RE SUPP: 650.0000 mg | Freq: Four times a day (QID) | RECTAL | Status: DC | PRN

## 2023-01-11 MED ORDER — VANCOMYCIN HCL IN DEXTROSE 1-5 GM/200ML-% IV SOLN
1000.0000 mg | INTRAVENOUS | Status: DC
  Filled 2023-01-11: qty 200

## 2023-01-11 MED ORDER — ONDANSETRON HCL 4 MG PO TABS: 4.0000 mg | ORAL_TABLET | Freq: Four times a day (QID) | ORAL | Status: DC | PRN

## 2023-01-11 MED ORDER — HEPARIN SODIUM (PORCINE) 5000 UNIT/ML IJ SOLN
5000.0000 [IU] | Freq: Three times a day (TID) | INTRAMUSCULAR | Status: DC
  Administered 2023-01-11 – 2023-01-12 (×2): 5000 [IU] via SUBCUTANEOUS
  Filled 2023-01-11 (×2): qty 1

## 2023-01-11 MED ORDER — LACTATED RINGERS IV SOLN
INTRAVENOUS | Status: AC
Start: 2023-01-11 — End: 2023-01-11

## 2023-01-11 NOTE — ED Notes (Signed)
Only one set of blood cultures was obtained before antibiotic administration, order for "do not delay antibiotics" placed by MD

## 2023-01-11 NOTE — ED Notes (Signed)
MD made aware of pts low BP of 87/57

## 2023-01-11 NOTE — Sepsis Progress Note (Signed)
Notified bedside nurse of need to draw repeat lactic acid (#3). 

## 2023-01-11 NOTE — Sepsis Progress Note (Signed)
Notified bedside nurse of need to draw repeat lactic acid. 
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
## 2023-01-11 NOTE — Progress Notes (Signed)
Patient to ICU with foley catheter in place from home. Verbal order from Dr. Gwenlyn Perking to change it out. Catheter from home removed. 14Fr foley catheter placed.

## 2023-01-11 NOTE — ED Notes (Signed)
Pt arrived with a foley catheter in place

## 2023-01-11 NOTE — ED Notes (Addendum)
MD made aware of temperature of 96.3 F, [place], MD, verbal to place warm blankets on pt until he gets upstairs, warm blankets placed on pt
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
## 2023-01-11 NOTE — ED Triage Notes (Signed)
Pt from home via [facility] with reports of weakness and unable to eat. Pts wife reports she has been pureeing pts food and he has been holding it in his mouth then choking. Pt on NRB mask on arrival with minimal response.

## 2023-01-11 NOTE — ED Provider Notes (Signed)
[name] EMERGENCY DEPARTMENT AT [facility] Provider Note   CSN: [id number] Arrival date & time: 01/11/23  4098     History  No chief complaint on file.   [name] [name] is a 82 y.o. male.  HPI   This patient is a chronically ill 82 year old male who is nonambulatory for the last 6 weeks in bed only, he has dementia, he usually is able to talk and assist with eating, they have had to do some specific nutritional changes with his diet because he is not chewing food very well anymore.  Yesterday he started to have some significant difficulty chewing and swallowing, today he was extremely and severely weak and found to be hypoxic by paramedics.  The patient is not able to answer any questions.  I have reviewed the medical record and the patient was admitted to the hospital in October, he had been seen and evaluated and admitted for a urinary tract infection, severe weakness, he was admitted in September for the same thing, twice.  He was admitted for a fracture of his right hip in August, he was seen about 10 days ago at [facility] and had debridement of decubitus ulcer during which time he was on a ventilator.  He was extubated and went home the same day.  Home Medications Prior to Admission medications   Medication Sig Start Date End Date Taking? Authorizing Provider  acetaminophen (TYLENOL) 325 MG tablet Take 2 tablets (650 mg total) by mouth every 8 (eight) hours as needed for mild pain or moderate pain. 09/03/22   Montez Morita, PA-C  acidophilus (RISAQUAD) CAPS capsule Take 2 capsules by mouth daily. 11/03/22   Joseph Art, DO  B-D ULTRAFINE III SHORT PEN 31G X 8 MM MISC USE TO INJECT INSULIN EVERY DAY 05/26/22   Donita Brooks, MD  Blood Glucose Monitoring Suppl [facility] VERIO) w/Device KIT USE TO TEST BLOOD SUGAR LEVELS THREE TIMES DAILY 05/13/15   Dorena Bodo, PA-C  Cholecalciferol (DIALYVITE VITAMIN D 5000) 125 MCG (5000 UT) capsule Take 5,000 Units by  mouth daily.    [provider]  collagenase (SANTYL) 250 UNIT/GM ointment Apply 1 Application topically daily. 12/22/22   Donita Brooks, MD  Continuous Glucose Sensor (DEXCOM G7 SENSOR) MISC Inject 1 Application into the skin as directed. Change sensor every 10 days as directed. 06/17/22   Dani Gobble, NP  donepezil (ARICEPT) 10 MG tablet TAKE 1 TABLET(10 MG) BY MOUTH AT BEDTIME Patient taking differently: Take 10 mg by mouth at bedtime. 04/14/22   Donita Brooks, MD  feeding supplement (BOOST HIGH PROTEIN) LIQD Take 1 Container by mouth 2 (two) times daily between meals.    [provider]  finasteride (PROSCAR) 5 MG tablet Take 1 tablet (5 mg total) by mouth daily. 10/19/22   Jerilee Field, MD  HYDROcodone-acetaminophen (NORCO) 5-325 MG tablet Take 1 tablet by mouth every 6 (six) hours as needed for moderate pain (pain score 4-6). Ok to fill after 9/10 12/17/22   Donita Brooks, MD  insulin glargine (LANTUS SOLOSTAR) 100 UNIT/ML Solostar Pen Inject 8 Units into the skin daily. Patient taking differently: Inject 8 Units into the skin daily after supper. 09/25/22   Johnson, Clanford L, MD  Lancets (ONETOUCH DELICA PLUS LANCET33G) MISC USE TO CHECK BLOOD SUGAR TWICE DAILY. Dx: E11.9. 11/13/19   Donita Brooks, MD  losartan (COZAAR) 25 MG tablet TAKE 1 TABLET(25 MG) BY MOUTH DAILY 01/04/23   [name],  [name], MD  memantine (NAMENDA) 10 MG tablet TAKE 1 TABLET(10 MG) BY MOUTH TWICE DAILY 12/17/22   Donita Brooks, MD  nystatin (MYCOSTATIN/NYSTOP) powder Apply topically 2 (two) times daily. Apply to groin area and scrotum 09/25/22   [name], MD  [facility] VERIO test strip USE TO TEST BLOOD SUGAR TWO TO THREE TIMES DAILY 01/13/22   Donita Brooks, MD  pioglitazone (ACTOS) 45 MG tablet Take 1 tablet (45 mg total) by mouth daily. 06/17/22   Dani Gobble, NP  rosuvastatin (CRESTOR) 40 MG tablet TAKE 1 TABLET(40 MG) BY MOUTH DAILY Patient taking  differently: Take 40 mg by mouth daily. 05/11/22   Donita Brooks, MD  tamsulosin (FLOMAX) 0.4 MG CAPS capsule Take 1 capsule (0.4 mg total) by mouth daily after supper. 10/12/22   Vassie Loll, MD      Allergies    Invokana [canagliflozin] and Plavix [clopidogrel bisulfate]    Review of Systems   Review of Systems  Unable to perform ROS: Acuity of condition    Physical Exam Updated Vital Signs BP (!) 79/48   Pulse (!) 114   Resp (!) 33   SpO2 99%  Physical Exam Vitals and nursing note reviewed.  Constitutional:      General: He is in acute distress.     Appearance: He is well-developed. He is ill-appearing and toxic-appearing.     Comments: Somnolent  HENT:     Head: Normocephalic and atraumatic.     Mouth/Throat:     Mouth: Mucous membranes are dry.     Pharynx: No oropharyngeal exudate.  Eyes:     General: No scleral icterus.       Right eye: No discharge.        Left eye: No discharge.     Conjunctiva/sclera: Conjunctivae normal.     Pupils: Pupils are equal, round, and reactive to light.  Neck:     Thyroid: No thyromegaly.     Vascular: No JVD.  Cardiovascular:     Rate and Rhythm: Normal rate and regular rhythm.     Heart sounds: Normal heart sounds. No murmur heard.    No friction rub. No gallop.  Pulmonary:     Effort: Pulmonary effort is normal. No respiratory distress.     Breath sounds: Normal breath sounds. No wheezing or rales.  Abdominal:     General: Bowel sounds are normal. There is no distension.     Palpations: Abdomen is soft. There is no mass.     Tenderness: There is no abdominal tenderness.  Musculoskeletal:        General: No tenderness. Normal range of motion.     Cervical back: Normal range of motion and neck supple.     Right lower leg: No edema.     Left lower leg: No edema.  Lymphadenopathy:     Cervical: No cervical adenopathy.  Skin:    General: Skin is warm and dry.     Findings: No erythema or rash.     Comments: Multiple  areas of skin breakdown  Neurological:     Coordination: Coordination normal.     Comments: The patient is obtunded but responds to painful stimuli, seems to move all 4 extremities to painful stimuli but not intentionally.  He will not open his mouth for me, he does not look towards me when I have him look around the room  Psychiatric:        Behavior: Behavior normal.  ED Results / Procedures / Treatments   Labs (all labs ordered are listed, but only abnormal results are displayed) Labs Reviewed  LACTIC ACID, PLASMA - Abnormal; Notable for the following components:      Result Value   Lactic Acid, Venous 3.2 (*)    All other components within normal limits  COMPREHENSIVE METABOLIC PANEL - Abnormal; Notable for the following components:   Sodium 167 (*)    Potassium 3.3 (*)    Chloride 125 (*)    Glucose, Bld 234 (*)    BUN 79 (*)    Creatinine, Ser 1.39 (*)    Albumin 2.0 (*)    GFR, Estimated 51 (*)    All other components within normal limits  CBC WITH DIFFERENTIAL/PLATELET - Abnormal; Notable for the following components:   WBC 20.4 (*)    Hemoglobin 11.8 (*)    MCHC 28.3 (*)    RDW 16.8 (*)    nRBC 0.5 (*)    Neutro Abs 16.5 (*)    Abs Immature Granulocytes 0.17 (*)    All other components within normal limits  PROTIME-INR - Abnormal; Notable for the following components:   Prothrombin Time 15.9 (*)    All other components within normal limits  MAGNESIUM - Abnormal; Notable for the following components:   Magnesium 3.0 (*)    All other components within normal limits  TROPONIN I (HIGH SENSITIVITY) - Abnormal; Notable for the following components:   Troponin I (High Sensitivity) 188 (*)    All other components within normal limits  RESP PANEL BY RT-PCR (RSV, FLU A&B, COVID)  RVPGX2  CULTURE, BLOOD (ROUTINE X 2)  CULTURE, BLOOD (ROUTINE X 2)  APTT  LACTIC ACID, PLASMA  URINALYSIS, W/ REFLEX TO CULTURE (INFECTION SUSPECTED)    EKG EKG  Interpretation Date/Time:  Monday January 11 2023 09:10:06 EST Ventricular Rate:  121 PR Interval:  104 QRS Duration:  100 QT Interval:  365 QTC Calculation: 518 R Axis:   -70  Text Interpretation: Sinus tachycardia Probable left atrial enlargement Left anterior fascicular block Abnormal R-wave progression, late transition Prolonged QT interval Confirmed by Eber Hong ([id number]) on 01/11/2023 9:12:29 AM  Radiology DG Chest Port 1 View Result Date: 01/11/2023 CLINICAL DATA:  Possible sepsis.  Hypoxia. EXAM: PORTABLE CHEST 1 VIEW COMPARISON:  October 30, 2022. FINDINGS: The heart size and mediastinal contours are within normal limits. Both lungs are clear. Hyperexpansion of the lungs is noted. The visualized skeletal structures are unremarkable. IMPRESSION: No active disease. Aortic Atherosclerosis (ICD10-I70.0). Electronically Signed   By: Lupita Raider M.D.   On: 01/11/2023 10:04    Procedures .Critical Care  Performed by: Eber Hong, MD Authorized by: Eber Hong, MD   Critical care provider statement:    Critical care time (minutes):  45   Critical care time was exclusive of:  Separately billable procedures and treating other patients   Critical care was necessary to treat or prevent imminent or life-threatening deterioration of the following conditions:  Sepsis, shock and dehydration   Critical care was time spent personally by me on the following activities:  Development of treatment plan with patient or surrogate, discussions with consultants, evaluation of patient's response to treatment, examination of patient, obtaining history from patient or surrogate, review of old charts, re-evaluation of patient's condition, pulse oximetry, ordering and review of radiographic studies, ordering and review of laboratory studies and ordering and performing treatments and interventions   I assumed direction of critical care for this patient  from another provider in my specialty: no      Care discussed with: admitting provider   Comments:           Medications Ordered in ED Medications  lactated ringers infusion (0 mLs Intravenous Hold 01/11/23 0928)  piperacillin-tazobactam (ZOSYN) IVPB 3.375 g (has no administration in time range)  vancomycin (VANCOCIN) IVPB 1000 mg/200 mL premix (has no administration in time range)  cefTRIAXone (ROCEPHIN) 2 g in sodium chloride 0.9 % 100 mL IVPB (2 g Intravenous New Bag/Given 01/11/23 0934)  sodium chloride 0.9 % bolus 1,000 mL (1,000 mLs Intravenous Bolus 01/11/23 0934)  sodium chloride 0.9 % bolus 750 mL (750 mLs Intravenous Bolus 01/11/23 4782)    ED Course/ Medical Decision Making/ A&P Clinical Course as of 01/11/23 1013  Mon Jan 11, 2023  0944 Lactic acid over 3, sodium 167, leukocytosis of over 20,000, antibiotics are being given at this time [BM]    Clinical Course User Index [BM] Eber Hong, MD                                 Medical Decision Making Amount and/or Complexity of Data Reviewed Labs: ordered. Radiology: ordered.  Risk Prescription drug management.    This patient presents to the ED for concern of severe weakness and progressive change in mental status, this involves an extensive number of treatment options, and is a complaint that carries with it a high risk of complications and morbidity.  The differential diagnosis includes aspiration, infection, hyponatremia, hypernatremia, dehydration, stroke, cardiac event   Co morbidities that complicate the patient evaluation  The patient is chronically ill, severe, recurrent and worsening deconditioning, now not walking for 6 weeks   Additional history obtained:  Additional history obtained from medical record including prior admissions as well as the spouse External records from outside source obtained and reviewed including prior admission histories   Lab Tests:  I Ordered, and personally interpreted labs.  The pertinent results include:   Hypernatremia, AKI, Lactic acid elevated, high WBC   Imaging Studies ordered:  I ordered imaging studies including CXR  I independently visualized and interpreted imaging which showed inflammatory changes I agree with the radiologist interpretation   Cardiac Monitoring: / EKG:  The patient was maintained on a cardiac monitor.  I personally viewed and interpreted the cardiac monitored which showed an underlying rhythm of: Sinus Tachycardia   Consultations Obtained:  I requested consultation with the hospitalist,  and discussed lab and imaging findings as well as pertinent plan - they recommend: Admission, patient critically ill, may need to be made palliative   Problem List / ED Course / Critical interventions / Medication management  The patient is critically ill with what appears to be septic shock, acute kidney injury, severe hypernatremia I ordered medication including IV fluids and antibiotics for severe dehydration and shock Reevaluation of the patient after these medicines showed that the patient critically ill I have reviewed the patients home medicines and have made adjustments as needed   Social Determinants of Health:  Dementia, I did discuss the care with the patient's spouse, she does not want the patient to have any resuscitative efforts other than fluids and antibiotics   Test / Admission - Considered:  Admit to hospital         Final Clinical Impression(s) / ED Diagnoses Final diagnoses:  None    Rx / DC Orders ED Discharge Orders  None         Eber Hong, MD 01/11/23 [phone]

## 2023-01-11 NOTE — Sepsis Progress Note (Signed)
Elink monitoring for the code sepsis protocol.  

## 2023-01-11 NOTE — Progress Notes (Signed)
Date and time results received: 01/11/23 1207 (use smartphrase ".now" to insert current time)  Test: Lactic Acid Critical Value: 3.6  Name of Provider Notified: Dr. Gwenlyn Perking  Orders Received? Or Actions Taken?: MD notified. Awaiting Orders

## 2023-01-11 NOTE — Sepsis Progress Note (Signed)
Notified provider of need to order repeat lactic acid #3 [id number].

## 2023-01-12 DIAGNOSIS — E1165 Type 2 diabetes mellitus with hyperglycemia: Secondary | ICD-10-CM

## 2023-01-12 DIAGNOSIS — R6520 Severe sepsis without septic shock: Secondary | ICD-10-CM | POA: Diagnosis not present

## 2023-01-12 DIAGNOSIS — A419 Sepsis, unspecified organism: Secondary | ICD-10-CM | POA: Diagnosis not present

## 2023-01-12 LAB — CBC
HCT: 32.7 % — ABNORMAL LOW (ref 39.0–52.0)
Hemoglobin: 9.6 g/dL — ABNORMAL LOW (ref 13.0–17.0)
MCH: 27.5 pg (ref 26.0–34.0)
MCHC: 29.4 g/dL — ABNORMAL LOW (ref 30.0–36.0)
MCV: 93.7 fL (ref 80.0–100.0)
Platelets: 199 10*3/uL (ref 150–400)
RBC: 3.49 MIL/uL — ABNORMAL LOW (ref 4.22–5.81)
RDW: 17 % — ABNORMAL HIGH (ref 11.5–15.5)
WBC: 15 10*3/uL — ABNORMAL HIGH (ref 4.0–10.5)
nRBC: 0.8 % — ABNORMAL HIGH (ref 0.0–0.2)

## 2023-01-12 LAB — GLUCOSE, CAPILLARY
Glucose-Capillary: 469 mg/dL — ABNORMAL HIGH (ref 70–99)
Glucose-Capillary: 501 mg/dL (ref 70–99)
Glucose-Capillary: 540 mg/dL (ref 70–99)
Glucose-Capillary: 430 mg/dL — ABNORMAL HIGH (ref 70–99)

## 2023-01-12 LAB — LACTIC ACID, PLASMA: Lactic Acid, Venous: 1.6 mmol/L (ref 0.5–1.9)

## 2023-01-12 LAB — BLOOD GAS, VENOUS
Acid-Base Excess: 2.1 mmol/L — ABNORMAL HIGH (ref 0.0–2.0)
Bicarbonate: 28.7 mmol/L — ABNORMAL HIGH (ref 20.0–28.0)
Drawn by: 6509
O2 Saturation: 17.4 %
Patient temperature: 36.6
pCO2, Ven: 51 mm[Hg] (ref 44–60)
pH, Ven: 7.36 (ref 7.25–7.43)
pO2, Ven: <31 mm[Hg] — CL (ref 32–45)

## 2023-01-12 LAB — BASIC METABOLIC PANEL
Anion gap: 10 (ref 5–15)
Anion gap: 10 (ref 5–15)
BUN: 73 mg/dL — ABNORMAL HIGH (ref 8–23)
BUN: 75 mg/dL — ABNORMAL HIGH (ref 8–23)
CO2: 25 mmol/L (ref 22–32)
CO2: 26 mmol/L (ref 22–32)
Calcium: 8.8 mg/dL — ABNORMAL LOW (ref 8.9–10.3)
Calcium: 8.6 mg/dL — ABNORMAL LOW (ref 8.9–10.3)
Chloride: 123 mmol/L — ABNORMAL HIGH (ref 98–111)
Chloride: 122 mmol/L — ABNORMAL HIGH (ref 98–111)
Creatinine, Ser: 1.28 mg/dL — ABNORMAL HIGH (ref 0.61–1.24)
Creatinine, Ser: 1.38 mg/dL — ABNORMAL HIGH (ref 0.61–1.24)
GFR, Estimated: 56 mL/min — ABNORMAL LOW (ref >60)
GFR, Estimated: 51 mL/min — ABNORMAL LOW (ref >60)
Glucose, Bld: >1200 mg/dL (ref 70–99)
Glucose, Bld: >1200 mg/dL (ref 70–99)
Potassium: 2.9 mmol/L — ABNORMAL LOW (ref 3.5–5.1)
Potassium: 3.1 mmol/L — ABNORMAL LOW (ref 3.5–5.1)
Sodium: 158 mmol/L — ABNORMAL HIGH (ref 135–145)
Sodium: 158 mmol/L — ABNORMAL HIGH (ref 135–145)

## 2023-01-12 MED ORDER — LACTATED RINGERS IV SOLN: INTRAVENOUS | Status: DC

## 2023-01-12 MED ORDER — MORPHINE SULFATE (CONCENTRATE) 10 MG /0.5 ML PO SOLN
5.0000 mg | ORAL | Status: DC | PRN
  Administered 2023-01-12: 5 mg via ORAL
  Filled 2023-01-12: qty 0.5

## 2023-01-12 MED ORDER — POTASSIUM CHLORIDE 10 MEQ/100ML IV SOLN
10.0000 meq | INTRAVENOUS | Status: AC
  Administered 2023-01-12 (×3): 10 meq via INTRAVENOUS
  Filled 2023-01-12 (×2): qty 100

## 2023-01-12 MED ORDER — DEXTROSE 50 % IV SOLN: 0.0000 mL | INTRAVENOUS | Status: DC | PRN

## 2023-01-12 MED ORDER — INSULIN REGULAR(HUMAN) IN NACL 100-0.9 UT/100ML-% IV SOLN
INTRAVENOUS | Status: DC
  Administered 2023-01-12: 4.6 [IU]/h via INTRAVENOUS
  Filled 2023-01-12: qty 100

## 2023-01-12 MED ORDER — DEXTROSE IN LACTATED RINGERS 5 % IV SOLN: INTRAVENOUS | Status: DC

## 2023-01-12 MED ORDER — MORPHINE SULFATE (CONCENTRATE) 10 MG /0.5 ML PO SOLN: 5.0000 mg | ORAL | 0 refills | Status: DC | PRN

## 2023-01-13 LAB — HEMOGLOBIN A1C
Hgb A1c MFr Bld: 12 % — ABNORMAL HIGH (ref 4.8–5.6)
Mean Plasma Glucose: 298 mg/dL

## 2023-01-13 NOTE — Progress Notes (Signed)
 Palliative: [name] [name] is lying quietly in bed.  He appears acutely/chronically ill and very frail.  He does not interact with me in any meaningful way.  He clearly cannot make his basic needs known.  His wife of 30 years, [name] [name] and his son [name] [name] are present at bedside.  We talked at length about [name] [name] acute and chronic health concerns.  Family verifies that they would like to take him home with hospice care.  We talked about the benefits of hospice care, in particular in [name] [name] situation it would be to let nature take its course.  We talk about his infection which is recurrent UTI, his un-healable bedsores.  Although [name] [name] will at times speak about future, she also endorses that she is not sure that [name] [name] will see the new year.  Conference with attending, bedside nursing staff, transition of care team related to patient condition, needs, goals of care, disposition. [name] [name] experienced in-hospital death just after noon.  Plan: Requesting home with the benefits of hospice care.  Symptom management needs adjusted. Prognosis: Hours to days.  50 minutes  Lorenza Birkenhead, NP Palliative Medicine Team Team phone [phone]

## 2023-01-13 NOTE — Progress Notes (Signed)
 This RN responded to yelling from patient's room stating help me. Arrived to room to find patient agonal. Patient's wife had been feeding patient lunch and patient stopped breathing. Patient with agonal breaths and dropping heart rate. Dr. Ricky notified. Time of death 01/08/1201. Wife Angeline at bedside.

## 2023-01-13 NOTE — Death Summary Note (Signed)
 DEATH SUMMARY   Patient Details  Name: [name] [name] MRN: [id number] DOB: 1940/06/19 ERE:Eprxjmi, Butler DASEN, MD Admission/Discharge Information   Admit Date:  12/22/2022  Date of Death: Date of Death: January 13, 2023  Time of Death: Time of Death: Dec 19, 1200  Length of Stay: 1   Principle Cause of death: Severe sepsis in the setting of UTI (associated with chronic indwelling catheter) and aspiration.  Hospital Diagnoses: Principal Problem:   Severe sepsis [facility])   Brief Hospital admission course: MEIR ELWOOD is a 83 y.o. male with medical history significant of Alzheimer, gastroesophageal reflux disease, hypertension, hyperlipidemia, type 2 diabetes, BPH with urinary retention and chronic indwelling catheter; who has unfortunately experienced in the last 6 months or so and mechanical fall with hip fracture and has been bedridden since.  Patient has experienced multiple admissions secondary to sepsis due to UTI and most recently was seen at [facility] secondary to decubitus ulcer for surgical debridement; who was brought to the emergency department today secondary to altered mentation and hypoxia.   Patient significantly obtunded and unable to answer any questions; after discussing with wife at bedside having concerns for either another episode of UTI or possible aspiration; she expressed that he was trying to eat and drink and experiencing coughing spells.  Patient has not been the same for the last 48 hours or so.   Workup in the ED demonstrating a cachectic, chronically ill in appearance and only responsive to painful stimuli patient; nonrebreather in place, blood work with significant signs of dehydration, hypernatremia, elevated WBCs, hypothermia, elevated troponin, elevated lactic acid and hypotension.   Patient received fluid resuscitation as per sepsis protocol, cultures taken and broad-spectrum antibiotics initiated.  Chest x-ray not demonstrating acute infiltrates and urinalysis without  nitrite, but with positive leukocyte Esterace and rare bacteria.   Family not interested on any heroic measures or artificial interventions; looking to treat what is treatable and focusing on fluid resuscitation and antibiotics management.  Assessment and Plan: 1-severe sepsis -Presumed to be secondary to UTI (chronic indwelling catheter) and possible presumed aspiration -Catheter was exchanged -Patient received fluid resuscitation and was initiated on IV antibiotics -After further discussion and involvement on palliative care decision was made to transition to full code for symptomatic management only with intention to go home with hospice care -All medications not essential to keep patient comfortable and is stabilized were discontinued discharge -Initial symptomatic medications management as dictated by palliative care were prescribed. - [facility] to follow patient at discharge. -Prior to transportation being arrange while being fed by family members patient ended choking and developed agonal breathing/vasovagal response with subsequent arrest. -Patient ended up passing away peacefully at 12:02 PM.     2-metabolic encephalopathy in the setting of sepsis and electrolyte abnormalities (hypernatremia). -Improved with fluid resuscitation -Patient able to follow simple commands and moves limbs spontaneously, patient was eating/drinking some (no substantial to maintain adequate nutrition, but in of for family to feel comfortable taking him home and continue symptomatic management).   3-hypernatremia -As mentioned above in the setting of profound dehydration -Fluid resuscitation provided -Focusing on comfort for care and symptomatic management at discharge -Patient to continue receiving oral hydration by mouth as as he could.   4-acute kidney injury  -Secondary to prerenal azotemia -Some improvement in renal function appreciated after fluid resuscitation -Planning to pursuit  symptomatic management and comfort care only -No further labs anticipated.   5-Alzheimer -Continue supportive care -Aricept  and Namenda  to be resumed if  patient can take it orally.   6-history of type 2 diabetes -Family planning to continue as previously using son insulin  as required -continue to follow CBGs -Overall plan is for comfort care.   7-hypertension Hyperlipidemia -Antihypertensive agents and a statin has been discontinued -Continue symptomatic management only.   8-multiple pressure injuries -Present at time of admission -There was no significant drainage or foul smell -Continue local care as previously instructed and constant repositioning.   Procedures: See below for x-ray reports.  Consultations: Palliative care.  The results of significant diagnostics from this hospitalization (including imaging, microbiology, ancillary and laboratory) are listed below for reference.   Significant Diagnostic Studies: DG Chest Port 1 View Result Date: 01/11/2023 CLINICAL DATA:  Possible sepsis.  Hypoxia. EXAM: PORTABLE CHEST 1 VIEW COMPARISON:  October 30, 2022. FINDINGS: The heart size and mediastinal contours are within normal limits. Both lungs are clear. Hyperexpansion of the lungs is noted. The visualized skeletal structures are unremarkable. IMPRESSION: No active disease. Aortic Atherosclerosis (ICD10-I70.0). Electronically Signed   By: Lynwood Landy Raddle M.D.   On: 01/11/2023 10:04    Microbiology: Recent Results (from the past 240 hours)  Resp panel by RT-PCR (RSV, Flu A&B, Covid) Anterior Nasal Swab     Status: None   Collection Time: 01/11/23  9:05 AM   Specimen: Anterior Nasal Swab  Result Value Ref Range Status   SARS Coronavirus 2 by RT PCR NEGATIVE NEGATIVE Final    Comment: (NOTE) SARS-CoV-2 target nucleic acids are NOT DETECTED.  The SARS-CoV-2 RNA is generally detectable in upper respiratory specimens during the acute phase of infection. The  lowest concentration of SARS-CoV-2 viral copies this assay can detect is 138 copies/mL. A negative result does not preclude SARS-Cov-2 infection and should not be used as the sole basis for treatment or other patient management decisions. A negative result may occur with  improper specimen collection/handling, submission of specimen other than nasopharyngeal swab, presence of viral mutation(s) within the areas targeted by this assay, and inadequate number of viral copies(<138 copies/mL). A negative result must be combined with clinical observations, patient history, and epidemiological information. The expected result is Negative.  Fact Sheet for Patients:  bloggercourse.com  Fact Sheet for Healthcare Providers:  seriousbroker.it  This test is no t yet approved or cleared by the [place]  FDA and  has been authorized for detection and/or diagnosis of SARS-CoV-2 by FDA under an Emergency Use Authorization (EUA). This EUA will remain  in effect (meaning this test can be used) for the duration of the COVID-19 declaration under Section 564(b)(1) of the Act, 21 U.S.C.section 360bbb-3(b)(1), unless the authorization is terminated  or revoked sooner.       Influenza A by PCR NEGATIVE NEGATIVE Final   Influenza B by PCR NEGATIVE NEGATIVE Final    Comment: (NOTE) The Xpert Xpress SARS-CoV-2/FLU/RSV plus assay is intended as an aid in the diagnosis of influenza from Nasopharyngeal swab specimens and should not be used as a sole basis for treatment. Nasal washings and aspirates are unacceptable for Xpert Xpress SARS-CoV-2/FLU/RSV testing.  Fact Sheet for Patients: bloggercourse.com  Fact Sheet for Healthcare Providers: seriousbroker.it  This test is not yet approved or cleared by the [place]  FDA and has been authorized for detection and/or diagnosis of SARS-CoV-2 by FDA under  an Emergency Use Authorization (EUA). This EUA will remain in effect (meaning this test can be used) for the duration of the COVID-19 declaration under Section 564(b)(1) of the Act, 21 U.S.C. section 360bbb-3(b)(1),  unless the authorization is terminated or revoked.     Resp Syncytial Virus by PCR NEGATIVE NEGATIVE Final    Comment: (NOTE) Fact Sheet for Patients: bloggercourse.com  Fact Sheet for Healthcare Providers: seriousbroker.it  This test is not yet approved or cleared by the [place]  FDA and has been authorized for detection and/or diagnosis of SARS-CoV-2 by FDA under an Emergency Use Authorization (EUA). This EUA will remain in effect (meaning this test can be used) for the duration of the COVID-19 declaration under Section 564(b)(1) of the Act, 21 U.S.C. section 360bbb-3(b)(1), unless the authorization is terminated or revoked.  Performed at [facility], [address] [place], [place] [address]   Blood Culture (routine x 2)     Status: None (Preliminary result)   Collection Time: 01/11/23  9:30 AM   Specimen: BLOOD  Result Value Ref Range Status   Specimen Description BLOOD LEFT HAND  Final   Special Requests   Final    BOTTLES DRAWN AEROBIC AND ANAEROBIC Blood Culture results may not be optimal due to an inadequate volume of blood received in culture bottles   Culture   Final    NO GROWTH < 24 HOURS Performed at [facility], [address] [place], [place] [address]    Report Status PENDING  Incomplete  Blood Culture (routine x 2)     Status: None (Preliminary result)   Collection Time: 01/11/23 10:30 AM   Specimen: BLOOD  Result Value Ref Range Status   Specimen Description BLOOD BLOOD RIGHT ARM BOTTLES DRAWN AEROBIC ONLY  Final   Special Requests   Final    Blood Culture results may not be optimal due to an inadequate volume of blood received in culture bottles   Culture   Final    NO GROWTH < 24  HOURS Performed at [facility], [address] [place], [place] [address]    Report Status PENDING  Incomplete  MRSA Next Gen by PCR, Nasal     Status: None   Collection Time: 01/11/23 12:01 PM   Specimen: Nasal Mucosa; Nasal Swab  Result Value Ref Range Status   MRSA by PCR Next Gen NOT DETECTED NOT DETECTED Final    Comment: (NOTE) The GeneXpert MRSA Assay (FDA approved for NASAL specimens only), is one component of a comprehensive MRSA colonization surveillance program. It is not intended to diagnose MRSA infection nor to guide or monitor treatment for MRSA infections. Test performance is not FDA approved in patients less than 24 years old. Performed at [facility], [address], [place], [place] [address]     Time spent: 25 minutes  Signed: Eric Nunnery, MD

## 2023-01-13 NOTE — Progress Notes (Signed)
 Patient's wife upset that patient is on insulin  gtt and is not able to eat at this time. Patient's wife frustrated because without eating, patient won't survive. Educated on insulin  gtt and need to get blood sugars under control. Patient wife states that the patient is going home today with hospice. Palliative NP at bedside currently. Dr. Ricky has spoken with wife already and told wife he would be placing orders consistent with going home with hospice. Awaiting orders.

## 2023-01-13 NOTE — TOC Transition Note (Signed)
 Transition of Care [facility]) - Discharge Note   Patient Details  Name: ENOS MUHL MRN: [id number] Date of Birth: Jun 23, 1940  Transition of Care [facility]) CM/SW Contact:  Lucie Lunger, LCSWA Phone Number: January 13, 2023, 10:40 AM   Clinical Narrative:    Pt is high risk for readmission. CSW updated that pt and family have decided to return home with home hospice care through [facility]. CSW spoke to Epes with hospice who accepted the referral. CSW spoke to Tammy with [facility] to start insurance auth for EMS. CSW awaits insurance auth before EMS can be set up. CSW to complete med necessity and send to floor for RN. CSW to call for EMS. TOC singing off.   Final next level of care: Home w Hospice Care Barriers to Discharge: Barriers Resolved   Patient Goals and CMS Choice Patient states their goals for this hospitalization and ongoing recovery are:: return home with hospice CMS Medicare.gov Compare Post Acute Care list provided to:: Patient Represenative (must comment) Choice offered to / list presented to : Spouse, Adult Children      Discharge Placement                       Discharge Plan and Services Additional resources added to the After Visit Summary for                                       Social Drivers of Health (SDOH) Interventions SDOH Screenings   Food Insecurity: No Food Insecurity (01/11/2023)  Housing: Low Risk  (01/11/2023)  Transportation Needs: No Transportation Needs (01/11/2023)  Utilities: Not At Risk (01/11/2023)  Alcohol Screen: Low Risk  (11/05/2021)  Depression (PHQ2-9): High Risk (11/05/2021)  Financial Resource Strain: Low Risk  (11/05/2021)  Physical Activity: Insufficiently Active (11/05/2021)  Social Connections: Moderately Integrated (11/05/2021)  Stress: No Stress Concern Present (11/05/2021)  Tobacco Use: Medium Risk (01/11/2023)     Readmission Risk Interventions    01/13/23   10:40 AM 10/10/2022    4:39 PM  Readmission Risk  Prevention Plan  Transportation Screening Complete Complete  PCP or Specialist Appt within 3-5 Days  Complete  HRI or Home Care Consult  Complete  Social Work Consult for Recovery Care Planning/Counseling  Complete  Palliative Care Screening  Not Applicable  Medication Review [employer]) Complete Complete  HRI or Home Care Consult Complete   SW Recovery Care/Counseling Consult Complete   Palliative Care Screening Complete   Skilled Nursing Facility Not Applicable

## 2023-01-13 NOTE — Plan of Care (Signed)

## 2023-01-13 NOTE — Progress Notes (Signed)
 Critical Note  RN called due to patient's blood glucose at > 1,200, apparently was on D5W per RN, this was stopped.  At bedside, patient was confused and lethargic, however at baseline was not too different from current status per RN.     Latest Ref Rng & Units January 31, 2023    6:17 AM 01-31-23    4:53 AM 01/11/2023    3:28 PM  BMP  Glucose 70 - 99 mg/dL >8,799  >8,799  714   BUN 8 - 23 mg/dL 75  73  72   Creatinine 0.61 - 1.24 mg/dL 8.61  8.71  8.72   Sodium 135 - 145 mmol/L 158  158  164   Potassium 3.5 - 5.1 mmol/L 3.1  2.9  3.4   Chloride 98 - 111 mmol/L 122  123  126   CO2 22 - 32 mmol/L 26  25  24    Calcium  8.9 - 10.3 mg/dL 8.6  8.8  9.0    Potassium was noted to be low at 3.1, IV potassium chloride  40 mg ordered and patient was started on IV LR per hypoglycemia protocol.  Insulin  will be temporarily held pending improved potassium level prior to starting the IV insulin  drip per Endo tool.   Please refer to patient's H&P for details regarding the care of this patient  Critical time: 32 minutes   Critical care personally provided  managing the patient due to high probability of clinically significant and life threatening deterioration. This critical care time included obtaining a history; examining the patient, pulse oximetry; ordering and review of studies; arranging urgent treatment with development of a management plan; evaluation of patient's response of treatment; frequent reassessment; and discussions with other providers.  This critical care time was performed to assess and manage the high probability of imminent and life threatening deterioration that could result in multi-organ failure.

## 2023-01-13 NOTE — Discharge Summary (Signed)
 Physician Discharge Summary   Patient: [name] [name] MRN: [id number] DOB: April 27, 1940  Admit date:     01/11/2023  Discharge date: Feb 05, 2023  Discharge Physician: Eric Nunnery   PCP: Duanne Butler DASEN, MD   Recommendations at discharge:  Symptomatic management and end-of-life care. Home hospice arranged at discharge.  Discharge Diagnoses: Principal Problem:   Severe sepsis (HCC) Hypernatremia Metabolic encephalopathy Lactic acidosis Acute kidney injury Alzheimer disease History of hypertension/hyperlipidemia Multiple pressure injuries   Brief Hospital admission course: [name] [name] is a 83 y.o. male with medical history significant of Alzheimer, gastroesophageal reflux disease, hypertension, hyperlipidemia, type 2 diabetes, BPH with urinary retention and chronic indwelling catheter; who has unfortunately experienced in the last 6 months or so and mechanical fall with hip fracture and has been bedridden since.  Patient has experienced multiple admissions secondary to sepsis due to UTI and most recently was seen at [facility] secondary to decubitus ulcer for surgical debridement; who was brought to the emergency department today secondary to altered mentation and hypoxia.   Patient significantly obtunded and unable to answer any questions; after discussing with wife at bedside having concerns for either another episode of UTI or possible aspiration; she expressed that he was trying to eat and drink and experiencing coughing spells.  Patient has not been the same for the last 48 hours or so.   Workup in the ED demonstrating a cachectic, chronically ill in appearance and only responsive to painful stimuli patient; nonrebreather in place, blood work with significant signs of dehydration, hypernatremia, elevated WBCs, hypothermia, elevated troponin, elevated lactic acid and hypotension.   Patient received fluid resuscitation as per sepsis protocol, cultures taken and broad-spectrum  antibiotics initiated.  Chest x-ray not demonstrating acute infiltrates and urinalysis without nitrite, but with positive leukocyte Esterace and rare bacteria.   Family not interested on any heroic measures or artificial interventions; looking to treat what is treatable and focusing on fluid resuscitation and antibiotics management.  Assessment and Plan: 1-severe sepsis -Presumed to be secondary to UTI (chronic indwelling catheter). -Catheter was exchanged -Patient received fluid resuscitation and was initiated on IV antibiotics -After further discussion and involvement on palliative care decision was made to transition to full code for symptomatic management only with intention to go home with hospice care -All medications not essential to keep patient comfortable and is stabilized were discontinued discharge -Initial symptomatic medications management as dictated by palliative care were prescribed. [name] group to follow patient at discharge.  2-metabolic encephalopathy in the setting of sepsis and electrolyte abnormalities (hypernatremia). -Improved with fluid resuscitation -Patient able to follow simple commands and moves limbs spontaneously, patient was eating/drinking some (no substantial to maintain adequate nutrition, but in of for family to feel comfortable taking him home and continue symptomatic management).  3-hypernatremia -As mentioned above in the setting of profound dehydration -Fluid resuscitation provided -Focusing on comfort for care and symptomatic management at discharge -Patient to continue receiving oral hydration by mouth as as he could.  4-acute kidney injury  -Secondary to prerenal azotemia -Some improvement in renal function appreciated after fluid resuscitation -Planning to pursuit symptomatic management and comfort care only -No further labs anticipated.  5-Alzheimer -Continue supportive care -Aricept  and Namenda  to be resumed if patient can  take it orally.  6-history of type 2 diabetes -Family planning to continue as previously using son insulin  as required -continue to follow CBGs -Overall plan is for comfort care.  7-hypertension Hyperlipidemia -Antihypertensive agents and a statin has  been discontinued -Continue symptomatic management only.  8-multiple pressure injuries -Present at time of admission -There was no significant drainage or foul smell -Continue local care as previously instructed and constant repositioning.  Consultants: Palliative care Procedures performed: See below for x-ray reports. Disposition: Home with hospice. Diet recommendation: Dysphagia 1 diet/comfort feeding.  DISCHARGE MEDICATION: Allergies as of 2023/01/29       Reactions   Invokana [canagliflozin] Palpitations, Rash, Other (See Comments)   Rash, tachycardia,weight loss   Plavix  [clopidogrel  Bisulfate]    Upset stomach        Medication List     STOP taking these medications    Dialyvite Vitamin D  5000 125 MCG (5000 UT) capsule Generic drug: Cholecalciferol    HYDROcodone -acetaminophen  5-325 MG tablet Commonly known as: Norco   losartan  25 MG tablet Commonly known as: COZAAR    pioglitazone  45 MG tablet Commonly known as: ACTOS    rosuvastatin  40 MG tablet Commonly known as: CRESTOR        TAKE these medications    acetaminophen  325 MG tablet Commonly known as: TYLENOL  Take 2 tablets (650 mg total) by mouth every 8 (eight) hours as needed for mild pain or moderate pain.   acidophilus Caps capsule Take 2 capsules by mouth daily.   B-D ULTRAFINE III SHORT PEN 31G X 8 MM Misc Generic drug: Insulin  Pen Needle USE TO INJECT INSULIN  EVERY DAY   Dexcom G7 Sensor Misc Inject 1 Application into the skin as directed. Change sensor every 10 days as directed.   donepezil  10 MG tablet Commonly known as: ARICEPT  TAKE 1 TABLET(10 MG) BY MOUTH AT BEDTIME What changed: See the new instructions.   feeding supplement  Liqd Take 1 Container by mouth 2 (two) times daily between meals.   finasteride  5 MG tablet Commonly known as: PROSCAR  Take 1 tablet (5 mg total) by mouth daily.   Lantus  SoloStar 100 UNIT/ML Solostar Pen Generic drug: insulin  glargine Inject 8 Units into the skin daily. What changed:  how much to take when to take this   memantine  10 MG tablet Commonly known as: NAMENDA  TAKE 1 TABLET(10 MG) BY MOUTH TWICE DAILY   morphine  CONCENTRATE 10 mg / 0.5 ml concentrated solution Take 0.25 mLs (5 mg total) by mouth every 2 (two) hours as needed for moderate pain (pain score 4-6) (EOL care, increased RR/WOB).   OneTouch Delica Plus Lancet33G Misc USE TO CHECK BLOOD SUGAR TWICE DAILY. Dx: E11.9.   OneTouch Verio test strip Generic drug: glucose blood USE TO TEST BLOOD SUGAR TWO TO THREE TIMES DAILY   OneTouch Verio w/Device Kit USE TO TEST BLOOD SUGAR LEVELS THREE TIMES DAILY   Santyl  250 UNIT/GM ointment Generic drug: collagenase  Apply 1 Application topically daily.   tamsulosin  0.4 MG Caps capsule Commonly known as: FLOMAX  Take 1 capsule (0.4 mg total) by mouth daily after supper.               Discharge Care Instructions  (From admission, onward)           Start     Ordered   01-29-2023 0000  Discharge wound care:       Comments: Continue constant repositioning and follow-up prior to admission wound care instructions.   2023-01-29 1038            Follow-up Information     Duanne Butler DASEN, MD .   Specialty: [facility] Contact information: [address] [place] [address] [phone]  Discharge Exam: Filed Weights   01/11/23 1042 01/11/23 1213  Weight: 44.5 kg 43.5 kg   General exam: Alert, awake, oriented x 1; following simple commands, no requiring oxygen supplementation and with stable vital signs. Respiratory system: No wheezing, no crackles; positive rhonchi bilaterally.  No using oxygen  supplementation. Cardiovascular system:RRR.  No rubs or gallops. Gastrointestinal system: Abdomen is nondistended, soft and nontender. No organomegaly or masses felt. Normal bowel sounds heard. Central nervous system: Limited examination; moving 4 limbs spontaneously. Extremities: No cyanosis or clubbing. Skin: Multiple pressure injuries present at time of admission; stage IV sacral decubitus, stage II posterior right thigh, unstageable deep tendon injury in his left buttock and bilateral heels. Psychiatry: Judgement and insight appear impaired secondary to underlying dementia; flat affect.   Condition at discharge: Stable.  The results of significant diagnostics from this hospitalization (including imaging, microbiology, ancillary and laboratory) are listed below for reference.   Imaging Studies: DG Chest Port 1 View Result Date: 01/11/2023 CLINICAL DATA:  Possible sepsis.  Hypoxia. EXAM: PORTABLE CHEST 1 VIEW COMPARISON:  October 30, 2022. FINDINGS: The heart size and mediastinal contours are within normal limits. Both lungs are clear. Hyperexpansion of the lungs is noted. The visualized skeletal structures are unremarkable. IMPRESSION: No active disease. Aortic Atherosclerosis (ICD10-I70.0). Electronically Signed   By: Lynwood Landy Raddle M.D.   On: 01/11/2023 10:04    Microbiology: Results for orders placed or performed during the hospital encounter of 01/11/23  Resp panel by RT-PCR (RSV, Flu A&B, Covid) Anterior Nasal Swab     Status: None   Collection Time: 01/11/23  9:05 AM   Specimen: Anterior Nasal Swab  Result Value Ref Range Status   SARS Coronavirus 2 by RT PCR NEGATIVE NEGATIVE Final    Comment: (NOTE) SARS-CoV-2 target nucleic acids are NOT DETECTED.  The SARS-CoV-2 RNA is generally detectable in upper respiratory specimens during the acute phase of infection. The lowest concentration of SARS-CoV-2 viral copies this assay can detect is 138 copies/mL. A negative result does  not preclude SARS-Cov-2 infection and should not be used as the sole basis for treatment or other patient management decisions. A negative result may occur with  improper specimen collection/handling, submission of specimen other than nasopharyngeal swab, presence of viral mutation(s) within the areas targeted by this assay, and inadequate number of viral copies(<138 copies/mL). A negative result must be combined with clinical observations, patient history, and epidemiological information. The expected result is Negative.  Fact Sheet for Patients:  bloggercourse.com  Fact Sheet for Healthcare Providers:  seriousbroker.it  This test is no t yet approved or cleared by the [place]  FDA and  has been authorized for detection and/or diagnosis of SARS-CoV-2 by FDA under an Emergency Use Authorization (EUA). This EUA will remain  in effect (meaning this test can be used) for the duration of the COVID-19 declaration under Section 564(b)(1) of the Act, 21 U.S.C.section 360bbb-3(b)(1), unless the authorization is terminated  or revoked sooner.       Influenza A by PCR NEGATIVE NEGATIVE Final   Influenza B by PCR NEGATIVE NEGATIVE Final    Comment: (NOTE) The Xpert Xpress SARS-CoV-2/FLU/RSV plus assay is intended as an aid in the diagnosis of influenza from Nasopharyngeal swab specimens and should not be used as a sole basis for treatment. Nasal washings and aspirates are unacceptable for Xpert Xpress SARS-CoV-2/FLU/RSV testing.  Fact Sheet for Patients: bloggercourse.com  Fact Sheet for Healthcare Providers: seriousbroker.it  This test is not yet approved or cleared  by the [place]  FDA and has been authorized for detection and/or diagnosis of SARS-CoV-2 by FDA under an Emergency Use Authorization (EUA). This EUA will remain in effect (meaning this test can be used) for the  duration of the COVID-19 declaration under Section 564(b)(1) of the Act, 21 U.S.C. section 360bbb-3(b)(1), unless the authorization is terminated or revoked.     Resp Syncytial Virus by PCR NEGATIVE NEGATIVE Final    Comment: (NOTE) Fact Sheet for Patients: bloggercourse.com  Fact Sheet for Healthcare Providers: seriousbroker.it  This test is not yet approved or cleared by the [place]  FDA and has been authorized for detection and/or diagnosis of SARS-CoV-2 by FDA under an Emergency Use Authorization (EUA). This EUA will remain in effect (meaning this test can be used) for the duration of the COVID-19 declaration under Section 564(b)(1) of the Act, 21 U.S.C. section 360bbb-3(b)(1), unless the authorization is terminated or revoked.  Performed at [facility], [address], [place], [place] [address]   Blood Culture (routine x 2)     Status: None (Preliminary result)   Collection Time: 01/11/23  9:30 AM   Specimen: BLOOD  Result Value Ref Range Status   Specimen Description BLOOD LEFT HAND  Final   Special Requests   Final    BOTTLES DRAWN AEROBIC AND ANAEROBIC Blood Culture results may not be optimal due to an inadequate volume of blood received in culture bottles   Culture   Final    NO GROWTH < 24 HOURS Performed at [facility], [address], [place], [place] [address]    Report Status PENDING  Incomplete  Blood Culture (routine x 2)     Status: None (Preliminary result)   Collection Time: 01/11/23 10:30 AM   Specimen: BLOOD  Result Value Ref Range Status   Specimen Description BLOOD BLOOD RIGHT ARM BOTTLES DRAWN AEROBIC ONLY  Final   Special Requests   Final    Blood Culture results may not be optimal due to an inadequate volume of blood received in culture bottles   Culture   Final    NO GROWTH < 24 HOURS Performed at [facility], [address] [place], [place] [address]    Report Status PENDING  Incomplete   MRSA Next Gen by PCR, Nasal     Status: None   Collection Time: 01/11/23 12:01 PM   Specimen: Nasal Mucosa; Nasal Swab  Result Value Ref Range Status   MRSA by PCR Next Gen NOT DETECTED NOT DETECTED Final    Comment: (NOTE) The GeneXpert MRSA Assay (FDA approved for NASAL specimens only), is one component of a comprehensive MRSA colonization surveillance program. It is not intended to diagnose MRSA infection nor to guide or monitor treatment for MRSA infections. Test performance is not FDA approved in patients less than 92 years old. Performed at [facility], [address] [place], [place] [address]     Labs: CBC: Recent Labs  Lab 01/11/23 [phone] January 31, 2023 0453  WBC 20.4* 15.0*  NEUTROABS 16.5*  --   HGB 11.8* 9.6*  HCT 41.7 32.7*  MCV 97.0 93.7  PLT 241 199   Basic Metabolic Panel: Recent Labs  Lab 01/11/23 0918 01/11/23 1528 31-Jan-2023 0453 2023/01/31 0617  NA 167* 164* 158* 158*  K 3.3* 3.4* 2.9* 3.1*  CL 125* 126* 123* 122*  CO2 28 24 25 26   GLUCOSE 234* 285* >1,200* >1,200*  BUN 79* 72* 73* 75*  CREATININE 1.39* 1.27* 1.28* 1.38*  CALCIUM  10.3 9.0 8.8* 8.6*  MG 3.0*  --   --   --    Liver Function Tests: Recent Labs  Lab 01/11/23 0918  AST 31  ALT 31  ALKPHOS 93  BILITOT 0.4  PROT 6.9  ALBUMIN 2.0*   CBG: Recent Labs  Lab 02/03/2023 0532 02/03/2023 0730 2023/02/03 0817 03-Feb-2023 0854  GLUCAP 469* 501* 540* 430*    Discharge time spent: greater than 30 minutes.  Signed: Eric Nunnery, MD [facility] 2023-02-03

## 2023-01-13 DEATH — deceased

## 2023-01-14 ENCOUNTER — Telehealth: Payer: Self-pay

## 2023-01-14 LAB — URINE CULTURE: Culture: >=100000 — AB

## 2023-01-14 NOTE — Telephone Encounter (Signed)
 Pt's wife called to advise that patient passed away on 04/07/23 13-Jan-2023. Thank you.

## 2023-01-16 LAB — CULTURE, BLOOD (ROUTINE X 2)
Culture: NO GROWTH
Culture: NO GROWTH

## 2023-01-20 ENCOUNTER — Ambulatory Visit: Payer: Self-pay | Admitting: *Deleted

## 2023-01-20 NOTE — Patient Outreach (Signed)
  Care Coordination   Case closure   Visit Note   01/20/2023 Name: [name] [name] MRN: [id number] DOB: [birth year]  [name] [name] is a 83 y.o. year old male who sees [name], Butler DASEN, MD for primary care. I  received a voice message from his wife on 01/15/23  What matters to the patients health and wellness today?  case closure passed on January 17, 2024 02/08/23 per message left by wife on 01/15/23     Goals Addressed             This Visit's Progress    Obtain wound care suplies, [employer], air mattress- care coordination services         Interventions Today    Flowsheet Row Most Recent Value  Chronic Disease   Chronic disease during today's visit Other  [case closure passed on January 17, 2024 02-08-23 per message left by wife on 01/15/23]              SDOH assessments and interventions completed:  No     Care Coordination Interventions:  Yes, provided   Follow up plan: No further intervention required.   Encounter Outcome:  Patient Visit Completed   Suzen L. Ramonita, RN, BSN, [facility]  VBCI Care Management Coordinator  [phone]  Fax: [phone]

## 2023-01-25 ENCOUNTER — Ambulatory Visit: Payer: PPO | Admitting: Nurse Practitioner

## 2023-02-05 ENCOUNTER — Encounter: Payer: PPO | Admitting: *Deleted

## 2023-03-16 ENCOUNTER — Ambulatory Visit: Payer: PPO | Admitting: Urology
